# Patient Record
Sex: Female | Born: 1945
Health system: Southern US, Academic
[De-identification: ages and names within clinical notes are randomized; demographics above are authoritative.]

## PROBLEM LIST (undated history)

## (undated) ENCOUNTER — Encounter: Attending: Nephrology | Primary: Nephrology

## (undated) ENCOUNTER — Inpatient Hospital Stay

## (undated) ENCOUNTER — Encounter

## (undated) ENCOUNTER — Ambulatory Visit

## (undated) ENCOUNTER — Ambulatory Visit: Payer: MEDICARE

## (undated) ENCOUNTER — Telehealth

## (undated) ENCOUNTER — Ambulatory Visit: Payer: MEDICARE | Attending: Nephrology | Primary: Nephrology

## (undated) ENCOUNTER — Encounter: Attending: Infectious Disease | Primary: Infectious Disease

## (undated) ENCOUNTER — Encounter: Attending: "Endocrinology | Primary: "Endocrinology

## (undated) ENCOUNTER — Ambulatory Visit: Attending: Infectious Disease | Primary: Infectious Disease

## (undated) ENCOUNTER — Telehealth
Attending: Student in an Organized Health Care Education/Training Program | Primary: Student in an Organized Health Care Education/Training Program

## (undated) ENCOUNTER — Encounter: Attending: Adult Health | Primary: Adult Health

## (undated) ENCOUNTER — Institutional Professional Consult (permissible substitution): Payer: MEDICARE | Attending: Nephrology | Primary: Nephrology

## (undated) ENCOUNTER — Telehealth: Attending: Infectious Disease | Primary: Infectious Disease

## (undated) ENCOUNTER — Ambulatory Visit: Payer: MEDICARE | Attending: Orthopaedic Surgery | Primary: Orthopaedic Surgery

## (undated) DIAGNOSIS — J45909 Unspecified asthma, uncomplicated: Secondary | ICD-10-CM

## (undated) DIAGNOSIS — K08109 Complete loss of teeth, unspecified cause, unspecified class: Secondary | ICD-10-CM

## (undated) DIAGNOSIS — N289 Disorder of kidney and ureter, unspecified: Secondary | ICD-10-CM

## (undated) DIAGNOSIS — I10 Essential (primary) hypertension: Secondary | ICD-10-CM

## (undated) DIAGNOSIS — Z972 Presence of dental prosthetic device (complete) (partial): Secondary | ICD-10-CM

## (undated) DIAGNOSIS — E7800 Pure hypercholesterolemia, unspecified: Secondary | ICD-10-CM

## (undated) MED ORDER — ALENDRONATE 70 MG TABLET: 70 mg | tablet | 4 refills | 84 days

---
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
## 1996-10-19 HISTORY — PX: ABDOMINAL HYSTERECTOMY: SHX81

## 1996-10-19 HISTORY — PX: CARDIAC CATHETERIZATION: SHX172

## 2008-06-01 ENCOUNTER — Emergency Department: Payer: Self-pay | Admitting: Emergency Medicine

## 2009-06-19 IMAGING — CT CT MAXILLOFACIAL WITHOUT CONTRAST
1 of 2 series · 16 of 30 positions shown, 20 images · non-contrast
Comparison: none

REASON FOR EXAM: facial trauma
COMMENTS:

PROCEDURE:     CT  - CT MAXILLOFACIAL AREA WO  - June 01, 2008  [DATE]
RESULT:     Multiplanar imaging of the bones of the face was obtained
utilizing a bone algorithm.
There is no evidence of fracture, dislocation or malalignment.  The
paranasal sinuses are well aerated.

[Series 3: facial 3.0 h60f · axial · 0.34mm/px · z∈[+326,+460]mm · 16 of 52 slices shown, 20 images]
[im 4/52  brain]
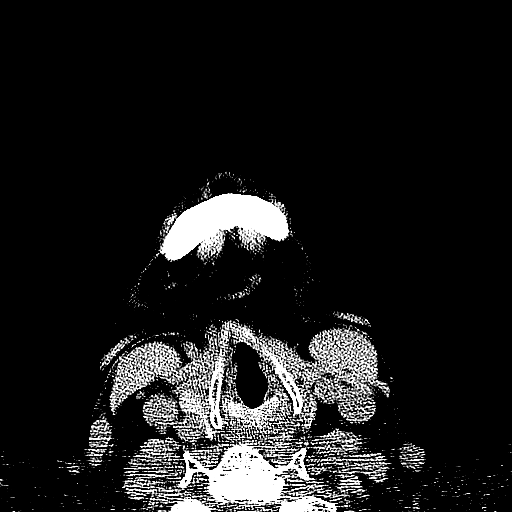
[im 4/52  bone]
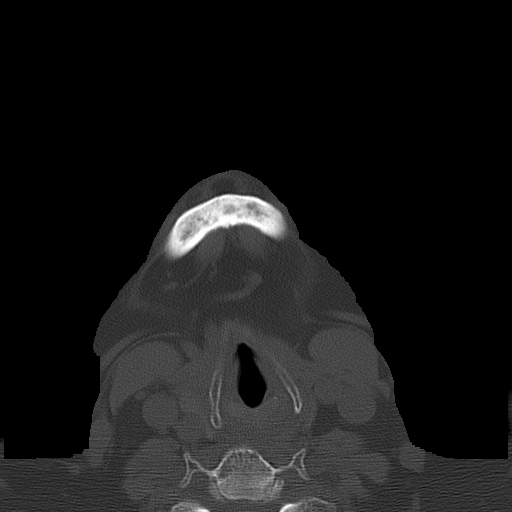
[im 7/52  bone]
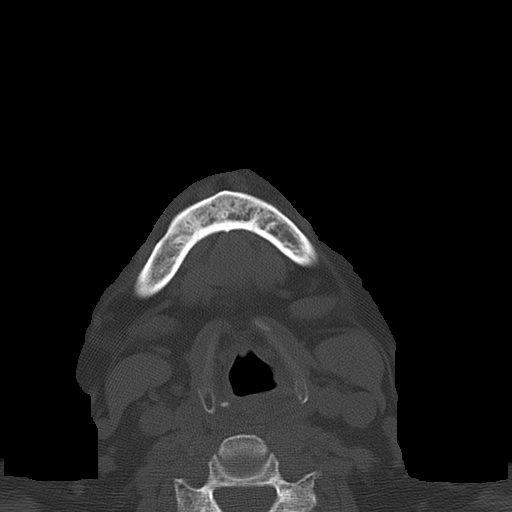
[im 10/52  bone]
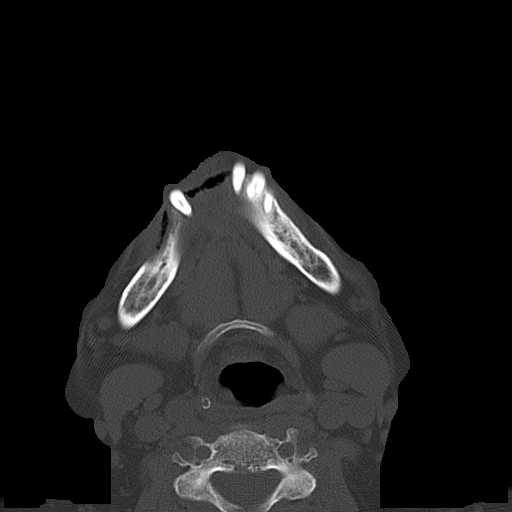
[im 13/52  bone]
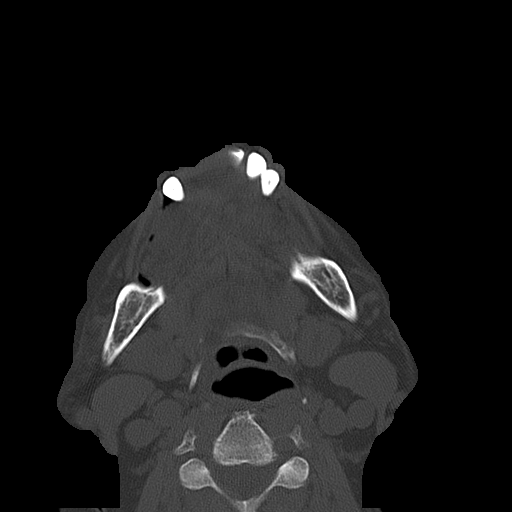
[im 16/52  brain]
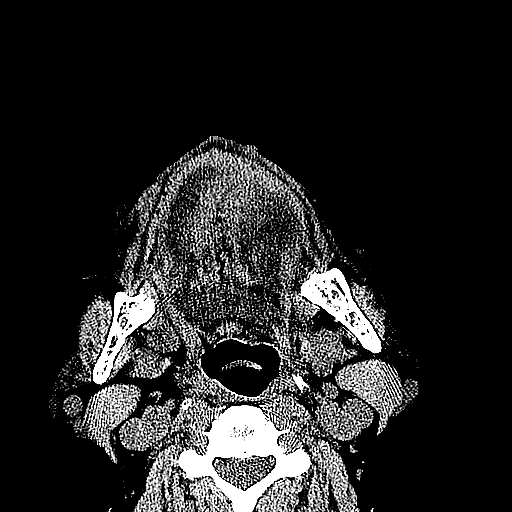
[im 16/52  bone]
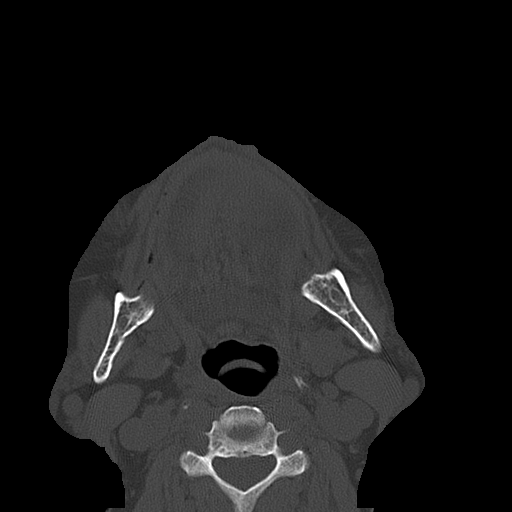
[im 19/52  bone]
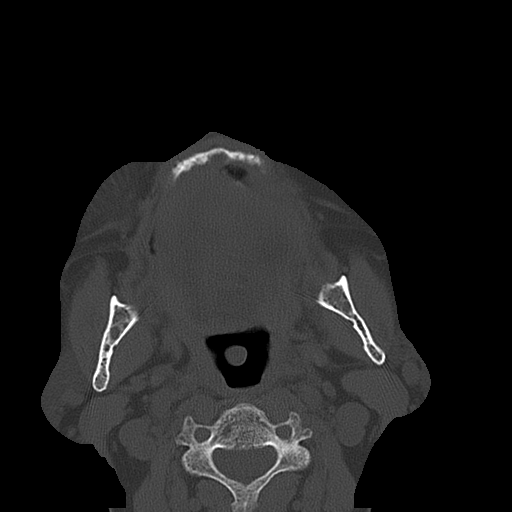
[im 22/52  bone]
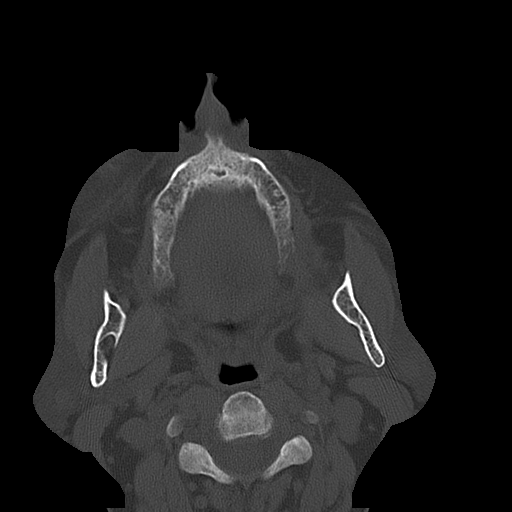
[im 25/52  bone]
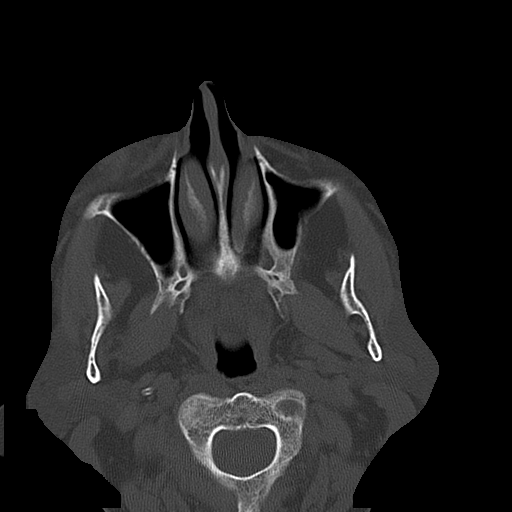
[im 28/52  brain]
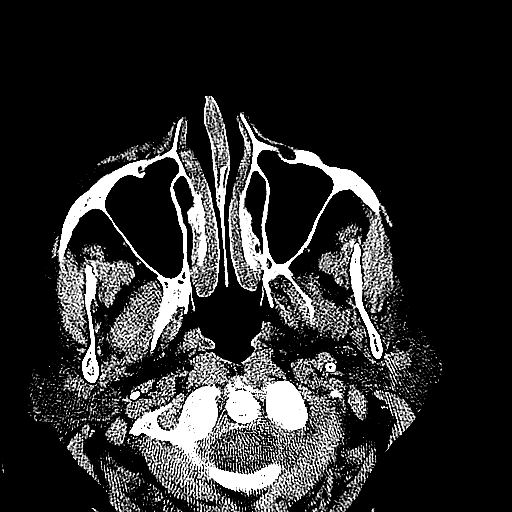
[im 28/52  bone]
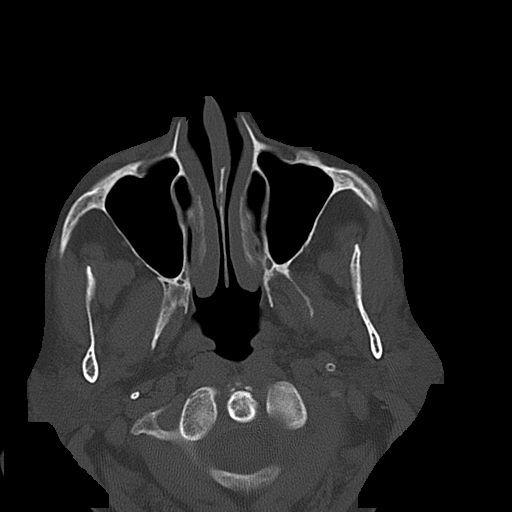
[im 31/52  bone]
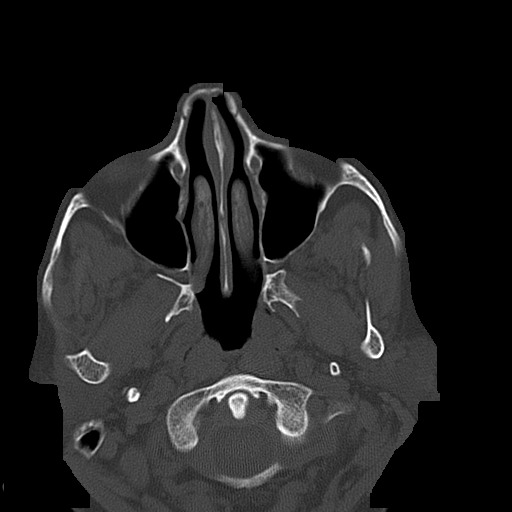
[im 34/52  bone]
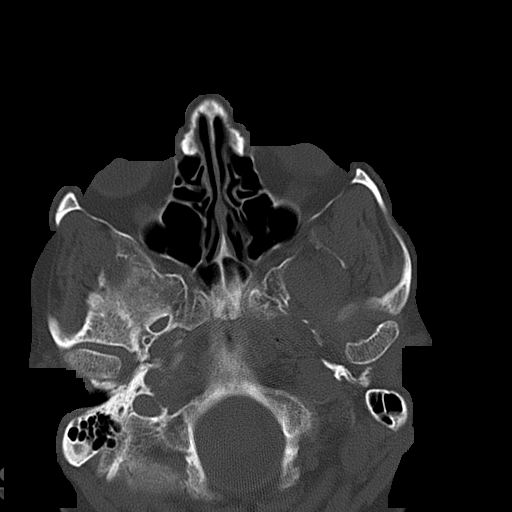
[im 37/52  bone]
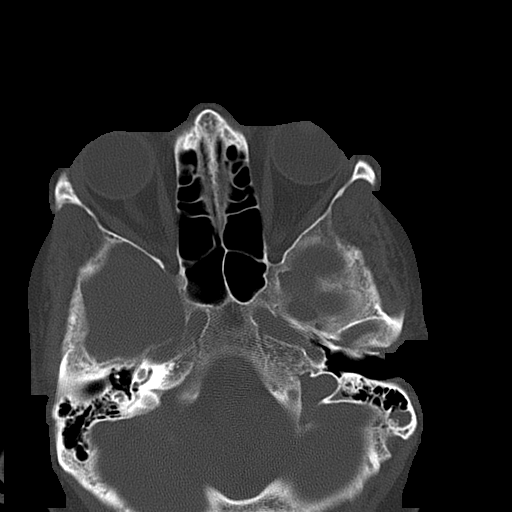
[im 40/52  brain]
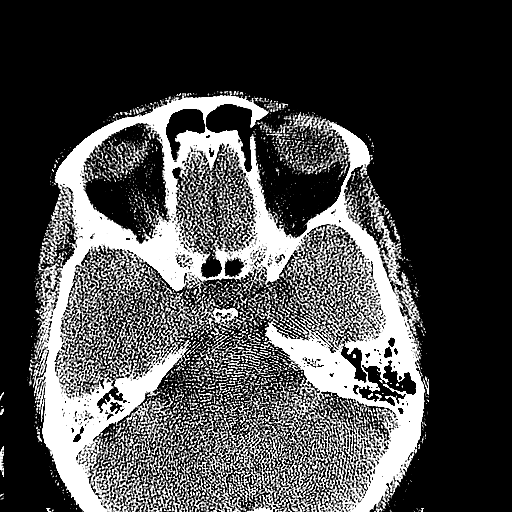
[im 40/52  bone]
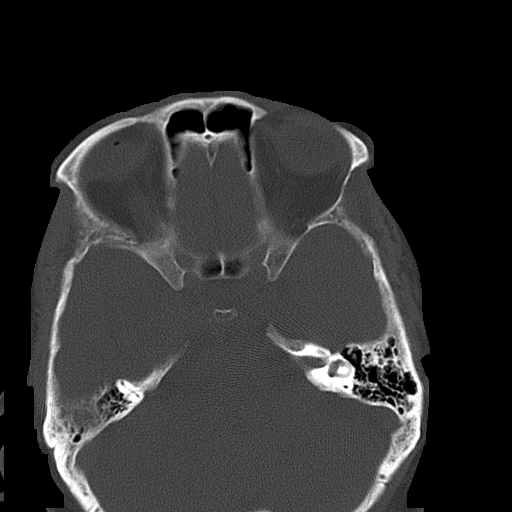
[im 43/52  bone]
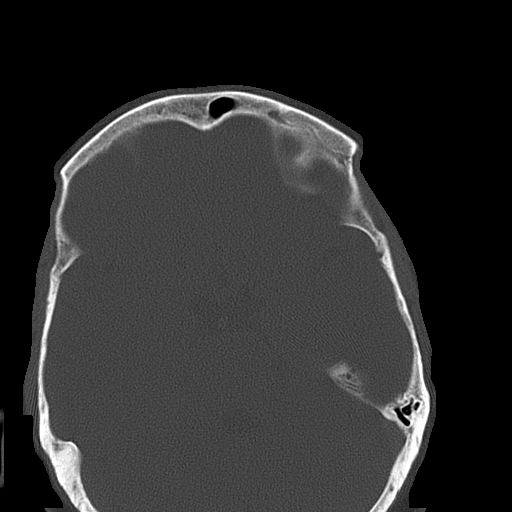
[im 46/52  bone]
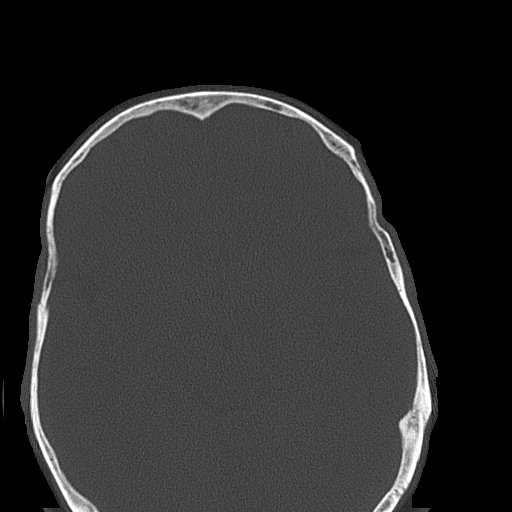
[im 49/52  bone]
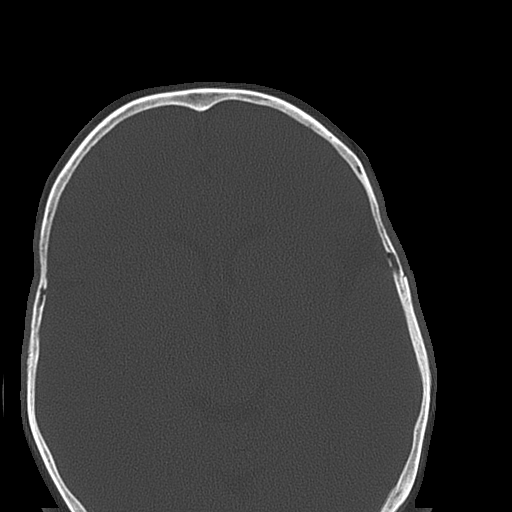

[16 of 30 positions shown; findings below may reference images not displayed]

IMPRESSION: 1.     No evidence of acute osseous abnormalities.
2.     Dr. Ci of the Emergency Department was informed of these
findings via preliminary fax report on 06/01/08 at [DATE] p.m. CST.

## 2010-09-22 ENCOUNTER — Emergency Department: Payer: Self-pay | Admitting: Emergency Medicine

## 2011-06-26 ENCOUNTER — Emergency Department: Payer: Self-pay | Admitting: Emergency Medicine

## 2013-05-17 HISTORY — PX: KIDNEY TRANSPLANT: SHX239

## 2013-06-05 ENCOUNTER — Other Ambulatory Visit: Payer: Self-pay | Admitting: Nephrology

## 2013-06-05 LAB — BASIC METABOLIC PANEL
Anion Gap: 9 (ref 7–16)
Calcium, Total: 8.9 mg/dL (ref 8.5–10.1)
Chloride: 107 mmol/L (ref 98–107)
Co2: 22 mmol/L (ref 21–32)
Creatinine: 0.55 mg/dL — ABNORMAL LOW (ref 0.60–1.30)
EGFR (Non-African Amer.): >60
Glucose: 92 mg/dL (ref 65–99)
Osmolality: 274 (ref 275–301)

## 2013-06-05 LAB — URINALYSIS, COMPLETE
Bacteria: NONE SEEN
Glucose,UR: NEGATIVE mg/dL (ref 0–75)
Leukocyte Esterase: NEGATIVE
Nitrite: NEGATIVE
RBC,UR: 6 /HPF (ref 0–5)
Specific Gravity: 1.011 (ref 1.003–1.030)
Squamous Epithelial: 1

## 2013-06-05 LAB — CBC WITH DIFFERENTIAL/PLATELET
Basophil #: 0 10*3/uL (ref 0.0–0.1)
Eosinophil #: 0 10*3/uL (ref 0.0–0.7)
Eosinophil %: 0.4 %
HGB: 11.1 g/dL — ABNORMAL LOW (ref 12.0–16.0)
Lymphocyte #: 0 10*3/uL — ABNORMAL LOW (ref 1.0–3.6)
MCH: 31.4 pg (ref 26.0–34.0)
MCHC: 34.3 g/dL (ref 32.0–36.0)
Neutrophil #: 4.8 10*3/uL (ref 1.4–6.5)
Neutrophil %: 90.9 %
RBC: 3.54 10*6/uL — ABNORMAL LOW (ref 3.80–5.20)
RDW: 15.4 % — ABNORMAL HIGH (ref 11.5–14.5)
WBC: 5.3 10*3/uL (ref 3.6–11.0)

## 2013-06-05 LAB — MAGNESIUM: Magnesium: 1.5 mg/dL — ABNORMAL LOW

## 2013-06-05 LAB — PHOSPHORUS: Phosphorus: 3.9 mg/dL (ref 2.5–4.9)

## 2013-06-07 LAB — BASIC METABOLIC PANEL
Anion Gap: 7 (ref 7–16)
Co2: 24 mmol/L (ref 21–32)
Creatinine: 0.76 mg/dL (ref 0.60–1.30)
EGFR (African American): >60
Glucose: 97 mg/dL (ref 65–99)
Osmolality: 277 (ref 275–301)

## 2013-06-07 LAB — CBC WITH DIFFERENTIAL/PLATELET
Basophil %: 1 %
Eosinophil #: 0 10*3/uL (ref 0.0–0.7)
Eosinophil %: 0.7 %
HGB: 11 g/dL — ABNORMAL LOW (ref 12.0–16.0)
Lymphocyte %: 1.7 %
MCH: 31.4 pg (ref 26.0–34.0)
MCHC: 34.1 g/dL (ref 32.0–36.0)
Monocyte #: 0.4 x10 3/mm (ref 0.2–0.9)
Neutrophil #: 3.3 10*3/uL (ref 1.4–6.5)
RDW: 16 % — ABNORMAL HIGH (ref 11.5–14.5)

## 2013-06-07 LAB — PHOSPHORUS: Phosphorus: 4 mg/dL (ref 2.5–4.9)

## 2013-06-09 LAB — BASIC METABOLIC PANEL
Anion Gap: 7 (ref 7–16)
Chloride: 106 mmol/L (ref 98–107)
Co2: 24 mmol/L (ref 21–32)
Creatinine: 0.8 mg/dL (ref 0.60–1.30)
EGFR (Non-African Amer.): >60
Glucose: 101 mg/dL — ABNORMAL HIGH (ref 65–99)
Osmolality: 275 (ref 275–301)
Potassium: 4.8 mmol/L (ref 3.5–5.1)
Sodium: 137 mmol/L (ref 136–145)

## 2013-06-09 LAB — PHOSPHORUS: Phosphorus: 4.9 mg/dL (ref 2.5–4.9)

## 2013-06-09 LAB — CBC WITH DIFFERENTIAL/PLATELET
Eosinophil #: 0 10*3/uL (ref 0.0–0.7)
Eosinophil %: 1.5 %
HCT: 34 % — ABNORMAL LOW (ref 35.0–47.0)
HGB: 11.4 g/dL — ABNORMAL LOW (ref 12.0–16.0)
Lymphocyte %: 1.5 %
MCHC: 33.7 g/dL (ref 32.0–36.0)
Monocyte #: 0.3 x10 3/mm (ref 0.2–0.9)
Monocyte %: 9 %
Neutrophil #: 2.9 10*3/uL (ref 1.4–6.5)
Neutrophil %: 87.2 %
Platelet: 335 10*3/uL (ref 150–440)
RBC: 3.68 10*6/uL — ABNORMAL LOW (ref 3.80–5.20)
RDW: 16.7 % — ABNORMAL HIGH (ref 11.5–14.5)

## 2013-06-12 LAB — CBC WITH DIFFERENTIAL/PLATELET
Basophil %: 1.1 %
Eosinophil %: 1.8 %
HCT: 34.6 % — ABNORMAL LOW (ref 35.0–47.0)
HGB: 12.1 g/dL (ref 12.0–16.0)
Lymphocyte %: 2.4 %
MCH: 31.6 pg (ref 26.0–34.0)
MCHC: 34.9 g/dL (ref 32.0–36.0)
Monocyte #: 0.4 x10 3/mm (ref 0.2–0.9)
Monocyte %: 13.2 %
Neutrophil #: 2.2 10*3/uL (ref 1.4–6.5)
Neutrophil %: 81.5 %
RDW: 16.8 % — ABNORMAL HIGH (ref 11.5–14.5)
WBC: 2.7 10*3/uL — ABNORMAL LOW (ref 3.6–11.0)

## 2013-06-12 LAB — BASIC METABOLIC PANEL
Anion Gap: 6 — ABNORMAL LOW (ref 7–16)
BUN: 13 mg/dL (ref 7–18)
Calcium, Total: 9.3 mg/dL (ref 8.5–10.1)
Chloride: 105 mmol/L (ref 98–107)
Co2: 25 mmol/L (ref 21–32)
Creatinine: 1.07 mg/dL (ref 0.60–1.30)
EGFR (African American): >60
EGFR (Non-African Amer.): 53 — ABNORMAL LOW
Glucose: 94 mg/dL (ref 65–99)
Osmolality: 272 (ref 275–301)
Potassium: 4.9 mmol/L (ref 3.5–5.1)
Sodium: 136 mmol/L (ref 136–145)

## 2013-06-19 ENCOUNTER — Other Ambulatory Visit: Payer: Self-pay | Admitting: Nephrology

## 2013-06-22 LAB — BASIC METABOLIC PANEL
Anion Gap: 7 (ref 7–16)
BUN: 16 mg/dL (ref 7–18)
Calcium, Total: 9.3 mg/dL (ref 8.5–10.1)
Chloride: 105 mmol/L (ref 98–107)
Co2: 24 mmol/L (ref 21–32)
Creatinine: 1.47 mg/dL — ABNORMAL HIGH (ref 0.60–1.30)
EGFR (African American): 42 — ABNORMAL LOW
EGFR (Non-African Amer.): 36 — ABNORMAL LOW
Glucose: 101 mg/dL — ABNORMAL HIGH (ref 65–99)
Osmolality: 273 (ref 275–301)

## 2013-06-22 LAB — CBC WITH DIFFERENTIAL/PLATELET
Basophil #: 0 10*3/uL (ref 0.0–0.1)
Eosinophil %: 2.1 %
HGB: 11.5 g/dL — ABNORMAL LOW (ref 12.0–16.0)
Lymphocyte #: 0 10*3/uL — ABNORMAL LOW (ref 1.0–3.6)
Lymphocyte %: 3 %
MCH: 30.6 pg (ref 26.0–34.0)
MCHC: 34 g/dL (ref 32.0–36.0)
Monocyte #: 0.2 x10 3/mm (ref 0.2–0.9)
Monocyte %: 14 %
Neutrophil %: 79 %
Platelet: 239 10*3/uL (ref 150–440)
RBC: 3.75 10*6/uL — ABNORMAL LOW (ref 3.80–5.20)
WBC: 1.5 10*3/uL — CL (ref 3.6–11.0)

## 2013-06-26 LAB — BASIC METABOLIC PANEL
Chloride: 106 mmol/L (ref 98–107)
Co2: 25 mmol/L (ref 21–32)
Creatinine: 1.35 mg/dL — ABNORMAL HIGH (ref 0.60–1.30)
EGFR (Non-African Amer.): 40 — ABNORMAL LOW
Glucose: 102 mg/dL — ABNORMAL HIGH (ref 65–99)
Potassium: 4.1 mmol/L (ref 3.5–5.1)
Sodium: 137 mmol/L (ref 136–145)

## 2013-06-26 LAB — CBC WITH DIFFERENTIAL/PLATELET
Eosinophil #: 0 10*3/uL (ref 0.0–0.7)
Eosinophil %: 4.3 %
HCT: 37.1 % (ref 35.0–47.0)
Lymphocyte %: 5.1 %
MCH: 30.9 pg (ref 26.0–34.0)
Monocyte #: 0.3 x10 3/mm (ref 0.2–0.9)
Monocyte %: 26.5 %
Neutrophil %: 61.6 %
Platelet: 243 10*3/uL (ref 150–440)
RDW: 15.8 % — ABNORMAL HIGH (ref 11.5–14.5)

## 2013-06-26 LAB — PHOSPHORUS: Phosphorus: 5 mg/dL — ABNORMAL HIGH (ref 2.5–4.9)

## 2013-06-29 LAB — BASIC METABOLIC PANEL
Creatinine: 1.19 mg/dL (ref 0.60–1.30)
EGFR (African American): 54 — ABNORMAL LOW
EGFR (Non-African Amer.): 47 — ABNORMAL LOW
Glucose: 104 mg/dL — ABNORMAL HIGH (ref 65–99)
Osmolality: 276 (ref 275–301)
Potassium: 4.6 mmol/L (ref 3.5–5.1)

## 2013-06-29 LAB — MAGNESIUM: Magnesium: 1.6 mg/dL — ABNORMAL LOW

## 2013-06-29 LAB — CBC WITH DIFFERENTIAL/PLATELET
Eosinophil #: 0.1 10*3/uL (ref 0.0–0.7)
Monocyte #: 0.3 x10 3/mm (ref 0.2–0.9)
Neutrophil %: 57.5 %
Platelet: 248 10*3/uL (ref 150–440)
RDW: 15.6 % — ABNORMAL HIGH (ref 11.5–14.5)
WBC: 1.1 10*3/uL — CL (ref 3.6–11.0)

## 2013-07-06 LAB — CBC WITH DIFFERENTIAL/PLATELET
Basophil #: 0 10*3/uL (ref 0.0–0.1)
Basophil %: 1.4 %
Eosinophil #: 0.1 10*3/uL (ref 0.0–0.7)
Eosinophil %: 4.1 %
HCT: 36.6 % (ref 35.0–47.0)
Lymphocyte #: 0.1 10*3/uL — ABNORMAL LOW (ref 1.0–3.6)
Lymphocyte %: 3.6 %
MCH: 30.9 pg (ref 26.0–34.0)
Monocyte %: 24.5 %
Neutrophil #: 1.6 10*3/uL (ref 1.4–6.5)
Neutrophil %: 66.4 %
RDW: 15.6 % — ABNORMAL HIGH (ref 11.5–14.5)
WBC: 2.5 10*3/uL — ABNORMAL LOW (ref 3.6–11.0)

## 2013-07-06 LAB — PHOSPHORUS: Phosphorus: 4.3 mg/dL (ref 2.5–4.9)

## 2013-07-06 LAB — BASIC METABOLIC PANEL
BUN: 14 mg/dL (ref 7–18)
Creatinine: 1.17 mg/dL (ref 0.60–1.30)
Glucose: 105 mg/dL — ABNORMAL HIGH (ref 65–99)
Potassium: 3.6 mmol/L (ref 3.5–5.1)

## 2013-07-10 LAB — CBC WITH DIFFERENTIAL/PLATELET
Basophil #: 0 10*3/uL (ref 0.0–0.1)
Eosinophil #: 0.1 10*3/uL (ref 0.0–0.7)
Eosinophil %: 4.4 %
HCT: 36.8 % (ref 35.0–47.0)
Lymphocyte %: 2.2 %
MCH: 31.1 pg (ref 26.0–34.0)
MCHC: 34.6 g/dL (ref 32.0–36.0)
Monocyte #: 1 x10 3/mm — ABNORMAL HIGH (ref 0.2–0.9)
RBC: 4.08 10*6/uL (ref 3.80–5.20)
WBC: 2.9 10*3/uL — ABNORMAL LOW (ref 3.6–11.0)

## 2013-07-10 LAB — PHOSPHORUS: Phosphorus: 4.7 mg/dL (ref 2.5–4.9)

## 2013-07-10 LAB — BASIC METABOLIC PANEL
Anion Gap: 6 — ABNORMAL LOW (ref 7–16)
BUN: 17 mg/dL (ref 7–18)
Creatinine: 1.21 mg/dL (ref 0.60–1.30)
Glucose: 102 mg/dL — ABNORMAL HIGH (ref 65–99)
Osmolality: 277 (ref 275–301)
Potassium: 3.8 mmol/L (ref 3.5–5.1)
Sodium: 138 mmol/L (ref 136–145)

## 2013-07-10 LAB — MAGNESIUM: Magnesium: 1.9 mg/dL

## 2013-07-13 LAB — CBC WITH DIFFERENTIAL/PLATELET
Eosinophil %: 5.3 %
Lymphocyte %: 6.4 %
MCV: 90 fL (ref 80–100)
Monocyte %: 31.4 %
Neutrophil #: 1.3 10*3/uL — ABNORMAL LOW (ref 1.4–6.5)
RBC: 3.84 10*6/uL (ref 3.80–5.20)
RDW: 15.6 % — ABNORMAL HIGH (ref 11.5–14.5)
WBC: 2.3 10*3/uL — ABNORMAL LOW (ref 3.6–11.0)

## 2013-07-13 LAB — MAGNESIUM: Magnesium: 1.7 mg/dL — ABNORMAL LOW

## 2013-07-13 LAB — BASIC METABOLIC PANEL
BUN: 14 mg/dL (ref 7–18)
Calcium, Total: 9.3 mg/dL (ref 8.5–10.1)
Chloride: 106 mmol/L (ref 98–107)
Creatinine: 1.26 mg/dL (ref 0.60–1.30)
EGFR (Non-African Amer.): 44 — ABNORMAL LOW
Osmolality: 278 (ref 275–301)
Potassium: 3.7 mmol/L (ref 3.5–5.1)
Sodium: 139 mmol/L (ref 136–145)

## 2013-07-13 LAB — PHOSPHORUS: Phosphorus: 4.6 mg/dL (ref 2.5–4.9)

## 2013-07-19 ENCOUNTER — Other Ambulatory Visit: Payer: Self-pay | Admitting: Nephrology

## 2013-07-24 LAB — CBC WITH DIFFERENTIAL/PLATELET
Basophil #: 0 10*3/uL (ref 0.0–0.1)
Eosinophil #: 0.2 10*3/uL (ref 0.0–0.7)
Eosinophil %: 8.7 %
Lymphocyte #: 0.2 10*3/uL — ABNORMAL LOW (ref 1.0–3.6)
Lymphocyte %: 11.5 %
MCHC: 34.3 g/dL (ref 32.0–36.0)
MCV: 90 fL (ref 80–100)
Monocyte %: 15.3 %
Neutrophil %: 62.4 %
Platelet: 259 10*3/uL (ref 150–440)
RDW: 15 % — ABNORMAL HIGH (ref 11.5–14.5)
WBC: 1.7 10*3/uL — CL (ref 3.6–11.0)

## 2013-07-24 LAB — BASIC METABOLIC PANEL
Anion Gap: 5 — ABNORMAL LOW (ref 7–16)
BUN: 25 mg/dL — ABNORMAL HIGH (ref 7–18)
Co2: 26 mmol/L (ref 21–32)
Creatinine: 1.59 mg/dL — ABNORMAL HIGH (ref 0.60–1.30)
EGFR (Non-African Amer.): 33 — ABNORMAL LOW
Osmolality: 276 (ref 275–301)
Potassium: 4.4 mmol/L (ref 3.5–5.1)
Sodium: 136 mmol/L (ref 136–145)

## 2013-07-24 LAB — MAGNESIUM: Magnesium: 1.5 mg/dL — ABNORMAL LOW

## 2013-07-24 LAB — PHOSPHORUS: Phosphorus: 4.3 mg/dL (ref 2.5–4.9)

## 2013-07-31 LAB — CBC WITH DIFFERENTIAL/PLATELET
HCT: 37.9 % (ref 35.0–47.0)
HGB: 13 g/dL (ref 12.0–16.0)
Lymphocyte #: 0.2 10*3/uL — ABNORMAL LOW (ref 1.0–3.6)
Lymphocyte %: 12.3 %
MCH: 30.5 pg (ref 26.0–34.0)
MCHC: 34.2 g/dL (ref 32.0–36.0)
MCV: 89 fL (ref 80–100)
Monocyte #: 0.3 x10 3/mm (ref 0.2–0.9)
Monocyte %: 21.5 %
Neutrophil %: 56 %
RDW: 15.1 % — ABNORMAL HIGH (ref 11.5–14.5)
WBC: 1.5 10*3/uL — CL (ref 3.6–11.0)

## 2013-07-31 LAB — BASIC METABOLIC PANEL
Anion Gap: 8 (ref 7–16)
BUN: 14 mg/dL (ref 7–18)
Calcium, Total: 9.6 mg/dL (ref 8.5–10.1)
Co2: 26 mmol/L (ref 21–32)
Creatinine: 1.18 mg/dL (ref 0.60–1.30)
EGFR (African American): 55 — ABNORMAL LOW
Glucose: 98 mg/dL (ref 65–99)
Osmolality: 274 (ref 275–301)
Potassium: 3.9 mmol/L (ref 3.5–5.1)

## 2013-07-31 LAB — PHOSPHORUS: Phosphorus: 5.1 mg/dL — ABNORMAL HIGH (ref 2.5–4.9)

## 2013-08-07 LAB — CBC WITH DIFFERENTIAL/PLATELET
Basophil #: 0 10*3/uL (ref 0.0–0.1)
Eosinophil #: 0.2 10*3/uL (ref 0.0–0.7)
Eosinophil %: 11.3 %
HCT: 36.8 % (ref 35.0–47.0)
HGB: 12.4 g/dL (ref 12.0–16.0)
Lymphocyte #: 0.2 10*3/uL — ABNORMAL LOW (ref 1.0–3.6)
Lymphocyte %: 14.6 %
MCH: 30.5 pg (ref 26.0–34.0)
MCHC: 33.8 g/dL (ref 32.0–36.0)
MCV: 90 fL (ref 80–100)
Monocyte #: 0.5 x10 3/mm (ref 0.2–0.9)
Monocyte %: 34.5 %
Neutrophil #: 0.6 10*3/uL — ABNORMAL LOW (ref 1.4–6.5)
Neutrophil %: 37.3 %
Platelet: 256 10*3/uL (ref 150–440)
RBC: 4.09 10*6/uL (ref 3.80–5.20)
RDW: 14.9 % — ABNORMAL HIGH (ref 11.5–14.5)
WBC: 1.6 10*3/uL — CL (ref 3.6–11.0)

## 2013-08-07 LAB — BASIC METABOLIC PANEL
Anion Gap: 5 — ABNORMAL LOW (ref 7–16)
Chloride: 107 mmol/L (ref 98–107)
Co2: 26 mmol/L (ref 21–32)
Creatinine: 1.18 mg/dL (ref 0.60–1.30)
EGFR (African American): 55 — ABNORMAL LOW
Glucose: 98 mg/dL (ref 65–99)
Sodium: 138 mmol/L (ref 136–145)

## 2013-08-07 LAB — MAGNESIUM: Magnesium: 1.8 mg/dL

## 2013-08-19 ENCOUNTER — Other Ambulatory Visit: Payer: Self-pay | Admitting: Nephrology

## 2013-08-21 LAB — BASIC METABOLIC PANEL
Chloride: 101 mmol/L (ref 98–107)
EGFR (African American): 55 — ABNORMAL LOW
EGFR (Non-African Amer.): 47 — ABNORMAL LOW
Glucose: 98 mg/dL (ref 65–99)
Sodium: 133 mmol/L — ABNORMAL LOW (ref 136–145)

## 2013-08-21 LAB — CBC WITH DIFFERENTIAL/PLATELET
Eosinophil %: 8 %
HCT: 36.8 % (ref 35.0–47.0)
HGB: 12.6 g/dL (ref 12.0–16.0)
Lymphocyte %: 15.2 %
MCH: 30.3 pg (ref 26.0–34.0)
MCV: 89 fL (ref 80–100)
Neutrophil #: 0.6 10*3/uL — ABNORMAL LOW (ref 1.4–6.5)
Platelet: 328 10*3/uL (ref 150–440)
RBC: 4.16 10*6/uL (ref 3.80–5.20)
RDW: 14.2 % (ref 11.5–14.5)

## 2013-08-21 LAB — PHOSPHORUS: Phosphorus: 5.1 mg/dL — ABNORMAL HIGH (ref 2.5–4.9)

## 2013-08-28 LAB — BASIC METABOLIC PANEL
BUN: 19 mg/dL — ABNORMAL HIGH (ref 7–18)
Calcium, Total: 9.1 mg/dL (ref 8.5–10.1)
Chloride: 106 mmol/L (ref 98–107)
Co2: 27 mmol/L (ref 21–32)
Creatinine: 1.07 mg/dL (ref 0.60–1.30)
EGFR (Non-African Amer.): 53 — ABNORMAL LOW
Osmolality: 278 (ref 275–301)
Sodium: 138 mmol/L (ref 136–145)

## 2013-08-28 LAB — CBC WITH DIFFERENTIAL/PLATELET
Basophil %: 3.2 %
HGB: 12.1 g/dL (ref 12.0–16.0)
Lymphocyte #: 0.4 10*3/uL — ABNORMAL LOW (ref 1.0–3.6)
Lymphocyte %: 22 %
MCH: 30.2 pg (ref 26.0–34.0)
MCV: 88 fL (ref 80–100)
Monocyte %: 37.1 %
Neutrophil #: 0.5 10*3/uL — ABNORMAL LOW (ref 1.4–6.5)
Platelet: 277 10*3/uL (ref 150–440)
RBC: 3.99 10*6/uL (ref 3.80–5.20)
RDW: 14.3 % (ref 11.5–14.5)
WBC: 1.7 10*3/uL — CL (ref 3.6–11.0)

## 2013-08-28 LAB — MAGNESIUM: Magnesium: 1.4 mg/dL — ABNORMAL LOW

## 2013-09-04 LAB — CBC WITH DIFFERENTIAL/PLATELET
Basophil #: 0 10*3/uL (ref 0.0–0.1)
Eosinophil #: 0.2 10*3/uL (ref 0.0–0.7)
Eosinophil %: 6.3 %
HCT: 36.2 % (ref 35.0–47.0)
HGB: 12.1 g/dL (ref 12.0–16.0)
Lymphocyte #: 0.4 10*3/uL — ABNORMAL LOW (ref 1.0–3.6)
Lymphocyte %: 13.8 %
MCH: 29.8 pg (ref 26.0–34.0)
MCV: 89 fL (ref 80–100)
Monocyte #: 1.1 x10 3/mm — ABNORMAL HIGH (ref 0.2–0.9)
Neutrophil %: 37.7 %
RBC: 4.08 10*6/uL (ref 3.80–5.20)
RDW: 14.4 % (ref 11.5–14.5)
WBC: 2.7 10*3/uL — ABNORMAL LOW (ref 3.6–11.0)

## 2013-09-04 LAB — BASIC METABOLIC PANEL
BUN: 20 mg/dL — ABNORMAL HIGH (ref 7–18)
Calcium, Total: 9.1 mg/dL (ref 8.5–10.1)
Co2: 25 mmol/L (ref 21–32)
Creatinine: 1.28 mg/dL (ref 0.60–1.30)
EGFR (African American): 50 — ABNORMAL LOW
Glucose: 102 mg/dL — ABNORMAL HIGH (ref 65–99)
Potassium: 4.5 mmol/L (ref 3.5–5.1)

## 2013-09-04 LAB — PHOSPHORUS: Phosphorus: 4.8 mg/dL (ref 2.5–4.9)

## 2013-09-04 LAB — MAGNESIUM: Magnesium: 1.9 mg/dL

## 2013-09-11 LAB — BASIC METABOLIC PANEL
Calcium, Total: 9.3 mg/dL (ref 8.5–10.1)
Chloride: 106 mmol/L (ref 98–107)
Creatinine: 1.14 mg/dL (ref 0.60–1.30)
EGFR (African American): 57 — ABNORMAL LOW
EGFR (Non-African Amer.): 49 — ABNORMAL LOW
Glucose: 100 mg/dL — ABNORMAL HIGH (ref 65–99)
Potassium: 4 mmol/L (ref 3.5–5.1)
Sodium: 140 mmol/L (ref 136–145)

## 2013-09-11 LAB — CBC WITH DIFFERENTIAL/PLATELET
Basophil #: 0.1 10*3/uL (ref 0.0–0.1)
Basophil %: 1.4 %
Eosinophil #: 0.2 10*3/uL (ref 0.0–0.7)
Eosinophil %: 3.7 %
HGB: 11.8 g/dL — ABNORMAL LOW (ref 12.0–16.0)
Lymphocyte %: 7.1 %
Monocyte #: 0.5 x10 3/mm (ref 0.2–0.9)
WBC: 5.3 10*3/uL (ref 3.6–11.0)

## 2013-09-11 LAB — MAGNESIUM: Magnesium: 1.8 mg/dL

## 2013-09-11 LAB — PHOSPHORUS: Phosphorus: 4.4 mg/dL (ref 2.5–4.9)

## 2013-09-18 ENCOUNTER — Other Ambulatory Visit: Payer: Self-pay | Admitting: Nephrology

## 2013-09-18 LAB — CBC WITH DIFFERENTIAL/PLATELET
Basophil #: 0 10*3/uL (ref 0.0–0.1)
Basophil %: 1.3 %
Eosinophil %: 6.9 %
HGB: 11.5 g/dL — ABNORMAL LOW (ref 12.0–16.0)
Lymphocyte %: 11.9 %
MCHC: 32.9 g/dL (ref 32.0–36.0)
MCV: 88 fL (ref 80–100)
Monocyte %: 16.8 %
Neutrophil #: 2.1 10*3/uL (ref 1.4–6.5)
Neutrophil %: 63.1 %
RBC: 3.98 10*6/uL (ref 3.80–5.20)
WBC: 3.3 10*3/uL — ABNORMAL LOW (ref 3.6–11.0)

## 2013-09-18 LAB — COMPREHENSIVE METABOLIC PANEL
Alkaline Phosphatase: 93 U/L
BUN: 13 mg/dL (ref 7–18)
Calcium, Total: 9.1 mg/dL (ref 8.5–10.1)
Chloride: 106 mmol/L (ref 98–107)
Co2: 26 mmol/L (ref 21–32)
EGFR (Non-African Amer.): 51 — ABNORMAL LOW
Glucose: 110 mg/dL — ABNORMAL HIGH (ref 65–99)
Osmolality: 280 (ref 275–301)
Sodium: 140 mmol/L (ref 136–145)
Total Protein: 6.6 g/dL (ref 6.4–8.2)

## 2013-09-18 LAB — LIPID PANEL
Cholesterol: 113 mg/dL (ref 0–200)
HDL Cholesterol: 48 mg/dL (ref 40–60)
Ldl Cholesterol, Calc: 53 mg/dL (ref 0–100)
VLDL Cholesterol, Calc: 12 mg/dL (ref 5–40)

## 2013-09-18 LAB — MAGNESIUM: Magnesium: 1.6 mg/dL — ABNORMAL LOW

## 2013-09-18 LAB — BILIRUBIN, DIRECT: Bilirubin, Direct: 0.2 mg/dL (ref 0.00–0.20)

## 2013-09-25 LAB — CBC WITH DIFFERENTIAL/PLATELET
Eosinophil #: 0.3 10*3/uL (ref 0.0–0.7)
Eosinophil %: 9.4 %
HGB: 12.2 g/dL (ref 12.0–16.0)
Lymphocyte #: 0.4 10*3/uL — ABNORMAL LOW (ref 1.0–3.6)
Lymphocyte %: 12.6 %
MCH: 29.5 pg (ref 26.0–34.0)
Monocyte #: 0.7 x10 3/mm (ref 0.2–0.9)
Monocyte %: 22.8 %
Neutrophil #: 1.6 10*3/uL (ref 1.4–6.5)
Neutrophil %: 51.9 %
Platelet: 284 10*3/uL (ref 150–440)
RDW: 14.2 % (ref 11.5–14.5)
WBC: 3.1 10*3/uL — ABNORMAL LOW (ref 3.6–11.0)

## 2013-09-25 LAB — COMPREHENSIVE METABOLIC PANEL
Albumin: 4 g/dL (ref 3.4–5.0)
Anion Gap: 7 (ref 7–16)
BUN: 12 mg/dL (ref 7–18)
Calcium, Total: 9.6 mg/dL (ref 8.5–10.1)
Chloride: 103 mmol/L (ref 98–107)
Creatinine: 1.11 mg/dL (ref 0.60–1.30)
EGFR (African American): 59 — ABNORMAL LOW
EGFR (Non-African Amer.): 51 — ABNORMAL LOW
Glucose: 108 mg/dL — ABNORMAL HIGH (ref 65–99)
Potassium: 4 mmol/L (ref 3.5–5.1)
SGOT(AST): 13 U/L — ABNORMAL LOW (ref 15–37)
SGPT (ALT): 20 U/L (ref 12–78)

## 2013-09-25 LAB — MAGNESIUM: Magnesium: 1.9 mg/dL

## 2013-09-25 LAB — LIPID PANEL
Ldl Cholesterol, Calc: 59 mg/dL (ref 0–100)
Triglycerides: 67 mg/dL (ref 0–200)
VLDL Cholesterol, Calc: 13 mg/dL (ref 5–40)

## 2013-09-25 LAB — PHOSPHORUS: Phosphorus: 5 mg/dL — ABNORMAL HIGH (ref 2.5–4.9)

## 2013-10-02 LAB — CBC WITH DIFFERENTIAL/PLATELET
Basophil #: 0 10*3/uL (ref 0.0–0.1)
Eosinophil #: 0.1 10*3/uL (ref 0.0–0.7)
Eosinophil %: 4 %
HGB: 11.7 g/dL — ABNORMAL LOW (ref 12.0–16.0)
Lymphocyte #: 0.4 10*3/uL — ABNORMAL LOW (ref 1.0–3.6)
Lymphocyte %: 11.6 %
MCH: 28.3 pg (ref 26.0–34.0)
MCHC: 32.4 g/dL (ref 32.0–36.0)
MCV: 87 fL (ref 80–100)
Monocyte #: 0.7 x10 3/mm (ref 0.2–0.9)
Monocyte %: 21.1 %
Platelet: 238 10*3/uL (ref 150–440)
RDW: 14.1 % (ref 11.5–14.5)

## 2013-10-02 LAB — BASIC METABOLIC PANEL
Anion Gap: 5 — ABNORMAL LOW (ref 7–16)
Co2: 27 mmol/L (ref 21–32)
EGFR (African American): 53 — ABNORMAL LOW
EGFR (Non-African Amer.): 45 — ABNORMAL LOW
Glucose: 104 mg/dL — ABNORMAL HIGH (ref 65–99)
Osmolality: 278 (ref 275–301)
Sodium: 138 mmol/L (ref 136–145)

## 2013-10-02 LAB — PHOSPHORUS: Phosphorus: 3.8 mg/dL (ref 2.5–4.9)

## 2013-10-19 ENCOUNTER — Other Ambulatory Visit: Payer: Self-pay | Admitting: Nephrology

## 2013-10-23 LAB — CBC WITH DIFFERENTIAL/PLATELET
BASOS ABS: 0 10*3/uL (ref 0.0–0.1)
Basophil %: 0.3 %
Eosinophil #: 0.2 10*3/uL (ref 0.0–0.7)
Eosinophil %: 3.7 %
HCT: 37 % (ref 35.0–47.0)
HGB: 12.3 g/dL (ref 12.0–16.0)
Lymphocyte #: 0.2 10*3/uL — ABNORMAL LOW (ref 1.0–3.6)
Lymphocyte %: 5.8 %
MCH: 28.9 pg (ref 26.0–34.0)
MCHC: 33.3 g/dL (ref 32.0–36.0)
MCV: 87 fL (ref 80–100)
Monocyte #: 0.5 x10 3/mm (ref 0.2–0.9)
Monocyte %: 12.4 %
NEUTROS ABS: 3.2 10*3/uL (ref 1.4–6.5)
NEUTROS PCT: 77.8 %
PLATELETS: 275 10*3/uL (ref 150–440)
RBC: 4.27 10*6/uL (ref 3.80–5.20)
RDW: 14.4 % (ref 11.5–14.5)
WBC: 4.1 10*3/uL (ref 3.6–11.0)

## 2013-10-23 LAB — BASIC METABOLIC PANEL
ANION GAP: 5 — AB (ref 7–16)
BUN: 17 mg/dL (ref 7–18)
CALCIUM: 9.3 mg/dL (ref 8.5–10.1)
CHLORIDE: 105 mmol/L (ref 98–107)
Co2: 27 mmol/L (ref 21–32)
Creatinine: 1.3 mg/dL (ref 0.60–1.30)
EGFR (Non-African Amer.): 42 — ABNORMAL LOW
GFR CALC AF AMER: 49 — AB
Glucose: 86 mg/dL (ref 65–99)
Osmolality: 275 (ref 275–301)
Potassium: 4.5 mmol/L (ref 3.5–5.1)
Sodium: 137 mmol/L (ref 136–145)

## 2013-10-23 LAB — MAGNESIUM: Magnesium: 1.9 mg/dL

## 2013-10-23 LAB — PHOSPHORUS: PHOSPHORUS: 4.3 mg/dL (ref 2.5–4.9)

## 2013-10-30 LAB — MAGNESIUM: MAGNESIUM: 1.9 mg/dL

## 2013-10-30 LAB — BASIC METABOLIC PANEL
ANION GAP: 6 — AB (ref 7–16)
BUN: 14 mg/dL (ref 7–18)
CREATININE: 1.19 mg/dL (ref 0.60–1.30)
Calcium, Total: 9.3 mg/dL (ref 8.5–10.1)
Chloride: 104 mmol/L (ref 98–107)
Co2: 26 mmol/L (ref 21–32)
GFR CALC AF AMER: 54 — AB
GFR CALC NON AF AMER: 47 — AB
GLUCOSE: 89 mg/dL (ref 65–99)
Osmolality: 272 (ref 275–301)
POTASSIUM: 4 mmol/L (ref 3.5–5.1)
Sodium: 136 mmol/L (ref 136–145)

## 2013-10-30 LAB — CBC WITH DIFFERENTIAL/PLATELET
Basophil #: 0 10*3/uL (ref 0.0–0.1)
Basophil %: 1.2 %
Eosinophil #: 0.2 10*3/uL (ref 0.0–0.7)
Eosinophil %: 4.9 %
HCT: 35.2 % (ref 35.0–47.0)
HGB: 12.3 g/dL (ref 12.0–16.0)
LYMPHS ABS: 0.5 10*3/uL — AB (ref 1.0–3.6)
LYMPHS PCT: 14.6 %
MCH: 30 pg (ref 26.0–34.0)
MCHC: 34.9 g/dL (ref 32.0–36.0)
MCV: 86 fL (ref 80–100)
Monocyte #: 0.7 x10 3/mm (ref 0.2–0.9)
Monocyte %: 18.9 %
NEUTROS ABS: 2.1 10*3/uL (ref 1.4–6.5)
NEUTROS PCT: 60.4 %
PLATELETS: 262 10*3/uL (ref 150–440)
RBC: 4.1 10*6/uL (ref 3.80–5.20)
RDW: 14.4 % (ref 11.5–14.5)
WBC: 3.5 10*3/uL — AB (ref 3.6–11.0)

## 2013-10-30 LAB — PHOSPHORUS: PHOSPHORUS: 4.7 mg/dL (ref 2.5–4.9)

## 2013-11-06 LAB — BASIC METABOLIC PANEL
ANION GAP: 5 — AB (ref 7–16)
BUN: 24 mg/dL — ABNORMAL HIGH (ref 7–18)
Calcium, Total: 9.6 mg/dL (ref 8.5–10.1)
Chloride: 104 mmol/L (ref 98–107)
Co2: 27 mmol/L (ref 21–32)
Creatinine: 1.33 mg/dL — ABNORMAL HIGH (ref 0.60–1.30)
EGFR (African American): 47 — ABNORMAL LOW
EGFR (Non-African Amer.): 41 — ABNORMAL LOW
Glucose: 110 mg/dL — ABNORMAL HIGH (ref 65–99)
Osmolality: 277 (ref 275–301)
Potassium: 4.5 mmol/L (ref 3.5–5.1)
SODIUM: 136 mmol/L (ref 136–145)

## 2013-11-06 LAB — CBC WITH DIFFERENTIAL/PLATELET
BASOS PCT: 0.8 %
Basophil #: 0 10*3/uL (ref 0.0–0.1)
Eosinophil #: 0.1 10*3/uL (ref 0.0–0.7)
Eosinophil %: 4.4 %
HCT: 36.5 % (ref 35.0–47.0)
HGB: 12.1 g/dL (ref 12.0–16.0)
LYMPHS ABS: 0.5 10*3/uL — AB (ref 1.0–3.6)
Lymphocyte %: 14.8 %
MCH: 29.2 pg (ref 26.0–34.0)
MCHC: 33.3 g/dL (ref 32.0–36.0)
MCV: 88 fL (ref 80–100)
MONOS PCT: 21.8 %
Monocyte #: 0.7 x10 3/mm (ref 0.2–0.9)
Neutrophil #: 1.9 10*3/uL (ref 1.4–6.5)
Neutrophil %: 58.2 %
Platelet: 281 10*3/uL (ref 150–440)
RBC: 4.16 10*6/uL (ref 3.80–5.20)
RDW: 14.4 % (ref 11.5–14.5)
WBC: 3.3 10*3/uL — ABNORMAL LOW (ref 3.6–11.0)

## 2013-11-06 LAB — MAGNESIUM: Magnesium: 2.3 mg/dL

## 2013-11-06 LAB — PHOSPHORUS: Phosphorus: 4.8 mg/dL (ref 2.5–4.9)

## 2013-11-13 LAB — CBC WITH DIFFERENTIAL/PLATELET
Basophil #: 0 10*3/uL (ref 0.0–0.1)
Basophil %: 1.3 %
EOS PCT: 6.2 %
Eosinophil #: 0.2 10*3/uL (ref 0.0–0.7)
HCT: 36.7 % (ref 35.0–47.0)
HGB: 12 g/dL (ref 12.0–16.0)
LYMPHS PCT: 17.5 %
Lymphocyte #: 0.6 10*3/uL — ABNORMAL LOW (ref 1.0–3.6)
MCH: 29.1 pg (ref 26.0–34.0)
MCHC: 32.8 g/dL (ref 32.0–36.0)
MCV: 89 fL (ref 80–100)
MONO ABS: 0.7 x10 3/mm (ref 0.2–0.9)
Monocyte %: 20.6 %
NEUTROS ABS: 1.9 10*3/uL (ref 1.4–6.5)
Neutrophil %: 54.4 %
Platelet: 234 10*3/uL (ref 150–440)
RBC: 4.14 10*6/uL (ref 3.80–5.20)
RDW: 14.8 % — ABNORMAL HIGH (ref 11.5–14.5)
WBC: 3.5 10*3/uL — ABNORMAL LOW (ref 3.6–11.0)

## 2013-11-13 LAB — BASIC METABOLIC PANEL
ANION GAP: 6 — AB (ref 7–16)
BUN: 16 mg/dL (ref 7–18)
CHLORIDE: 108 mmol/L — AB (ref 98–107)
CO2: 25 mmol/L (ref 21–32)
CREATININE: 1.4 mg/dL — AB (ref 0.60–1.30)
Calcium, Total: 9.2 mg/dL (ref 8.5–10.1)
GFR CALC AF AMER: 45 — AB
GFR CALC NON AF AMER: 39 — AB
Glucose: 102 mg/dL — ABNORMAL HIGH (ref 65–99)
Osmolality: 279 (ref 275–301)
Potassium: 4 mmol/L (ref 3.5–5.1)
Sodium: 139 mmol/L (ref 136–145)

## 2013-11-13 LAB — MAGNESIUM: Magnesium: 1.8 mg/dL

## 2013-11-13 LAB — PHOSPHORUS: PHOSPHORUS: 4.3 mg/dL (ref 2.5–4.9)

## 2013-11-19 ENCOUNTER — Other Ambulatory Visit: Payer: Self-pay | Admitting: Nephrology

## 2013-11-20 LAB — CBC WITH DIFFERENTIAL/PLATELET
BASOS ABS: 0 10*3/uL (ref 0.0–0.1)
Basophil %: 1.3 %
EOS PCT: 5.8 %
Eosinophil #: 0.2 10*3/uL (ref 0.0–0.7)
HCT: 35 % (ref 35.0–47.0)
HGB: 11.7 g/dL — AB (ref 12.0–16.0)
LYMPHS ABS: 0.5 10*3/uL — AB (ref 1.0–3.6)
Lymphocyte %: 19 %
MCH: 29.4 pg (ref 26.0–34.0)
MCHC: 33.5 g/dL (ref 32.0–36.0)
MCV: 88 fL (ref 80–100)
MONOS PCT: 24.6 %
Monocyte #: 0.7 x10 3/mm (ref 0.2–0.9)
Neutrophil #: 1.4 10*3/uL (ref 1.4–6.5)
Neutrophil %: 49.3 %
PLATELETS: 238 10*3/uL (ref 150–440)
RBC: 3.99 10*6/uL (ref 3.80–5.20)
RDW: 14.8 % — AB (ref 11.5–14.5)
WBC: 2.8 10*3/uL — AB (ref 3.6–11.0)

## 2013-11-20 LAB — BASIC METABOLIC PANEL
Anion Gap: 3 — ABNORMAL LOW (ref 7–16)
BUN: 16 mg/dL (ref 7–18)
CALCIUM: 9.5 mg/dL (ref 8.5–10.1)
CHLORIDE: 108 mmol/L — AB (ref 98–107)
CO2: 27 mmol/L (ref 21–32)
Creatinine: 1.3 mg/dL (ref 0.60–1.30)
GFR CALC AF AMER: 49 — AB
GFR CALC NON AF AMER: 42 — AB
GLUCOSE: 98 mg/dL (ref 65–99)
Osmolality: 277 (ref 275–301)
POTASSIUM: 4.2 mmol/L (ref 3.5–5.1)
Sodium: 138 mmol/L (ref 136–145)

## 2013-11-20 LAB — MAGNESIUM: MAGNESIUM: 1.7 mg/dL — AB

## 2013-11-20 LAB — PHOSPHORUS: Phosphorus: 4.6 mg/dL (ref 2.5–4.9)

## 2013-11-27 LAB — CBC WITH DIFFERENTIAL/PLATELET
Basophil #: 0 10*3/uL (ref 0.0–0.1)
Basophil %: 1.8 %
Eosinophil #: 0.2 10*3/uL (ref 0.0–0.7)
Eosinophil %: 6.2 %
HCT: 34.3 % — ABNORMAL LOW (ref 35.0–47.0)
HGB: 11.5 g/dL — ABNORMAL LOW (ref 12.0–16.0)
LYMPHS PCT: 20.6 %
Lymphocyte #: 0.6 10*3/uL — ABNORMAL LOW (ref 1.0–3.6)
MCH: 29.4 pg (ref 26.0–34.0)
MCHC: 33.5 g/dL (ref 32.0–36.0)
MCV: 88 fL (ref 80–100)
MONOS PCT: 26.7 %
Monocyte #: 0.7 x10 3/mm (ref 0.2–0.9)
NEUTROS PCT: 44.7 %
Neutrophil #: 1.2 10*3/uL — ABNORMAL LOW (ref 1.4–6.5)
PLATELETS: 233 10*3/uL (ref 150–440)
RBC: 3.92 10*6/uL (ref 3.80–5.20)
RDW: 14.8 % — ABNORMAL HIGH (ref 11.5–14.5)
WBC: 2.8 10*3/uL — ABNORMAL LOW (ref 3.6–11.0)

## 2013-11-27 LAB — MAGNESIUM: Magnesium: 2.1 mg/dL

## 2013-11-27 LAB — BASIC METABOLIC PANEL
ANION GAP: 6 — AB (ref 7–16)
BUN: 18 mg/dL (ref 7–18)
CALCIUM: 9.3 mg/dL (ref 8.5–10.1)
Chloride: 103 mmol/L (ref 98–107)
Co2: 26 mmol/L (ref 21–32)
Creatinine: 1.43 mg/dL — ABNORMAL HIGH (ref 0.60–1.30)
EGFR (African American): 43 — ABNORMAL LOW
EGFR (Non-African Amer.): 38 — ABNORMAL LOW
GLUCOSE: 101 mg/dL — AB (ref 65–99)
Osmolality: 272 (ref 275–301)
POTASSIUM: 4.1 mmol/L (ref 3.5–5.1)
SODIUM: 135 mmol/L — AB (ref 136–145)

## 2013-11-27 LAB — PHOSPHORUS: Phosphorus: 4.8 mg/dL (ref 2.5–4.9)

## 2013-12-11 LAB — CBC WITH DIFFERENTIAL/PLATELET
BASOS ABS: 0 10*3/uL (ref 0.0–0.1)
Basophil %: 1.3 %
Eosinophil #: 0.2 10*3/uL (ref 0.0–0.7)
Eosinophil %: 5.1 %
HCT: 36.7 % (ref 35.0–47.0)
HGB: 12.1 g/dL (ref 12.0–16.0)
LYMPHS PCT: 13.9 %
Lymphocyte #: 0.5 10*3/uL — ABNORMAL LOW (ref 1.0–3.6)
MCH: 28.7 pg (ref 26.0–34.0)
MCHC: 33 g/dL (ref 32.0–36.0)
MCV: 87 fL (ref 80–100)
Monocyte #: 0.8 x10 3/mm (ref 0.2–0.9)
Monocyte %: 21.2 %
NEUTROS ABS: 2.2 10*3/uL (ref 1.4–6.5)
Neutrophil %: 58.5 %
Platelet: 249 10*3/uL (ref 150–440)
RBC: 4.22 10*6/uL (ref 3.80–5.20)
RDW: 14.8 % — ABNORMAL HIGH (ref 11.5–14.5)
WBC: 3.7 10*3/uL (ref 3.6–11.0)

## 2013-12-11 LAB — PHOSPHORUS: PHOSPHORUS: 4.7 mg/dL (ref 2.5–4.9)

## 2013-12-11 LAB — BASIC METABOLIC PANEL
ANION GAP: 4 — AB (ref 7–16)
BUN: 23 mg/dL — ABNORMAL HIGH (ref 7–18)
CALCIUM: 9.1 mg/dL (ref 8.5–10.1)
CO2: 28 mmol/L (ref 21–32)
Chloride: 108 mmol/L — ABNORMAL HIGH (ref 98–107)
Creatinine: 1.33 mg/dL — ABNORMAL HIGH (ref 0.60–1.30)
EGFR (African American): 47 — ABNORMAL LOW
EGFR (Non-African Amer.): 41 — ABNORMAL LOW
Glucose: 98 mg/dL (ref 65–99)
OSMOLALITY: 283 (ref 275–301)
POTASSIUM: 4.1 mmol/L (ref 3.5–5.1)
Sodium: 140 mmol/L (ref 136–145)

## 2013-12-11 LAB — MAGNESIUM: MAGNESIUM: 1.6 mg/dL — AB

## 2013-12-17 ENCOUNTER — Other Ambulatory Visit: Payer: Self-pay | Admitting: Nephrology

## 2013-12-18 LAB — COMPREHENSIVE METABOLIC PANEL
Albumin: 3.6 g/dL (ref 3.4–5.0)
Alkaline Phosphatase: 104 U/L
Anion Gap: 9 (ref 7–16)
BILIRUBIN TOTAL: 0.5 mg/dL (ref 0.2–1.0)
BUN: 9 mg/dL (ref 7–18)
CALCIUM: 9.1 mg/dL (ref 8.5–10.1)
Chloride: 104 mmol/L (ref 98–107)
Co2: 23 mmol/L (ref 21–32)
Creatinine: 1.12 mg/dL (ref 0.60–1.30)
GFR CALC AF AMER: 58 — AB
GFR CALC NON AF AMER: 50 — AB
GLUCOSE: 130 mg/dL — AB (ref 65–99)
Osmolality: 272 (ref 275–301)
POTASSIUM: 3.8 mmol/L (ref 3.5–5.1)
SGOT(AST): 7 U/L — ABNORMAL LOW (ref 15–37)
SGPT (ALT): 12 U/L (ref 12–78)
SODIUM: 136 mmol/L (ref 136–145)
TOTAL PROTEIN: 6.6 g/dL (ref 6.4–8.2)

## 2013-12-18 LAB — CBC WITH DIFFERENTIAL/PLATELET
BASOS PCT: 1.3 %
Basophil #: 0.1 10*3/uL (ref 0.0–0.1)
Eosinophil #: 0.3 10*3/uL (ref 0.0–0.7)
Eosinophil %: 7.2 %
HCT: 36.2 % (ref 35.0–47.0)
HGB: 12.1 g/dL (ref 12.0–16.0)
LYMPHS ABS: 0.7 10*3/uL — AB (ref 1.0–3.6)
LYMPHS PCT: 16 %
MCH: 28.5 pg (ref 26.0–34.0)
MCHC: 33.3 g/dL (ref 32.0–36.0)
MCV: 86 fL (ref 80–100)
Monocyte #: 1 x10 3/mm — ABNORMAL HIGH (ref 0.2–0.9)
Monocyte %: 22.8 %
NEUTROS ABS: 2.2 10*3/uL (ref 1.4–6.5)
Neutrophil %: 52.7 %
PLATELETS: 275 10*3/uL (ref 150–440)
RBC: 4.23 10*6/uL (ref 3.80–5.20)
RDW: 14.2 % (ref 11.5–14.5)
WBC: 4.2 10*3/uL (ref 3.6–11.0)

## 2013-12-18 LAB — LIPID PANEL
CHOLESTEROL: 103 mg/dL (ref 0–200)
HDL: 32 mg/dL — AB (ref 40–60)
Ldl Cholesterol, Calc: 54 mg/dL (ref 0–100)
Triglycerides: 86 mg/dL (ref 0–200)
VLDL Cholesterol, Calc: 17 mg/dL (ref 5–40)

## 2013-12-18 LAB — MAGNESIUM: Magnesium: 1.5 mg/dL — ABNORMAL LOW

## 2013-12-18 LAB — BILIRUBIN, DIRECT

## 2013-12-18 LAB — GAMMA GT: GGT: 12 U/L (ref 5–85)

## 2013-12-18 LAB — PHOSPHORUS: PHOSPHORUS: 4.1 mg/dL (ref 2.5–4.9)

## 2013-12-25 LAB — CBC WITH DIFFERENTIAL/PLATELET
BASOS PCT: 2.4 %
Basophil #: 0.1 10*3/uL (ref 0.0–0.1)
EOS ABS: 0.2 10*3/uL (ref 0.0–0.7)
EOS PCT: 6.8 %
HCT: 35.7 % (ref 35.0–47.0)
HGB: 12 g/dL (ref 12.0–16.0)
LYMPHS ABS: 0.5 10*3/uL — AB (ref 1.0–3.6)
Lymphocyte %: 15.5 %
MCH: 28.8 pg (ref 26.0–34.0)
MCHC: 33.7 g/dL (ref 32.0–36.0)
MCV: 86 fL (ref 80–100)
MONOS PCT: 18 %
Monocyte #: 0.6 x10 3/mm (ref 0.2–0.9)
NEUTROS ABS: 1.9 10*3/uL (ref 1.4–6.5)
Neutrophil %: 57.3 %
Platelet: 299 10*3/uL (ref 150–440)
RBC: 4.17 10*6/uL (ref 3.80–5.20)
RDW: 14.7 % — ABNORMAL HIGH (ref 11.5–14.5)
WBC: 3.3 10*3/uL — ABNORMAL LOW (ref 3.6–11.0)

## 2013-12-25 LAB — BASIC METABOLIC PANEL
ANION GAP: 6 — AB (ref 7–16)
BUN: 15 mg/dL (ref 7–18)
CALCIUM: 9.3 mg/dL (ref 8.5–10.1)
Chloride: 103 mmol/L (ref 98–107)
Co2: 27 mmol/L (ref 21–32)
Creatinine: 1.19 mg/dL (ref 0.60–1.30)
EGFR (African American): 54 — ABNORMAL LOW
GFR CALC NON AF AMER: 47 — AB
GLUCOSE: 105 mg/dL — AB (ref 65–99)
OSMOLALITY: 273 (ref 275–301)
Potassium: 3.9 mmol/L (ref 3.5–5.1)
SODIUM: 136 mmol/L (ref 136–145)

## 2013-12-25 LAB — PHOSPHORUS: Phosphorus: 4.4 mg/dL (ref 2.5–4.9)

## 2013-12-25 LAB — MAGNESIUM: MAGNESIUM: 1.6 mg/dL — AB

## 2014-01-01 LAB — BASIC METABOLIC PANEL
ANION GAP: 4 — AB (ref 7–16)
BUN: 12 mg/dL (ref 7–18)
CREATININE: 1.22 mg/dL (ref 0.60–1.30)
Calcium, Total: 9.2 mg/dL (ref 8.5–10.1)
Chloride: 107 mmol/L (ref 98–107)
Co2: 29 mmol/L (ref 21–32)
EGFR (Non-African Amer.): 45 — ABNORMAL LOW
GFR CALC AF AMER: 53 — AB
Glucose: 96 mg/dL (ref 65–99)
Osmolality: 279 (ref 275–301)
Potassium: 4.3 mmol/L (ref 3.5–5.1)
Sodium: 140 mmol/L (ref 136–145)

## 2014-01-01 LAB — CBC WITH DIFFERENTIAL/PLATELET
Basophil #: 0.1 10*3/uL (ref 0.0–0.1)
Basophil %: 2.3 %
EOS PCT: 10.6 %
Eosinophil #: 0.3 10*3/uL (ref 0.0–0.7)
HCT: 37.2 % (ref 35.0–47.0)
HGB: 12.1 g/dL (ref 12.0–16.0)
Lymphocyte #: 0.7 10*3/uL — ABNORMAL LOW (ref 1.0–3.6)
Lymphocyte %: 26.4 %
MCH: 28.2 pg (ref 26.0–34.0)
MCHC: 32.5 g/dL (ref 32.0–36.0)
MCV: 87 fL (ref 80–100)
MONOS PCT: 19.8 %
Monocyte #: 0.5 x10 3/mm (ref 0.2–0.9)
Neutrophil #: 1 10*3/uL — ABNORMAL LOW (ref 1.4–6.5)
Neutrophil %: 40.9 %
PLATELETS: 282 10*3/uL (ref 150–440)
RBC: 4.29 10*6/uL (ref 3.80–5.20)
RDW: 14.6 % — ABNORMAL HIGH (ref 11.5–14.5)
WBC: 2.5 10*3/uL — AB (ref 3.6–11.0)

## 2014-01-01 LAB — PHOSPHORUS: Phosphorus: 4.6 mg/dL (ref 2.5–4.9)

## 2014-01-01 LAB — MAGNESIUM: Magnesium: 1.6 mg/dL — ABNORMAL LOW

## 2014-01-15 LAB — BASIC METABOLIC PANEL
Anion Gap: 6 — ABNORMAL LOW (ref 7–16)
BUN: 10 mg/dL (ref 7–18)
CALCIUM: 9 mg/dL (ref 8.5–10.1)
CO2: 28 mmol/L (ref 21–32)
CREATININE: 1.22 mg/dL (ref 0.60–1.30)
Chloride: 106 mmol/L (ref 98–107)
EGFR (African American): 53 — ABNORMAL LOW
EGFR (Non-African Amer.): 45 — ABNORMAL LOW
Glucose: 93 mg/dL (ref 65–99)
Osmolality: 278 (ref 275–301)
Potassium: 3.9 mmol/L (ref 3.5–5.1)
Sodium: 140 mmol/L (ref 136–145)

## 2014-01-15 LAB — PHOSPHORUS: Phosphorus: 4.1 mg/dL (ref 2.5–4.9)

## 2014-01-15 LAB — CBC WITH DIFFERENTIAL/PLATELET
Basophil #: 0.1 10*3/uL (ref 0.0–0.1)
Basophil %: 1.9 %
EOS ABS: 0.3 10*3/uL (ref 0.0–0.7)
EOS PCT: 8.5 %
HCT: 36.9 % (ref 35.0–47.0)
HGB: 12.2 g/dL (ref 12.0–16.0)
LYMPHS ABS: 0.7 10*3/uL — AB (ref 1.0–3.6)
Lymphocyte %: 22.5 %
MCH: 28.3 pg (ref 26.0–34.0)
MCHC: 33.1 g/dL (ref 32.0–36.0)
MCV: 86 fL (ref 80–100)
MONO ABS: 0.8 x10 3/mm (ref 0.2–0.9)
Monocyte %: 25.3 %
Neutrophil #: 1.3 10*3/uL — ABNORMAL LOW (ref 1.4–6.5)
Neutrophil %: 41.8 %
PLATELETS: 255 10*3/uL (ref 150–440)
RBC: 4.31 10*6/uL (ref 3.80–5.20)
RDW: 14.5 % (ref 11.5–14.5)
WBC: 3.2 10*3/uL — ABNORMAL LOW (ref 3.6–11.0)

## 2014-01-15 LAB — MAGNESIUM: MAGNESIUM: 1.5 mg/dL — AB

## 2014-01-17 ENCOUNTER — Other Ambulatory Visit: Payer: Self-pay | Admitting: Nephrology

## 2014-01-29 LAB — BASIC METABOLIC PANEL
Anion Gap: 6 — ABNORMAL LOW (ref 7–16)
BUN: 17 mg/dL (ref 7–18)
CALCIUM: 9.3 mg/dL (ref 8.5–10.1)
CHLORIDE: 103 mmol/L (ref 98–107)
CO2: 28 mmol/L (ref 21–32)
CREATININE: 1.27 mg/dL (ref 0.60–1.30)
EGFR (African American): 50 — ABNORMAL LOW
GFR CALC NON AF AMER: 43 — AB
Glucose: 116 mg/dL — ABNORMAL HIGH (ref 65–99)
Osmolality: 276 (ref 275–301)
POTASSIUM: 3.6 mmol/L (ref 3.5–5.1)
Sodium: 137 mmol/L (ref 136–145)

## 2014-01-29 LAB — CBC WITH DIFFERENTIAL/PLATELET
BASOS ABS: 0 10*3/uL (ref 0.0–0.1)
Basophil %: 1.4 %
EOS PCT: 7.2 %
Eosinophil #: 0.2 10*3/uL (ref 0.0–0.7)
HCT: 36.8 % (ref 35.0–47.0)
HGB: 11.8 g/dL — AB (ref 12.0–16.0)
Lymphocyte #: 0.6 10*3/uL — ABNORMAL LOW (ref 1.0–3.6)
Lymphocyte %: 21.9 %
MCH: 27.2 pg (ref 26.0–34.0)
MCHC: 32.1 g/dL (ref 32.0–36.0)
MCV: 85 fL (ref 80–100)
Monocyte #: 0.6 x10 3/mm (ref 0.2–0.9)
Monocyte %: 23.3 %
Neutrophil #: 1.3 10*3/uL — ABNORMAL LOW (ref 1.4–6.5)
Neutrophil %: 46.2 %
PLATELETS: 289 10*3/uL (ref 150–440)
RBC: 4.34 10*6/uL (ref 3.80–5.20)
RDW: 14.2 % (ref 11.5–14.5)
WBC: 2.8 10*3/uL — ABNORMAL LOW (ref 3.6–11.0)

## 2014-01-29 LAB — MAGNESIUM: Magnesium: 1.6 mg/dL — ABNORMAL LOW

## 2014-01-29 LAB — PHOSPHORUS: Phosphorus: 3.7 mg/dL (ref 2.5–4.9)

## 2014-02-16 ENCOUNTER — Other Ambulatory Visit: Payer: Self-pay | Admitting: Nephrology

## 2014-02-19 LAB — CBC WITH DIFFERENTIAL/PLATELET
Basophil #: 0 10*3/uL (ref 0.0–0.1)
Basophil %: 1.3 %
EOS ABS: 0.2 10*3/uL (ref 0.0–0.7)
Eosinophil %: 9.1 %
HCT: 38 % (ref 35.0–47.0)
HGB: 12 g/dL (ref 12.0–16.0)
LYMPHS PCT: 20.8 %
Lymphocyte #: 0.6 10*3/uL — ABNORMAL LOW (ref 1.0–3.6)
MCH: 27 pg (ref 26.0–34.0)
MCHC: 31.7 g/dL — ABNORMAL LOW (ref 32.0–36.0)
MCV: 85 fL (ref 80–100)
Monocyte #: 0.5 x10 3/mm (ref 0.2–0.9)
Monocyte %: 18 %
NEUTROS ABS: 1.4 10*3/uL (ref 1.4–6.5)
Neutrophil %: 50.8 %
Platelet: 281 10*3/uL (ref 150–440)
RBC: 4.45 10*6/uL (ref 3.80–5.20)
RDW: 14.6 % — ABNORMAL HIGH (ref 11.5–14.5)
WBC: 2.7 10*3/uL — ABNORMAL LOW (ref 3.6–11.0)

## 2014-02-19 LAB — BASIC METABOLIC PANEL
Anion Gap: 6 — ABNORMAL LOW (ref 7–16)
BUN: 14 mg/dL (ref 7–18)
CHLORIDE: 104 mmol/L (ref 98–107)
CO2: 28 mmol/L (ref 21–32)
Calcium, Total: 9.7 mg/dL (ref 8.5–10.1)
Creatinine: 1.07 mg/dL (ref 0.60–1.30)
EGFR (African American): >60
GFR CALC NON AF AMER: 53 — AB
Glucose: 112 mg/dL — ABNORMAL HIGH (ref 65–99)
OSMOLALITY: 277 (ref 275–301)
Potassium: 4.2 mmol/L (ref 3.5–5.1)
SODIUM: 138 mmol/L (ref 136–145)

## 2014-02-19 LAB — MAGNESIUM: Magnesium: 1.7 mg/dL — ABNORMAL LOW

## 2014-02-19 LAB — PHOSPHORUS: Phosphorus: 3.8 mg/dL (ref 2.5–4.9)

## 2014-03-19 ENCOUNTER — Other Ambulatory Visit: Payer: Self-pay | Admitting: Nephrology

## 2014-04-18 ENCOUNTER — Other Ambulatory Visit: Payer: Self-pay | Admitting: Nephrology

## 2014-04-18 LAB — CBC WITH DIFFERENTIAL/PLATELET
Basophil #: 0.1 10*3/uL (ref 0.0–0.1)
Basophil %: 3.9 %
Eosinophil #: 0.2 10*3/uL (ref 0.0–0.7)
Eosinophil %: 6.5 %
HCT: 37.2 % (ref 35.0–47.0)
HGB: 12.2 g/dL (ref 12.0–16.0)
LYMPHS ABS: 0.7 10*3/uL — AB (ref 1.0–3.6)
Lymphocyte %: 29.4 %
MCH: 27.8 pg (ref 26.0–34.0)
MCHC: 32.9 g/dL (ref 32.0–36.0)
MCV: 84 fL (ref 80–100)
MONO ABS: 0.6 x10 3/mm (ref 0.2–0.9)
Monocyte %: 23.3 %
Neutrophil #: 0.9 10*3/uL — ABNORMAL LOW (ref 1.4–6.5)
Neutrophil %: 36.9 %
Platelet: 243 10*3/uL (ref 150–440)
RBC: 4.41 10*6/uL (ref 3.80–5.20)
RDW: 14.6 % — ABNORMAL HIGH (ref 11.5–14.5)
WBC: 2.4 10*3/uL — ABNORMAL LOW (ref 3.6–11.0)

## 2014-04-18 LAB — COMPREHENSIVE METABOLIC PANEL
ALT: 17 U/L (ref 12–78)
ANION GAP: 6 — AB (ref 7–16)
Albumin: 4.1 g/dL (ref 3.4–5.0)
Alkaline Phosphatase: 91 U/L
BILIRUBIN TOTAL: 0.7 mg/dL (ref 0.2–1.0)
BUN: 14 mg/dL (ref 7–18)
Calcium, Total: 9.4 mg/dL (ref 8.5–10.1)
Chloride: 106 mmol/L (ref 98–107)
Co2: 27 mmol/L (ref 21–32)
Creatinine: 1.28 mg/dL (ref 0.60–1.30)
EGFR (Non-African Amer.): 43 — ABNORMAL LOW
GFR CALC AF AMER: 49 — AB
GLUCOSE: 96 mg/dL (ref 65–99)
Osmolality: 278 (ref 275–301)
Potassium: 4.3 mmol/L (ref 3.5–5.1)
SGOT(AST): 13 U/L — ABNORMAL LOW (ref 15–37)
Sodium: 139 mmol/L (ref 136–145)
Total Protein: 6.6 g/dL (ref 6.4–8.2)

## 2014-04-18 LAB — GAMMA GT: GGT: 13 U/L (ref 5–85)

## 2014-04-18 LAB — LIPID PANEL
Cholesterol: 118 mg/dL (ref 0–200)
HDL Cholesterol: 41 mg/dL (ref 40–60)
Ldl Cholesterol, Calc: 58 mg/dL (ref 0–100)
Triglycerides: 95 mg/dL (ref 0–200)
VLDL CHOLESTEROL, CALC: 19 mg/dL (ref 5–40)

## 2014-04-18 LAB — MAGNESIUM: MAGNESIUM: 1.7 mg/dL — AB

## 2014-04-18 LAB — PHOSPHORUS: Phosphorus: 3.9 mg/dL (ref 2.5–4.9)

## 2014-05-19 ENCOUNTER — Other Ambulatory Visit: Payer: Self-pay | Admitting: Nephrology

## 2014-06-11 LAB — CBC WITH DIFFERENTIAL/PLATELET
Basophil #: 0 10*3/uL (ref 0.0–0.1)
Basophil %: 1.3 %
Eosinophil #: 0.1 10*3/uL (ref 0.0–0.7)
Eosinophil %: 7 %
HCT: 35.7 % (ref 35.0–47.0)
HGB: 11.4 g/dL — ABNORMAL LOW (ref 12.0–16.0)
LYMPHS PCT: 26.9 %
Lymphocyte #: 0.6 10*3/uL — ABNORMAL LOW (ref 1.0–3.6)
MCH: 27.3 pg (ref 26.0–34.0)
MCHC: 31.9 g/dL — AB (ref 32.0–36.0)
MCV: 86 fL (ref 80–100)
Monocyte #: 0.5 x10 3/mm (ref 0.2–0.9)
Monocyte %: 25.8 %
NEUTROS PCT: 39 %
Neutrophil #: 0.8 10*3/uL — ABNORMAL LOW (ref 1.4–6.5)
Platelet: 224 10*3/uL (ref 150–440)
RBC: 4.17 10*6/uL (ref 3.80–5.20)
RDW: 14.8 % — AB (ref 11.5–14.5)
WBC: 2.1 10*3/uL — ABNORMAL LOW (ref 3.6–11.0)

## 2014-06-11 LAB — BASIC METABOLIC PANEL
Anion Gap: 5 — ABNORMAL LOW (ref 7–16)
BUN: 16 mg/dL (ref 7–18)
Calcium, Total: 8.7 mg/dL (ref 8.5–10.1)
Chloride: 103 mmol/L (ref 98–107)
Co2: 26 mmol/L (ref 21–32)
Creatinine: 1.4 mg/dL — ABNORMAL HIGH (ref 0.60–1.30)
EGFR (African American): 44 — ABNORMAL LOW
GFR CALC NON AF AMER: 38 — AB
Glucose: 106 mg/dL — ABNORMAL HIGH (ref 65–99)
Osmolality: 270 (ref 275–301)
POTASSIUM: 4.4 mmol/L (ref 3.5–5.1)
Sodium: 134 mmol/L — ABNORMAL LOW (ref 136–145)

## 2014-06-11 LAB — PHOSPHORUS: Phosphorus: 3.5 mg/dL (ref 2.5–4.9)

## 2014-06-11 LAB — MAGNESIUM: MAGNESIUM: 1.7 mg/dL — AB

## 2014-06-19 ENCOUNTER — Other Ambulatory Visit: Payer: Self-pay | Admitting: Nephrology

## 2014-08-13 ENCOUNTER — Encounter: Payer: Self-pay | Admitting: Nephrology

## 2014-08-13 LAB — BASIC METABOLIC PANEL
Anion Gap: 7 (ref 7–16)
BUN: 19 mg/dL — ABNORMAL HIGH (ref 7–18)
CO2: 27 mmol/L (ref 21–32)
CREATININE: 1.19 mg/dL (ref 0.60–1.30)
Calcium, Total: 8.6 mg/dL (ref 8.5–10.1)
Chloride: 108 mmol/L — ABNORMAL HIGH (ref 98–107)
EGFR (African American): 58 — ABNORMAL LOW
EGFR (Non-African Amer.): 48 — ABNORMAL LOW
Glucose: 104 mg/dL — ABNORMAL HIGH (ref 65–99)
Osmolality: 286 (ref 275–301)
POTASSIUM: 4.3 mmol/L (ref 3.5–5.1)
Sodium: 142 mmol/L (ref 136–145)

## 2014-08-13 LAB — CBC WITH DIFFERENTIAL/PLATELET
BASOS PCT: 1.5 %
Basophil #: 0 10*3/uL (ref 0.0–0.1)
Eosinophil #: 0.2 10*3/uL (ref 0.0–0.7)
Eosinophil %: 7 %
HCT: 36.7 % (ref 35.0–47.0)
HGB: 11.5 g/dL — ABNORMAL LOW (ref 12.0–16.0)
LYMPHS ABS: 0.6 10*3/uL — AB (ref 1.0–3.6)
LYMPHS PCT: 26.7 %
MCH: 27.6 pg (ref 26.0–34.0)
MCHC: 31.5 g/dL — AB (ref 32.0–36.0)
MCV: 88 fL (ref 80–100)
MONO ABS: 0.6 x10 3/mm (ref 0.2–0.9)
Monocyte %: 25.2 %
Neutrophil #: 0.9 10*3/uL — ABNORMAL LOW (ref 1.4–6.5)
Neutrophil %: 39.6 %
PLATELETS: 226 10*3/uL (ref 150–440)
RBC: 4.18 10*6/uL (ref 3.80–5.20)
RDW: 15 % — ABNORMAL HIGH (ref 11.5–14.5)
WBC: 2.2 10*3/uL — ABNORMAL LOW (ref 3.6–11.0)

## 2014-08-13 LAB — MAGNESIUM: Magnesium: 1.7 mg/dL — ABNORMAL LOW

## 2014-08-13 LAB — PHOSPHORUS: Phosphorus: 3.9 mg/dL (ref 2.5–4.9)

## 2014-08-19 ENCOUNTER — Encounter: Payer: Self-pay | Admitting: Nephrology

## 2014-09-03 LAB — CBC WITH DIFFERENTIAL/PLATELET
Basophil #: 0 10*3/uL (ref 0.0–0.1)
Basophil %: 1.5 %
EOS ABS: 0.1 10*3/uL (ref 0.0–0.7)
Eosinophil %: 5.3 %
HCT: 35.9 % (ref 35.0–47.0)
HGB: 11.5 g/dL — ABNORMAL LOW (ref 12.0–16.0)
LYMPHS ABS: 0.6 10*3/uL — AB (ref 1.0–3.6)
Lymphocyte %: 27.4 %
MCH: 27.8 pg (ref 26.0–34.0)
MCHC: 32 g/dL (ref 32.0–36.0)
MCV: 87 fL (ref 80–100)
Monocyte #: 0.5 x10 3/mm (ref 0.2–0.9)
Monocyte %: 25.4 %
NEUTROS PCT: 40.4 %
Neutrophil #: 0.9 10*3/uL — ABNORMAL LOW (ref 1.4–6.5)
Platelet: 226 10*3/uL (ref 150–440)
RBC: 4.12 10*6/uL (ref 3.80–5.20)
RDW: 14.6 % — ABNORMAL HIGH (ref 11.5–14.5)
WBC: 2.1 10*3/uL — ABNORMAL LOW (ref 3.6–11.0)

## 2014-09-03 LAB — BASIC METABOLIC PANEL
Anion Gap: 7 (ref 7–16)
BUN: 19 mg/dL — AB (ref 7–18)
CHLORIDE: 108 mmol/L — AB (ref 98–107)
Calcium, Total: 8.7 mg/dL (ref 8.5–10.1)
Co2: 28 mmol/L (ref 21–32)
Creatinine: 1.2 mg/dL (ref 0.60–1.30)
EGFR (African American): 57 — ABNORMAL LOW
EGFR (Non-African Amer.): 47 — ABNORMAL LOW
GLUCOSE: 98 mg/dL (ref 65–99)
Osmolality: 287 (ref 275–301)
Potassium: 4.4 mmol/L (ref 3.5–5.1)
Sodium: 143 mmol/L (ref 136–145)

## 2014-09-03 LAB — PHOSPHORUS: Phosphorus: 4.4 mg/dL (ref 2.5–4.9)

## 2014-09-03 LAB — MAGNESIUM: Magnesium: 1.6 mg/dL — ABNORMAL LOW

## 2014-09-18 ENCOUNTER — Encounter: Payer: Self-pay | Admitting: Nephrology

## 2014-10-01 LAB — CBC WITH DIFFERENTIAL/PLATELET
Eosinophil: 3 %
HCT: 37.4 % (ref 35.0–47.0)
HGB: 12.5 g/dL (ref 12.0–16.0)
LYMPHS PCT: 38 %
MCH: 27.8 pg (ref 26.0–34.0)
MCHC: 33.3 g/dL (ref 32.0–36.0)
MCV: 84 fL (ref 80–100)
Monocytes: 24 %
Platelet: 279 10*3/uL (ref 150–440)
RBC: 4.48 10*6/uL (ref 3.80–5.20)
RDW: 13.7 % (ref 11.5–14.5)
SEGMENTED NEUTROPHILS: 35 %
WBC: 2.1 10*3/uL — ABNORMAL LOW (ref 3.6–11.0)

## 2014-10-01 LAB — COMPREHENSIVE METABOLIC PANEL
Albumin: 3.6 g/dL (ref 3.4–5.0)
Alkaline Phosphatase: 241 U/L — ABNORMAL HIGH
Anion Gap: 6 — ABNORMAL LOW (ref 7–16)
BUN: 13 mg/dL (ref 7–18)
Bilirubin,Total: 0.6 mg/dL (ref 0.2–1.0)
CREATININE: 1.35 mg/dL — AB (ref 0.60–1.30)
Calcium, Total: 9.3 mg/dL (ref 8.5–10.1)
Chloride: 99 mmol/L (ref 98–107)
Co2: 29 mmol/L (ref 21–32)
EGFR (African American): 50 — ABNORMAL LOW
GFR CALC NON AF AMER: 41 — AB
GLUCOSE: 100 mg/dL — AB (ref 65–99)
OSMOLALITY: 268 (ref 275–301)
Potassium: 3.7 mmol/L (ref 3.5–5.1)
SGOT(AST): 11 U/L — ABNORMAL LOW (ref 15–37)
SGPT (ALT): 15 U/L
SODIUM: 134 mmol/L — AB (ref 136–145)
Total Protein: 6.7 g/dL (ref 6.4–8.2)

## 2014-10-01 LAB — LIPID PANEL
Cholesterol: 104 mg/dL (ref 0–200)
HDL Cholesterol: 30 mg/dL — ABNORMAL LOW (ref 40–60)
LDL CHOLESTEROL, CALC: 46 mg/dL (ref 0–100)
Triglycerides: 142 mg/dL (ref 0–200)
VLDL Cholesterol, Calc: 28 mg/dL (ref 5–40)

## 2014-10-01 LAB — PHOSPHORUS: PHOSPHORUS: 3.2 mg/dL (ref 2.5–4.9)

## 2014-10-01 LAB — MAGNESIUM: Magnesium: 1.5 mg/dL — ABNORMAL LOW

## 2014-10-01 LAB — GAMMA GT: GGT: 12 U/L (ref 5–85)

## 2014-10-19 ENCOUNTER — Encounter: Payer: Self-pay | Admitting: Nephrology

## 2014-12-10 ENCOUNTER — Other Ambulatory Visit: Payer: Self-pay | Admitting: Nephrology

## 2014-12-18 ENCOUNTER — Other Ambulatory Visit
Admit: 2014-12-18 | Disposition: A | Discharge status: Home / Self Care | Payer: Self-pay | Attending: Nephrology | Admitting: Nephrology

## 2014-12-24 ENCOUNTER — Emergency Department: Payer: Self-pay | Admitting: Emergency Medicine

## 2015-01-18 ENCOUNTER — Other Ambulatory Visit
Admit: 2015-01-18 | Disposition: A | Discharge status: Home / Self Care | Payer: Self-pay | Attending: Nephrology | Admitting: Nephrology

## 2015-01-21 LAB — COMPREHENSIVE METABOLIC PANEL
AST: 22 U/L
Albumin: 4.1 g/dL
Alkaline Phosphatase: 74 U/L
Anion Gap: 6 — ABNORMAL LOW (ref 7–16)
BUN: 15 mg/dL
Bilirubin,Total: 0.5 mg/dL
CALCIUM: 9 mg/dL
CHLORIDE: 108 mmol/L
CREATININE: 1.14 mg/dL — AB
Co2: 24 mmol/L
EGFR (African American): 57 — ABNORMAL LOW
GFR CALC NON AF AMER: 49 — AB
Glucose: 115 mg/dL — ABNORMAL HIGH
Potassium: 4.1 mmol/L
SGPT (ALT): 20 U/L
Sodium: 138 mmol/L
Total Protein: 6.5 g/dL

## 2015-01-21 LAB — CBC WITH DIFFERENTIAL/PLATELET
BASOS PCT: 0.9 %
Basophil #: 0 10*3/uL (ref 0.0–0.1)
EOS ABS: 0.2 10*3/uL (ref 0.0–0.7)
Eosinophil %: 4.3 %
HCT: 36.5 % (ref 35.0–47.0)
HGB: 11.4 g/dL — AB (ref 12.0–16.0)
LYMPHS ABS: 1 10*3/uL (ref 1.0–3.6)
LYMPHS PCT: 20.5 %
MCH: 26.7 pg (ref 26.0–34.0)
MCHC: 31.3 g/dL — AB (ref 32.0–36.0)
MCV: 85 fL (ref 80–100)
Monocyte #: 0.6 x10 3/mm (ref 0.2–0.9)
Monocyte %: 12.5 %
NEUTROS ABS: 3 10*3/uL (ref 1.4–6.5)
Neutrophil %: 61.8 %
Platelet: 224 10*3/uL (ref 150–440)
RBC: 4.28 10*6/uL (ref 3.80–5.20)
RDW: 16.2 % — AB (ref 11.5–14.5)
WBC: 4.9 10*3/uL (ref 3.6–11.0)

## 2015-01-21 LAB — LIPID PANEL
Cholesterol: 136 mg/dL
HDL Cholesterol: 44 mg/dL
LDL CHOLESTEROL, CALC: 79 mg/dL
TRIGLYCERIDES: 66 mg/dL
VLDL Cholesterol, Calc: 13 mg/dL

## 2015-01-21 LAB — PHOSPHORUS: Phosphorus: 3.8 mg/dL

## 2015-01-21 LAB — MAGNESIUM: Magnesium: 1.6 mg/dL — ABNORMAL LOW

## 2015-01-21 LAB — GAMMA GT: GGT: 13 U/L

## 2015-02-25 ENCOUNTER — Encounter
Admission: RE | Admit: 2015-02-25 | Discharge: 2015-02-25 | Disposition: A | Discharge status: Home / Self Care | Payer: Medicare Other | Source: Ambulatory Visit | Attending: Nephrology | Admitting: Nephrology

## 2015-02-25 DIAGNOSIS — Z114 Encounter for screening for human immunodeficiency virus [HIV]: Secondary | ICD-10-CM | POA: Diagnosis not present

## 2015-02-25 DIAGNOSIS — N390 Urinary tract infection, site not specified: Secondary | ICD-10-CM | POA: Diagnosis not present

## 2015-02-25 DIAGNOSIS — B259 Cytomegaloviral disease, unspecified: Secondary | ICD-10-CM | POA: Insufficient documentation

## 2015-02-25 DIAGNOSIS — Z9483 Pancreas transplant status: Secondary | ICD-10-CM | POA: Insufficient documentation

## 2015-02-25 DIAGNOSIS — D631 Anemia in chronic kidney disease: Secondary | ICD-10-CM | POA: Diagnosis not present

## 2015-02-25 DIAGNOSIS — T8610 Unspecified complication of kidney transplant: Secondary | ICD-10-CM | POA: Insufficient documentation

## 2015-02-25 DIAGNOSIS — Z789 Other specified health status: Secondary | ICD-10-CM | POA: Insufficient documentation

## 2015-02-25 DIAGNOSIS — Z09 Encounter for follow-up examination after completed treatment for conditions other than malignant neoplasm: Secondary | ICD-10-CM | POA: Insufficient documentation

## 2015-02-25 DIAGNOSIS — Z940 Kidney transplant status: Secondary | ICD-10-CM | POA: Insufficient documentation

## 2015-02-25 DIAGNOSIS — E1129 Type 2 diabetes mellitus with other diabetic kidney complication: Secondary | ICD-10-CM | POA: Insufficient documentation

## 2015-02-25 DIAGNOSIS — E559 Vitamin D deficiency, unspecified: Secondary | ICD-10-CM | POA: Diagnosis not present

## 2015-02-25 DIAGNOSIS — Z79899 Other long term (current) drug therapy: Secondary | ICD-10-CM | POA: Insufficient documentation

## 2015-02-25 LAB — BASIC METABOLIC PANEL
ANION GAP: 7 (ref 5–15)
BUN: 22 mg/dL — AB (ref 6–20)
CHLORIDE: 107 mmol/L (ref 101–111)
CO2: 24 mmol/L (ref 22–32)
Calcium: 8.9 mg/dL (ref 8.9–10.3)
Creatinine, Ser: 1.41 mg/dL — ABNORMAL HIGH (ref 0.44–1.00)
GFR calc Af Amer: 43 mL/min — ABNORMAL LOW (ref >60)
GFR calc non Af Amer: 37 mL/min — ABNORMAL LOW (ref >60)
Glucose, Bld: 102 mg/dL — ABNORMAL HIGH (ref 65–99)
Potassium: 4.5 mmol/L (ref 3.5–5.1)
Sodium: 138 mmol/L (ref 135–145)

## 2015-02-25 LAB — CBC
HEMATOCRIT: 36.7 % (ref 35.0–47.0)
HEMOGLOBIN: 11.7 g/dL — AB (ref 12.0–16.0)
MCH: 27.2 pg (ref 26.0–34.0)
MCHC: 32 g/dL (ref 32.0–36.0)
MCV: 85.1 fL (ref 80.0–100.0)
Platelets: 220 10*3/uL (ref 150–440)
RBC: 4.31 MIL/uL (ref 3.80–5.20)
RDW: 14.8 % — ABNORMAL HIGH (ref 11.5–14.5)
WBC: 5.8 10*3/uL (ref 3.6–11.0)

## 2015-02-25 LAB — MAGNESIUM: Magnesium: 1.9 mg/dL (ref 1.7–2.4)

## 2015-02-25 LAB — PHOSPHORUS: PHOSPHORUS: 3.6 mg/dL (ref 2.5–4.6)

## 2015-02-27 ENCOUNTER — Encounter: Payer: Self-pay | Admitting: *Deleted

## 2015-02-27 NOTE — Anesthesia Preprocedure Evaluation (Addendum)
Anesthesia Evaluation  Patient identified by MRN, date of birth, ID band  Reviewed: Allergy & Precautions, H&P , NPO status , Patient's Chart, lab work & pertinent test results  Airway Mallampati: II  TM Distance: >3 FB Neck ROM: full    Dental no notable dental hx. (+) Upper Dentures, Lower Dentures   Pulmonary asthma ,    Pulmonary exam normal       Cardiovascular hypertension, Rhythm:regular Rate:Normal     Neuro/Psych    GI/Hepatic negative GI ROS, Neg liver ROS,   Endo/Other    Renal/GU Renal transplant 2014- doing well since.  Cr 1.1- on tacro- stable with no problems.     Musculoskeletal   Abdominal   Peds  Hematology   Anesthesia Other Findings   Reproductive/Obstetrics                          Anesthesia Physical Anesthesia Plan  ASA: II  Anesthesia Plan: MAC and Bier Block   Post-op Pain Management:    Induction:   Airway Management Planned:   Additional Equipment:   Intra-op Plan:   Post-operative Plan:   Informed Consent: I have reviewed the patients History and Physical, chart, labs and discussed the procedure including the risks, benefits and alternatives for the proposed anesthesia with the patient or authorized representative who has indicated his/her understanding and acceptance.     Plan Discussed with: CRNA  Anesthesia Plan Comments:         Anesthesia Quick Evaluation

## 2015-03-06 ENCOUNTER — Ambulatory Visit
Admission: RE | Admit: 2015-03-06 | Discharge: 2015-03-06 | Disposition: A | Discharge status: Home / Self Care | Payer: Medicare Other | Source: Ambulatory Visit | Attending: Surgery | Admitting: Surgery

## 2015-03-06 ENCOUNTER — Ambulatory Visit: Payer: Medicare Other | Admitting: Anesthesiology

## 2015-03-06 ENCOUNTER — Encounter
Admission: RE | Disposition: A | Discharge status: Home / Self Care | Payer: Self-pay | Source: Ambulatory Visit | Attending: Surgery

## 2015-03-06 DIAGNOSIS — M65331 Trigger finger, right middle finger: Secondary | ICD-10-CM | POA: Insufficient documentation

## 2015-03-06 DIAGNOSIS — M65341 Trigger finger, right ring finger: Secondary | ICD-10-CM | POA: Diagnosis present

## 2015-03-06 HISTORY — DX: Presence of dental prosthetic device (complete) (partial): Z97.2

## 2015-03-06 HISTORY — DX: Unspecified asthma, uncomplicated: J45.909

## 2015-03-06 HISTORY — DX: Essential (primary) hypertension: I10

## 2015-03-06 HISTORY — PX: TRIGGER FINGER RELEASE: SHX641

## 2015-03-06 HISTORY — DX: Complete loss of teeth, unspecified cause, unspecified class: K08.109

## 2015-03-06 SURGERY — RELEASE, A1 PULLEY, FOR TRIGGER FINGER
Anesthesia: Monitor Anesthesia Care | Laterality: Right | Wound class: Clean

## 2015-03-06 MED ORDER — LIDOCAINE HCL (PF) 0.5 % IJ SOLN
INTRAMUSCULAR | Status: DC | PRN
  Administered 2015-03-06: 45 mL via INTRAVENOUS

## 2015-03-06 MED ORDER — CEFAZOLIN SODIUM-DEXTROSE 2-3 GM-% IV SOLR
2.0000 g | Freq: Once | INTRAVENOUS | Status: AC
  Administered 2015-03-06: 2 g via INTRAVENOUS

## 2015-03-06 MED ORDER — LACTATED RINGERS IV SOLN
INTRAVENOUS | Status: DC
  Administered 2015-03-06: 14:00:00 via INTRAVENOUS

## 2015-03-06 MED ORDER — FENTANYL CITRATE (PF) 100 MCG/2ML IJ SOLN
INTRAMUSCULAR | Status: DC | PRN
Start: 2015-03-06 — End: 2015-03-06
  Administered 2015-03-06: 100 ug via INTRAVENOUS

## 2015-03-06 MED ORDER — MIDAZOLAM HCL 5 MG/5ML IJ SOLN
INTRAMUSCULAR | Status: DC | PRN
  Administered 2015-03-06: 2 mg via INTRAVENOUS

## 2015-03-06 MED ORDER — PROPOFOL INFUSION 10 MG/ML OPTIME
INTRAVENOUS | Status: DC | PRN
  Administered 2015-03-06: 50 ug/kg/min via INTRAVENOUS

## 2015-03-06 MED ORDER — BUPIVACAINE HCL (PF) 0.5 % IJ SOLN
INTRAMUSCULAR | Status: DC | PRN
  Administered 2015-03-06: 15 mL

## 2015-03-06 MED ORDER — HYDROCODONE-ACETAMINOPHEN 5-325 MG PO TABS: 1.0000 | ORAL_TABLET | ORAL | Status: DC | PRN

## 2015-03-06 MED ORDER — LIDOCAINE HCL (PF) 0.5 % IJ SOLN: INTRAMUSCULAR | Status: DC | PRN

## 2015-03-06 SURGICAL SUPPLY — 26 items
BANDAGE ELASTIC 2 VELCRO NS LF (GAUZE/BANDAGES/DRESSINGS) IMPLANT
BANDAGE ELASTIC 3 VELCRO NS (GAUZE/BANDAGES/DRESSINGS) ×3 IMPLANT
BANDAGE ELASTIC 4 VELCRO NS (GAUZE/BANDAGES/DRESSINGS) IMPLANT
BNDG COHESIVE 4X5 TAN STRL (GAUZE/BANDAGES/DRESSINGS) ×3 IMPLANT
BNDG ESMARK 4X12 TAN STRL LF (GAUZE/BANDAGES/DRESSINGS) ×3 IMPLANT
CHLORAPREP W/TINT 26ML (MISCELLANEOUS) ×3 IMPLANT
CORD BIP STRL DISP 12FT (MISCELLANEOUS) IMPLANT
COVER LIGHT HANDLE FLEXIBLE (MISCELLANEOUS) ×3 IMPLANT
CUFF TOURN SGL QUICK 18 (TOURNIQUET CUFF) IMPLANT
GAUZE PETRO XEROFOAM 1X8 (MISCELLANEOUS) ×3 IMPLANT
GAUZE SPONGE 4X4 12PLY STRL (GAUZE/BANDAGES/DRESSINGS) ×3 IMPLANT
GAUZE STRETCH 2X75IN STRL (MISCELLANEOUS) IMPLANT
GLOVE BIO SURGEON STRL SZ8 (GLOVE) ×6 IMPLANT
GLOVE INDICATOR 8.0 STRL GRN (GLOVE) ×3 IMPLANT
GOWN STRL REUS W/ TWL LRG LVL3 (GOWN DISPOSABLE) ×1 IMPLANT
GOWN STRL REUS W/ TWL XL LVL3 (GOWN DISPOSABLE) ×1 IMPLANT
GOWN STRL REUS W/TWL LRG LVL3 (GOWN DISPOSABLE) ×2
GOWN STRL REUS W/TWL XL LVL3 (GOWN DISPOSABLE) ×2
NS IRRIG 500ML POUR BTL (IV SOLUTION) ×3 IMPLANT
PACK EXTREMITY ARMC (MISCELLANEOUS) ×3 IMPLANT
PAD GROUND ADULT SPLIT (MISCELLANEOUS) ×3 IMPLANT
STOCKINETTE IMPERVIOUS LG (DRAPES) ×3 IMPLANT
STRAP BODY AND KNEE 60X3 (MISCELLANEOUS) ×3 IMPLANT
SUT PROLENE 4 0 PS 2 18 (SUTURE) ×3 IMPLANT
SUT VIC AB 3-0 SH 27 (SUTURE)
SUT VIC AB 3-0 SH 27X BRD (SUTURE) IMPLANT

## 2015-03-06 NOTE — Op Note (Signed)
03/06/2015  2:50 PM  Patient:   Ruth Johnson  Pre-Op Diagnosis:   M65.331 TRIGGER MIDDLE FINGER OF RIGHT HAND M65.341 TRIGGER RING FINGER OF RIGHT HAND  Post-Op Diagnosis:   Same  Procedure:   Release right long and ring trigger fingers.  Surgeon:   Maryagnes AmosJ. Jeffrey Ayanni Tun, MD  Assistant:   Alvy BealJenna Sota Hetz, PA-S  Anesthesia:   Bier block  Findings:   As above.  Complications:   None  EBL:   2 cc  Fluids:   450 cc crystalloid  TT:   46 minutes at 250 mmHg  Drains:   None  Closure:   4-0 Prolene interrupted sutures  Implants:   None  Brief Clinical Note:   The patient is a 69 year old female with a 4-6 month history of painful catching of her right long and ring fingers. Her symptoms have persisted despite medications, activity modification, and buddy taping/splinting. Her history and examination are consistent with right long and ring trigger fingers. She presents at this time for definitive management of her ring and long trigger fingers.  Procedure:   The patient was brought into the operating room and lain in the supine position. After adequate IV sedation was achieved, a Bier block was placed by the anesthesiologist and the tourniquet inflated to 250 mmHg. The right hand and upper extremity were prepped with ChloraPrep solution before being draped sterilely. Preoperative antibiotics were administered. After performing a timeout to verify the appropriate side, an approximately 1.5 cm incision was made along the distal palmar crease, centered over the right ring flexor sheath. The incision was carried down through the subcutaneous tissues to expose the flexor tendon sheath. The tendon sheath was incised just proximal to the A1 pulley. A hemostat was passed beneath the A1 pulley, then adjusted so one of the jaws was superficial to and the other deep to the A1 pulley. The #15 blade was used to cut on either side of the hemostat to remove a 2 mm strip of tissue from the A1 pulley. A  small Metzenbaum scissors was used to ensure complete release of the A1 pulley. The underlying tendons were carefully inspected and found to be intact.  Next, the long finger was addressed. An approximately 1.5 cm incision was made along the distal palmar crease, centered over the right long flexor sheath. The incision was carried down through the subcutaneous tissues to expose the flexor tendon sheath. The tendon sheath was incised just proximal to the A1 pulley. A hemostat was passed beneath the A1 pulley, then adjusted so one of the jaws was superficial to and the other deep to the A1 pulley. The #15 blade was used to cut on either side of the hemostat to remove a 2 mm strip of tissue from the A1 pulley. A small Metzenbaum scissors was used to ensure complete release of the A1 pulley. The underlying tendons were carefully inspected and found to be intact.  Both wounds were irrigated thoroughly with sterile saline solution before being closed using 4-0 Prolene interrupted sutures. A total of 15 cc of 0.5% Sensorcaine plain was injected in and around both incisions to help with postoperative analgesia before a sterile bulky dressing was applied to the hand. The patient was then awakened and returned to the recovery room in satisfactory condition after tolerating the procedure well.

## 2015-03-06 NOTE — H&P (Signed)
Paper H&P to be scanned into permanent record. H&P reviewed. No changes. 

## 2015-03-06 NOTE — Transfer of Care (Signed)
Immediate Anesthesia Transfer of Care Note  Patient: Ruth Johnson  Procedure(s) Performed: Procedure(s): Release of right long and ring trigger fingers (Right)  Patient Location: PACU  Anesthesia Type: MAC, Bier Block  Level of Consciousness: awake, alert  and patient cooperative  Airway and Oxygen Therapy: Patient Spontanous Breathing and Patient connected to supplemental oxygen  Post-op Assessment: Post-op Vital signs reviewed, Patient's Cardiovascular Status Stable, Respiratory Function Stable, Patent Airway and No signs of Nausea or vomiting  Post-op Vital Signs: Reviewed and stable  Complications: No apparent anesthesia complications

## 2015-03-06 NOTE — Discharge Instructions (Addendum)
General Anesthesia, Care After °Refer to this sheet in the next few weeks. These instructions provide you with information on caring for yourself after your procedure. Your health care provider may also give you more specific instructions. Your treatment has been planned according to current medical practices, but problems sometimes occur. Call your health care provider if you have any problems or questions after your procedure. °WHAT TO EXPECT AFTER THE PROCEDURE °After the procedure, it is typical to experience: °· Sleepiness. °· Nausea and vomiting. °HOME CARE INSTRUCTIONS °· For the first 24 hours after general anesthesia: °¨ Have a responsible person with you. °¨ Do not drive a car. If you are alone, do not take public transportation. °¨ Do not drink alcohol. °¨ Do not take medicine that has not been prescribed by your health care provider. °¨ Do not sign important papers or make important decisions. °¨ You may resume a normal diet and activities as directed by your health care provider. °· Change bandages (dressings) as directed. °· If you have questions or problems that seem related to general anesthesia, call the hospital and ask for the anesthetist or anesthesiologist on call. °SEEK MEDICAL CARE IF: °· You have nausea and vomiting that continue the day after anesthesia. °· You develop a rash. °SEEK IMMEDIATE MEDICAL CARE IF:  °· You have difficulty breathing. °· You have chest pain. °· You have any allergic problems. °Document Released: 01/11/2001 Document Revised: 10/10/2013 Document Reviewed: 04/20/2013 °ExitCare® Patient Information ©2015 [employer]. This information is not intended to replace advice given to you by your health care provider. Make sure you discuss any questions you have with your health care provider. ° °Keep dressing dry and intact.  °May remove dressing on Sunday morning.  °May shower once dressing changed. Cover stitches with Band-aids after drying off. °Keep hand elevated above  heart level. °Apply ice to affected area frequently. °Return for follow-up in 10-14 days or as scheduled. ° °

## 2015-03-06 NOTE — Anesthesia Postprocedure Evaluation (Signed)
  Anesthesia Post-op Note  Patient: Ruth Johnson  Procedure(s) Performed: Procedure(s): Release of right long and ring trigger fingers (Right)  Anesthesia type:MAC, Bier Block  Patient location: PACU  Post pain: Pain level controlled  Post assessment: Post-op Vital signs reviewed, Patient's Cardiovascular Status Stable, Respiratory Function Stable, Patent Airway and No signs of Nausea or vomiting  Post vital signs: Reviewed and stable  Last Vitals:  Filed Vitals:   03/06/15 1507  BP: 131/78  Pulse: 52  Temp: 36.3 C  Resp: 16    Level of consciousness: awake, alert  and patient cooperative  Complications: No apparent anesthesia complications

## 2015-03-06 NOTE — Anesthesia Procedure Notes (Signed)
Anesthesia Regional Block:  Bier block (IV Regional)  Pre-Anesthetic Checklist: ,, timeout performed, Correct Patient, Correct Site, Correct Laterality, Correct Procedure, Correct Position, site marked, Risks and benefits discussed, Surgical consent, Pre-op evaluation  Laterality: Right  Prep: alcohol swabs       Needles:  Injection technique: Single-shot      Additional Needles: Bier block (IV Regional) Narrative:  Start time: 03/06/2015 2:01 PM End time: 03/06/2015 2:05 PM Injection made incrementally with aspirations every 45 mL.  Performed by: Personally  Anesthesiologist: Ranee GosselinSTELLA, Harutyun Monteverde

## 2015-03-07 ENCOUNTER — Encounter: Payer: Self-pay | Admitting: Surgery

## 2015-05-06 ENCOUNTER — Encounter
Admission: RE | Admit: 2015-05-06 | Discharge: 2015-05-06 | Disposition: A | Discharge status: Home / Self Care | Payer: Medicare Other | Source: Ambulatory Visit | Attending: Nephrology | Admitting: Nephrology

## 2015-05-06 DIAGNOSIS — Z79899 Other long term (current) drug therapy: Secondary | ICD-10-CM | POA: Insufficient documentation

## 2015-05-06 DIAGNOSIS — Z940 Kidney transplant status: Secondary | ICD-10-CM | POA: Insufficient documentation

## 2015-05-06 LAB — CBC WITH DIFFERENTIAL/PLATELET
BASOS ABS: 0 10*3/uL (ref 0–0.1)
Basophils Relative: 1 %
EOS ABS: 0.2 10*3/uL (ref 0–0.7)
Eosinophils Relative: 4 %
HCT: 36.6 % (ref 35.0–47.0)
HEMOGLOBIN: 11.9 g/dL — AB (ref 12.0–16.0)
LYMPHS ABS: 1 10*3/uL (ref 1.0–3.6)
Lymphocytes Relative: 19 %
MCH: 27 pg (ref 26.0–34.0)
MCHC: 32.5 g/dL (ref 32.0–36.0)
MCV: 83.1 fL (ref 80.0–100.0)
Monocytes Absolute: 0.7 10*3/uL (ref 0.2–0.9)
Monocytes Relative: 14 %
NEUTROS PCT: 62 %
Neutro Abs: 3 10*3/uL (ref 1.4–6.5)
Platelets: 235 10*3/uL (ref 150–440)
RBC: 4.41 MIL/uL (ref 3.80–5.20)
RDW: 14.8 % — AB (ref 11.5–14.5)
WBC: 4.9 10*3/uL (ref 3.6–11.0)

## 2015-05-06 LAB — BASIC METABOLIC PANEL
Anion gap: 7 (ref 5–15)
BUN: 21 mg/dL — ABNORMAL HIGH (ref 6–20)
CHLORIDE: 105 mmol/L (ref 101–111)
CO2: 25 mmol/L (ref 22–32)
Calcium: 9.1 mg/dL (ref 8.9–10.3)
Creatinine, Ser: 1.32 mg/dL — ABNORMAL HIGH (ref 0.44–1.00)
GFR calc Af Amer: 46 mL/min — ABNORMAL LOW (ref >60)
GFR, EST NON AFRICAN AMERICAN: 40 mL/min — AB (ref >60)
Glucose, Bld: 92 mg/dL (ref 65–99)
Potassium: 4 mmol/L (ref 3.5–5.1)
Sodium: 137 mmol/L (ref 135–145)

## 2015-05-06 LAB — MAGNESIUM: MAGNESIUM: 1.7 mg/dL (ref 1.7–2.4)

## 2015-05-06 LAB — PHOSPHORUS: PHOSPHORUS: 3.8 mg/dL (ref 2.5–4.6)

## 2015-06-03 ENCOUNTER — Encounter
Admission: RE | Admit: 2015-06-03 | Discharge: 2015-06-03 | Disposition: A | Discharge status: Home / Self Care | Payer: Medicare Other | Source: Ambulatory Visit | Attending: Nephrology | Admitting: Nephrology

## 2015-06-03 DIAGNOSIS — E1129 Type 2 diabetes mellitus with other diabetic kidney complication: Secondary | ICD-10-CM | POA: Insufficient documentation

## 2015-06-03 DIAGNOSIS — E559 Vitamin D deficiency, unspecified: Secondary | ICD-10-CM | POA: Diagnosis not present

## 2015-06-03 DIAGNOSIS — Z114 Encounter for screening for human immunodeficiency virus [HIV]: Secondary | ICD-10-CM | POA: Diagnosis not present

## 2015-06-03 DIAGNOSIS — Z09 Encounter for follow-up examination after completed treatment for conditions other than malignant neoplasm: Secondary | ICD-10-CM | POA: Diagnosis present

## 2015-06-03 DIAGNOSIS — D631 Anemia in chronic kidney disease: Secondary | ICD-10-CM | POA: Insufficient documentation

## 2015-06-03 DIAGNOSIS — Z940 Kidney transplant status: Secondary | ICD-10-CM | POA: Insufficient documentation

## 2015-06-03 DIAGNOSIS — Z789 Other specified health status: Secondary | ICD-10-CM | POA: Insufficient documentation

## 2015-06-03 DIAGNOSIS — Z79899 Other long term (current) drug therapy: Secondary | ICD-10-CM | POA: Diagnosis present

## 2015-06-03 DIAGNOSIS — N390 Urinary tract infection, site not specified: Secondary | ICD-10-CM | POA: Insufficient documentation

## 2015-06-03 LAB — COMPREHENSIVE METABOLIC PANEL
ALBUMIN: 4.6 g/dL (ref 3.5–5.0)
ALT: 15 U/L (ref 14–54)
AST: 19 U/L (ref 15–41)
Alkaline Phosphatase: 61 U/L (ref 38–126)
Anion gap: 8 (ref 5–15)
BUN: 24 mg/dL — AB (ref 6–20)
CHLORIDE: 106 mmol/L (ref 101–111)
CO2: 24 mmol/L (ref 22–32)
CREATININE: 1.44 mg/dL — AB (ref 0.44–1.00)
Calcium: 9.2 mg/dL (ref 8.9–10.3)
GFR calc Af Amer: 42 mL/min — ABNORMAL LOW (ref >60)
GFR calc non Af Amer: 36 mL/min — ABNORMAL LOW (ref >60)
Glucose, Bld: 99 mg/dL (ref 65–99)
Potassium: 4 mmol/L (ref 3.5–5.1)
SODIUM: 138 mmol/L (ref 135–145)
Total Bilirubin: 0.8 mg/dL (ref 0.3–1.2)
Total Protein: 7 g/dL (ref 6.5–8.1)

## 2015-06-03 LAB — CBC WITH DIFFERENTIAL/PLATELET
BASOS ABS: 0 10*3/uL (ref 0–0.1)
Basophils Relative: 1 %
Eosinophils Absolute: 0.2 10*3/uL (ref 0–0.7)
Eosinophils Relative: 3 %
HCT: 37.3 % (ref 35.0–47.0)
Hemoglobin: 12 g/dL (ref 12.0–16.0)
LYMPHS PCT: 17 %
Lymphs Abs: 1 10*3/uL (ref 1.0–3.6)
MCH: 27.1 pg (ref 26.0–34.0)
MCHC: 32.2 g/dL (ref 32.0–36.0)
MCV: 84.1 fL (ref 80.0–100.0)
Monocytes Absolute: 0.7 10*3/uL (ref 0.2–0.9)
Monocytes Relative: 11 %
Neutro Abs: 4.2 10*3/uL (ref 1.4–6.5)
Neutrophils Relative %: 68 %
Platelets: 235 10*3/uL (ref 150–440)
RBC: 4.44 MIL/uL (ref 3.80–5.20)
RDW: 14.9 % — AB (ref 11.5–14.5)
WBC: 6.1 10*3/uL (ref 3.6–11.0)

## 2015-06-03 LAB — LIPID PANEL
Cholesterol: 123 mg/dL (ref 0–200)
HDL: 40 mg/dL — AB (ref >40)
LDL CALC: 68 mg/dL (ref 0–99)
Total CHOL/HDL Ratio: 3.1 RATIO
Triglycerides: 75 mg/dL (ref <150)
VLDL: 15 mg/dL (ref 0–40)

## 2015-06-03 LAB — PHOSPHORUS: PHOSPHORUS: 3.9 mg/dL (ref 2.5–4.6)

## 2015-06-03 LAB — MAGNESIUM: Magnesium: 1.7 mg/dL (ref 1.7–2.4)

## 2015-06-03 LAB — GAMMA GT: GGT: 9 U/L (ref 7–50)

## 2015-09-09 ENCOUNTER — Encounter
Admission: RE | Admit: 2015-09-09 | Discharge: 2015-09-09 | Disposition: A | Discharge status: Home / Self Care | Payer: Medicare Other | Source: Ambulatory Visit | Attending: Nephrology | Admitting: Nephrology

## 2015-09-09 DIAGNOSIS — E1129 Type 2 diabetes mellitus with other diabetic kidney complication: Secondary | ICD-10-CM | POA: Diagnosis not present

## 2015-09-09 DIAGNOSIS — Z79899 Other long term (current) drug therapy: Secondary | ICD-10-CM | POA: Diagnosis present

## 2015-09-09 DIAGNOSIS — Z114 Encounter for screening for human immunodeficiency virus [HIV]: Secondary | ICD-10-CM | POA: Diagnosis not present

## 2015-09-09 DIAGNOSIS — N390 Urinary tract infection, site not specified: Secondary | ICD-10-CM | POA: Insufficient documentation

## 2015-09-09 DIAGNOSIS — D631 Anemia in chronic kidney disease: Secondary | ICD-10-CM | POA: Diagnosis not present

## 2015-09-09 DIAGNOSIS — E559 Vitamin D deficiency, unspecified: Secondary | ICD-10-CM | POA: Insufficient documentation

## 2015-09-09 DIAGNOSIS — Z09 Encounter for follow-up examination after completed treatment for conditions other than malignant neoplasm: Secondary | ICD-10-CM | POA: Insufficient documentation

## 2015-09-09 DIAGNOSIS — Z789 Other specified health status: Secondary | ICD-10-CM | POA: Diagnosis not present

## 2015-09-09 DIAGNOSIS — Z940 Kidney transplant status: Secondary | ICD-10-CM | POA: Insufficient documentation

## 2015-09-09 LAB — CBC WITH DIFFERENTIAL/PLATELET
BASOS PCT: 1 %
Basophils Absolute: 0 10*3/uL (ref 0–0.1)
EOS ABS: 0.2 10*3/uL (ref 0–0.7)
EOS PCT: 4 %
HCT: 37.8 % (ref 35.0–47.0)
Hemoglobin: 12.1 g/dL (ref 12.0–16.0)
LYMPHS ABS: 0.8 10*3/uL — AB (ref 1.0–3.6)
Lymphocytes Relative: 18 %
MCH: 27.3 pg (ref 26.0–34.0)
MCHC: 32 g/dL (ref 32.0–36.0)
MCV: 85.5 fL (ref 80.0–100.0)
MONOS PCT: 13 %
Monocytes Absolute: 0.6 10*3/uL (ref 0.2–0.9)
NEUTROS PCT: 64 %
Neutro Abs: 2.9 10*3/uL (ref 1.4–6.5)
PLATELETS: 237 10*3/uL (ref 150–440)
RBC: 4.43 MIL/uL (ref 3.80–5.20)
RDW: 14.5 % (ref 11.5–14.5)
WBC: 4.5 10*3/uL (ref 3.6–11.0)

## 2015-09-09 LAB — COMPREHENSIVE METABOLIC PANEL
ALBUMIN: 4.4 g/dL (ref 3.5–5.0)
ALK PHOS: 61 U/L (ref 38–126)
ALT: 16 U/L (ref 14–54)
ANION GAP: 6 (ref 5–15)
AST: 18 U/L (ref 15–41)
BILIRUBIN TOTAL: 0.7 mg/dL (ref 0.3–1.2)
BUN: 12 mg/dL (ref 6–20)
CALCIUM: 9.1 mg/dL (ref 8.9–10.3)
CO2: 27 mmol/L (ref 22–32)
Chloride: 107 mmol/L (ref 101–111)
Creatinine, Ser: 1.12 mg/dL — ABNORMAL HIGH (ref 0.44–1.00)
GFR calc Af Amer: 56 mL/min — ABNORMAL LOW (ref >60)
GFR calc non Af Amer: 49 mL/min — ABNORMAL LOW (ref >60)
GLUCOSE: 100 mg/dL — AB (ref 65–99)
POTASSIUM: 4.2 mmol/L (ref 3.5–5.1)
SODIUM: 140 mmol/L (ref 135–145)
TOTAL PROTEIN: 6.9 g/dL (ref 6.5–8.1)

## 2015-09-09 LAB — MAGNESIUM: Magnesium: 1.7 mg/dL (ref 1.7–2.4)

## 2015-09-09 LAB — LIPID PANEL
CHOL/HDL RATIO: 3.3 ratio
CHOLESTEROL: 143 mg/dL (ref 0–200)
HDL: 44 mg/dL (ref >40)
LDL Cholesterol: 82 mg/dL (ref 0–99)
Triglycerides: 83 mg/dL (ref <150)
VLDL: 17 mg/dL (ref 0–40)

## 2015-09-09 LAB — PHOSPHORUS: Phosphorus: 3.9 mg/dL (ref 2.5–4.6)

## 2015-09-09 LAB — GAMMA GT: GGT: 11 U/L (ref 7–50)

## 2015-09-30 ENCOUNTER — Encounter
Admission: RE | Admit: 2015-09-30 | Discharge: 2015-09-30 | Disposition: A | Discharge status: Home / Self Care | Payer: Medicare Other | Source: Ambulatory Visit | Attending: Nephrology | Admitting: Nephrology

## 2015-09-30 DIAGNOSIS — Z09 Encounter for follow-up examination after completed treatment for conditions other than malignant neoplasm: Secondary | ICD-10-CM | POA: Diagnosis not present

## 2015-09-30 DIAGNOSIS — E559 Vitamin D deficiency, unspecified: Secondary | ICD-10-CM | POA: Insufficient documentation

## 2015-09-30 DIAGNOSIS — Z940 Kidney transplant status: Secondary | ICD-10-CM | POA: Insufficient documentation

## 2015-09-30 DIAGNOSIS — E1129 Type 2 diabetes mellitus with other diabetic kidney complication: Secondary | ICD-10-CM | POA: Insufficient documentation

## 2015-09-30 DIAGNOSIS — T8610 Unspecified complication of kidney transplant: Secondary | ICD-10-CM | POA: Diagnosis not present

## 2015-09-30 DIAGNOSIS — Z79899 Other long term (current) drug therapy: Secondary | ICD-10-CM | POA: Insufficient documentation

## 2015-09-30 DIAGNOSIS — D631 Anemia in chronic kidney disease: Secondary | ICD-10-CM | POA: Insufficient documentation

## 2015-09-30 DIAGNOSIS — N390 Urinary tract infection, site not specified: Secondary | ICD-10-CM | POA: Diagnosis not present

## 2015-09-30 DIAGNOSIS — Z114 Encounter for screening for human immunodeficiency virus [HIV]: Secondary | ICD-10-CM | POA: Insufficient documentation

## 2015-09-30 DIAGNOSIS — Z789 Other specified health status: Secondary | ICD-10-CM | POA: Insufficient documentation

## 2015-09-30 LAB — CBC WITH DIFFERENTIAL/PLATELET
BASOS PCT: 1 %
Basophils Absolute: 0 10*3/uL (ref 0–0.1)
EOS PCT: 4 %
Eosinophils Absolute: 0.2 10*3/uL (ref 0–0.7)
HCT: 36.8 % (ref 35.0–47.0)
Hemoglobin: 11.7 g/dL — ABNORMAL LOW (ref 12.0–16.0)
Lymphocytes Relative: 15 %
Lymphs Abs: 0.8 10*3/uL — ABNORMAL LOW (ref 1.0–3.6)
MCH: 27.4 pg (ref 26.0–34.0)
MCHC: 31.9 g/dL — AB (ref 32.0–36.0)
MCV: 85.9 fL (ref 80.0–100.0)
MONO ABS: 0.7 10*3/uL (ref 0.2–0.9)
Monocytes Relative: 14 %
NEUTROS ABS: 3.5 10*3/uL (ref 1.4–6.5)
Neutrophils Relative %: 66 %
PLATELETS: 219 10*3/uL (ref 150–440)
RBC: 4.28 MIL/uL (ref 3.80–5.20)
RDW: 14.8 % — AB (ref 11.5–14.5)
WBC: 5.2 10*3/uL (ref 3.6–11.0)

## 2015-09-30 LAB — BASIC METABOLIC PANEL
ANION GAP: 6 (ref 5–15)
BUN: 17 mg/dL (ref 6–20)
CALCIUM: 8.8 mg/dL — AB (ref 8.9–10.3)
CHLORIDE: 111 mmol/L (ref 101–111)
CO2: 25 mmol/L (ref 22–32)
CREATININE: 1.05 mg/dL — AB (ref 0.44–1.00)
GFR calc non Af Amer: 53 mL/min — ABNORMAL LOW (ref >60)
Glucose, Bld: 110 mg/dL — ABNORMAL HIGH (ref 65–99)
Potassium: 4.9 mmol/L (ref 3.5–5.1)
SODIUM: 142 mmol/L (ref 135–145)

## 2015-09-30 LAB — MAGNESIUM: MAGNESIUM: 2 mg/dL (ref 1.7–2.4)

## 2015-09-30 LAB — PHOSPHORUS: PHOSPHORUS: 2.7 mg/dL (ref 2.5–4.6)

## 2015-11-05 ENCOUNTER — Other Ambulatory Visit
Admission: RE | Admit: 2015-11-05 | Discharge: 2015-11-05 | Disposition: A | Discharge status: Home / Self Care | Payer: Medicare Other | Source: Ambulatory Visit | Attending: Nephrology | Admitting: Nephrology

## 2015-11-05 DIAGNOSIS — Z940 Kidney transplant status: Secondary | ICD-10-CM | POA: Diagnosis present

## 2015-11-05 DIAGNOSIS — D631 Anemia in chronic kidney disease: Secondary | ICD-10-CM | POA: Insufficient documentation

## 2015-11-05 DIAGNOSIS — E1129 Type 2 diabetes mellitus with other diabetic kidney complication: Secondary | ICD-10-CM | POA: Diagnosis not present

## 2015-11-05 DIAGNOSIS — N390 Urinary tract infection, site not specified: Secondary | ICD-10-CM | POA: Diagnosis not present

## 2015-11-05 DIAGNOSIS — Z79899 Other long term (current) drug therapy: Secondary | ICD-10-CM | POA: Insufficient documentation

## 2015-11-05 DIAGNOSIS — E559 Vitamin D deficiency, unspecified: Secondary | ICD-10-CM | POA: Insufficient documentation

## 2015-11-05 DIAGNOSIS — Z09 Encounter for follow-up examination after completed treatment for conditions other than malignant neoplasm: Secondary | ICD-10-CM | POA: Insufficient documentation

## 2015-11-05 DIAGNOSIS — Z114 Encounter for screening for human immunodeficiency virus [HIV]: Secondary | ICD-10-CM | POA: Insufficient documentation

## 2015-11-05 LAB — CBC WITH DIFFERENTIAL/PLATELET
Basophils Absolute: 0.1 10*3/uL (ref 0–0.1)
Basophils Relative: 2 %
EOS ABS: 0.3 10*3/uL (ref 0–0.7)
Eosinophils Relative: 5 %
HEMATOCRIT: 38.8 % (ref 35.0–47.0)
HEMOGLOBIN: 12.4 g/dL (ref 12.0–16.0)
LYMPHS ABS: 1.1 10*3/uL (ref 1.0–3.6)
Lymphocytes Relative: 18 %
MCH: 26.6 pg (ref 26.0–34.0)
MCHC: 32 g/dL (ref 32.0–36.0)
MCV: 83.3 fL (ref 80.0–100.0)
MONOS PCT: 10 %
Monocytes Absolute: 0.6 10*3/uL (ref 0.2–0.9)
NEUTROS ABS: 3.9 10*3/uL (ref 1.4–6.5)
NEUTROS PCT: 65 %
Platelets: 219 10*3/uL (ref 150–440)
RBC: 4.66 MIL/uL (ref 3.80–5.20)
RDW: 14.5 % (ref 11.5–14.5)
WBC: 6 10*3/uL (ref 3.6–11.0)

## 2015-11-05 LAB — BASIC METABOLIC PANEL
Anion gap: 8 (ref 5–15)
BUN: 16 mg/dL (ref 6–20)
CHLORIDE: 107 mmol/L (ref 101–111)
CO2: 27 mmol/L (ref 22–32)
Calcium: 9 mg/dL (ref 8.9–10.3)
Creatinine, Ser: 1.12 mg/dL — ABNORMAL HIGH (ref 0.44–1.00)
GFR calc non Af Amer: 49 mL/min — ABNORMAL LOW (ref >60)
GFR, EST AFRICAN AMERICAN: 56 mL/min — AB (ref >60)
Glucose, Bld: 102 mg/dL — ABNORMAL HIGH (ref 65–99)
POTASSIUM: 3.9 mmol/L (ref 3.5–5.1)
SODIUM: 142 mmol/L (ref 135–145)

## 2015-11-05 LAB — PHOSPHORUS: PHOSPHORUS: 2.6 mg/dL (ref 2.5–4.6)

## 2015-11-05 LAB — MAGNESIUM: MAGNESIUM: 1.7 mg/dL (ref 1.7–2.4)

## 2015-12-30 ENCOUNTER — Other Ambulatory Visit
Admission: RE | Admit: 2015-12-30 | Discharge: 2015-12-30 | Disposition: A | Discharge status: Home / Self Care | Payer: Medicare Other | Source: Ambulatory Visit | Attending: Nephrology | Admitting: Nephrology

## 2015-12-30 DIAGNOSIS — Z79899 Other long term (current) drug therapy: Secondary | ICD-10-CM | POA: Diagnosis not present

## 2015-12-30 DIAGNOSIS — T8610 Unspecified complication of kidney transplant: Secondary | ICD-10-CM | POA: Diagnosis not present

## 2015-12-30 DIAGNOSIS — E559 Vitamin D deficiency, unspecified: Secondary | ICD-10-CM | POA: Insufficient documentation

## 2015-12-30 DIAGNOSIS — E1129 Type 2 diabetes mellitus with other diabetic kidney complication: Secondary | ICD-10-CM | POA: Diagnosis not present

## 2015-12-30 DIAGNOSIS — Z940 Kidney transplant status: Secondary | ICD-10-CM | POA: Diagnosis present

## 2015-12-30 DIAGNOSIS — Z114 Encounter for screening for human immunodeficiency virus [HIV]: Secondary | ICD-10-CM | POA: Diagnosis not present

## 2015-12-30 DIAGNOSIS — D631 Anemia in chronic kidney disease: Secondary | ICD-10-CM | POA: Insufficient documentation

## 2015-12-30 DIAGNOSIS — Z789 Other specified health status: Secondary | ICD-10-CM | POA: Diagnosis not present

## 2015-12-30 DIAGNOSIS — N390 Urinary tract infection, site not specified: Secondary | ICD-10-CM | POA: Diagnosis not present

## 2015-12-30 DIAGNOSIS — Z09 Encounter for follow-up examination after completed treatment for conditions other than malignant neoplasm: Secondary | ICD-10-CM | POA: Insufficient documentation

## 2015-12-30 LAB — CBC WITH DIFFERENTIAL/PLATELET
BASOS ABS: 0 10*3/uL (ref 0–0.1)
BASOS PCT: 1 %
EOS ABS: 0.2 10*3/uL (ref 0–0.7)
EOS PCT: 3 %
HCT: 38.3 % (ref 35.0–47.0)
Hemoglobin: 12.7 g/dL (ref 12.0–16.0)
Lymphocytes Relative: 14 %
Lymphs Abs: 0.8 10*3/uL — ABNORMAL LOW (ref 1.0–3.6)
MCH: 27.8 pg (ref 26.0–34.0)
MCHC: 33.2 g/dL (ref 32.0–36.0)
MCV: 83.5 fL (ref 80.0–100.0)
Monocytes Absolute: 0.5 10*3/uL (ref 0.2–0.9)
Monocytes Relative: 9 %
NEUTROS PCT: 73 %
Neutro Abs: 4.3 10*3/uL (ref 1.4–6.5)
PLATELETS: 225 10*3/uL (ref 150–440)
RBC: 4.58 MIL/uL (ref 3.80–5.20)
RDW: 14.5 % (ref 11.5–14.5)
WBC: 5.8 10*3/uL (ref 3.6–11.0)

## 2015-12-30 LAB — BASIC METABOLIC PANEL
ANION GAP: 8 (ref 5–15)
BUN: 15 mg/dL (ref 6–20)
CALCIUM: 9.5 mg/dL (ref 8.9–10.3)
CO2: 26 mmol/L (ref 22–32)
Chloride: 106 mmol/L (ref 101–111)
Creatinine, Ser: 1.07 mg/dL — ABNORMAL HIGH (ref 0.44–1.00)
GFR, EST AFRICAN AMERICAN: 60 mL/min — AB (ref >60)
GFR, EST NON AFRICAN AMERICAN: 51 mL/min — AB (ref >60)
Glucose, Bld: 103 mg/dL — ABNORMAL HIGH (ref 65–99)
POTASSIUM: 3.7 mmol/L (ref 3.5–5.1)
Sodium: 140 mmol/L (ref 135–145)

## 2015-12-30 LAB — PHOSPHORUS: Phosphorus: 3.2 mg/dL (ref 2.5–4.6)

## 2015-12-30 LAB — MAGNESIUM: MAGNESIUM: 1.5 mg/dL — AB (ref 1.7–2.4)

## 2016-01-27 ENCOUNTER — Other Ambulatory Visit
Admission: RE | Admit: 2016-01-27 | Discharge: 2016-01-27 | Disposition: A | Discharge status: Home / Self Care | Payer: Medicare Other | Source: Ambulatory Visit | Attending: Nephrology | Admitting: Nephrology

## 2016-01-27 DIAGNOSIS — Z940 Kidney transplant status: Secondary | ICD-10-CM | POA: Diagnosis present

## 2016-01-27 LAB — CBC WITH DIFFERENTIAL/PLATELET
BASOS ABS: 0 10*3/uL (ref 0–0.1)
BASOS PCT: 1 %
EOS ABS: 0.1 10*3/uL (ref 0–0.7)
Eosinophils Relative: 2 %
HCT: 36.7 % (ref 35.0–47.0)
HEMOGLOBIN: 12.3 g/dL (ref 12.0–16.0)
LYMPHS ABS: 0.8 10*3/uL — AB (ref 1.0–3.6)
Lymphocytes Relative: 14 %
MCH: 27.7 pg (ref 26.0–34.0)
MCHC: 33.4 g/dL (ref 32.0–36.0)
MCV: 83 fL (ref 80.0–100.0)
Monocytes Absolute: 0.6 10*3/uL (ref 0.2–0.9)
Monocytes Relative: 11 %
NEUTROS PCT: 72 %
Neutro Abs: 4.1 10*3/uL (ref 1.4–6.5)
PLATELETS: 215 10*3/uL (ref 150–440)
RBC: 4.42 MIL/uL (ref 3.80–5.20)
RDW: 14.1 % (ref 11.5–14.5)
WBC: 5.8 10*3/uL (ref 3.6–11.0)

## 2016-01-27 LAB — PHOSPHORUS: PHOSPHORUS: 3.7 mg/dL (ref 2.5–4.6)

## 2016-01-27 LAB — BASIC METABOLIC PANEL
ANION GAP: 6 (ref 5–15)
BUN: 16 mg/dL (ref 6–20)
CO2: 25 mmol/L (ref 22–32)
Calcium: 9.1 mg/dL (ref 8.9–10.3)
Chloride: 102 mmol/L (ref 101–111)
Creatinine, Ser: 1.17 mg/dL — ABNORMAL HIGH (ref 0.44–1.00)
GFR, EST AFRICAN AMERICAN: 53 mL/min — AB (ref >60)
GFR, EST NON AFRICAN AMERICAN: 46 mL/min — AB (ref >60)
GLUCOSE: 119 mg/dL — AB (ref 65–99)
POTASSIUM: 4 mmol/L (ref 3.5–5.1)
Sodium: 133 mmol/L — ABNORMAL LOW (ref 135–145)

## 2016-01-27 LAB — MAGNESIUM: MAGNESIUM: 1.4 mg/dL — AB (ref 1.7–2.4)

## 2016-02-24 ENCOUNTER — Other Ambulatory Visit
Admission: RE | Admit: 2016-02-24 | Discharge: 2016-02-24 | Disposition: A | Discharge status: Home / Self Care | Payer: Medicare Other | Source: Ambulatory Visit | Attending: Nephrology | Admitting: Nephrology

## 2016-02-24 DIAGNOSIS — D631 Anemia in chronic kidney disease: Secondary | ICD-10-CM | POA: Diagnosis not present

## 2016-02-24 DIAGNOSIS — E1129 Type 2 diabetes mellitus with other diabetic kidney complication: Secondary | ICD-10-CM | POA: Diagnosis not present

## 2016-02-24 DIAGNOSIS — Z114 Encounter for screening for human immunodeficiency virus [HIV]: Secondary | ICD-10-CM | POA: Diagnosis not present

## 2016-02-24 DIAGNOSIS — Z79899 Other long term (current) drug therapy: Secondary | ICD-10-CM | POA: Insufficient documentation

## 2016-02-24 DIAGNOSIS — E559 Vitamin D deficiency, unspecified: Secondary | ICD-10-CM | POA: Insufficient documentation

## 2016-02-24 DIAGNOSIS — Z940 Kidney transplant status: Secondary | ICD-10-CM | POA: Diagnosis present

## 2016-02-24 DIAGNOSIS — N390 Urinary tract infection, site not specified: Secondary | ICD-10-CM | POA: Diagnosis not present

## 2016-02-24 LAB — CBC WITH DIFFERENTIAL/PLATELET
Basophils Absolute: 0 10*3/uL (ref 0–0.1)
Basophils Relative: 1 %
Eosinophils Absolute: 0.2 10*3/uL (ref 0–0.7)
Eosinophils Relative: 3 %
HEMATOCRIT: 37.2 % (ref 35.0–47.0)
HEMOGLOBIN: 12.3 g/dL (ref 12.0–16.0)
LYMPHS ABS: 0.8 10*3/uL — AB (ref 1.0–3.6)
LYMPHS PCT: 18 %
MCH: 27.6 pg (ref 26.0–34.0)
MCHC: 33 g/dL (ref 32.0–36.0)
MCV: 83.6 fL (ref 80.0–100.0)
MONOS PCT: 13 %
Monocytes Absolute: 0.6 10*3/uL (ref 0.2–0.9)
NEUTROS ABS: 3.1 10*3/uL (ref 1.4–6.5)
NEUTROS PCT: 65 %
Platelets: 242 10*3/uL (ref 150–440)
RBC: 4.45 MIL/uL (ref 3.80–5.20)
RDW: 14.8 % — ABNORMAL HIGH (ref 11.5–14.5)
WBC: 4.7 10*3/uL (ref 3.6–11.0)

## 2016-02-24 LAB — COMPREHENSIVE METABOLIC PANEL
ALBUMIN: 4.6 g/dL (ref 3.5–5.0)
ALT: 12 U/L — ABNORMAL LOW (ref 14–54)
ANION GAP: 10 (ref 5–15)
AST: 16 U/L (ref 15–41)
Alkaline Phosphatase: 60 U/L (ref 38–126)
BILIRUBIN TOTAL: 0.9 mg/dL (ref 0.3–1.2)
BUN: 15 mg/dL (ref 6–20)
CHLORIDE: 104 mmol/L (ref 101–111)
CO2: 26 mmol/L (ref 22–32)
Calcium: 9.9 mg/dL (ref 8.9–10.3)
Creatinine, Ser: 1.04 mg/dL — ABNORMAL HIGH (ref 0.44–1.00)
GFR calc Af Amer: >60 mL/min (ref >60)
GFR, EST NON AFRICAN AMERICAN: 53 mL/min — AB (ref >60)
GLUCOSE: 101 mg/dL — AB (ref 65–99)
POTASSIUM: 4.5 mmol/L (ref 3.5–5.1)
Sodium: 140 mmol/L (ref 135–145)
TOTAL PROTEIN: 6.9 g/dL (ref 6.5–8.1)

## 2016-02-24 LAB — MAGNESIUM: Magnesium: 1.5 mg/dL — ABNORMAL LOW (ref 1.7–2.4)

## 2016-02-24 LAB — LIPID PANEL
CHOL/HDL RATIO: 2.7 ratio
Cholesterol: 118 mg/dL (ref 0–200)
HDL: 44 mg/dL (ref >40)
LDL CALC: 63 mg/dL (ref 0–99)
Triglycerides: 54 mg/dL (ref <150)
VLDL: 11 mg/dL (ref 0–40)

## 2016-02-24 LAB — GAMMA GT: GGT: 13 U/L (ref 7–50)

## 2016-02-24 LAB — PHOSPHORUS: Phosphorus: 4.2 mg/dL (ref 2.5–4.6)

## 2016-03-30 ENCOUNTER — Encounter
Admission: RE | Admit: 2016-03-30 | Discharge: 2016-03-30 | Disposition: A | Discharge status: Home / Self Care | Payer: Medicare Other | Source: Ambulatory Visit | Attending: Nephrology | Admitting: Nephrology

## 2016-03-30 DIAGNOSIS — Z114 Encounter for screening for human immunodeficiency virus [HIV]: Secondary | ICD-10-CM | POA: Diagnosis not present

## 2016-03-30 DIAGNOSIS — Z789 Other specified health status: Secondary | ICD-10-CM | POA: Diagnosis not present

## 2016-03-30 DIAGNOSIS — E1129 Type 2 diabetes mellitus with other diabetic kidney complication: Secondary | ICD-10-CM | POA: Insufficient documentation

## 2016-03-30 DIAGNOSIS — Z79899 Other long term (current) drug therapy: Secondary | ICD-10-CM | POA: Diagnosis present

## 2016-03-30 DIAGNOSIS — D631 Anemia in chronic kidney disease: Secondary | ICD-10-CM | POA: Diagnosis not present

## 2016-03-30 DIAGNOSIS — Z940 Kidney transplant status: Secondary | ICD-10-CM | POA: Diagnosis present

## 2016-03-30 DIAGNOSIS — N390 Urinary tract infection, site not specified: Secondary | ICD-10-CM | POA: Diagnosis not present

## 2016-03-30 DIAGNOSIS — Z09 Encounter for follow-up examination after completed treatment for conditions other than malignant neoplasm: Secondary | ICD-10-CM | POA: Insufficient documentation

## 2016-03-30 DIAGNOSIS — E559 Vitamin D deficiency, unspecified: Secondary | ICD-10-CM | POA: Diagnosis not present

## 2016-03-30 LAB — BASIC METABOLIC PANEL
Anion gap: 9 (ref 5–15)
BUN: 16 mg/dL (ref 6–20)
CALCIUM: 9.2 mg/dL (ref 8.9–10.3)
CHLORIDE: 106 mmol/L (ref 101–111)
CO2: 23 mmol/L (ref 22–32)
CREATININE: 1.16 mg/dL — AB (ref 0.44–1.00)
GFR calc non Af Amer: 46 mL/min — ABNORMAL LOW (ref >60)
GFR, EST AFRICAN AMERICAN: 54 mL/min — AB (ref >60)
Glucose, Bld: 113 mg/dL — ABNORMAL HIGH (ref 65–99)
Potassium: 4.4 mmol/L (ref 3.5–5.1)
SODIUM: 138 mmol/L (ref 135–145)

## 2016-03-30 LAB — CBC WITH DIFFERENTIAL/PLATELET
Basophils Absolute: 0 10*3/uL (ref 0–0.1)
Basophils Relative: 1 %
EOS PCT: 4 %
Eosinophils Absolute: 0.2 10*3/uL (ref 0–0.7)
HEMATOCRIT: 35.9 % (ref 35.0–47.0)
Hemoglobin: 11.9 g/dL — ABNORMAL LOW (ref 12.0–16.0)
LYMPHS PCT: 15 %
Lymphs Abs: 0.9 10*3/uL — ABNORMAL LOW (ref 1.0–3.6)
MCH: 27.6 pg (ref 26.0–34.0)
MCHC: 33.1 g/dL (ref 32.0–36.0)
MCV: 83.4 fL (ref 80.0–100.0)
MONO ABS: 0.6 10*3/uL (ref 0.2–0.9)
MONOS PCT: 10 %
NEUTROS ABS: 4.6 10*3/uL (ref 1.4–6.5)
Neutrophils Relative %: 70 %
PLATELETS: 359 10*3/uL (ref 150–440)
RBC: 4.3 MIL/uL (ref 3.80–5.20)
RDW: 13.9 % (ref 11.5–14.5)
WBC: 6.4 10*3/uL (ref 3.6–11.0)

## 2016-03-30 LAB — MAGNESIUM: MAGNESIUM: 1.7 mg/dL (ref 1.7–2.4)

## 2016-03-30 LAB — PHOSPHORUS: Phosphorus: 4 mg/dL (ref 2.5–4.6)

## 2016-05-04 ENCOUNTER — Encounter
Admission: RE | Admit: 2016-05-04 | Discharge: 2016-05-04 | Disposition: A | Discharge status: Home / Self Care | Payer: Medicare Other | Source: Ambulatory Visit | Attending: Nephrology | Admitting: Nephrology

## 2016-05-04 DIAGNOSIS — Z789 Other specified health status: Secondary | ICD-10-CM | POA: Insufficient documentation

## 2016-05-04 DIAGNOSIS — Z940 Kidney transplant status: Secondary | ICD-10-CM | POA: Insufficient documentation

## 2016-05-04 DIAGNOSIS — Z114 Encounter for screening for human immunodeficiency virus [HIV]: Secondary | ICD-10-CM | POA: Insufficient documentation

## 2016-05-04 DIAGNOSIS — N390 Urinary tract infection, site not specified: Secondary | ICD-10-CM | POA: Insufficient documentation

## 2016-05-04 DIAGNOSIS — D631 Anemia in chronic kidney disease: Secondary | ICD-10-CM | POA: Diagnosis not present

## 2016-05-04 DIAGNOSIS — E559 Vitamin D deficiency, unspecified: Secondary | ICD-10-CM | POA: Diagnosis present

## 2016-05-04 DIAGNOSIS — E1129 Type 2 diabetes mellitus with other diabetic kidney complication: Secondary | ICD-10-CM | POA: Insufficient documentation

## 2016-05-04 DIAGNOSIS — Z79899 Other long term (current) drug therapy: Secondary | ICD-10-CM | POA: Insufficient documentation

## 2016-05-04 LAB — MAGNESIUM: MAGNESIUM: 1.5 mg/dL — AB (ref 1.7–2.4)

## 2016-05-04 LAB — BASIC METABOLIC PANEL
ANION GAP: 7 (ref 5–15)
BUN: 22 mg/dL — ABNORMAL HIGH (ref 6–20)
CALCIUM: 9.2 mg/dL (ref 8.9–10.3)
CO2: 25 mmol/L (ref 22–32)
Chloride: 105 mmol/L (ref 101–111)
Creatinine, Ser: 1.27 mg/dL — ABNORMAL HIGH (ref 0.44–1.00)
GFR, EST AFRICAN AMERICAN: 48 mL/min — AB (ref >60)
GFR, EST NON AFRICAN AMERICAN: 41 mL/min — AB (ref >60)
GLUCOSE: 107 mg/dL — AB (ref 65–99)
Potassium: 4 mmol/L (ref 3.5–5.1)
SODIUM: 137 mmol/L (ref 135–145)

## 2016-05-04 LAB — CBC
HCT: 36.3 % (ref 35.0–47.0)
Hemoglobin: 12.5 g/dL (ref 12.0–16.0)
MCH: 28.7 pg (ref 26.0–34.0)
MCHC: 34.5 g/dL (ref 32.0–36.0)
MCV: 83.2 fL (ref 80.0–100.0)
PLATELETS: 213 10*3/uL (ref 150–440)
RBC: 4.36 MIL/uL (ref 3.80–5.20)
RDW: 14.6 % — AB (ref 11.5–14.5)
WBC: 6.1 10*3/uL (ref 3.6–11.0)

## 2016-05-04 LAB — PHOSPHORUS: Phosphorus: 4 mg/dL (ref 2.5–4.6)

## 2016-08-31 ENCOUNTER — Other Ambulatory Visit
Admission: RE | Admit: 2016-08-31 | Discharge: 2016-08-31 | Disposition: A | Discharge status: Home / Self Care | Payer: Medicare Other | Source: Ambulatory Visit | Attending: Nephrology | Admitting: Nephrology

## 2016-08-31 DIAGNOSIS — Z09 Encounter for follow-up examination after completed treatment for conditions other than malignant neoplasm: Secondary | ICD-10-CM | POA: Insufficient documentation

## 2016-08-31 DIAGNOSIS — T8610 Unspecified complication of kidney transplant: Secondary | ICD-10-CM | POA: Diagnosis not present

## 2016-08-31 DIAGNOSIS — Z114 Encounter for screening for human immunodeficiency virus [HIV]: Secondary | ICD-10-CM | POA: Diagnosis not present

## 2016-08-31 DIAGNOSIS — E1129 Type 2 diabetes mellitus with other diabetic kidney complication: Secondary | ICD-10-CM | POA: Diagnosis not present

## 2016-08-31 DIAGNOSIS — Z789 Other specified health status: Secondary | ICD-10-CM | POA: Diagnosis not present

## 2016-08-31 DIAGNOSIS — Z79899 Other long term (current) drug therapy: Secondary | ICD-10-CM | POA: Diagnosis present

## 2016-08-31 DIAGNOSIS — Z794 Long term (current) use of insulin: Secondary | ICD-10-CM | POA: Diagnosis present

## 2016-08-31 DIAGNOSIS — E559 Vitamin D deficiency, unspecified: Secondary | ICD-10-CM | POA: Diagnosis present

## 2016-08-31 DIAGNOSIS — D631 Anemia in chronic kidney disease: Secondary | ICD-10-CM | POA: Insufficient documentation

## 2016-08-31 DIAGNOSIS — N390 Urinary tract infection, site not specified: Secondary | ICD-10-CM | POA: Insufficient documentation

## 2016-08-31 LAB — CBC WITH DIFFERENTIAL/PLATELET
Basophils Absolute: 0.1 10*3/uL (ref 0–0.1)
Basophils Relative: 1 %
Eosinophils Absolute: 0.2 10*3/uL (ref 0–0.7)
Eosinophils Relative: 3 %
HEMATOCRIT: 39.1 % (ref 35.0–47.0)
HEMOGLOBIN: 13 g/dL (ref 12.0–16.0)
LYMPHS ABS: 1 10*3/uL (ref 1.0–3.6)
LYMPHS PCT: 14 %
MCH: 27.8 pg (ref 26.0–34.0)
MCHC: 33.3 g/dL (ref 32.0–36.0)
MCV: 83.6 fL (ref 80.0–100.0)
Monocytes Absolute: 0.7 10*3/uL (ref 0.2–0.9)
Monocytes Relative: 10 %
NEUTROS ABS: 4.9 10*3/uL (ref 1.4–6.5)
NEUTROS PCT: 72 %
Platelets: 228 10*3/uL (ref 150–440)
RBC: 4.67 MIL/uL (ref 3.80–5.20)
RDW: 14.7 % — ABNORMAL HIGH (ref 11.5–14.5)
WBC: 6.9 10*3/uL (ref 3.6–11.0)

## 2016-08-31 LAB — BASIC METABOLIC PANEL
ANION GAP: 7 (ref 5–15)
BUN: 18 mg/dL (ref 6–20)
CALCIUM: 9.5 mg/dL (ref 8.9–10.3)
CHLORIDE: 106 mmol/L (ref 101–111)
CO2: 27 mmol/L (ref 22–32)
Creatinine, Ser: 1.08 mg/dL — ABNORMAL HIGH (ref 0.44–1.00)
GFR calc Af Amer: 58 mL/min — ABNORMAL LOW (ref >60)
GFR calc non Af Amer: 50 mL/min — ABNORMAL LOW (ref >60)
GLUCOSE: 106 mg/dL — AB (ref 65–99)
POTASSIUM: 4 mmol/L (ref 3.5–5.1)
Sodium: 140 mmol/L (ref 135–145)

## 2016-08-31 LAB — MAGNESIUM: Magnesium: 1.6 mg/dL — ABNORMAL LOW (ref 1.7–2.4)

## 2016-08-31 LAB — PHOSPHORUS: Phosphorus: 4.1 mg/dL (ref 2.5–4.6)

## 2016-09-01 LAB — TACROLIMUS LEVEL: TACROLIMUS (FK506) - LABCORP: 7 ng/mL (ref 2.0–20.0)

## 2016-12-28 ENCOUNTER — Other Ambulatory Visit
Admission: RE | Admit: 2016-12-28 | Discharge: 2016-12-28 | Disposition: A | Discharge status: Home / Self Care | Payer: Medicare Other | Source: Ambulatory Visit | Attending: Nephrology | Admitting: Nephrology

## 2016-12-28 DIAGNOSIS — Z114 Encounter for screening for human immunodeficiency virus [HIV]: Secondary | ICD-10-CM | POA: Diagnosis not present

## 2016-12-28 DIAGNOSIS — N189 Chronic kidney disease, unspecified: Secondary | ICD-10-CM | POA: Diagnosis not present

## 2016-12-28 DIAGNOSIS — B259 Cytomegaloviral disease, unspecified: Secondary | ICD-10-CM | POA: Diagnosis not present

## 2016-12-28 DIAGNOSIS — E1129 Type 2 diabetes mellitus with other diabetic kidney complication: Secondary | ICD-10-CM | POA: Insufficient documentation

## 2016-12-28 DIAGNOSIS — Z789 Other specified health status: Secondary | ICD-10-CM | POA: Insufficient documentation

## 2016-12-28 DIAGNOSIS — Z79899 Other long term (current) drug therapy: Secondary | ICD-10-CM | POA: Insufficient documentation

## 2016-12-28 DIAGNOSIS — E559 Vitamin D deficiency, unspecified: Secondary | ICD-10-CM | POA: Insufficient documentation

## 2016-12-28 DIAGNOSIS — Z9483 Pancreas transplant status: Secondary | ICD-10-CM | POA: Insufficient documentation

## 2016-12-28 DIAGNOSIS — Z940 Kidney transplant status: Secondary | ICD-10-CM | POA: Insufficient documentation

## 2016-12-28 DIAGNOSIS — D899 Disorder involving the immune mechanism, unspecified: Secondary | ICD-10-CM | POA: Insufficient documentation

## 2016-12-28 DIAGNOSIS — D631 Anemia in chronic kidney disease: Secondary | ICD-10-CM | POA: Diagnosis not present

## 2016-12-28 DIAGNOSIS — N390 Urinary tract infection, site not specified: Secondary | ICD-10-CM | POA: Diagnosis not present

## 2016-12-28 LAB — BASIC METABOLIC PANEL
ANION GAP: 6 (ref 5–15)
BUN: 16 mg/dL (ref 6–20)
CHLORIDE: 108 mmol/L (ref 101–111)
CO2: 26 mmol/L (ref 22–32)
Calcium: 9.4 mg/dL (ref 8.9–10.3)
Creatinine, Ser: 1.05 mg/dL — ABNORMAL HIGH (ref 0.44–1.00)
GFR calc non Af Amer: 52 mL/min — ABNORMAL LOW (ref >60)
Glucose, Bld: 121 mg/dL — ABNORMAL HIGH (ref 65–99)
POTASSIUM: 4.1 mmol/L (ref 3.5–5.1)
Sodium: 140 mmol/L (ref 135–145)

## 2016-12-28 LAB — CBC WITH DIFFERENTIAL/PLATELET
BASOS ABS: 0 10*3/uL (ref 0–0.1)
Basophils Relative: 1 %
EOS PCT: 3 %
Eosinophils Absolute: 0.2 10*3/uL (ref 0–0.7)
HEMATOCRIT: 36.3 % (ref 35.0–47.0)
Hemoglobin: 12 g/dL (ref 12.0–16.0)
LYMPHS PCT: 18 %
Lymphs Abs: 1 10*3/uL (ref 1.0–3.6)
MCH: 27.7 pg (ref 26.0–34.0)
MCHC: 33.2 g/dL (ref 32.0–36.0)
MCV: 83.5 fL (ref 80.0–100.0)
Monocytes Absolute: 0.6 10*3/uL (ref 0.2–0.9)
Monocytes Relative: 11 %
Neutro Abs: 3.9 10*3/uL (ref 1.4–6.5)
Neutrophils Relative %: 67 %
PLATELETS: 241 10*3/uL (ref 150–440)
RBC: 4.35 MIL/uL (ref 3.80–5.20)
RDW: 14.9 % — ABNORMAL HIGH (ref 11.5–14.5)
WBC: 5.7 10*3/uL (ref 3.6–11.0)

## 2016-12-28 LAB — PHOSPHORUS: Phosphorus: 3.9 mg/dL (ref 2.5–4.6)

## 2016-12-28 LAB — MAGNESIUM: MAGNESIUM: 1.6 mg/dL — AB (ref 1.7–2.4)

## 2017-04-26 ENCOUNTER — Ambulatory Visit
Admission: RE | Admit: 2017-04-26 | Discharge: 2017-04-26 | Disposition: A | Discharge status: Home / Self Care | Payer: MEDICARE | Admitting: Internal Medicine

## 2017-04-26 ENCOUNTER — Ambulatory Visit
Admission: RE | Admit: 2017-04-26 | Discharge: 2017-04-26 | Disposition: A | Discharge status: Home / Self Care | Payer: MEDICARE

## 2017-04-26 DIAGNOSIS — Z1239 Encounter for other screening for malignant neoplasm of breast: Principal | ICD-10-CM

## 2017-04-26 DIAGNOSIS — Z940 Kidney transplant status: Principal | ICD-10-CM

## 2017-04-26 DIAGNOSIS — G4700 Insomnia, unspecified: Secondary | ICD-10-CM

## 2017-04-26 DIAGNOSIS — I10 Essential (primary) hypertension: Principal | ICD-10-CM

## 2017-04-26 DIAGNOSIS — Z955 Presence of coronary angioplasty implant and graft: Secondary | ICD-10-CM

## 2017-04-26 DIAGNOSIS — M810 Age-related osteoporosis without current pathological fracture: Secondary | ICD-10-CM

## 2017-04-26 MED ORDER — TRAZODONE 50 MG TABLET
ORAL_TABLET | Freq: Every evening | ORAL | 0 refills | 0.00000 days | Status: CP
Start: 2017-04-26 — End: 2018-04-18

## 2017-04-26 MED ORDER — TRAZODONE 50 MG TABLET: 50 mg | tablet | Freq: Every evening | 0 refills | 0 days | Status: AC

## 2017-04-29 MED FILL — MYFORTIC/180MG/TAB: MYFORTIC/180MG/TAB | 30 days supply | Qty: 120 | Fill #7

## 2017-04-29 MED FILL — ALENDRONATE SODIUM/70MG/TABS: ALENDRONATE SODIUM/70MG/TABS | 28 days supply | Qty: 4 | Fill #6

## 2017-04-29 MED FILL — PROGRAF/0.5MG/CAP: PROGRAF/0.5MG/CAP | 30 days supply | Qty: 120 | Fill #8

## 2017-05-24 NOTE — Unmapped (Signed)
[facility] Refill Coordination Note  Specialty Medication(s): PROGRAF 0.5MG , MYFORTIC 180MG   Additional Medications shipped: METOPROLOL, LOSARTAN, AMLODIPINE, ATORVASTATIN    Sophia Davis, DOB: 1946/03/29  Phone: [phone] (home) , Alternate phone contact: N/A  Phone or address changes today?: No  All above HIPAA information was verified with patient.  Shipping Address: [address]  [place] [address]   Insurance changes? No    Completed refill call assessment today to schedule patient's medication shipment from the [facility] [phone]).      Confirmed the medication and dosage are correct and have not changed: Yes, regimen is correct and unchanged.    Confirmed patient started or stopped the following medications in the past month:  No, there are no changes reported at this time.    Are you tolerating your medication?:  Sophia Davis reports tolerating the medication.    ADHERENCE    Prograf 0.5 mg   Quantity filled last month: 120   # of tablets left on hand: 40      Myfortic 180 mg   Quantity filled last month: 120   # of tablets left on hand: 40    Did you miss any doses in the past 4 weeks? No missed doses reported.    FINANCIAL/SHIPPING    Delivery Scheduled: Yes, Expected medication delivery date: 06/02/17     Sophia Davis did not have any additional questions at this time.    Delivery address validated in FSI scheduling system: Yes, address listed in FSI is correct.    We will follow up with patient monthly for standard refill processing and delivery.      Thank you,  Sophia Davis   [facility] Specialty Pharmacist

## 2017-06-01 MED FILL — LOSARTAN/50MG/TABS: LOSARTAN/50MG/TABS | 90 days supply | Qty: 180 | Fill #1

## 2017-06-01 MED FILL — METOPROLOL TART/25MG/TA: METOPROLOL TART/25MG/TA | 90 days supply | Qty: 90 | Fill #1

## 2017-06-01 MED FILL — MYFORTIC/180MG/TAB: MYFORTIC/180MG/TAB | 30 days supply | Qty: 120 | Fill #8

## 2017-06-01 MED FILL — PROGRAF/0.5MG/CAP: PROGRAF/0.5MG/CAP | 30 days supply | Qty: 120 | Fill #9

## 2017-06-01 MED FILL — ATORVASTATIN/40MG/TABS: ATORVASTATIN/40MG/TABS | 90 days supply | Qty: 90 | Fill #1

## 2017-06-01 MED FILL — AMLODIPINE/5MG/TABS: AMLODIPINE/5MG/TABS | 90 days supply | Qty: 90 | Fill #2

## 2017-06-10 MED FILL — ALENDRONATE SODIUM/70MG/TABS: ALENDRONATE SODIUM/70MG/TABS | 28 days supply | Qty: 1 | Fill #7

## 2017-06-23 NOTE — Unmapped (Signed)
[facility] Refill Coordination Note  Specialty Medication(s): Prograf, Myfortic  Additional Medications shipped: vitamin D    Sophia Davis, DOB: [birth year]  Phone: [phone] (home) , Alternate phone contact: N/A  Phone or address changes today?: No  All above HIPAA information was verified with patient.  Shipping Address: [address]  [place] [address]   Insurance changes? No    Completed refill call assessment today to schedule patient's medication shipment from the [facility] [phone]).      Confirmed the medication and dosage are correct and have not changed: Yes, regimen is correct and unchanged.    Confirmed patient started or stopped the following medications in the past month:  No, there are no changes reported at this time.    Are you tolerating your medication?:  Sophia Davis reports tolerating the medication.    ADHERENCE    Is this medicine transplant or covered by Medicare Part B? Yes.    Prograf 0.5 mg   Quantity filled last month: 120   # of tablets left on hand: 56      Myfortic 180 mg   Quantity filled last month: 120   # of tablets left on hand: 56      Did you miss any doses in the past 4 weeks? No missed doses reported.    FINANCIAL/SHIPPING    Delivery Scheduled: Yes, Expected medication delivery date: 9/13     [name] did not have any additional questions at this time.    Delivery address validated in FSI scheduling system: Yes, address listed in FSI is correct.    We will follow up with patient monthly for standard refill processing and delivery.      Thank you,  Clydell Hakim   [facility] Specialty Pharmacist

## 2017-06-30 MED FILL — PROGRAF/0.5MG/CAP: PROGRAF/0.5MG/CAP | 30 days supply | Qty: 120 | Fill #10

## 2017-06-30 MED FILL — MYFORTIC/180MG/TAB: MYFORTIC/180MG/TAB | 30 days supply | Qty: 120 | Fill #9

## 2017-06-30 MED FILL — VITAMIN D-3/1000 U/: VITAMIN D-3/1000 U/ | 90 days supply | Qty: 90 | Fill #1

## 2017-07-13 MED FILL — ALENDRONATE SODIUM/70MG/TABS: ALENDRONATE SODIUM/70MG/TABS | 28 days supply | Qty: 1 | Fill #8

## 2017-07-27 ENCOUNTER — Ambulatory Visit
Admission: RE | Admit: 2017-07-27 | Discharge: 2017-07-27 | Disposition: A | Discharge status: Home / Self Care | Payer: MEDICARE | Attending: Nephrology | Admitting: Nephrology

## 2017-07-27 ENCOUNTER — Ambulatory Visit
Admission: RE | Admit: 2017-07-27 | Discharge: 2017-07-27 | Disposition: A | Discharge status: Home / Self Care | Payer: MEDICARE

## 2017-07-27 DIAGNOSIS — Z940 Kidney transplant status: Principal | ICD-10-CM

## 2017-07-27 LAB — PROTEIN UA: Protein:MCnc:Pt:Urine:Qn:Test strip: NEGATIVE

## 2017-07-27 LAB — COMPREHENSIVE METABOLIC PANEL
ALBUMIN: 4.3 g/dL (ref 3.5–5.0)
ALKALINE PHOSPHATASE: 66 U/L (ref 38–126)
ALT (SGPT): 27 U/L (ref 15–48)
ANION GAP: 16 mmol/L — ABNORMAL HIGH (ref 9–15)
AST (SGOT): 23 U/L (ref 14–38)
BILIRUBIN TOTAL: 0.8 mg/dL (ref 0.0–1.2)
BLOOD UREA NITROGEN: 16 mg/dL (ref 7–21)
BUN / CREAT RATIO: 16
CHLORIDE: 101 mmol/L (ref 98–107)
CO2: 24 mmol/L (ref 22.0–30.0)
CREATININE: 0.98 mg/dL (ref 0.60–1.00)
EGFR MDRD AF AMER: >=60 mL/min/{1.73_m2} (ref >=60)
EGFR MDRD NON AF AMER: 56 mL/min/{1.73_m2} — ABNORMAL LOW (ref >=60)
GLUCOSE RANDOM: 92 mg/dL (ref 65–179)
POTASSIUM: 4 mmol/L (ref 3.5–5.0)
PROTEIN TOTAL: 6.6 g/dL (ref 6.5–8.3)

## 2017-07-27 LAB — CBC W/ AUTO DIFF
BASOPHILS ABSOLUTE COUNT: 0.1 10*9/L (ref 0.0–0.1)
EOSINOPHILS ABSOLUTE COUNT: 0.3 10*9/L (ref 0.0–0.4)
HEMATOCRIT: 36.8 % (ref 36.0–46.0)
HEMOGLOBIN: 12.2 g/dL (ref 12.0–16.0)
LYMPHOCYTES ABSOLUTE COUNT: 1.1 10*9/L — ABNORMAL LOW (ref 1.5–5.0)
MEAN CORPUSCULAR HEMOGLOBIN: 28 pg (ref 26.0–34.0)
MEAN CORPUSCULAR VOLUME: 84.4 fL (ref 80.0–100.0)
MEAN PLATELET VOLUME: 8.5 fL (ref 7.0–10.0)
MONOCYTES ABSOLUTE COUNT: 0.4 10*9/L (ref 0.2–0.8)
NEUTROPHILS ABSOLUTE COUNT: 4.2 10*9/L (ref 2.0–7.5)
PLATELET COUNT: 268 10*9/L (ref 150–440)
RED BLOOD CELL COUNT: 4.36 10*12/L (ref 4.00–5.20)
RED CELL DISTRIBUTION WIDTH: 14.2 % (ref 12.0–15.0)
WBC ADJUSTED: 6.4 10*9/L (ref 4.5–11.0)

## 2017-07-27 LAB — URINALYSIS
BILIRUBIN UA: NEGATIVE
BLOOD UA: NEGATIVE
GLUCOSE UA: NEGATIVE
KETONES UA: NEGATIVE
NITRITE UA: NEGATIVE
PH UA: 5.5 (ref 5.0–9.0)
PROTEIN UA: NEGATIVE
RBC UA: 1 /HPF (ref <4)
SPECIFIC GRAVITY UA: 1.009 (ref 1.003–1.030)
WBC UA: 4 /HPF (ref 0–5)

## 2017-07-27 LAB — TACROLIMUS, TROUGH: Lab: 6.7

## 2017-07-27 LAB — HEMATOCRIT: Lab: 36.8

## 2017-07-27 LAB — CHLORIDE: Chloride:SCnc:Pt:Ser/Plas:Qn:: 101

## 2017-07-27 LAB — PHOSPHORUS: Phosphate:MCnc:Pt:Ser/Plas:Qn:: 3.9

## 2017-07-27 LAB — CREATININE, URINE: Lab: 85.5

## 2017-07-27 LAB — MAGNESIUM: Magnesium:MCnc:Pt:Ser/Plas:Qn:: 1.3 — ABNORMAL LOW

## 2017-07-27 MED ORDER — RANITIDINE 150 MG TABLET: tablet | 11 refills | 0 days | Status: AC

## 2017-07-27 MED ORDER — RANITIDINE 150 MG TABLET
ORAL_TABLET | Freq: Two times a day (BID) | ORAL | 11 refills | 0.00000 days | Status: CP | PRN
Start: 2017-07-27 — End: 2017-07-27

## 2017-07-27 MED ORDER — CHLORTHALIDONE 25 MG TABLET: 25 mg | tablet | Freq: Every morning | 11 refills | 0 days | Status: AC

## 2017-07-27 MED ORDER — CHLORTHALIDONE 25 MG TABLET
ORAL_TABLET | Freq: Every morning | ORAL | 11 refills | 0.00000 days | Status: CP
Start: 2017-07-27 — End: 2017-07-27

## 2017-07-27 MED ORDER — CHLORTHALIDONE 25 MG TABLET: 25 mg | each | Freq: Every morning | 11 refills | 0 days

## 2017-07-27 NOTE — Unmapped (Signed)
[employer] transplant nephrology clinic visit    assessment and plan:   1. s/p kidney transplant 05/17/13. baseline creatinine 1-1.2 mg/dl. +donor specific hla class2 ab detected 09/2016. no proteinuria. no change in maintenance immunosuppression. tacrolimus 12hr lvl 4-7 ng/ml.  2. hypertension. chlorthalidone 25mg  daily initiated. stop amlodipine. blood pressure goal <130/90 mmhg.  3. preventive medicine/vaccination. influenza '17. pcv13 pneumococcal '14. ppsv23 pneumococcal '16. nl mammogram '17. nl mammogram '17. nl gyn exam '13 with no hx abnl pap sm. nl cystoscopy '14. nl colonoscopy '10. nl dermatology appt '16 with follow up appt recommended. will obtain first dose shingrix at appt with dr. Irena Cords 10/2017    [name] is a 71 y.o.woman seen in follow up s/p kidney transplant 05/17/2013. since last seen, she has had no hospitalizations or ed visits. today, she reports +leg/ankle swelling at night, self-resolves by morning. no fevers, chills, or sweats. no chest pain, palpitations, orthopnea, or shortness of breath. no abd pain. +occasional nausea/diarrhea after eating food, improved with nexium. no dysuria, hematuria, or difficulty voiding. no headache, dizziness, or lightheadedness. no paresthesias, weakness, or tremor. all other systems reviewed and negative x10 systems.    past medical hx:   1. s/p deceased donor kidney transplant 05/17/2013. mpo-anca+ chronic gn. alemtuzumab induction. baseline creatinine 1-1.2 mg/dl.  2. mpo-anca+ vasculitis/granulomatosis with polyangiitis. hx pulmonary hemorrhage. trtmt plasma exchange, iv methylprednisolone, iv/po cyclophosphamide x total 18m '03-'07; rituximab 1gm x2 '07.  3. hypertension   4. coronary artery disease s/p pci/stent '97. nl cards stress '13: nl lvef > 65% no myocardial ischemia   5. hyperlipidemia  6. osteoporosis  7. secondary/tertiary hyperparathyroidism    past surgical hx: total abdominal hysterectomy '98. 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 kidney transplant '14. right hand 3rd/4th trigger finger release '16.    all: nkda     medications: tacrolimus 1mg  bid, mycophenolate sodium 360mg  bid, losartan 50mg  bid, metoprolol 12.5mg  bid, atorvastatin 40mg  q.pm, aspirin 81mg  daily, vitamin d3 1000iu daily, alendronate 70mg  q.wk, albuterol inhaler prn; will discontinue: amlodipine 5mg  q.pm; will start: ranitidine 150mg  bid-as needed, chlorthalidone 25mg  q.am    soc hx: widowed x3 children. former smoking hx; none > 40 yrs.     physical exam: BP 110/64 (BP Site: R Arm, BP Position: Sitting, BP Cuff Size: Medium)  - Pulse 70  - Temp 36.6 ??C (97.9 ??F) (Temporal)  - Ht 154.9 cm (5' 1)  - Wt 60.6 kg (133 lb 9.6 oz)  - BMI 25.24 kg/m??  wd/wn woman nl/appropriate affect and mood. nl sclera anicteric. mmm no thrush +denture plates. neck supple no palpable ln. heart rrr nl s1s2 no m/r/g. lungs clear bilateral. abd soft nt/nd. +subcutaneous stitch on right near graft site +trace ble edema. msk no synovitis/tophi. skin no rash. neuro alert oriented non focal exam.    labs:  Lab Results   Component Value Date    WBC 6.1 04/26/2017    RBC 4.71 04/26/2017    HGB 13.0 04/26/2017    HCT 40.2 04/26/2017    MCV 85.4 04/26/2017    MCH 27.7 04/26/2017    MCHC 32.4 04/26/2017    RDW 13.7 04/26/2017    PLT 248 04/26/2017     Lab Results   Component Value Date    NA 141 04/26/2017    K 4.0 04/26/2017    CL 103 04/26/2017    CO2 28.0 04/26/2017    BUN 16 04/26/2017    CREATININE 1.04 (H) 04/26/2017  GLU 99 04/26/2017    CALCIUM 9.7 04/26/2017    ALBUMIN 4.2 04/26/2017    PHOS 3.9 04/26/2017     Lab Results   Component Value Date    A1C 5.8 06/10/2016     Lab Results   Component Value Date    ALKPHOS 56 01/26/2017    BILITOT 0.6 01/26/2017    BILIDIR <0.1 12/18/2013    PROT 6.2 (L) 01/26/2017    ALBUMIN 4.2 04/26/2017    ALT 26 01/26/2017    AST 18 01/26/2017     Lab Results   Component Value Date    CHOL 130 06/10/2016     Lab Results   Component Value Date    HDL 44 06/10/2016     No components found for: LDLCALC,  LDL,  LDLDIRECT  Lab Results   Component Value Date    TRIG 108 06/10/2016     No components found for: [facility]  Lab Results   Component Value Date    PT 11.3 07/27/2012    INR 1.1 05/16/2013    APTT 33.1 05/16/2013     Lab Results   Component Value Date    PTH 43.6 06/10/2016       Scribe's Attestation: Elwyn Lade, MD obtained and performed the history, physical exam and medical decision making elements that were entered into the chart.  Signed by Gaye Alken, Scribe, on July 27, 2017 at 10:16 AM.    ----------------------------------------------------------------------------------------------------------------------  August 01, 2017 11:11 PM. documentation assistance provided by the scribe. i was present during the time the encounter was recorded. the information recorded by the scribe was done at my direction and has been reviewed and validated by me.  ----------------------------------------------------------------------------------------------------------------------

## 2017-07-27 NOTE — Unmapped (Deleted)
Urine sample collected

## 2017-07-28 LAB — CMV DNA, QUANTITATIVE, PCR: CMV QUANT: <50 [IU]/mL — ABNORMAL HIGH (ref <0)

## 2017-07-28 LAB — CMV VIRAL LD: Lab: DETECTED — AB

## 2017-07-29 MED FILL — CHLORTHALIDONE/25MG/TABS: CHLORTHALIDONE/25MG/TABS | 30 days supply | Qty: 30 | Fill #0

## 2017-07-29 MED FILL — RANITIDINE HCL/150MG/TABS: RANITIDINE HCL/150MG/TABS | 30 days supply | Qty: 60 | Fill #0

## 2017-08-03 MED FILL — ALENDRONATE SODIUM/70MG/TABS: ALENDRONATE SODIUM/70MG/TABS | 28 days supply | Qty: 1 | Fill #9

## 2017-08-23 NOTE — Unmapped (Signed)
[facility] Pharmacy Refill and Clinical Coordination Note  Medication(s): Prograf 0.5 mg, Myfortic 180 mg  Others: alendronate, chlorthalidone, ranitidine, vit d, losartan, atorvastatin, metoprolol, amlodipine    Sophia Davis, DOB: July 24, 1946  Phone: [phone] (home) , Alternate phone contact: N/A  Shipping address: [address]  [place] [address]  Phone or address changes today?: No  All above HIPAA information verified.  Insurance changes? No    Completed refill and clinical call assessment today to schedule patient's medication shipment from the [facility] [phone]).      MEDICATION RECONCILIATION    Confirmed the medication and dosage are correct and have not changed: Yes, regimen is correct and unchanged.    Were there any changes to your medication(s) in the past month:  No, there are no changes reported at this time.    ADHERENCE    Is this medicine transplant or covered by Medicare Part B? Yes.    Prograf 0.5 mg   Quantity filled last month: 120   # of tablets left on hand: 56      Myfortic 180 mg   Quantity filled last month: 120   # of tablets left on hand: 56      Did you miss any doses in the past 4 weeks? No missed doses reported.  Adherence counseling provided? Not needed     SIDE EFFECT MANAGEMENT    Are you tolerating your medication?:  Sophia Davis reports tolerating the medication.  Side effect management discussed: None      Therapy is appropriate and should be continued.    Evidence of clinical benefit: See Epic note from 07/27/17      FINANCIAL/SHIPPING    Delivery Scheduled: Yes, Expected medication delivery date: Monday, Nov 12   Additional medications refilled: No additional medications/refills needed at this time.    Sophia Davis did not have any additional questions at this time.    Delivery address validated in FSI scheduling system: Yes, address listed above is correct.      We will follow up with patient monthly for standard refill processing and delivery.      Thank you,  Tawanna Solo [facility] Specialty Pharmacist

## 2017-08-27 MED FILL — CHLORTHALIDONE/25MG/TABS: CHLORTHALIDONE/25MG/TABS | 30 days supply | Qty: 30 | Fill #1

## 2017-08-27 MED FILL — PROGRAF/0.5MG/CAP: PROGRAF/0.5MG/CAP | 30 days supply | Qty: 120 | Fill #11

## 2017-08-27 MED FILL — VITAMIN D-3/1000 U/: VITAMIN D-3/1000 U/ | 90 days supply | Qty: 90 | Fill #2

## 2017-08-27 MED FILL — MYFORTIC/180MG/TAB: MYFORTIC/180MG/TAB | 30 days supply | Qty: 120 | Fill #10

## 2017-08-27 MED FILL — LOSARTAN/50MG/TABS: LOSARTAN/50MG/TABS | 90 days supply | Qty: 180 | Fill #2

## 2017-08-27 MED FILL — ATORVASTATIN/40MG/TABS: ATORVASTATIN/40MG/TABS | 90 days supply | Qty: 90 | Fill #2

## 2017-08-27 MED FILL — METOPROLOL TART/25MG/TA: METOPROLOL TART/25MG/TA | 90 days supply | Qty: 90 | Fill #2

## 2017-08-27 MED FILL — AMLODIPINE/5MG/TABS: AMLODIPINE/5MG/TABS | 90 days supply | Qty: 90 | Fill #3

## 2017-08-27 MED FILL — RANITIDINE HCL/150MG/TABS: RANITIDINE HCL/150MG/TABS | 30 days supply | Qty: 60 | Fill #1

## 2017-08-27 MED FILL — ALENDRONATE SODIUM/70MG/TABS: ALENDRONATE SODIUM/70MG/TABS | 28 days supply | Qty: 1 | Fill #10

## 2017-09-01 NOTE — Unmapped (Signed)
Closing old encounter for specialty drug refill call attempt. Encounter recorded at later date.

## 2017-09-21 NOTE — Unmapped (Signed)
Spoke with patient today via phone - she states she has found an extra bottle of both tacrolimus and myfortic and has over a month supply. She asks for a call back around the first of the year for transplant meds. Only needs chlorthalidone and alendronate at this point. Will readjust call.

## 2017-09-22 MED FILL — ALENDRONATE SODIUM/70MG/TABS: ALENDRONATE SODIUM/70MG/TABS | 28 days supply | Qty: 1 | Fill #11

## 2017-09-22 MED FILL — CHLORTHALIDONE/25MG/TABS: CHLORTHALIDONE/25MG/TABS | 30 days supply | Qty: 30 | Fill #2

## 2017-10-20 NOTE — Unmapped (Signed)
[facility] Refill and Clinical Coordination Note  Medication(s): MYFORTIC, PROGRAF    [name], DOB: [birth year]  Phone: [phone] (home) , Alternate phone contact: N/A  Shipping address: [address]  [place] [address]  Phone or address changes today?: No  All above HIPAA information verified.  Insurance changes? No    Completed refill and clinical call assessment today to schedule patient's medication shipment from the [facility] [phone]).      MEDICATION RECONCILIATION    Confirmed the medication and dosage are correct and have not changed: Yes, regimen is correct and unchanged.    Were there any changes to your medication(s) in the past month:  No, there are no changes reported at this time.    ADHERENCE    Is this medicine transplant or covered by Medicare Part B? Yes.    Myfortic 180 mg   Quantity filled last month: 120   # of tablets left on hand: 48  Prograf 0.5 mg   Quantity filled last month: 120   # of tablets left on hand: 47      Did you miss any doses in the past 4 weeks? No missed doses reported.  Adherence counseling provided? Not needed     SIDE EFFECT MANAGEMENT    Are you tolerating your medication?:  Gerturde reports tolerating the medication.  Side effect management discussed: None      Therapy is appropriate and should be continued.    Evidence of clinical benefit: See Epic note from 07/27/17      FINANCIAL/SHIPPING    Delivery Scheduled: Yes, Expected medication delivery date: 10/28/17   Additional medications refilled: CHLORTHALIDONE, ALENDRONATE    Jerrie did not have any additional questions at this time.    Delivery address validated in FSI scheduling system: Yes, address listed above is correct.      We will follow up with patient monthly for standard refill processing and delivery.      Thank you,  Thad Ranger   [facility] Specialty Pharmacist

## 2017-10-21 MED ORDER — ALENDRONATE 70 MG TABLET
ORAL_TABLET | 99 refills | 0.00000 days | Status: CP
Start: 2017-10-21 — End: 2017-10-21

## 2017-10-21 MED ORDER — PROGRAF 0.5 MG CAPSULE
ORAL_CAPSULE | PRN refills | 0 days | Status: CP
Start: 2017-10-21 — End: 2018-06-22

## 2017-10-21 MED ORDER — ALENDRONATE 70 MG TABLET: tablet | 0 refills | 0 days | Status: AC

## 2017-10-21 MED ORDER — ALENDRONATE 70 MG TABLET: tablet | 99 refills | 0 days

## 2017-10-21 NOTE — Unmapped (Signed)
Patient request for RX refill.

## 2017-10-26 MED FILL — ALENDRONATE SODIUM/70MG/TABS: ALENDRONATE SODIUM/70MG/TABS | 28 days supply | Qty: 1 | Fill #0

## 2017-10-26 MED FILL — CHLORTHALIDONE/25MG/TABS: CHLORTHALIDONE/25MG/TABS | 30 days supply | Qty: 30 | Fill #3

## 2017-10-27 MED FILL — MYFORTIC/180MG/TAB: MYFORTIC/180MG/TAB | 30 days supply | Qty: 120 | Fill #11

## 2017-10-27 MED FILL — PROGRAF/0.5MG/CAP: PROGRAF/0.5MG/CAP | 30 days supply | Qty: 120 | Fill #0

## 2017-11-01 ENCOUNTER — Ambulatory Visit: Admit: 2017-11-01 | Discharge: 2017-11-01 | Payer: MEDICARE

## 2017-11-01 ENCOUNTER — Other Ambulatory Visit: Admit: 2017-11-01 | Discharge: 2017-11-01 | Payer: MEDICARE

## 2017-11-01 DIAGNOSIS — C539 Malignant neoplasm of cervix uteri, unspecified: Secondary | ICD-10-CM

## 2017-11-01 DIAGNOSIS — Z940 Kidney transplant status: Principal | ICD-10-CM

## 2017-11-01 DIAGNOSIS — M3130 Wegener's granulomatosis without renal involvement: Secondary | ICD-10-CM

## 2017-11-01 DIAGNOSIS — M810 Age-related osteoporosis without current pathological fracture: Secondary | ICD-10-CM

## 2017-11-01 DIAGNOSIS — I10 Essential (primary) hypertension: Principal | ICD-10-CM

## 2017-11-01 DIAGNOSIS — N2581 Secondary hyperparathyroidism of renal origin: Secondary | ICD-10-CM

## 2017-11-01 LAB — COMPREHENSIVE METABOLIC PANEL
ALBUMIN: 4.1 g/dL (ref 3.5–5.0)
ALKALINE PHOSPHATASE: 73 U/L (ref 38–126)
ANION GAP: 11 mmol/L (ref 9–15)
AST (SGOT): 20 U/L (ref 14–38)
BILIRUBIN TOTAL: 0.7 mg/dL (ref 0.0–1.2)
BLOOD UREA NITROGEN: 12 mg/dL (ref 7–21)
BUN / CREAT RATIO: 11
CALCIUM: 9.7 mg/dL (ref 8.5–10.2)
CO2: 28 mmol/L (ref 22.0–30.0)
CREATININE: 1.05 mg/dL — ABNORMAL HIGH (ref 0.60–1.00)
EGFR MDRD AF AMER: >=60 mL/min/{1.73_m2} (ref >=60)
EGFR MDRD NON AF AMER: 52 mL/min/{1.73_m2} — ABNORMAL LOW (ref >=60)
POTASSIUM: 3.8 mmol/L (ref 3.5–5.0)
PROTEIN TOTAL: 6 g/dL — ABNORMAL LOW (ref 6.5–8.3)
SODIUM: 132 mmol/L — ABNORMAL LOW (ref 135–145)

## 2017-11-01 LAB — TACROLIMUS, TROUGH: Lab: 5.2

## 2017-11-01 LAB — CMV COMMENT: Lab: 0

## 2017-11-01 LAB — CMV DNA, QUANTITATIVE, PCR

## 2017-11-01 LAB — CBC W/ AUTO DIFF
BASOPHILS ABSOLUTE COUNT: 0 10*9/L (ref 0.0–0.1)
EOSINOPHILS ABSOLUTE COUNT: 0.2 10*9/L (ref 0.0–0.4)
HEMATOCRIT: 36 % (ref 36.0–46.0)
HEMOGLOBIN: 11.9 g/dL — ABNORMAL LOW (ref 12.0–16.0)
LARGE UNSTAINED CELLS: 3 % (ref 0–4)
LYMPHOCYTES ABSOLUTE COUNT: 1 10*9/L — ABNORMAL LOW (ref 1.5–5.0)
MEAN CORPUSCULAR HEMOGLOBIN CONC: 33 g/dL (ref 31.0–37.0)
MEAN CORPUSCULAR HEMOGLOBIN: 27.6 pg (ref 26.0–34.0)
MEAN CORPUSCULAR VOLUME: 83.7 fL (ref 80.0–100.0)
MEAN PLATELET VOLUME: 8.1 fL (ref 7.0–10.0)
MONOCYTES ABSOLUTE COUNT: 0.6 10*9/L (ref 0.2–0.8)
PLATELET COUNT: 279 10*9/L (ref 150–440)
RED BLOOD CELL COUNT: 4.3 10*12/L (ref 4.00–5.20)
WBC ADJUSTED: 6.5 10*9/L (ref 4.5–11.0)

## 2017-11-01 LAB — RED CELL DISTRIBUTION WIDTH: Lab: 14.2

## 2017-11-01 LAB — MAGNESIUM: Magnesium:MCnc:Pt:Ser/Plas:Qn:: 1.4 — ABNORMAL LOW

## 2017-11-01 LAB — PROTEIN TOTAL: Protein:MCnc:Pt:Ser/Plas:Qn:: 6 — ABNORMAL LOW

## 2017-11-01 LAB — CALCIUM: Calcium:MCnc:Pt:Ser/Plas:Qn:: 9.8

## 2017-11-01 LAB — PHOSPHORUS
PHOSPHORUS: 4.7 mg/dL (ref 2.9–4.7)
Phosphate:MCnc:Pt:Ser/Plas:Qn:: 4.7

## 2017-11-01 MED ORDER — BENZONATATE 100 MG CAPSULE
ORAL_CAPSULE | Freq: Four times a day (QID) | ORAL | 1 refills | 0 days | Status: CP | PRN
Start: 2017-11-01 — End: 2018-01-25

## 2017-11-01 MED ORDER — VARICELLA-ZOSTER GLYCOE VACC-AS01B ADJ(PF) 50 MCG/0.5 ML IM SUSP, KIT
Freq: Once | INTRAMUSCULAR | 1 refills | 0 days | Status: CP
Start: 2017-11-01 — End: 2018-01-25

## 2017-11-01 NOTE — Unmapped (Addendum)
Try Tessalon Perles for cough. At pharmacy      Please go to your pharmacy for the Shingrix vaccine. The prescription has already been sent. Once you receive the vaccine, ask your pharmacy to send the information to [place] so we can update your record  [facility] Fax: [phone].    Common Questions:  What is shingles?  Shingles is a painful, blistering rash caused by the same virus that causes chicken pox. Shingles can cause permanent nerve pain and damage. Shingles occurs when hidden chicken pox virus in your body comes back. You are more likely to get shingles as you get older. Shingles cannot be passed from one person to another. However, someone with shingles can spread chicken pox.   Why should you get [facility]?  SHINGRIX is very effective at preventing shingles and the nerve pain associated with the shingles.   Who should get [facility]?  Everyone 50 and older should receive SHINGRIX, even if you have already had shingles or received ZOSTAVAX before. [facility] is not recommended for immunocompromised patients or those with a history of severe hypersensitivity to [facility] in the past.  When to get [facility]?  SHINGRIX is two shots given 2-6 months apart. You should get it if you are over 50, even if you have already had ZOSTAVAX (another shingles shot).  Where can I get [facility]?  Many pharmacies have [facility] and can give the shot including [facility], [employer], [address], [employer], [employer], [employer], [facility] and many others.   How much does SHINGRIX cost?  SHINGRIX may be covered by your insurance. Call your pharmacy to find out if [facility] is covered under your insurance and how much you will have to pay.  What are the side effects of SHINGRIX?  The most common side effects to [facility] are pain, redness or swelling at the injection site. Some patients also report fevers, chills or tiredness. Side effects last for 1-2 days, plan to receive the vaccine at a time that is convenient for you.    Want to know more about shingles or SHINGRIX?  Ask your pharmacist if you have more questions. If you want to know more, this website also has good information : [email or web address].cy

## 2017-11-01 NOTE — Unmapped (Signed)
Internal Medicine Clinic Visit    Reason for visit: 6 month follow up    A/P:      Sophia Davis was seen today for follow-up and uri.    Diagnoses and all orders for this visit:    Essential hypertension (RAF-HCC)  Systolic elevated, in setting of cough. Continue to monitor at home. No medication changes today.    Osteoporosis, unspecified osteoporosis type, unspecified pathological fracture presence  Continue fosamax. Referral for DEXA scan ordered.   -     Dexa Bone Density Skeletal; Future    Secondary hyperparathyroidism (CMS-HCC)  Want to make sure she is getting adequate vitamin D  -     PTH; Future    Granulomatosis with Polyangiitis  This led to renal failure and is now quiescent on immunosuppression and had kidney transplant.    Malignant neoplasm of cervix, unspecified site (CMS-HCC)  Treated with hysterectomy.  No additional screening needed.    Other orders  -     varicella-zoster gE-AS01B (PF) (SHINGRIX) injection; Inject 0.5 mL into the muscle once. for 1 dose  -     benzonatate (TESSALON PERLES) 100 MG capsule; Take 1 capsule (100 mg total) by mouth every six (6) hours as needed for cough.    Shingrix vaccine sent to her pharmacy.    Follow up in 6 months.  __________________________________________________________    HPI:  72 y.o. female with a history of kidney transplant, osteoporosis, cervical cancer, presents for 6 month follow up.    She has had a cold since the day after Christmas. Mainly she is experiencing a cough and runny nose. She also reports some associated watery, diarrhea (3-4x per day). No sinus drainage. She is not taking any medication as she was unsure what would be safe to take with her kidney transplant. No CP, SOB.      She has been more active in the summer walking with her sister, though she has stopped in the winter months. Remains in good spirits as always    No swelling in legs.  __________________________________________________________    Problem List:  Patient Active Problem List   Diagnosis   ??? Granulomatosis with Polyangiitis   ??? Herpes zoster   ??? Hypercholesterolemia   ??? Essential hypertension (RAF-HCC)   ??? Arteritis (CMS-HCC)   ??? Postherpetic polyneuropathy   ??? Status post total abdominal hysterectomy   ??? Osteoporosis   ??? Insomnia   ??? Stented coronary artery   ??? Secondary hyperparathyroidism (CMS-HCC)   ??? Kidney transplant 05/17/2013   ??? Immunosuppression (CMS-HCC)   ??? Cervical cancer (CMS-HCC)   ??? Influenza A       Medications:  Reviewed in EPIC  __________________________________________________________    Physical Exam:   Vital Signs:  Vitals:    11/01/17 0930   BP: 142/67   Pulse: 54   Temp: 36.4 ??C (97.5 ??F)   Weight: 59.4 kg (131 lb)   Height: 154.9 cm (5' 1)       Gen: Well appearing, NAD  CV: RRR, no murmurs  Pulm: CTA bilaterally, no crackles or wheezes  Abd: Soft, NTND, normal BS. No HSM.  Ext: No edema      I, MeKayla McLean, scribed this note in the presence of Sophia Davis Nelida Meuse, MD.    The documentation recorded by the scribe accurately reflects the service I personally performed and the decisions made by me.  Dolores Hoose, MD

## 2017-11-17 NOTE — Unmapped (Signed)
[facility] Refill Coordination Note  Specialty Medication(s): Prograf 0.5mg  & Myfortic 180mg   Additional Medications shipped: Chlorthalidone 25mg , Alendronate 70mg , Ranitidine 150mg , Losartan 50mg , Amlodipine 5mg , Meoprolol 25mg , Vitamin D & Atorvastatin 40mg     Sophia Davis, DOB: Jul 11, 1946  Phone: [phone] (home) , Alternate phone contact: N/A  Phone or address changes today?: No  All above HIPAA information was verified with patient.  Shipping Address: [address]  [place] [address]   Insurance changes? No    Completed refill call assessment today to schedule patient's medication shipment from the [facility] [phone]).      Confirmed the medication and dosage are correct and have not changed: Yes, regimen is correct and unchanged.    Confirmed patient started or stopped the following medications in the past month:  No, there are no changes reported at this time.    Are you tolerating your medication?:  Brylinn reports tolerating the medication.    ADHERENCE    (Below is required for Medicare Part B or Transplant patients only - per drug):   How many tablets were dispensed last month:     Myfortic 180 mg   Quantity filled last month: 120   # of tablets left on hand: 10 days    Prograf 0.5 mg   Quantity filled last month: 120   # of tablets left on hand: 10 days    Did you miss any doses in the past 4 weeks? No missed doses reported.    FINANCIAL/SHIPPING    Delivery Scheduled: Yes, Expected medication delivery date: 11/26/2017     Tagan did not have any additional questions at this time.    Delivery address validated in FSI scheduling system: Yes, address listed in FSI is correct.    We will follow up with patient monthly for standard refill processing and delivery.      Thank you,  Tamala Fothergill   [facility] Shared [facility] Specialty Technician

## 2017-11-25 MED ORDER — LOSARTAN 50 MG TABLET
ORAL_TABLET | Freq: Two times a day (BID) | ORAL | PRN refills | 0.00000 days | Status: CP
Start: 2017-11-25 — End: 2017-11-25

## 2017-11-25 MED ORDER — MYFORTIC 180 MG TABLET,DELAYED RELEASE
ORAL_TABLET | ORAL | 99 refills | 0.00000 days | Status: CP
Start: 2017-11-25 — End: 2017-11-25

## 2017-11-25 MED ORDER — METOPROLOL TARTRATE 25 MG TABLET: each | 3 refills | 0 days | Status: AC

## 2017-11-25 MED ORDER — MYFORTIC 180 MG TABLET,DELAYED RELEASE: tablet | 99 refills | 0 days | Status: AC

## 2017-11-25 MED ORDER — AMLODIPINE 5 MG TABLET: tablet | 3 refills | 0 days

## 2017-11-25 MED ORDER — MYCOPHENOLATE SODIUM 180 MG TABLET,DELAYED RELEASE
ORAL_TABLET | Freq: Two times a day (BID) | ORAL | PRN refills | 0.00000 days | Status: CP
Start: 2017-11-25 — End: 2017-11-25

## 2017-11-25 MED ORDER — AMLODIPINE 5 MG TABLET: 5 mg | tablet | Freq: Every evening | 3 refills | 0 days | Status: AC

## 2017-11-25 MED ORDER — ATORVASTATIN 40 MG TABLET
ORAL_TABLET | Freq: Every evening | ORAL | 3 refills | 0.00000 days | Status: CP
Start: 2017-11-25 — End: 2017-11-25

## 2017-11-25 MED ORDER — ATORVASTATIN 40 MG TABLET: tablet | 3 refills | 0 days | Status: AC

## 2017-11-25 MED ORDER — AMLODIPINE 5 MG TABLET
ORAL_TABLET | Freq: Every evening | ORAL | 3 refills | 0.00000 days | Status: CP
Start: 2017-11-25 — End: 2018-04-18

## 2017-11-25 MED ORDER — ATORVASTATIN 40 MG TABLET: 40 mg | tablet | Freq: Every evening | 3 refills | 0 days | Status: AC

## 2017-11-25 MED ORDER — MYFORTIC 180 MG TABLET,DELAYED RELEASE: tablet | 0 refills | 0 days | Status: AC

## 2017-11-25 MED ORDER — MYFORTIC 180 MG TABLET,DELAYED RELEASE: tablet | 99 refills | 0 days

## 2017-11-25 MED ORDER — LOSARTAN 50 MG TABLET: tablet | 0 refills | 0 days | Status: AC

## 2017-11-25 MED ORDER — METOPROLOL TARTRATE 25 MG TABLET
ORAL_TABLET | Freq: Two times a day (BID) | ORAL | 3 refills | 0.00000 days | Status: CP
Start: 2017-11-25 — End: 2017-11-25

## 2017-11-25 MED FILL — PROGRAF/0.5MG/CAP: PROGRAF/0.5MG/CAP | 30 days supply | Qty: 120 | Fill #1

## 2017-11-25 MED FILL — CHLORTHALIDONE/25MG/TABS: CHLORTHALIDONE/25MG/TABS | 30 days supply | Qty: 30 | Fill #4

## 2017-11-25 MED FILL — LOSARTAN/50MG/TABS: LOSARTAN/50MG/TABS | 90 days supply | Qty: 180 | Fill #0

## 2017-11-25 MED FILL — MYFORTIC/180MG/TAB: MYFORTIC/180MG/TAB | 30 days supply | Qty: 120 | Fill #0

## 2017-11-25 MED FILL — ALENDRONATE SODIUM/70MG/TABS: ALENDRONATE SODIUM/70MG/TABS | 28 days supply | Qty: 1 | Fill #1

## 2017-11-25 MED FILL — RANITIDINE HCL/150MG/TABS: RANITIDINE HCL/150MG/TABS | 30 days supply | Qty: 60 | Fill #2

## 2017-11-25 MED FILL — VITAMIN D-3/1000 U/: VITAMIN D-3/1000 U/ | 90 days supply | Qty: 90 | Fill #3

## 2017-11-25 NOTE — Unmapped (Signed)
Patient request for RX refill.

## 2017-11-26 MED FILL — AMLODIPINE/5MG/TABS: AMLODIPINE/5MG/TABS | 90 days supply | Qty: 90 | Fill #0

## 2017-11-26 MED FILL — ATORVASTATIN/40MG/TABS: ATORVASTATIN/40MG/TABS | 90 days supply | Qty: 90 | Fill #0

## 2017-11-26 MED FILL — METOPROLOL TART/25MG/TA: METOPROLOL TART/25MG/TA | 90 days supply | Qty: 90 | Fill #0

## 2017-11-29 ENCOUNTER — Ambulatory Visit: Admit: 2017-11-29 | Discharge: 2017-11-29 | Payer: MEDICARE

## 2017-11-29 DIAGNOSIS — M810 Age-related osteoporosis without current pathological fracture: Principal | ICD-10-CM

## 2017-12-20 NOTE — Unmapped (Signed)
[facility] Pharm[facility] Refill Coordination Note  Specialty Medication(s): Myfortic 180mg  & Prograf 0.5mg   Additional Medications shipped: Chlorthalidone 25mg  & Alendronate 70mg     Sophia Davis, DOB: Feb 03, 1946  Phone: [phone] (home) , Alternate phone contact: N/A  Phone or address changes today?: No  All above HIPAA information was verified with patient.  Shipping Address: [address]  [place] [address]   Insurance changes? No    Completed refill call assessment today to schedule patient's medication shipment from the [facility] [phone]).      Confirmed the medication and dosage are correct and have not changed: Yes, regimen is correct and unchanged.    Confirmed patient started or stopped the following medications in the past month:  No, there are no changes reported at this time.    Are you tolerating your medication?:  Sophia Davis reports tolerating the medication.    ADHERENCE    (Below is required for Medicare Part B or Transplant patients only - per drug):   How many tablets were dispensed last month:     Myfortic 180 mg   Quantity filled last month: 120   # of tablets left on hand: 10 days    Prograf 0.5 mg   Quantity filled last month: 120   # of tablets left on hand: 10 days    Did you miss any doses in the past 4 weeks? No missed doses reported.    FINANCIAL/SHIPPING    Delivery Scheduled: Yes, Expected medication delivery date: 12/27/2017     The patient will receive an FSI print out for each medication shipped and additional FDA Medication Guides as required.  Patient education from [name] or [name] may also be included in the shipment    Sophia Davis did not have any additional questions at this time.    Delivery address validated in FSI scheduling system: Yes, address listed in FSI is correct.    We will follow up with patient monthly for standard refill processing and delivery.      Thank you,  Tamala Fothergill   [facility] Shared [facility] Specialty Technician

## 2017-12-24 MED FILL — CHLORTHALIDONE/25MG/TABS: CHLORTHALIDONE/25MG/TABS | 30 days supply | Qty: 30 | Fill #5

## 2017-12-24 MED FILL — ALENDRONATE SODIUM/70MG/TABS: ALENDRONATE SODIUM/70MG/TABS | 28 days supply | Qty: 1 | Fill #2

## 2017-12-24 MED FILL — PROGRAF/0.5MG/CAP: PROGRAF/0.5MG/CAP | 30 days supply | Qty: 120 | Fill #2

## 2017-12-24 MED FILL — MYFORTIC/180MG/TAB: MYFORTIC/180MG/TAB | 30 days supply | Qty: 120 | Fill #1

## 2018-01-24 NOTE — Unmapped (Signed)
[employer] transplant nephrology clinic visit    assessment and plan  1. s/p kidney transplant 05/17/13. baseline creatinine 1-1.2 mg/dl. +donor specific hla class2 ab detected 09/2016. no proteinuria. no change in maintenance immunosuppression. tacrolimus 12hr lvl 4-7 ng/ml.  2. hypertension. continue chlorthalidone 25mg  daily and amlodipine. blood pressure goal <130/90 mmhg.   3. diarrhea. lactose intolerance vs myfortic induced. recc dec dairy. consider transition from myfortic to aza if symptoms do not improve   3. preventive medicine/vaccination. influenza '17. pcv13 pneumococcal '14. ppsv23 pneumococcal '16. nl mammogram '18. nl gyn exam '13 with no hx abnl pap sm. nl cystoscopy '14. nl colonoscopy '10. nl dermatology appt '16 with follow up appt recommended. received shingrix '19 with scheduled 6 month booster     history of present illness    [name] is a 72 y.o.woman seen in follow up post kidney transplant 05/17/2013. she feels well. complaining of 2 month history of diarrhea, worsened after eating, watery in appearence. admits to eating inc amt of sweets/ice cream. reports 2-3 episodes daily. no black tarry stool. +dec appetite, eating approximately 1 meal/day. weight stable. +abdominal cramping associated with episodes of diarrhea. denies nausea, vomiting. otherwise, patient feeling well. +occ sob with inc exertion. notes le edema with walking/standing for long periods of time. resolved after rest. bp elevated today in clinic, reports at home pressure stable, 120-130/70. remains on amlodipine and chlorthalidone. no fevers, chills, or sweats. no chest pain, palpitations, orthopnea, or shortness of breath. no abd pain. +diarrhea after eating food. no dysuria, hematuria, or difficulty voiding. no headache, dizziness, or lightheadedness. no paresthesias, weakness, or tremor. all other systems reviewed and negative x10 systems.    past medical hx:   1. s/p deceased donor kidney transplant 05/17/2013. mpo-anca+ chronic gn. alemtuzumab induction. baseline creatinine 1-1.2 mg/dl.  2. mpo-anca+ vasculitis/granulomatosis with polyangiitis. hx pulmonary hemorrhage. trtmt plasma exchange, iv methylprednisolone, iv/po cyclophosphamide x total 96m '03-'07; rituximab 1gm x2 '07.  3. hypertension   4. coronary artery disease s/p pci/stent '97. nl cards stress '13: nl lvef > 65% no myocardial ischemia   5. hyperlipidemia  6. osteoporosis  7. secondary/tertiary hyperparathyroidism    past surgical hx: total abdominal hysterectomy '98. 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 kidney transplant '14. right hand 3rd/4th trigger finger release '16.    allergies: nkda     medications: tacrolimus 1mg  bid, mycophenolate sodium 360mg  bid, losartan 50mg  bid, metoprolol 12.5mg  bid, atorvastatin 40mg  q.pm, aspirin 81mg  daily, vitamin d3 1000iu daily, alendronate 70mg  q.wk, albuterol inhaler prn; amlodipine 5mg  q.pm; ranitidine 150mg  bid-as needed, chlorthalidone 25mg  q.am    soc hx: widowed x3 children. former smoking hx; none > 40 yrs.     physical exam: BP 160/72  - Pulse 60  - Temp 36.4 ??C (97.5 ??F) (Temporal)  - Ht 154.9 cm (5' 0.98)  - Wt 59.4 kg (131 lb)  - BMI 24.77 kg/m??  wd/wn woman nl/appropriate affect and mood. nl sclera anicteric. mmm no thrush +denture plates. neck supple no palpable ln. heart rrr nl s1s2 no m/r/g. lungs clear bilateral. abd soft nt/nd. no extremity edema. msk no synovitis/tophi. skin no rash. neuro alert oriented non focal exam.    labs:  Lab Results   Component Value Date    WBC 6.4 01/25/2018    RBC 4.30 01/25/2018    HGB 12.2 01/25/2018    HCT 36.1 01/25/2018    MCV 84.0 01/25/2018    MCH 28.2 01/25/2018    MCHC  33.6 01/25/2018    RDW 13.5 01/25/2018    PLT 274 01/25/2018     Lab Results   Component Value Date    NA 132 (L) 11/01/2017    K 3.8 11/01/2017    CL 93 (L) 11/01/2017    CO2 28.0 11/01/2017    BUN 12 11/01/2017    CREATININE 1.05 (H) 11/01/2017    GLU 110 (H) 11/01/2017    CALCIUM 9.7 11/01/2017    CALCIUM 9.8 11/01/2017    ALBUMIN 4.1 11/01/2017    PHOS 4.7 11/01/2017     Lab Results   Component Value Date    A1C 5.8 06/10/2016     Lab Results   Component Value Date    ALKPHOS 73 11/01/2017    BILITOT 0.7 11/01/2017    BILIDIR <0.1 12/18/2013    PROT 6.0 (L) 11/01/2017    ALBUMIN 4.1 11/01/2017    ALT 23 11/01/2017    AST 20 11/01/2017     scribe's attestation: Elwyn Lade, MD obtained and performed the history, physical exam and medical decision making elements that were entered into the chart.  Signed by Jerl Santos, Scribe, on January 25, 2018 at 10:04 AM.    ----------------------------------------------------------------------------------------------------------------------  January 25, 2018 3:12 PM. documentation assistance provided by the scribe. i was present during the time the encounter was recorded. the information recorded by the scribe was done at my direction and has been reviewed and validated by me.  ----------------------------------------------------------------------------------------------------------------------

## 2018-01-24 NOTE — Unmapped (Signed)
[facility] Pharmacy Refill Coordination Note  Specialty Medication(s): Myfortic 180mg  & Prograf 0.5mg   Additional Medications shipped: Alendronate 70mg  & Chlorthalidone 25mg     [name], DOB: 1946-09-28  Phone: [phone] (home) , Alternate phone contact: N/A  Phone or address changes today?: No  All above HIPAA information was verified with patient.  Shipping Address: [address]  [place] [address]   Insurance changes? No    Completed refill call assessment today to schedule patient's medication shipment from the [facility] [phone]).      Confirmed the medication and dosage are correct and have not changed: Yes, regimen is correct and unchanged.    Confirmed patient started or stopped the following medications in the past month:  No, there are no changes reported at this time.    Are you tolerating your medication?:  Yalanda reports tolerating the medication.    ADHERENCE    (Below is required for Medicare Part B or Transplant patients only - per drug):   How many tablets were dispensed last month:     Myfortic 180 mg   Quantity filled last month: 120   # of tablets left on hand: 10 DAYS    Prograf 0.5 mg   Quantity filled last month: 120   # of tablets left on hand: 10 DAYS    Did you miss any doses in the past 4 weeks? No missed doses reported.    FINANCIAL/SHIPPING    Delivery Scheduled: Yes, Expected medication delivery date: 01/31/2018     The patient will receive an FSI print out for each medication shipped and additional FDA Medication Guides as required.  Patient education from [place] or [name] may also be included in the shipment    Valia did not have any additional questions at this time.    Delivery address validated in FSI scheduling system: Yes, address listed in FSI is correct.    We will follow up with patient monthly for standard refill processing and delivery.      Thank you,  Tamala Fothergill   [facility] Shared [facility] Specialty Technician

## 2018-01-25 ENCOUNTER — Ambulatory Visit: Admit: 2018-01-25 | Discharge: 2018-01-25 | Payer: MEDICARE

## 2018-01-25 ENCOUNTER — Ambulatory Visit: Admit: 2018-01-25 | Discharge: 2018-01-25 | Payer: MEDICARE | Attending: Nephrology | Primary: Nephrology

## 2018-01-25 DIAGNOSIS — R197 Diarrhea, unspecified: Secondary | ICD-10-CM

## 2018-01-25 DIAGNOSIS — Z940 Kidney transplant status: Principal | ICD-10-CM

## 2018-01-25 DIAGNOSIS — D899 Disorder involving the immune mechanism, unspecified: Secondary | ICD-10-CM

## 2018-01-25 DIAGNOSIS — R894 Abnormal immunological findings in specimens from other organs, systems and tissues: Secondary | ICD-10-CM

## 2018-01-25 LAB — COMPREHENSIVE METABOLIC PANEL
ALBUMIN: 4.4 g/dL (ref 3.5–5.0)
ALKALINE PHOSPHATASE: 76 U/L (ref 38–126)
ALT (SGPT): 24 U/L (ref 15–48)
ANION GAP: 11 mmol/L (ref 9–15)
AST (SGOT): 19 U/L (ref 14–38)
BILIRUBIN TOTAL: 0.9 mg/dL (ref 0.0–1.2)
BLOOD UREA NITROGEN: 18 mg/dL (ref 7–21)
BUN / CREAT RATIO: 15
CALCIUM: 9.5 mg/dL (ref 8.5–10.2)
CHLORIDE: 93 mmol/L — ABNORMAL LOW (ref 98–107)
CO2: 28 mmol/L (ref 22.0–30.0)
CREATININE: 1.18 mg/dL — ABNORMAL HIGH (ref 0.60–1.00)
EGFR MDRD AF AMER: 55 mL/min/{1.73_m2} — ABNORMAL LOW (ref >=60)
EGFR MDRD NON AF AMER: 45 mL/min/{1.73_m2} — ABNORMAL LOW (ref >=60)
GLUCOSE RANDOM: 92 mg/dL (ref 65–99)
POTASSIUM: 3.3 mmol/L — ABNORMAL LOW (ref 3.5–5.0)
SODIUM: 132 mmol/L — ABNORMAL LOW (ref 135–145)

## 2018-01-25 LAB — CBC W/ AUTO DIFF
BASOPHILS ABSOLUTE COUNT: 0 10*9/L (ref 0.0–0.1)
BASOPHILS RELATIVE PERCENT: 0.6 %
EOSINOPHILS ABSOLUTE COUNT: 0.2 10*9/L (ref 0.0–0.4)
EOSINOPHILS RELATIVE PERCENT: 3.3 %
HEMATOCRIT: 36.1 % (ref 36.0–46.0)
HEMOGLOBIN: 12.2 g/dL (ref 12.0–16.0)
LARGE UNSTAINED CELLS: 2 % (ref 0–4)
LYMPHOCYTES RELATIVE PERCENT: 18.3 %
MEAN CORPUSCULAR HEMOGLOBIN CONC: 33.6 g/dL (ref 31.0–37.0)
MEAN PLATELET VOLUME: 8.3 fL (ref 7.0–10.0)
MONOCYTES ABSOLUTE COUNT: 0.5 10*9/L (ref 0.2–0.8)
MONOCYTES RELATIVE PERCENT: 8.1 %
NEUTROPHILS ABSOLUTE COUNT: 4.3 10*9/L (ref 2.0–7.5)
NEUTROPHILS RELATIVE PERCENT: 67.4 %
PLATELET COUNT: 274 10*9/L (ref 150–440)
RED BLOOD CELL COUNT: 4.3 10*12/L (ref 4.00–5.20)
RED CELL DISTRIBUTION WIDTH: 13.5 % (ref 12.0–15.0)
WBC ADJUSTED: 6.4 10*9/L (ref 4.5–11.0)

## 2018-01-25 LAB — URINALYSIS
BACTERIA: NONE SEEN /HPF
BILIRUBIN UA: NEGATIVE
BLOOD UA: NEGATIVE
GLUCOSE UA: NEGATIVE
KETONES UA: NEGATIVE
LEUKOCYTE ESTERASE UA: NEGATIVE
PH UA: 6 (ref 5.0–9.0)
PROTEIN UA: NEGATIVE
RBC UA: 1 /HPF (ref <=4)
SPECIFIC GRAVITY UA: 1.009 (ref 1.003–1.030)
UROBILINOGEN UA: 0.2
WBC UA: <1 /HPF (ref 0–5)

## 2018-01-25 LAB — LIPID PANEL
CHOLESTEROL: 121 mg/dL (ref 100–199)
HDL CHOLESTEROL: 46 mg/dL (ref 40–59)
LDL CHOLESTEROL CALCULATED: 55 mg/dL — ABNORMAL LOW (ref 60–99)
NON-HDL CHOLESTEROL: 75 mg/dL
TRIGLYCERIDES: 100 mg/dL (ref 1–149)
VLDL CHOLESTEROL CAL: 20 mg/dL (ref 11–41)

## 2018-01-25 LAB — CHOLESTEROL: Cholesterol:MCnc:Pt:Ser/Plas:Qn:: 121

## 2018-01-25 LAB — PROTEIN / CREATININE RATIO, URINE: PROTEIN URINE: <4 mg/dL

## 2018-01-25 LAB — PROTEIN TOTAL: Protein:MCnc:Pt:Ser/Plas:Qn:: 7

## 2018-01-25 LAB — CALCIUM: Calcium:MCnc:Pt:Ser/Plas:Qn:: 9.5

## 2018-01-25 LAB — MEAN CORPUSCULAR VOLUME: Lab: 84

## 2018-01-25 LAB — CREATININE, URINE: Lab: 90

## 2018-01-25 LAB — VITAMIN D, TOTAL (25OH): Lab: 41.3

## 2018-01-25 LAB — PHOSPHORUS: Phosphate:MCnc:Pt:Ser/Plas:Qn:: 3.1

## 2018-01-25 LAB — MAGNESIUM: Magnesium:MCnc:Pt:Ser/Plas:Qn:: 1.7

## 2018-01-25 LAB — TACROLIMUS, TROUGH: Lab: 4.4

## 2018-01-25 LAB — COLOR

## 2018-01-25 MED ORDER — TRAZODONE 50 MG TABLET
ORAL_TABLET | Freq: Every evening | ORAL | 0 refills | 0 days | Status: CP | PRN
Start: 2018-01-25 — End: 2018-04-18

## 2018-01-26 LAB — CMV VIRAL LD: Lab: DETECTED — AB

## 2018-01-26 LAB — CMV DNA, QUANTITATIVE, PCR: CMV VIRAL LD: DETECTED — AB

## 2018-01-28 LAB — VITAMIN D 1,25-DIHYDROXY: 1,25-Dihydroxyvitamin D:MCnc:Pt:Ser/Plas:Qn:: 54

## 2018-01-28 MED FILL — ALENDRONATE SODIUM/70MG/TABS: ALENDRONATE SODIUM/70MG/TABS | 28 days supply | Qty: 1 | Fill #3

## 2018-01-28 MED FILL — CHLORTHALIDONE/25MG/TABS: CHLORTHALIDONE/25MG/TABS | 30 days supply | Qty: 30 | Fill #6

## 2018-01-28 MED FILL — MYFORTIC/180MG/TAB: MYFORTIC/180MG/TAB | 30 days supply | Qty: 120 | Fill #2

## 2018-01-28 MED FILL — PROGRAF/0.5MG/CAP: PROGRAF/0.5MG/CAP | 30 days supply | Qty: 120 | Fill #3

## 2018-02-01 LAB — CLASS 2 ANTIBODIES IDENTIFIED: Lab: 01:01 {titer}

## 2018-02-01 LAB — FSAB CLASS 2 ANTIBODY SPECIFICITY
CLASS 2 ANTIBODIES IDENTIFIED: 01:01 {titer}
HLA CL2 AB RESULT: POSITIVE

## 2018-02-01 LAB — HLA DS POST TRANSPLANT
ANTI-DONOR DRW #1 MFI: 181 MFI
ANTI-DONOR DRW #2 MFI: 1523 MFI — ABNORMAL HIGH
ANTI-DONOR HLA-A #1 MFI: 7 MFI
ANTI-DONOR HLA-B #1 MFI: 4 MFI
ANTI-DONOR HLA-C #1 MFI: 0 MFI
ANTI-DONOR HLA-C #2 MFI: 1 MFI
ANTI-DONOR HLA-DQB #1 MFI: 47 MFI
ANTI-DONOR HLA-DQB #2 MFI: 0 MFI
ANTI-DONOR HLA-DR #1 MFI: 17 MFI
ANTI-DONOR HLA-DR #2 MFI: 92 MFI
DONOR DRW ANTIGEN #1: 52
DONOR DRW ANTIGEN #2: 53
DONOR HLA-A ANTIGEN #1: 2
DONOR HLA-B ANTIGEN #2: 44
DONOR HLA-C ANTIGEN #1: 5
DONOR HLA-DQB ANTIGEN #1: 7
DONOR HLA-DQB ANTIGEN #2: 9
DONOR HLA-DR ANTIGEN #1: 9
DONOR HLA-DR ANTIGEN #2: 12

## 2018-02-01 LAB — HLA CL1 ANTIBODY COMM: Lab: 0

## 2018-02-01 LAB — FSAB CLASS 1 ANTIBODY SPECIFICITY

## 2018-02-01 LAB — ANTI-DONOR HLA-C #1 MFI: Lab: 0

## 2018-02-22 MED ORDER — CHOLECALCIFEROL (VITAMIN D3) 25 MCG (1,000 UNIT) CAPSULE
ORAL_CAPSULE | Freq: Every day | ORAL | 3 refills | 0 days | Status: CP
Start: 2018-02-22 — End: 2018-02-23

## 2018-02-22 NOTE — Unmapped (Addendum)
[facility] Special[facility] Pharmacy Refill and Clinical Coordination Note  Medication(s): myfortic, prograf  PT STATES DOES NOT NEED RANITIDINE AT THIS TIME    Sophia Davis, DOB: [birth year]  Phone: [phone] (home) , Alternate phone contact: N/A  Shipping address: [address]  [place] [address]  Phone or address changes today?: No  All above HIPAA information verified.  Insurance changes? No    Completed refill and clinical call assessment today to schedule patient's medication shipment from the [facility] [phone]).      MEDICATION RECONCILIATION    Confirmed the medication and dosage are correct and have not changed: Yes, regimen is correct and unchanged.    Were there any changes to your medication(s) in the past month:  No, there are no changes reported at this time.    ADHERENCE    Is this medicine transplant or covered by Medicare Part B? Yes.    Myfortic 180 mg   Quantity filled last month: 120   # of tablets left on hand: 7 DAYS LEFT  Prograf 0.5 mg   Quantity filled last month: 120   # of tablets left on hand: 7 DAYS LEFT    Did you miss any doses in the past 4 weeks? No missed doses reported.  Adherence counseling provided? Not needed     SIDE EFFECT MANAGEMENT    Are you tolerating your medication?:  Sophia Davis reports tolerating the medication.  Side effect management discussed: None      Therapy is appropriate and should be continued.    Evidence of clinical benefit: See Epic note from 07/27/17      FINANCIAL/SHIPPING    Delivery Scheduled: Yes, Expected medication delivery date: 02/28/18   Additional medications refilled: ALENDRONATE, CHLORTHALIDONE, ATORVASTATIN, AMLODIPINE, METOPROLOL, LOSARTAN, VITAMIN D (NEEDS REFILLS)-9RX    The patient will receive an FSI print out for each medication shipped and additional FDA Medication Guides as required.  Patient education from [place] or [name] may also be included in the shipment.    Sophia Davis did not have any additional questions at this time.    Delivery address validated in FSI scheduling system: Yes, address listed above is correct.      We will follow up with patient monthly for standard refill processing and delivery.      Thank you,  Thad Ranger   [facility] Shared [facility] Specialty Pharmacist

## 2018-02-22 NOTE — Unmapped (Signed)
Pt request for RX Refill

## 2018-02-23 MED ORDER — CHOLECALCIFEROL (VITAMIN D3) 25 MCG (1,000 UNIT) TABLET
ORAL_TABLET | ORAL | 3 refills | 0.00000 days | Status: CP
Start: 2018-02-23 — End: 2018-02-23

## 2018-02-23 MED ORDER — CHOLECALCIFEROL (VITAMIN D3) 25 MCG (1,000 UNIT) CAPSULE
ORAL_CAPSULE | Freq: Every day | ORAL | 3 refills | 0.00000 days | Status: CP
Start: 2018-02-23 — End: 2018-05-30

## 2018-02-23 MED ORDER — CHOLECALCIFEROL (VITAMIN D3) 25 MCG (1,000 UNIT) TABLET: tablet | 3 refills | 0 days | Status: AC

## 2018-02-25 MED FILL — PROGRAF/0.5MG/CAP: PROGRAF/0.5MG/CAP | 30 days supply | Qty: 120 | Fill #4

## 2018-02-25 MED FILL — ALENDRONATE SODIUM/70MG/TABS: ALENDRONATE SODIUM/70MG/TABS | 28 days supply | Qty: 1 | Fill #4

## 2018-02-25 MED FILL — LOSARTAN/50MG/TABS: LOSARTAN/50MG/TABS | 90 days supply | Qty: 180 | Fill #1

## 2018-02-25 MED FILL — MYFORTIC/180MG/TAB: MYFORTIC/180MG/TAB | 30 days supply | Qty: 120 | Fill #3

## 2018-02-25 MED FILL — ATORVASTATIN/40MG/TABS: ATORVASTATIN/40MG/TABS | 90 days supply | Qty: 90 | Fill #1

## 2018-02-25 MED FILL — VITAMIN D-3/1000 U/: VITAMIN D-3/1000 U/ | 90 days supply | Qty: 90 | Fill #0

## 2018-02-25 MED FILL — AMLODIPINE/5MG/TABS: AMLODIPINE/5MG/TABS | 90 days supply | Qty: 90 | Fill #1

## 2018-02-25 MED FILL — METOPROLOL TARTRATE/25MG/TABS: METOPROLOL TARTRATE/25MG/TABS | 90 days supply | Qty: 90 | Fill #1

## 2018-02-25 MED FILL — CHLORTHALIDONE/25MG/TABS: CHLORTHALIDONE/25MG/TABS | 30 days supply | Qty: 30 | Fill #7

## 2018-03-23 NOTE — Unmapped (Signed)
[facility] Specialty Pharmacy Refill and Clinical Coordination Note  Medication(s): PROGRAF, MYFORTIC  Pt does not need ANY meds other than those listed in this note, went over entire profile, pt has plenty of zantac.    Sophia Davis, DOB: 02-10-1946  Phone: [phone] (home) , Alternate phone contact: N/A  Shipping address: [address]  [place] [address]  Phone or address changes today?: No  All above HIPAA information verified.  Insurance changes? No    Completed refill and clinical call assessment today to schedule patient's medication shipment from the [facility] [phone]).      MEDICATION RECONCILIATION    Confirmed the medication and dosage are correct and have not changed: Yes, regimen is correct and unchanged.    Were there any changes to your medication(s) in the past month:  No, there are no changes reported at this time.    ADHERENCE    Is this medicine transplant or covered by Medicare Part B? Yes.    Prograf 0.5 mg   Quantity filled last month: 120   # of tablets left on hand: 7 DAYS LEFT      Myfortic 180 mg   Quantity filled last month: 120   # of tablets left on hand: 7 DAYS LEFT      Did you miss any doses in the past 4 weeks? No missed doses reported.  Adherence counseling provided? Not needed     SIDE EFFECT MANAGEMENT    Are you tolerating your medication?:  Sophia Davis reports tolerating the medication.  Side effect management discussed: None      Therapy is appropriate and should be continued.    Evidence of clinical benefit: See Epic note from 01/25/18      FINANCIAL/SHIPPING    Delivery Scheduled: Yes, Expected medication delivery date: 03/24/18 VIA [facility]   Additional medications refilled: CHLORTHALIDONE, ALENDRONATE-4RX TOTAL    The patient will receive an FSI print out for each medication shipped and additional FDA Medication Guides as required.  Patient education from [place] or [name] may also be included in the shipment.    Sophia Davis did not have any additional questions at this time.    Delivery address validated in FSI scheduling system: Yes, address listed above is correct.      We will follow up with patient monthly for standard refill processing and delivery.      Thank you,  Thad Ranger   [facility] Shared [facility] Specialty Pharmacist

## 2018-03-23 NOTE — Unmapped (Signed)
ERROR

## 2018-03-24 MED FILL — PROGRAF/0.5MG/CAP: PROGRAF/0.5MG/CAP | 30 days supply | Qty: 120 | Fill #5

## 2018-03-24 MED FILL — MYFORTIC/180MG/TAB: MYFORTIC/180MG/TAB | 30 days supply | Qty: 120 | Fill #4

## 2018-03-24 MED FILL — ALENDRONATE SODIUM/70MG/TABS: ALENDRONATE SODIUM/70MG/TABS | 28 days supply | Qty: 1 | Fill #5

## 2018-03-24 MED FILL — CHLORTHALIDONE/25MG/TABS: CHLORTHALIDONE/25MG/TABS | 30 days supply | Qty: 30 | Fill #8

## 2018-04-13 NOTE — Unmapped (Signed)
Your patient has scheduled  visit appointment   on 04/18/18.  Please place any lab order for the  to be performed.  Please let me know if I can do anything to help you.  Thank you.

## 2018-04-14 NOTE — Unmapped (Signed)
[facility] Special[facility] Pharmacy Refill Coordination Note    Specialty Medication(s) to be Shipped:   Transplant: Myfortic 180mg  and Prograf 0.5mg     Other medication(s) to be shipped: Sophia Davis, DOB: 1946/03/14  Phone: [phone] (home)   Shipping Address: [address]  [place] [address]    All above HIPAA information was verified with patient.     Completed refill call assessment today to schedule patient's medication shipment from the [facility] [phone]).       Specialty medication(s) and dose(s) confirmed: Regimen is correct and unchanged.   Changes to medications: Leslee reports no changes reported at this time.  Changes to insurance: No  Questions for the pharmacist: No    The patient will receive an FSI print out for each medication shipped and additional FDA Medication Guides as required.  Patient education from [place] or [name] may also be included in the shipment.    DISEASE-SPECIFIC INFORMATION        N/A    ADHERENCE              MEDICARE PART B DOCUMENTATION     Myfortic 180mg : Patient has 28 tablets on hand.  Prograf 0.5mg : Patient has 28 capsules on hand.    SHIPPING     Shipping address confirmed in FSI.     Delivery Scheduled: Yes, Expected medication delivery date: [id number] [facility] via UPS or courier.     Antonietta Barcelona   [facility] Shared [facility] Specialty Technician

## 2018-04-15 NOTE — Unmapped (Signed)
Patient AWV scheduled for 06/28/18 with Melvenia Beam Sibenge **SS**      Outreach flow sheet completed: Yes.    Patient Due for: None    Were the above open gaps addressed:No.    Patient contact did not originate through upfront.

## 2018-04-18 ENCOUNTER — Ambulatory Visit: Admit: 2018-04-18 | Discharge: 2018-04-18 | Payer: MEDICARE

## 2018-04-18 DIAGNOSIS — R894 Abnormal immunological findings in specimens from other organs, systems and tissues: Secondary | ICD-10-CM

## 2018-04-18 DIAGNOSIS — Z955 Presence of coronary angioplasty implant and graft: Secondary | ICD-10-CM

## 2018-04-18 DIAGNOSIS — D899 Disorder involving the immune mechanism, unspecified: Secondary | ICD-10-CM

## 2018-04-18 DIAGNOSIS — Z940 Kidney transplant status: Secondary | ICD-10-CM

## 2018-04-18 DIAGNOSIS — E7800 Pure hypercholesterolemia, unspecified: Secondary | ICD-10-CM

## 2018-04-18 DIAGNOSIS — I776 Arteritis, unspecified: Secondary | ICD-10-CM

## 2018-04-18 DIAGNOSIS — I10 Essential (primary) hypertension: Principal | ICD-10-CM

## 2018-04-18 LAB — COMPREHENSIVE METABOLIC PANEL
ALBUMIN: 4 g/dL (ref 3.5–5.0)
ALKALINE PHOSPHATASE: 61 U/L (ref 38–126)
ALT (SGPT): 17 U/L (ref 15–48)
ANION GAP: 9 mmol/L (ref 9–15)
BILIRUBIN TOTAL: 0.7 mg/dL (ref 0.0–1.2)
BLOOD UREA NITROGEN: 17 mg/dL (ref 7–21)
BUN / CREAT RATIO: 15
CALCIUM: 9.4 mg/dL (ref 8.5–10.2)
CHLORIDE: 98 mmol/L (ref 98–107)
CO2: 28 mmol/L (ref 22.0–30.0)
CREATININE: 1.17 mg/dL — ABNORMAL HIGH (ref 0.60–1.00)
EGFR CKD-EPI AA FEMALE: 53 mL/min/{1.73_m2} — ABNORMAL LOW (ref >=60)
EGFR CKD-EPI NON-AA FEMALE: 46 mL/min/{1.73_m2} — ABNORMAL LOW (ref >=60)
GLUCOSE RANDOM: 105 mg/dL (ref 65–179)
POTASSIUM: 3.8 mmol/L (ref 3.5–5.0)
PROTEIN TOTAL: 6.3 g/dL — ABNORMAL LOW (ref 6.5–8.3)
SODIUM: 135 mmol/L (ref 135–145)

## 2018-04-18 LAB — CBC W/ AUTO DIFF
BASOPHILS ABSOLUTE COUNT: 0 10*9/L (ref 0.0–0.1)
BASOPHILS RELATIVE PERCENT: 0.6 %
EOSINOPHILS ABSOLUTE COUNT: 0.3 10*9/L (ref 0.0–0.4)
EOSINOPHILS RELATIVE PERCENT: 3.7 %
LARGE UNSTAINED CELLS: 3 % (ref 0–4)
LYMPHOCYTES ABSOLUTE COUNT: 1.5 10*9/L (ref 1.5–5.0)
LYMPHOCYTES RELATIVE PERCENT: 22.1 %
MEAN CORPUSCULAR HEMOGLOBIN CONC: 32.5 g/dL (ref 31.0–37.0)
MEAN CORPUSCULAR HEMOGLOBIN: 27.7 pg (ref 26.0–34.0)
MEAN CORPUSCULAR VOLUME: 85.1 fL (ref 80.0–100.0)
MEAN PLATELET VOLUME: 8.1 fL (ref 7.0–10.0)
MONOCYTES ABSOLUTE COUNT: 0.5 10*9/L (ref 0.2–0.8)
MONOCYTES RELATIVE PERCENT: 7 %
NEUTROPHILS ABSOLUTE COUNT: 4.4 10*9/L (ref 2.0–7.5)
NEUTROPHILS RELATIVE PERCENT: 63.9 %
PLATELET COUNT: 289 10*9/L (ref 150–440)
RED BLOOD CELL COUNT: 4.13 10*12/L (ref 4.00–5.20)
RED CELL DISTRIBUTION WIDTH: 13.8 % (ref 12.0–15.0)
WBC ADJUSTED: 6.9 10*9/L (ref 4.5–11.0)

## 2018-04-18 LAB — MAGNESIUM: Magnesium:MCnc:Pt:Ser/Plas:Qn:: 1.5 — ABNORMAL LOW

## 2018-04-18 LAB — LARGE UNSTAINED CELLS: Lab: 3

## 2018-04-18 LAB — PHOSPHORUS: Phosphate:MCnc:Pt:Ser/Plas:Qn:: 3.7

## 2018-04-18 LAB — TACROLIMUS, TROUGH: Lab: 5.5

## 2018-04-18 LAB — AST (SGOT): Aspartate aminotransferase:CCnc:Pt:Ser/Plas:Qn:: 17

## 2018-04-18 MED ORDER — AMLODIPINE 10 MG TABLET: tablet | 3 refills | 0 days | Status: AC

## 2018-04-18 MED ORDER — AMLODIPINE 10 MG TABLET: 10 mg | tablet | Freq: Every evening | 3 refills | 0 days | Status: AC

## 2018-04-18 MED ORDER — AMLODIPINE 10 MG TABLET
ORAL_TABLET | Freq: Every evening | ORAL | 3 refills | 0.00000 days | Status: CP
Start: 2018-04-18 — End: 2018-10-24

## 2018-04-18 MED FILL — ALENDRONATE SODIUM/70MG/TABS: ALENDRONATE SODIUM/70MG/TABS | 28 days supply | Qty: 1 | Fill #6

## 2018-04-18 MED FILL — MYFORTIC/180MG/TAB: MYFORTIC/180MG/TAB | 30 days supply | Qty: 120 | Fill #5

## 2018-04-18 MED FILL — PROGRAF/0.5MG/CAP: PROGRAF/0.5MG/CAP | 30 days supply | Qty: 120 | Fill #6

## 2018-04-18 MED FILL — CHLORTHALIDONE/25MG/TABS: CHLORTHALIDONE/25MG/TABS | 30 days supply | Qty: 30 | Fill #9

## 2018-04-18 NOTE — Unmapped (Signed)
Sophia Davis saw you this morning and you told her to increase her dosage of Amlodipine, but Sophia Davis got to looking and found a paper where Dr. Toni Arthurs told her to go off of this in 07/2017, so Sophia Davis is wondering if you want her to start this now.  Her # is [phone]

## 2018-04-18 NOTE — Unmapped (Addendum)
Internal Medicine Clinic Visit    Reason for visit: Follow-up chronic kidney disease immunosuppression and coronary disease    A/P:        Sophia Davis was seen today for follow-up.    Diagnoses and all orders for this visit:    Essential hypertension (RAF-HCC)  Her blood pressure is inadequately controlled right now.  And her heart rate is too low.  She has been off amlodipine since October 2018 even though she has been getting it refilled.  Will start back on amlodipine 5mg .  And we will stop her metoprolol for now.  She will measure her blood pressure and heart rate every day for the next week and call me with the result next Monday.  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 Toni Arthurs  Hypercholesterolemia  Continue treatment for her known coronary disease.  She had a stent over 10 years ago.  Stented coronary artery  Continue treatment with aspirin and statin          __________________________________________________________    HPI:    72 y.o. female here for for follow-up.  She generally feels well and is able to do all the things that she wants to.  She lives with her son in a trailer.  She does a lot of gardening.  She walks her dog every day and has no limitations in that regard.  No episodes of chest pain or shortness of breath.  She is overall feeling very well.    On check-in today she had a heart rate of 48.  She says she checks her blood pressure at home and her blood pressure has generally been good and her heart rate is always above 50.  The 48 caused her some alarm.  She was asymptomatic in clinic however.  __________________________________________________________      Medications:  See below.    __________________________________________________________    Physical Exam:   Vital Signs:  Vitals:    04/18/18 0939 BP: 164/77   Pulse: (!) 48   Weight: 58.5 kg (129 lb)   Height: 154.9 cm (5' 1)       Gen: Well appearing, NAD  CV: RRR, no murmurs  Pulm: CTA bilaterally, no crackles or wheezes  Pulses: 2+ radial  Ext: No edema         Your Medication List           Accurate as of 04/18/18  3:36 PM. If you have any questions, ask your nurse or doctor.               STOP taking these medications    traZODone 50 MG tablet  Commonly known as:  DESYREL  Stopped by:  Rockne Menghini, MD        CHANGE how you take these medications    amLODIPine 10 MG tablet  Commonly known as:  NORVASC  Take 1 tablet (10 mg total) by mouth every evening.  What changed:    ?? medication strength  ?? how much to take  Changed by:  Rockne Menghini, MD        CONTINUE taking these medications    albuterol 90 mcg/actuation inhaler  Commonly known as:  PROVENTIL HFA;VENTOLIN HFA  Inhale 2 puffs every six (6) hours as needed for wheezing or shortness of breath (Cough).  alendronate 70 MG tablet  Commonly known as:  FOSAMAX  TAKE 1 TABLET (70 MG) BY MOUTH EVERY 7 DAYS WITH A FULL GLASS OF WATER. DO NOT LIE DOWN FOR 30 MINUTES.     aspirin 81 MG chewable tablet  Chew 81 mg daily.     atorvastatin 40 MG tablet  Commonly known as:  LIPITOR  Take 1 tablet (40 mg total) by mouth every evening.     chlorthalidone 25 MG tablet  Commonly known as:  HYGROTON  Take 1 tablet (25 mg total) by mouth every morning.     cholecalciferol (vitamin D3) 1,000 unit capsule  Take 1 capsule (1,000 Units total) by mouth daily.     losartan 50 MG tablet  Commonly known as:  COZAAR  Take 1 tablet (50 mg total) by mouth Two (2) times a day.     metoprolol tartrate 25 MG tablet  Commonly known as:  LOPRESSOR  Take 0.5 tablets (12.5 mg total) by mouth Two (2) times a day.     mycophenolate 180 MG EC tablet  Commonly known as:  MYFORTIC  Take 2 tablets (360 mg total) by mouth Two (2) times a day.     PROGRAF 0.5 MG capsule  Generic drug:  tacrolimus  TAKE 2 CAPSULES BY MOUTH IN THE MORNING AND 2 CAPSULES IN THE EVENING (dose=1mg )     ranitidine 150 MG tablet  Commonly known as:  ZANTAC  Take 1 tablet (150 mg total) by mouth two (2) times a day as needed for heartburn.

## 2018-04-18 NOTE — Unmapped (Addendum)
1.  Stop taking metoprolol.  2. Increase amlodipine to 10mg  (take two 5mg  pills together). I will send in a new prescription for 10mg  pills. When you get that new prescription, you can take one pill at a time.    Measure blood pressure over the next 2 weeks and call my assistant with the numbers:    Blood Pressure  Measure your blood pressure at home and send me the results.  Pick 3 days in a week.  Choose 2 times on each of those days.  At those times, sit down and relax for a few minutes with feet flat on the floor.  Then measure your blood pressure 3 times at each sitting.    Date/Time Blood Pressure Pulse  Notes    1.    2.    3.  --------------------------------------------------------------------------------------    1.    2.    3.  --------------------------------------------------------------------------------------    1.    2.    3.  -------------------------------------------------------------------------------------    1.    2.    3.  To Contact Dr. Irena Cords:    MyChart is a great way to reach me.    My assistant is Jannett Celestine. Her phone number is [phone].  She knows where to find me and will help you to get in touch with me directly.    The clinic number is: [phone] or toll free at [phone].  After hours, these numbers go to a nurse call system called Healthlink. Healthlink can reach out to the covering physician if needed.    If you need to be seen right away, our clinic can get you in the same day to see me or one of my partners.  Please call the clinic and tell them you need to be seen the same day or the day after.

## 2018-04-18 NOTE — Unmapped (Signed)
Addended by: Dolores Hoose on: 04/18/2018 04:19 PM     Modules accepted: Orders

## 2018-04-19 LAB — CMV DNA, QUANTITATIVE, PCR

## 2018-04-19 LAB — EBV VIRAL LOAD RESULT: Lab: NOT DETECTED

## 2018-04-19 LAB — CMV QUANT: Lab: 99 — ABNORMAL HIGH

## 2018-05-03 NOTE — Unmapped (Signed)
Sophia Davis called in to report her blood pressures have been very good since her medication change.    Saturday 9am 133/74     123/68    6pm 119/67     101/63  Sunday:   9am  123/67     121/58    6pm 132/71     125/53  Monday: 9am 114/62     123/70    8pm 133/67     114/58  Tuesday: 9am 128/64     118/67    7pm 134/67     11 3/66

## 2018-05-10 NOTE — Unmapped (Signed)
[facility] Refill Coordination Note    Specialty Medication(s) to be Shipped:   Transplant: Myfortic 180mg  and Prograf 0.5mg   Other medication(s) to be shipped: Alenderonate 70mg , Chlorthalidone 25mg ,  Vitamin D, Atorvastatin 40mg , Amlodipine 5mg  & Losartan 50mg      [name], DOB: [birth year]  Phone: [phone] (home)   Shipping Address: [address]  [place] [address]    All above HIPAA information was verified with patient.     Completed refill call assessment today to schedule patient's medication shipment from the [facility] [phone]).       Specialty medication(s) and dose(s) confirmed: Regimen is correct and unchanged.   Changes to medications: Kharisma reports no changes reported at this time.  Changes to insurance: No  Questions for the pharmacist: No    The patient will receive an FSI print out for each medication shipped and additional FDA Medication Guides as required.  Patient education from Oliver or [name] may also be included in the shipment.    DISEASE/MEDICATION-SPECIFIC INFORMATION        N/A    ADHERENCE     Medication Adherence    Patient reported X missed doses in the last month:  0         MEDICARE PART B DOCUMENTATION     Myfortic 180mg : Patient has 10 days worth of tablets on hand.  Prograf 0.5mg : Patient has 10 days worth of capsules on hand.    SHIPPING     Shipping address confirmed in FSI.     Delivery Scheduled: Yes, Expected medication delivery date: 05/18/2018 via UPS or courier.  Same day courier service via [employer].    Demari Kropp Leodis Binet   [facility] Specialty Technician

## 2018-05-18 MED FILL — VITAMIN D-3/1000 U/: VITAMIN D-3/1000 U/ | 90 days supply | Qty: 90 | Fill #1

## 2018-05-18 MED FILL — ATORVASTATIN/40MG/TABS: ATORVASTATIN/40MG/TABS | 90 days supply | Qty: 90 | Fill #2

## 2018-05-18 MED FILL — CHLORTHALIDONE/25MG/TABS: CHLORTHALIDONE/25MG/TABS | 30 days supply | Qty: 30 | Fill #10

## 2018-05-18 MED FILL — AMLODIPINE/5MG/TABS: AMLODIPINE/5MG/TABS | 90 days supply | Qty: 90 | Fill #2

## 2018-05-18 MED FILL — ALENDRONATE SODIUM/70MG/TABS: ALENDRONATE SODIUM/70MG/TABS | 28 days supply | Qty: 1 | Fill #7

## 2018-05-18 MED FILL — LOSARTAN/50MG/TABS: LOSARTAN/50MG/TABS | 90 days supply | Qty: 180 | Fill #2

## 2018-05-18 MED FILL — PROGRAF/0.5MG/CAP: PROGRAF/0.5MG/CAP | 30 days supply | Qty: 120 | Fill #7

## 2018-05-18 MED FILL — MYFORTIC/180MG/TAB: MYFORTIC/180MG/TAB | 30 days supply | Qty: 120 | Fill #6

## 2018-05-23 NOTE — Unmapped (Signed)
Current Outpatient Medications on File Prior to Visit   Medication Sig   ??? albuterol (PROVENTIL HFA;VENTOLIN HFA) 90 mcg/actuation inhaler Inhale 2 puffs every six (6) hours as needed for wheezing or shortness of breath (Cough).   ??? alendronate (FOSAMAX) 70 MG tablet TAKE 1 TABLET (70 MG) BY MOUTH EVERY 7 DAYS WITH A FULL GLASS OF WATER. DO NOT LIE DOWN FOR 30 MINUTES.   ??? amLODIPine (NORVASC) 10 MG tablet Take 0.5 tablets (5 mg total) by mouth every evening.   ??? aspirin 81 MG chewable tablet Chew 81 mg daily.   ??? atorvastatin (LIPITOR) 40 MG tablet Take 1 tablet (40 mg total) by mouth every evening.   ??? chlorthalidone (HYGROTON) 25 MG tablet Take 1 tablet (25 mg total) by mouth every morning.   ??? cholecalciferol, vitamin D3, 1,000 unit capsule Take 1 capsule (1,000 Units total) by mouth daily.   ??? losartan (COZAAR) 50 MG tablet Take 1 tablet (50 mg total) by mouth Two (2) times a day.   ??? metoprolol tartrate (LOPRESSOR) 25 MG tablet Take 0.5 tablets (12.5 mg total) by mouth Two (2) times a day.   ??? mycophenolate (MYFORTIC) 180 MG EC tablet Take 2 tablets (360 mg total) by mouth Two (2) times a day.   ??? PROGRAF 0.5 mg capsule TAKE 2 CAPSULES BY MOUTH IN THE MORNING AND 2 CAPSULES IN THE EVENING (dose=1mg )   ??? ranitidine (ZANTAC) 150 MG tablet Take 1 tablet (150 mg total) by mouth two (2) times a day as needed for heartburn.     No current facility-administered medications on file prior to visit.

## 2018-05-30 NOTE — Unmapped (Signed)
Epic [facility]) medication reconciliation is completed.

## 2018-06-22 NOTE — Unmapped (Signed)
[facility] Refill and Clinical Coordination Note  Medication(s): prograf, myfortic    Sophia Davis, DOB: 26-May-1946  Phone: [phone] (home) , Alternate phone contact: N/A  Shipping address: [address]  [place] [address]  Phone or address changes today?: No  All above HIPAA information verified.  Insurance changes? No    Completed refill and clinical call assessment today to schedule patient's medication shipment from the [facility] [phone]).      MEDICATION RECONCILIATION    Confirmed the medication and dosage are correct and have not changed: Yes, regimen is correct and unchanged.    Were there any changes to your medication(s) in the past month:  No, there are no changes reported at this time.    ADHERENCE    Is this medicine transplant or covered by Medicare Part B? Yes.    Myfortic 180 mg   Quantity filled last month: 120   # of tablets left on hand: 8 days left  Prograf 0.5 mg   Quantity filled last month: 120   # of tablets left on hand: 8 days left    Did you miss any doses in the past 4 weeks? No missed doses reported.  Adherence counseling provided? Not needed     SIDE EFFECT MANAGEMENT    Are you tolerating your medication?:  Sophia Davis reports tolerating the medication.  Side effect management discussed: None      Therapy is appropriate and should be continued.    Evidence of clinical benefit: See Epic note from 01/25/18      FINANCIAL/SHIPPING    Delivery Scheduled: Yes, Expected medication delivery date: 06/27/18 via ups     Additional medications refilled: chlorthalidone, alendronate - 4rx total ( requested new prograf rx today for refills)    The patient will receive a drug information handout for each medication shipped and additional FDA Medication Guides as required.      [name] did not have any additional questions at this time.    Delivery address confirmed in Epic.     We will follow up with patient monthly for standard refill processing and delivery.      Thank you,  Thad Ranger   [facility] Shared [facility] Specialty Pharmacist

## 2018-06-24 MED ORDER — TACROLIMUS 0.5 MG CAPSULE
ORAL_CAPSULE | Freq: Two times a day (BID) | ORAL | 11 refills | 30.00000 days | Status: CP
Start: 2018-06-24 — End: ?
  Filled 2018-06-24: qty 120, 30d supply, fill #0

## 2018-06-24 MED FILL — CHLORTHALIDONE 25 MG TABLET: ORAL | 30 days supply | Qty: 30 | Fill #0

## 2018-06-24 MED FILL — ALENDRONATE 70 MG TABLET: 28 days supply | Qty: 4 | Fill #0 | Status: AC

## 2018-06-24 MED FILL — MYFORTIC 180 MG TABLET,DELAYED RELEASE: 30 days supply | Qty: 120 | Fill #0 | Status: AC

## 2018-06-24 MED FILL — CHLORTHALIDONE 25 MG TABLET: 30 days supply | Qty: 30 | Fill #0 | Status: AC

## 2018-06-24 MED FILL — PROGRAF 0.5 MG CAPSULE: 30 days supply | Qty: 120 | Fill #0 | Status: AC

## 2018-06-24 MED FILL — MYFORTIC 180 MG TABLET,DELAYED RELEASE: 30 days supply | Qty: 120 | Fill #0

## 2018-06-24 MED FILL — ALENDRONATE 70 MG TABLET: 28 days supply | Qty: 4 | Fill #0

## 2018-06-28 ENCOUNTER — Ambulatory Visit: Admit: 2018-06-28 | Discharge: 2018-06-28 | Payer: MEDICARE | Attending: Registered" | Primary: Registered"

## 2018-06-28 ENCOUNTER — Ambulatory Visit: Admit: 2018-06-28 | Discharge: 2018-06-28 | Payer: MEDICARE

## 2018-06-28 DIAGNOSIS — Z0000 Encounter for general adult medical examination without abnormal findings: Principal | ICD-10-CM

## 2018-06-28 DIAGNOSIS — Z1239 Encounter for other screening for malignant neoplasm of breast: Principal | ICD-10-CM

## 2018-06-28 NOTE — Unmapped (Signed)
I was the supervising physician in the delivery of the service. Abdulahad Mederos E Kimel-Scott, MD

## 2018-06-28 NOTE — Unmapped (Signed)
MEDICARE SCREENING & PREVENTION GUIDELINES RECOMMENDATIONS NEXT DATE DUE    DEXA Bone Density Measurement  (women)   One time screen for age 73 or older. Up to date   Mammogram (women) Age 34 to 67 every 2 years or as recommended by most recent mammogram result. Up to date   Diabetes Screening Screening: fasting glucose or hemoglobin A1c every 3 years Up to date   Heart Disease (Total cholesterol, LDL, and HDL)   Every 5 years until age 64 if no apparent signs or symptoms of heart disease. No need to screen if already taking a statin. Up to date         Hepatitis C Screening Screen once if date of birth is between 1946-1965 Up to date   Colorectal Cancer Age 28 to 64 stool cards yearly OR colonoscopy every 10 years (or more often if high risk per last result). Up to date   Alcohol Use Screening Screening yearly Up to date   Tobacco Use Screening Screen at every visit Up to date     Depression Screening Screening yearly Up to date   Influenza Vaccine Yearly Up to date   Pneumococcal Vaccines    Pneumovax 23, PPSV23    Prevnar 13, PCV13 Adults age 87 or older (immunocompetent): PCV13 followed by PPSV23 at least 1 year later. If PPSV23 given first, give PCV13 at least 1 year later. Up to date     Shingles Vaccine Shingrix recommended age 66 years and older: 2 doses 2 to 6 months apart.     Covered by Medicare Part D Up to date   Tetanus Vaccines One dose of   Tdap in lifetime. Td   booster every   10 years (Medicare Part B does not cover for prevention).  Ask for this at your pharmacy

## 2018-06-28 NOTE — Unmapped (Signed)
Addended by: Valentino Saxon on: 06/28/2018 09:45 AM     Modules accepted: Orders

## 2018-06-28 NOTE — Unmapped (Signed)
Annual Wellness Visit:    ASSESSMENT/PLAN TO ADDRESS SCREENING, RISK FACTORS, AND PERSONAL PREVENTION PLAN:    Follow-up for PCP (patient preferred to discuss with PCP first):  -- Vaccinations: Tdap    Orders Placed today:  No orders of the defined types were placed in this encounter.      Personalized Prevention Plan: A personalized prevention plan was reviewed with the patient, appropriate referrals were made, and the patient was given a copy for their personal records as a part of the After Visit Summary available in Epic.       HEALTH RISK SCREENING, ASSESSMENT/PLAN:  1. Tobacco Use Screen: No concerns identified based on screening    2. Unhealthy Alcohol Use Screen:   Do you sometimes drink beer, wine, or other alcoholic beverages? (If No or Patient Declines, choose ???Not Applicable??? for next 2 questions)  No    How many times in the past year have you had 4 or more drinks in a day?   Not Applicable    If 1 or more, did you give the patient the AUDIT?  Not Applicable    No concerns identified based on screening    3. Advanced Care Planning: Patient has an advanced directive    4. General Health: Fair-Good   Mouth and teeth condition: Good     5. Functional Ability/Safety Screen:      > Home Safety Evaluation: No concerns identified based on screening        > Functional Evaluation and ADL Assessment:: No concerns identified based on screening questionnaire. Patient able to adequately perform ADLs       >During the last 3 months have you leaked urine (even a small amount)????: No        Incontinence: never        > Physical Activity in the past 7 days: 3 days   Duration: 30 minutes per day   Intensity: Light   Plan: Pt advised to exercise 30 minutes most days of the week                   > Fall risk:   Has the patient fallen within the past year? no  Does patient feel unsteady when standing or walking?  no   No concerns identified based on screening and Get Up And Go score of <12 seconds                > Hearing Assessment: Hearing HHIE-S score:  0; No concerns identified based on screening, HHIE-S score 0 to 8             6. Cognition screen: 6-Item Cognitive Screener: Have patient repeat the words APPLE, TABLE, PENNY then ask the following.  What year is this?  What month is this?  What is the day of the week?  What are the three items I asked you to remember?  Result: Score:5; No concerns identified based on screening     7. Nutrition: No concerns identified based on screening       8. HIV Risk Screen: not applicable    9. Hepatitis C Risk Screen: Was the patient born between the years 1945-1965?  yes up-to-date on screening    10. Depression screen: PHQ-9 score: 1; Minimal (score 1-4), No concerns identified based on screening       11. The 10-yr ASCVD Risk score Denman George DC Jr., et al., 2013) failed to calculate due to the following reason:  The 2013 10-yr  ASCVD risk score is only valid if the patient does not have prior/existing clinical ASCVD (myocardial infarction, stroke, CABG, coronary angioplasty, angina or peripheral arterial disease, coronary atherosclerosis, ischemic heart disease, or cerebrovascular disease). The patient has prior/existing ASCVD.   Had Coronary Angioplasty      12. Chronic Care Management Services:    Patient was informed of services. Explained the right to revoke/only one provider/given care plan. Patient signed consent: No      REASON FOR VISIT: Annual Wellness Visit    LIST OF CURRENT PROVIDERS:  Patient Care Team:  Edmonia James, MD as PCP - General (Internal Medicine)  Darren Dudley Major, MD as PCP - General-ATTRIBUTED  Alla Feeling, RN as Transplant Coordinator  Griffin Dakin, MD as Transplant Nephrologist (Nephrology)      ALLERGIES: Reviewed and Updated in EPIC: No Known Allergies  MEDICATIONS: Reviewed and Updated in EPIC      PAST MEDICAL / SURGICAL HISTORY:  Active Ambulatory Problems     Diagnosis Date Noted   ??? Granulomatosis with Polyangiitis 12/17/2005   ??? Herpes zoster 07/03/2011   ??? Hypercholesterolemia 09/28/2003   ??? Essential hypertension (RAF-HCC) 09/28/2003   ??? Arteritis (CMS-HCC) 01/27/2012   ??? Status post total abdominal hysterectomy 03/23/2013   ??? Osteoporosis 03/23/2013   ??? Insomnia 03/23/2013   ??? Stented coronary artery 03/23/2013   ??? Secondary hyperparathyroidism (CMS-HCC) 03/23/2013   ??? Kidney transplant 05/17/2013 08/15/2013   ??? Immunosuppression (CMS-HCC) 07/22/2015   ??? Cervical cancer (CMS-HCC) 10/26/2016     Resolved Ambulatory Problems     Diagnosis Date Noted   ??? Chronic kidney disease, stage IV (severe) (CMS-HCC) 05/27/2011   ??? 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 Coronary artery disease 03/23/2013   ??? Acquired trigger finger 03/06/2015   ??? Influenza A 11/25/2016     Past Medical History:   Diagnosis Date   ??? Hypertension, benign 09/28/2003         FAMILY HISTORY (Pertinent):    Family History   Problem Relation Age of Onset   ??? Stroke Mother    ??? Heart disease Father    ??? Heart disease Sister    ??? Breast cancer Neg Hx    ??? Colon cancer Neg Hx    ??? Ovarian cancer Neg Hx    ??? Endometrial cancer Neg Hx           SOCIAL HISTORY / SCREENINGS:  see screening and A/P above      PHYSICAL EXAM:  Vital Signs: BP 122/59  - Pulse 65  - Ht 154.9 cm (5' 0.98)  - Wt 58.3 kg (128 lb 9.6 oz)  - BMI 24.31 kg/m??   Musculoskeletal: see above screening A/P  Psychiatric: see above screening A/P

## 2018-07-19 NOTE — Unmapped (Signed)
[facility] Refill Coordination Note    Specialty Medication(s) to be Shipped:   Transplant: Myfortic 180mg  and tacrolimus 0.5mg   Other medication(s) to be shipped: Amlodipine 10mg , Alendronate 70mg  & Chlorthalidone 25mg   **Faxed md for refill on Chlorthalidone 25mg  **     Sophia Davis, DOB: 12-05-1945  Phone: [phone] (home)   Shipping Address: [address]  [place] [address]    All above HIPAA information was verified with patient.     Completed refill call assessment today to schedule patient's medication shipment from the [facility] [phone]).       Specialty medication(s) and dose(s) confirmed: Regimen is correct and unchanged.   Changes to medications: Donette reports no changes reported at this time.  Changes to insurance: No  Questions for the pharmacist: No    The patient will receive a drug information handout for each medication shipped and additional FDA Medication Guides as required.      DISEASE/MEDICATION-SPECIFIC INFORMATION        N/A    ADHERENCE     Medication Adherence    Patient reported X missed doses in the last month:  0  Informant:  patient  Adherence tools used:  patient uses a pill box to manage medications          MEDICARE PART B DOCUMENTATION     Myfortic 180mg : Patient has 10 days worth of tablets on hand.  Tacrolimus 1mg : Patient has 10 days worth of capsules on hand.    SHIPPING     Shipping address confirmed in Epic.     Delivery Scheduled: Yes, Expected medication delivery date: 07/22/2018 via UPS or courier.     Canaan Holzer Leodis Binet   [facility] Shared [facility] Specialty Technician

## 2018-07-20 MED FILL — MYFORTIC 180 MG TABLET,DELAYED RELEASE: 30 days supply | Qty: 120 | Fill #1

## 2018-07-20 MED FILL — MYFORTIC 180 MG TABLET,DELAYED RELEASE: 30 days supply | Qty: 120 | Fill #1 | Status: AC

## 2018-07-20 MED FILL — ALENDRONATE 70 MG TABLET: 28 days supply | Qty: 4 | Fill #1

## 2018-07-20 MED FILL — PROGRAF 0.5 MG CAPSULE: 30 days supply | Qty: 120 | Fill #1 | Status: AC

## 2018-07-20 MED FILL — AMLODIPINE 10 MG TABLET: 90 days supply | Qty: 45 | Fill #0

## 2018-07-20 MED FILL — PROGRAF 0.5 MG CAPSULE: ORAL | 30 days supply | Qty: 120 | Fill #1

## 2018-07-20 MED FILL — ALENDRONATE 70 MG TABLET: 28 days supply | Qty: 4 | Fill #1 | Status: AC

## 2018-07-20 MED FILL — AMLODIPINE 10 MG TABLET: 90 days supply | Qty: 45 | Fill #0 | Status: AC

## 2018-07-27 MED ORDER — CHLORTHALIDONE 25 MG TABLET
Freq: Every morning | ORAL | 3 refills | 0 days | Status: CP
Start: 2018-07-27 — End: 2019-05-02
  Filled 2018-08-01: qty 90, 90d supply, fill #0

## 2018-08-01 MED FILL — CHLORTHALIDONE 25 MG TABLET: 90 days supply | Qty: 90 | Fill #0 | Status: AC

## 2018-08-10 ENCOUNTER — Ambulatory Visit: Admit: 2018-08-10 | Discharge: 2018-08-10 | Payer: MEDICARE | Attending: Nephrology | Primary: Nephrology

## 2018-08-10 ENCOUNTER — Ambulatory Visit: Admit: 2018-08-10 | Discharge: 2018-08-10 | Payer: MEDICARE

## 2018-08-10 DIAGNOSIS — Z79899 Other long term (current) drug therapy: Secondary | ICD-10-CM

## 2018-08-10 DIAGNOSIS — Z940 Kidney transplant status: Principal | ICD-10-CM

## 2018-08-10 DIAGNOSIS — D899 Disorder involving the immune mechanism, unspecified: Secondary | ICD-10-CM

## 2018-08-10 LAB — CBC W/ AUTO DIFF
BASOPHILS ABSOLUTE COUNT: 0.1 10*9/L (ref 0.0–0.1)
BASOPHILS RELATIVE PERCENT: 2.1 %
EOSINOPHILS ABSOLUTE COUNT: 0.2 10*9/L (ref 0.0–0.4)
EOSINOPHILS RELATIVE PERCENT: 2.7 %
HEMATOCRIT: 37.5 % (ref 36.0–46.0)
HEMOGLOBIN: 12.1 g/dL (ref 12.0–16.0)
LARGE UNSTAINED CELLS: 3 % (ref 0–4)
LYMPHOCYTES RELATIVE PERCENT: 17 %
MEAN CORPUSCULAR HEMOGLOBIN CONC: 32.3 g/dL (ref 31.0–37.0)
MEAN CORPUSCULAR HEMOGLOBIN: 27.5 pg (ref 26.0–34.0)
MEAN CORPUSCULAR VOLUME: 85.2 fL (ref 80.0–100.0)
MEAN PLATELET VOLUME: 7.9 fL (ref 7.0–10.0)
MONOCYTES ABSOLUTE COUNT: 0.5 10*9/L (ref 0.2–0.8)
MONOCYTES RELATIVE PERCENT: 7.5 %
NEUTROPHILS RELATIVE PERCENT: 67.6 %
PLATELET COUNT: 274 10*9/L (ref 150–440)
RED BLOOD CELL COUNT: 4.41 10*12/L (ref 4.00–5.20)
RED CELL DISTRIBUTION WIDTH: 14.1 % (ref 12.0–15.0)
WBC ADJUSTED: 6 10*9/L (ref 4.5–11.0)

## 2018-08-10 LAB — URINALYSIS
BILIRUBIN UA: NEGATIVE
GLUCOSE UA: NEGATIVE
KETONES UA: NEGATIVE
NITRITE UA: NEGATIVE
PH UA: 6.5 (ref 5.0–9.0)
PROTEIN UA: NEGATIVE
RBC UA: <1 /HPF (ref <=4)
SQUAMOUS EPITHELIAL: 1 /HPF (ref 0–5)
UROBILINOGEN UA: 0.2
WBC UA: 3 /HPF (ref 0–5)

## 2018-08-10 LAB — COMPREHENSIVE METABOLIC PANEL
ALBUMIN: 4.1 g/dL (ref 3.5–5.0)
ALKALINE PHOSPHATASE: 66 U/L (ref 38–126)
ALT (SGPT): 27 U/L (ref 15–48)
ANION GAP: 11 mmol/L (ref 7–15)
AST (SGOT): 21 U/L (ref 14–38)
BILIRUBIN TOTAL: 0.8 mg/dL (ref 0.0–1.2)
BLOOD UREA NITROGEN: 18 mg/dL (ref 7–21)
BUN / CREAT RATIO: 15
CALCIUM: 8.8 mg/dL (ref 8.5–10.2)
CO2: 30 mmol/L (ref 22.0–30.0)
CREATININE: 1.24 mg/dL — ABNORMAL HIGH (ref 0.60–1.00)
EGFR CKD-EPI AA FEMALE: 50 mL/min/{1.73_m2} — ABNORMAL LOW (ref >=60)
EGFR CKD-EPI NON-AA FEMALE: 43 mL/min/{1.73_m2} — ABNORMAL LOW (ref >=60)
GLUCOSE RANDOM: 104 mg/dL — ABNORMAL HIGH (ref 65–99)
POTASSIUM: 3.1 mmol/L — ABNORMAL LOW (ref 3.5–5.0)
PROTEIN TOTAL: 6.5 g/dL (ref 6.5–8.3)
SODIUM: 137 mmol/L (ref 135–145)

## 2018-08-10 LAB — PHOSPHORUS: Phosphate:MCnc:Pt:Ser/Plas:Qn:: 3.3

## 2018-08-10 LAB — MAGNESIUM: Magnesium:MCnc:Pt:Ser/Plas:Qn:: 1.4 — ABNORMAL LOW

## 2018-08-10 LAB — UROBILINOGEN UA: Lab: 0.2

## 2018-08-10 LAB — NEUTROPHILS ABSOLUTE COUNT: Lab: 4

## 2018-08-10 LAB — SODIUM: Sodium:SCnc:Pt:Ser/Plas:Qn:: 137

## 2018-08-10 LAB — PROTEIN URINE: Protein:MCnc:Pt:Urine:Qn:: 5.3

## 2018-08-10 LAB — PROTEIN / CREATININE RATIO, URINE: PROTEIN URINE: 5.3 mg/dL

## 2018-08-10 LAB — TACROLIMUS, TROUGH: Lab: 6.5

## 2018-08-10 NOTE — Unmapped (Signed)
A urine specimen was collected at the visit.

## 2018-08-11 LAB — CMV COMMENT: Lab: 0

## 2018-08-11 LAB — CMV DNA, QUANTITATIVE, PCR
CMV QUANT LOG10: 1.88 {Log_IU}/mL — ABNORMAL HIGH (ref <0.00)
CMV QUANT: 76 [IU]/mL — ABNORMAL HIGH (ref <0)

## 2018-08-12 NOTE — Unmapped (Signed)
[facility] Refill Coordination Note  No refills needed at this time     Sophia Davis, DOB: 1946-06-18  Phone: [phone] (home) , Alternate phone contact: N/A  Phone or address changes today?: No  All above HIPAA information was verified with patient.  Shipping Address: [address]  [place] [address]   Insurance changes? No    Completed refill call assessment today to schedule patient's medication shipment from the [facility] [phone]).      [name] reported having a three week supply on hand.  Rescheduling refill call for next week. She is aware that she can call us at anytime if she feels she is running low before that next refill call.    Thank you,  Breck Coons Shared [facility] Specialty Pharmacist

## 2018-08-22 MED FILL — PROGRAF 0.5 MG CAPSULE: ORAL | 30 days supply | Qty: 120 | Fill #2

## 2018-08-22 MED FILL — ATORVASTATIN 40 MG TABLET: 90 days supply | Qty: 90 | Fill #0

## 2018-08-22 MED FILL — VITAMIN D3 25 MCG (1,000 UNIT) TABLET: ORAL | 90 days supply | Qty: 90 | Fill #0

## 2018-08-22 MED FILL — MYFORTIC 180 MG TABLET,DELAYED RELEASE: 30 days supply | Qty: 120 | Fill #2

## 2018-08-22 MED FILL — ALENDRONATE 70 MG TABLET: 28 days supply | Qty: 4 | Fill #2

## 2018-08-22 MED FILL — MYFORTIC 180 MG TABLET,DELAYED RELEASE: 30 days supply | Qty: 120 | Fill #2 | Status: AC

## 2018-08-22 MED FILL — PROGRAF 0.5 MG CAPSULE: 30 days supply | Qty: 120 | Fill #2 | Status: AC

## 2018-08-22 MED FILL — ALENDRONATE 70 MG TABLET: 28 days supply | Qty: 4 | Fill #2 | Status: AC

## 2018-08-22 MED FILL — ATORVASTATIN 40 MG TABLET: 90 days supply | Qty: 90 | Fill #0 | Status: AC

## 2018-08-22 MED FILL — VITAMIN D3 25 MCG (1,000 UNIT) TABLET: 90 days supply | Qty: 90 | Fill #0 | Status: AC

## 2018-08-22 NOTE — Unmapped (Signed)
[facility] Pharm[facility] Refill Coordination Note    Specialty Medication(s) to be Shipped:   Transplant: Myfortic 180mg  and Prograf 0.5mg     Other medication(s) to be shipped:   alendronate (FOSAMAX) 70 MG  cholecalciferol, vitamin D3, 1,000 unit (25 mcg)  atorvastatin (LIPITOR) 40 MG       Sophia Davis, DOB: 02/14/1946  Phone: [phone] (home)       All above HIPAA information was verified with patient.     Completed refill call assessment today to schedule patient's medication shipment from the [facility] [phone]).       Specialty medication(s) and dose(s) confirmed: Regimen is correct and unchanged.   Changes to medications: Hiliana reports no changes reported at this time.  Changes to insurance: No  Questions for the pharmacist: No    The patient will receive a drug information handout for each medication shipped and additional FDA Medication Guides as required.      DISEASE/MEDICATION-SPECIFIC INFORMATION        N/A    ADHERENCE     Medication Adherence    Patient reported X missed doses in the last month:  0      Adherence tools used:  patient uses a pill box to manage medications                      MEDICARE PART B DOCUMENTATION     Myfortic 180mg : Patient has 3 days worth of tablets on hand.  Prograf 0.5mg : Patient has 3 days worth of capsules on hand.    SHIPPING     Shipping address confirmed in Epic.     Delivery Scheduled: Yes, Expected medication delivery date: 08/23/18 via UPS or courier.     Medication will be delivered via Next Day Courier to the home address in Epic WAM.    [place] A Dylin Breeden   [facility] Shared [facility] Specialty Technician

## 2018-08-27 NOTE — Unmapped (Signed)
??  Dermatology Clinic Outpatient     ??  Assessment and Plan:   ??  Benign lesions (seborrheic keratoses, nevi and solar lentigenes) in a patient with a history of immunosuppression secondary to kidney transplant  - Reassurance provided regarding the benign appearance of lesions noted on exam today, and that no treatment is indicated in the absence of symptoms/changes.  - ABCDEs of pigmented lesions were discussed.  - A patient education handout was provided.  - Encouraged regular self-skin monitoring as well as annual clinical examinations for surveillance.  - Reinforced importance of photoprotective strategies including sunscreen use, use of protective clothing, and sun avoidance for prevention of cutaneous malignancy and photoaging.    Actinic Keratoses, 1 treated  - The risks, benefits, indications, alternatives, and complications of the  procedure were discussed, and informed consent obtained. The lesion(s) were  treated with cryotherapy. The patient tolerated the procedure well without  complications. Wound care instructions were provided. (see procedure note  for more details)  - Location: nose  Procedure note:  After discussion of risks, benefits, alternatives, including but not limited to scar, post-inflammatory pigment changes, infection, blister, pain, recurrence, and necessity of further treatment, patient agreed to treatment and verbal consent was obtained. Marland KitchenApplied liquid nitrogen x 5-10 seconds for 1  freeze-thaw cycle to 1 lesions.  The  patient tolerated the procedure well and was instructed on post-procedure care.    Seborrheic keratosis, inflamed  - Liquid Nitrogen (LN2) X 1 cycle X 10 seconds each after risks/benefits & wound  care discussed (please see procedure note for further details)  - Reassurance, benign, monitor  - Explained that more lesions may appear overtime  - ABCDE's discussed  Procedure note:  After discussion of risks, benefits, alternatives, including but not limited to scar, post-inflammatory pigment changes, infection, blister, pain, recurrence, and necessity of further treatment, patient agreed to treatment and verbal consent was obtained. Applied liquid nitrogen x 5-10 seconds to 1 lesions.  The  patient tolerated the procedure well and was instructed on post-procedure care.    Education was provided by discussing the etiology, natural history, course and treatment for the above conditions.  Reassurance and anticipatory guidance were provided    The treatment plan was discussed with the patient, who voiced understanding, and all questions were answered.    Return to clinic: Return in about 1 year (around 08/30/2019) for Skin Check.    ??  Subjective:   ??  Referring physician: Griffin Dakin, MD  [address]  [id number]; [address]  [place], [place] [address]    Chief Complaint:   Chief Complaint   Patient presents with   ??? Skin Cancer     ptient was referred here for FBSE because she is immunosuppressed       HPI: This is a pleasant 72 y.o. female who is seen today in consultation at the request of Dr. Toni Arthurs by Amada Kingfisher, MD for evaluation of a mole check. She states that in 2016, she had an SK on the left cheek treated with cryotherapy. This mostly went away but a small brown bump has returned. Patient denies that this is growing rapidly, tender, itching, or bleeding.     She denies any other new, changing, bleeding or worrisome skin lesions. She has no other skin concerns.    Pertinent Past Medical History:   No history of skin cancer  CKD stage IV on immunosuppression s/p kidney transplant, on Cellcept and Prograf  Granulomatosis  with polyangiitis      Family  History:   No family history of melanoma or other skin cancer    Social History:   Reviewed in Epic    Review of Systems:  Baseline state of health. No recent illnesses, denies fevers, chills, loss of apetite or weight changes. No other skin complaints. Other than the HPI, the balance of 10 system reviewed is negative.    Objective:   ??  Physical Examination:  General: Well-developed, well-nourished female in no acute distress, resting comfortably.  Neuro: A&Ox 3. Answers questions appropriately.  Psych: Mood and affect appropriate for age  Skin: Examination including inspection and palpation of the face, neck, chest, abdomen, back, bilateral upper extremities, bilateral lower extremities, palms, and nails was performed and notable for the following:  - Numerous hyperkeratotic, well-demarcated, stuck-on pigmented to skin-colored 6-20 mm papules and plaques scattered predominantly over the trunk and extremities, some of which are excoriated with surrounding erythema, including on the left cheek  - One gritty erythematous macule on the nose  - Face, neck, chest, abdomen, back, extremities with a number of brown 1-4 mm diameter macules with regular border and uniform coloration, dermoscopy reveals regular pigment network   - 6-20 mm pigmented macules that are tan to brown in color and are somewhat non-uniform in shape and concentrated in the sun-exposed areas of the trunk and extremities   - All other areas not specifically commented on are within normal limits.

## 2018-08-29 ENCOUNTER — Ambulatory Visit: Admit: 2018-08-29 | Discharge: 2018-08-30 | Payer: MEDICARE | Attending: Dermatology | Primary: Dermatology

## 2018-08-29 DIAGNOSIS — L820 Inflamed seborrheic keratosis: Secondary | ICD-10-CM

## 2018-08-29 DIAGNOSIS — L821 Other seborrheic keratosis: Secondary | ICD-10-CM

## 2018-08-29 DIAGNOSIS — D229 Melanocytic nevi, unspecified: Secondary | ICD-10-CM

## 2018-08-29 DIAGNOSIS — L814 Other melanin hyperpigmentation: Secondary | ICD-10-CM

## 2018-08-29 DIAGNOSIS — L570 Actinic keratosis: Principal | ICD-10-CM

## 2018-08-29 NOTE — Unmapped (Signed)
Seborrheic keratosis (seb-o-REE-ik care-uh-TOE-sis) is a common skin growth. It may seem worrisome because it can look like a wart, pre-cancerous skin growth (actinic keratosis), or skin cancer. Despite their appearance, seborrheic keratoses are harmless.  Most people get these growths when they are middle aged or older. Because they begin at a later age and can have a wart-like appearance, seborrheic keratoses are often called the ???barnacles of aging.???  It???s possible to have just one of these growths, but most people develop several. Some growths may have a warty surface while others look like dabs of warm, brown candle wax on the skin.  Seborrheic keratoses range in color from white to black; however, most are tan or brown.  You can find these harmless growths anywhere on the skin, except the palms and soles. Most often, you???ll see them on the chest, back, head, or neck.  Seborrheic keratoses are not contagious.    SIGNS AND SYMPTOMS  Seborrheic keratoses tend to:  ? Start as small, rough bumps, then slowly thicken and develop a warty surface.  ? Have a waxy, stuck-on-the-skin look.  ? Be brown, though they range in color from white to black.  ? Range in size from a fraction of an inch to larger than a half-dollar.  ? Form on the chest, back, stomach, scalp, face, neck, or other parts of the body (but not on the palms and soles).  ? Cause no pain ??? some itch.    Who gets seborrheic keratoses?  The people most likely to develop these harmless growths have fair skin and family members who have seborrheic keratoses. These growths also develop in people with medium to dark skin. In dark skin, seborrheic keratoses tend to be small and appear around the eyes.  Some people develop seborrheic keratoses during pregnancy or after estrogen replacement therapy.  Most people develop seborrheic keratoses in middle age or later. The number of seborrheic keratoses tends to increase with age.  Children rarely have seborrheic keratoses.    What causes seborrheic keratoses?  The cause of seborrheic keratoses is unknown. We do know the following:  ? Seborrheic keratoses seem to run in families. Some people seem to inherit a tendency to get many of these growths.  ?   ? The sun may play a role in causing seborrheic keratoses. Studies suggest that these growths develop on skin that's gotten lots of sun. Because these growths also develop on skin that's always covered, more research is needed.  ?   ? Seborrheic keratoses are not contagious. These growths may seem to multiply and spread to other parts of the body. What's really happening is that people get more of these growths as they age.    How do dermatologists diagnose seborrheic keratoses?  In most cases, a dermatologist can tell if your skin growth is a seborrheic keratosis by looking at it. Sometimes, a seborrheic keratosis can look like a skin cancer. If it does, the dermatologist will remove the growth so that it can be looked at under a microscope. This is the only way to tell for sure whether a growth is skin cancer.    How do dermatologists treat seborrheic keratoses?  Because seborrheic keratoses are harmless, they most often do not need treatment. A dermatologist may remove a seborrheic keratosis when it is:  ? Looks like a skin cancer  ? Gets caught on clothing or jewelry  ? Becomes irritated easily  ? Seems unsightly to a patient    If the  growth looks like skin cancer, your dermatologist will likely shave off the growth with a blade or scrape it off. This will allow a specially trained doctor to look for skin cancer cells under a microscope.  Other treatments for seborrheic keratoses include:  ? Cryosurgery: The dermatologist applies liquid nitrogen, a very cold liquid, to the growth with a cotton swab or spray gun. This destroys the growth. The seborrheic keratosis tends to fall off within days. Sometimes a blister forms under the seborrheic keratosis and dries into a scab-like crust. The crust will fall off.    Outcome  After removal of a seborrheic keratosis, the skin may be lighter than the surrounding skin. This usually fades with time. Sometimes it is permanent. Most removed seborrheic keratoses do not return. But a new one may occur elsewhere.    Most seborrheic keratoses do not require treatment. You should see a dermatologist if the growth:  ? Grows quickly, turns black, itches, or bleeds (possible signs of skin cancer).  ? Appears suddenly, along with many other new skin growths (possible sign of cancer inside the body).  ? Differs from what a typical seborrheic keratosis looks like.  ? Looks dry, flat, rough, and scaly (it could be an actinic keratosis, which can progress to a type of skin cancer).  ? Becomes easily irritated, such as from shaving or clothes rubbing against it.  ? Displeases you and you want it removed.    Do not try to remove a seborrheic keratosis yourself. There is a risk of infection.  Skin Care after Treatment of Actinic Keratosis    An actinic keratosis is a small, thickened, scaly growth which develops on the skin. It is the most common skin condition caused by sun damage. It is caused by excessive exposure to ultraviolet (UV) rays from the sun over many years.    Each can range from the size of a pinhead to 1-3 cm across. Their color can be light, dark, pink, red, the same color as your skin, or a combination of these. The top of each one may have a yellow-white, scaly crust. Redness may develop in the surrounding skin.   Actinic keratoses feel rough and dry. They are slightly raised from the surface of the skin. Often it is easier to feel rather than see them. They can also be hard and warty.   Several actinic keratoses may develop at about the same time, often in the same area of skin. Sometimes they can join together and form a large, flat-ish, rough area of skin.  Actinic keratoses are caused by damage to the skin by UV light which is part of sunlight. The skin is normally good at repairing any minor damage. But, over the years some areas of skin are unable to cope with the repeated exposure to sun and an actinic keratosis can form. So, it is not a recent bout of sun-tanning that causes them but repeated minor sun damage to the skin over time.  Three things can happen to each actinic keratosis. This is important when considering treatment:  ? The actinic keratosis may regress. This means it clears away on its own, without treatment.  ? An actinic keratosis may persist. So, it remains, doesn't change but doesn't disappear either.  ? It might progress into a skin cancer    In themselves, actinic keratoses are not cancerous (malignant) and do no harm. However, they can sometimes be unsightly.  In people who have between seven and eight actinic  keratoses on their skin, there is about a 1 in 10 chance that one will turn into a form of skin cancer called squamous cell carcinoma (SCC) over a 10-year period. This is not the most serious form of skin cancer (melanoma). It is a fairly slow-growing cancer and can usually be easily cured if treated early enough. This means that actinic keratosis can be seen as a possible precursor to cancer.  Without treatment, some actinic keratoses will disappear on their own. However, they can come back (recur) or you may develop new ones. If you only develop one, your doctor may advise that you leave it alone (provided it is not causing any symptoms). It may go away; however, see a doctor if you notice any change in the appearance or if it becomes tender. You may be advised to apply a moisturising (emollient) cream to help soften the skin around the actinic keratosis. You may also be advised to apply sun cream with a high sun protection factor (SPF). High-factor sunscreens also moisturise, but importantly they will prevent further sun damage to the skin (and hopefully prevent the development of further actinic keratoses). If you spend a lot of time in the sun you have an increased risk of developing actinic keratoses and skin cancer. You will also prematurely age your skin and cause wrinkles.  To reduce the risk of developing skin cancers, actinic keratoses and other conditions associated with sun-damaged skin, we should all:  ? Stay out of strong sunlight. In particular, avoid sun between 11 am and 3 pm.  ? When out in the sun:  o Seek natural shade in the form of trees or other shelter.  o Wear clothes as a sunscreen, including T-shirts, long-sleeved shirts, and hats.  o Use a broad-spectrum sunscreen with an SPF of 30 or higher to protect against UVB and UVA.  o Reapply sunscreen regularly, particularly if you are swimming, or sweating a lot, or after towelling yourself dry.  o Use plenty of sunscreen. At the very least, six full teaspoons are needed to cover the body of an average adult.    Freezing  Liquid nitrogen is a common treatment in people who have small numbers of actinic keratoses. It is also called cryotherapy or cryosurgery. Liquid nitrogen is so cold that it destroys tissue. An actinic keratosis can be easily sprayed with liquid nitrogen. It is destroyed and then falls off a few days later. A small scab is left and is gradually replaced by fresh healthy skin. Liquid nitrogen often causes the surrounding skin to blister for a few days. Sometimes this form of treatment can leave a white spot on the skin after treatment.  ???   This therapy is performed by the doctor. It may cause a stinging pain that may last a few minutes.  ? It is used as a ???spot??? treatment meaning a few actinic keratoses are frozen at a time.  ???   It is normal for the skin to become red and swollen and may blister a few hours or days after the treatment. If a blister forms, use a clean needle to pop the blister and apply vaseline (petrolatum ointment) to the area until it heals.  The redness and blisters are a part of the treatment and will heal on their own.  ???   A scab (crust) will form that will fall off on its own in 2-3 weeks. After the scabs falls off, there will be healthy skin underneath.  Sometimes there is  a small white scar in the area.    After your spot has been treated, look at the spot carefully.  If you still notice a scaly spot in the area, please let your dermatologist know when you return to clinic.

## 2018-09-01 NOTE — Unmapped (Signed)
Addended by: Inis Sizer A on: 08/31/2018 08:18 PM     Modules accepted: Level of Service

## 2018-09-13 NOTE — Unmapped (Signed)
[facility] Refill Coordination Note    Specialty Medication(s) to be Shipped:   Transplant: Myfortic 180mg  and Prograf 0.5mg     Other medication(s) to be shipped: alendronate (FOSAMAX) 70 MG        Sophia Davis, DOB: 08/18/1946  Phone: [phone] (home)       All above HIPAA information was verified with patient.     Completed refill call assessment today to schedule patient's medication shipment from the [facility] [phone]).       Specialty medication(s) and dose(s) confirmed: Regimen is correct and unchanged.   Changes to medications: [name] reports no changes reported at this time.  Changes to insurance: No  Questions for the pharmacist: No    The patient will receive a drug information handout for each medication shipped and additional FDA Medication Guides as required.      DISEASE/MEDICATION-SPECIFIC INFORMATION        N/A    ADHERENCE     Medication Adherence    Patient reported X missed doses in the last month:  0      Adherence tools used:  patient uses a pill box to manage medications                      MEDICARE PART B DOCUMENTATION     Myfortic 180mg : Patient has 10 days worth of tablets on hand.  Prograf 0.5mg : Patient has 10 day worth of capsules on hand.    SHIPPING     Shipping address confirmed in Epic.     Delivery Scheduled: Yes, Expected medication delivery date: 09/20/18 via UPS or courier.     Medication will be delivered via Same Day Courier to the home address in Epic [place].    Sophia Davis   [facility] Specialty Technician

## 2018-09-20 MED FILL — ALENDRONATE 70 MG TABLET: 28 days supply | Qty: 4 | Fill #3

## 2018-09-20 MED FILL — MYFORTIC 180 MG TABLET,DELAYED RELEASE: 30 days supply | Qty: 120 | Fill #0 | Status: AC

## 2018-09-20 MED FILL — MYFORTIC 180 MG TABLET,DELAYED RELEASE: 30 days supply | Qty: 120 | Fill #0

## 2018-09-20 MED FILL — ALENDRONATE 70 MG TABLET: 28 days supply | Qty: 4 | Fill #3 | Status: AC

## 2018-09-20 MED FILL — PROGRAF 0.5 MG CAPSULE: 30 days supply | Qty: 120 | Fill #3 | Status: AC

## 2018-09-20 MED FILL — PROGRAF 0.5 MG CAPSULE: ORAL | 30 days supply | Qty: 120 | Fill #3

## 2018-09-26 MED ORDER — LOSARTAN 50 MG TABLET: 50 mg | tablet | Freq: Two times a day (BID) | 0 refills | 0 days | Status: AC

## 2018-09-27 MED FILL — LOSARTAN 50 MG TABLET: ORAL | 90 days supply | Qty: 180 | Fill #0

## 2018-09-27 MED FILL — LOSARTAN 50 MG TABLET: 90 days supply | Qty: 180 | Fill #0 | Status: AC

## 2018-10-14 NOTE — Unmapped (Addendum)
[facility] Refill Coordination Note    Specialty Medication(s) to be Shipped:   Transplant: Myfortic 180mg  and Prograf 0.5mg     Other medication(s) to be shipped: alendronate 70mg ,amlodipine 10mg , chlorthalidone 25mg      Sophia Davis, DOB: 1946-10-08  Phone: [phone] (home)       All above HIPAA information was verified with patient.     Completed refill call assessment today to schedule patient's medication shipment from the [facility] [phone]).       Specialty medication(s) and dose(s) confirmed: Regimen is correct and unchanged.   Changes to medications: Sophia Davis reports no changes reported at this time.  Changes to insurance: No  Questions for the pharmacist: No    The patient will receive a drug information handout for each medication shipped and additional FDA Medication Guides as required.      DISEASE/MEDICATION-SPECIFIC INFORMATION        N/A    ADHERENCE     Medication Adherence        Adherence tools used:  patient uses a pill box to manage medications                      MEDICARE PART B DOCUMENTATION     Myfortic 180mg : Patient has 2 weeks of tablets on hand.  Prograf 0.5mg : Patient has 2 weeks of capsules on hand.    SHIPPING     Shipping address confirmed in Epic.     Delivery Scheduled: Yes, Expected medication delivery date: 10/27/18 via UPS or courier.     Medication will be delivered via Same Day Courier to the home address in Epic [place].    Sophia Davis   [facility] Specialty Technician

## 2018-10-24 ENCOUNTER — Ambulatory Visit: Admit: 2018-10-24 | Discharge: 2018-10-24 | Payer: MEDICARE

## 2018-10-24 DIAGNOSIS — D899 Disorder involving the immune mechanism, unspecified: Secondary | ICD-10-CM

## 2018-10-24 DIAGNOSIS — I776 Arteritis, unspecified: Secondary | ICD-10-CM

## 2018-10-24 DIAGNOSIS — M3130 Wegener's granulomatosis without renal involvement: Secondary | ICD-10-CM

## 2018-10-24 DIAGNOSIS — Z940 Kidney transplant status: Principal | ICD-10-CM

## 2018-10-24 DIAGNOSIS — N2581 Secondary hyperparathyroidism of renal origin: Secondary | ICD-10-CM

## 2018-10-24 DIAGNOSIS — Z1211 Encounter for screening for malignant neoplasm of colon: Secondary | ICD-10-CM

## 2018-10-24 DIAGNOSIS — M65332 Trigger finger, left middle finger: Secondary | ICD-10-CM

## 2018-10-24 DIAGNOSIS — C539 Malignant neoplasm of cervix uteri, unspecified: Secondary | ICD-10-CM

## 2018-10-24 LAB — CBC W/ AUTO DIFF
BASOPHILS RELATIVE PERCENT: 1.3 %
EOSINOPHILS ABSOLUTE COUNT: 0.2 10*9/L (ref 0.0–0.4)
EOSINOPHILS RELATIVE PERCENT: 2.9 %
HEMATOCRIT: 40 % (ref 36.0–46.0)
HEMOGLOBIN: 12.8 g/dL (ref 12.0–16.0)
LARGE UNSTAINED CELLS: 3 % (ref 0–4)
LYMPHOCYTES ABSOLUTE COUNT: 1.1 10*9/L — ABNORMAL LOW (ref 1.5–5.0)
LYMPHOCYTES RELATIVE PERCENT: 19.8 %
MEAN CORPUSCULAR HEMOGLOBIN CONC: 32 g/dL (ref 31.0–37.0)
MEAN CORPUSCULAR HEMOGLOBIN: 27.9 pg (ref 26.0–34.0)
MEAN CORPUSCULAR VOLUME: 87.1 fL (ref 80.0–100.0)
MEAN PLATELET VOLUME: 8.3 fL (ref 7.0–10.0)
MONOCYTES ABSOLUTE COUNT: 0.5 10*9/L (ref 0.2–0.8)
NEUTROPHILS ABSOLUTE COUNT: 3.4 10*9/L (ref 2.0–7.5)
NEUTROPHILS RELATIVE PERCENT: 63.2 %
RED BLOOD CELL COUNT: 4.59 10*12/L (ref 4.00–5.20)
RED CELL DISTRIBUTION WIDTH: 14.4 % (ref 12.0–15.0)
WBC ADJUSTED: 5.4 10*9/L (ref 4.5–11.0)

## 2018-10-24 LAB — COMPREHENSIVE METABOLIC PANEL
ALBUMIN: 4.3 g/dL (ref 3.5–5.0)
ALKALINE PHOSPHATASE: 70 U/L (ref 38–126)
ALT (SGPT): 13 U/L (ref <35)
ANION GAP: 8 mmol/L (ref 7–15)
AST (SGOT): 20 U/L (ref 14–38)
BLOOD UREA NITROGEN: 20 mg/dL (ref 7–21)
BUN / CREAT RATIO: 17
CALCIUM: 10 mg/dL (ref 8.5–10.2)
CHLORIDE: 97 mmol/L — ABNORMAL LOW (ref 98–107)
CO2: 32 mmol/L — ABNORMAL HIGH (ref 22.0–30.0)
CREATININE: 1.18 mg/dL — ABNORMAL HIGH (ref 0.60–1.00)
EGFR CKD-EPI AA FEMALE: 53 mL/min/{1.73_m2} — ABNORMAL LOW (ref >=60)
EGFR CKD-EPI NON-AA FEMALE: 46 mL/min/{1.73_m2} — ABNORMAL LOW (ref >=60)
GLUCOSE RANDOM: 114 mg/dL (ref 70–179)
POTASSIUM: 3.3 mmol/L — ABNORMAL LOW (ref 3.5–5.0)
PROTEIN TOTAL: 7 g/dL (ref 6.5–8.3)
SODIUM: 137 mmol/L (ref 135–145)

## 2018-10-24 LAB — PHOSPHORUS: Phosphate:MCnc:Pt:Ser/Plas:Qn:: 4.1

## 2018-10-24 LAB — MEAN CORPUSCULAR VOLUME: Lab: 87.1

## 2018-10-24 LAB — CO2: Carbon dioxide:SCnc:Pt:Ser/Plas:Qn:: 32 — ABNORMAL HIGH

## 2018-10-24 LAB — MAGNESIUM: Magnesium:MCnc:Pt:Ser/Plas:Qn:: 1.4 — ABNORMAL LOW

## 2018-10-24 LAB — TACROLIMUS, TROUGH: Lab: 4.1 — ABNORMAL LOW

## 2018-10-24 NOTE — Unmapped (Signed)
1. See new medication list.

## 2018-10-25 LAB — CMV DNA, QUANTITATIVE, PCR: CMV VIRAL LD: DETECTED — AB

## 2018-10-25 LAB — CMV QUANT LOG10: Lab: 0

## 2018-10-25 LAB — EBV VIRAL LOAD RESULT: Lab: NOT DETECTED

## 2018-10-27 MED ORDER — ALENDRONATE 70 MG TABLET
ORAL_TABLET | ORAL | 12 refills | 0 days | Status: CP
Start: 2018-10-27 — End: 2019-02-14
  Filled 2018-10-27: qty 4, 28d supply, fill #0

## 2018-10-27 MED FILL — CHLORTHALIDONE 25 MG TABLET: 90 days supply | Qty: 90 | Fill #1 | Status: AC

## 2018-10-27 MED FILL — CHLORTHALIDONE 25 MG TABLET: ORAL | 90 days supply | Qty: 90 | Fill #1

## 2018-10-27 MED FILL — PROGRAF 0.5 MG CAPSULE: 30 days supply | Qty: 120 | Fill #4 | Status: AC

## 2018-10-27 MED FILL — PROGRAF 0.5 MG CAPSULE: ORAL | 30 days supply | Qty: 120 | Fill #4

## 2018-10-27 MED FILL — ALENDRONATE 70 MG TABLET: 28 days supply | Qty: 4 | Fill #0 | Status: AC

## 2018-10-27 NOTE — Unmapped (Signed)
Internal Medicine Clinic Visit    Reason for visit: Follow-up chronic kidney disease and several other problems.    A/P:        Sophia Davis was seen today for follow-up.    Diagnoses and all orders for this visit:    Kidney transplant 05/17/2013  -     CBC w/ Differential; Future  -     Comprehensive metabolic panel; Future  I have ordered her transplant labs today and send a copy of them to Dr. Toni Arthurs.  We have agreed to recommend dietary approaches for the potassium and her tacrolimus level is in a reasonable  Immunosuppression (CMS-HCC)  -     Tacrolimus Level, Trough; Future  Her tacrolimus level is above 4 and Dr. Toni Arthurs is happy with anything between 4 and 6.  Continue present dosing.  Arteritis (CMS-HCC)  She has a history of granulomatous polyangiitis.  She led to her renal failure.  She has not had any symptoms of cough or hemoptysis.  Trigger middle finger of left hand  -     Ambulatory referral to Orthopedic Surgery; Future  Trigger finger with tenderness along her flexor tendon.  Refer to Dr. Jarold Motto orthopedic surgery  Granulomatosis with polyangiitis, unspecified whether renal involvement (CMS-HCC)  She has had a kidney transplant and is on ongoing immunosuppressive therapy.  This seems to be managing her polyangiitis as well as her transplant.  Malignant neoplasm of cervix, unspecified site (CMS-HCC)  She had cervical cancer with a hysterectomy 1998.  She is remained asymptomatic and will no longer do Pap smears.  Secondary hyperparathyroidism (CMS-HCC)  Continue on vitamin D therapy.  Colon cancer screening  -     Colonoscopy; Future  She is due for 10-year colonoscopy and referral made.        __________________________________________________________    HPI:    73 y.o. female here for follow-up.  Her main complaint today is a trigger finger in her left third finger.  She finds it is actually hard for her to flex the finger as opposed to extend.  She is very tender along the flexor tendon in her palmar side of the hand.  She would like to get this evaluated and I have referred her to Dr. Jarold Motto orthopedics.    She is remaining active and going for walks frequently.  She feels good and has lots of energy.  She is taking her medications reliably.  And her blood pressure remains under good control.  She recognizes that she is due for colonoscopy and will go ahead and schedule it for this spring.        __________________________________________________________      Medications:  See below.    __________________________________________________________    Physical Exam:   Vital Signs:  Vitals:    10/24/18 0948   BP: 125/60   Pulse: 64   Weight: 57.6 kg (127 lb)   Height: 154.9 cm (5' 1)       Gen: Well appearing, NAD  CV: RRR, no murmurs  Pulm: CTA bilaterally, no crackles or wheezes  Ext: No edema         Your Medication List          Accurate as of October 24, 2018 11:59 PM. If you have any questions, ask your nurse or doctor.            STOP taking these medications    metoprolol tartrate 25 MG tablet  Commonly known as:  LOPRESSOR  Stopped by:  Dolly Harbach Nelida Meuse, MD     ranitidine 150 MG tablet  Commonly known as:  ZANTAC  Stopped by:  Rockne Menghini, MD        CHANGE how you take these medications    amLODIPine 5 MG tablet  Commonly known as:  NORVASC  Take 5 mg by mouth every evening.  What changed:  Another medication with the same name was removed. Continue taking this medication, and follow the directions you see here.  Changed by:  Rockne Menghini, MD     losartan 50 MG tablet  Commonly known as:  COZAAR  Take 1 tablet (50 mg total) by mouth two (2) times a day.  What changed:  Another medication with the same name was removed. Continue taking this medication, and follow the directions you see here.  Changed by:  Rockne Menghini, MD     MYFORTIC 180 MG EC tablet  Generic drug:  mycophenolate  TAKE 2 TABLETS (360 MG TOTAL) BY MOUTH TWO (2) TIMES A DAY.  What changed:  Another medication with the same name was removed. Continue taking this medication, and follow the directions you see here.  Changed by:  Rockne Menghini, MD        CONTINUE taking these medications    albuterol 90 mcg/actuation inhaler  Commonly known as:  PROVENTIL HFA;VENTOLIN HFA  Inhale 2 puffs every six (6) hours as needed for wheezing or shortness of breath (Cough).     alendronate 70 MG tablet  Commonly known as:  FOSAMAX  TAKE 1 TABLET (70 MG) BY MOUTH EVERY 7 DAYS WITH A FULL GLASS OF WATER. DO NOT LIE DOWN FOR 30 MINUTES.     aspirin 81 MG chewable tablet  Chew 81 mg daily.     atorvastatin 40 MG tablet  Commonly known as:  LIPITOR  TAKE 1 TABLET (40 MG TOTAL) BY MOUTH EVERY EVENING.     chlorthalidone 25 MG tablet  Commonly known as:  HYGROTON  TAKE 1 TABLET BY MOUTH IN THE MORNING     cholecalciferol 1,000 unit (25 mcg) tablet  Generic drug:  cholecalciferol (vitamin D3)  TAKE 1 CAPSULE BY MOUTH ONCE DAILY     PROGRAF 0.5 MG capsule  Generic drug:  tacrolimus  Take 2 capsules (1 mg total) by mouth two (2) times a day.

## 2018-11-09 MED ORDER — ATORVASTATIN 40 MG TABLET
ORAL_TABLET | 3 refills | 0 days | Status: CP
Start: 2018-11-09 — End: 2019-11-09
  Filled 2018-11-14: qty 90, 90d supply, fill #0

## 2018-11-09 NOTE — Unmapped (Signed)
[facility] Refill and Clinical Coordination Note  Medication(s): myfortic  Pt just filled prograf on 1/9, requests call back in 2 weeks for refill on it  Went over all meds with pt on call today. Besides meds listed in this note, pt denies needing ANYTHING else filled at this time. Pt aware to call us with any needs/concerns/refills if need arises prior to our next scheduled refill call.      Sophia Davis, DOB: October 21, 1945  Phone: [phone] (home) , Alternate phone contact: N/A  Shipping address: [address]  [place] [address]  Phone or address changes today?: No  All above HIPAA information verified.  Insurance changes? No    Completed refill and clinical call assessment today to schedule patient's medication shipment from the [facility] [phone]).      MEDICATION RECONCILIATION    Confirmed the medication and dosage are correct and have not changed: Yes, regimen is correct and unchanged.    Were there any changes to your medication(s) in the past month:  No, there are no changes reported at this time.    ADHERENCE    Is this medicine transplant or covered by Medicare Part B? Yes.    Myfortic 180mg : Patient has 10 days worth of tablets on hand.    Did you miss any doses in the past 4 weeks? No missed doses reported.  Adherence counseling provided? Not needed     SIDE EFFECT MANAGEMENT    Are you tolerating your medication?:  Sophia Davis reports tolerating the medication.  Side effect management discussed: None      Therapy is appropriate and should be continued.    Evidence of clinical benefit: Do you feel that that the medication is helping? Yes      FINANCIAL/SHIPPING    Delivery Scheduled: Yes, Expected medication delivery date: 11/14/2018 pending refills sent in for lipitor    Medication will be delivered via Same Day Courier to the home address in [place].    Additional medications refilled: atorvastatin (needs refills) and vit d - 3rx total. The patient will receive a drug information handout for each medication shipped and additional FDA Medication Guides as required.      Sophia Davis did not have any additional questions at this time.    Delivery address confirmed in Epic.     We will follow up with patient monthly for standard refill processing and delivery.      Thank you,  Thad Ranger   [facility] Shared [facility] Specialty Pharmacist

## 2018-11-14 MED FILL — VITAMIN D3 25 MCG (1,000 UNIT) TABLET: ORAL | 90 days supply | Qty: 90 | Fill #1

## 2018-11-14 MED FILL — MYFORTIC 180 MG TABLET,DELAYED RELEASE: 30 days supply | Qty: 120 | Fill #3

## 2018-11-14 MED FILL — ATORVASTATIN 40 MG TABLET: 90 days supply | Qty: 90 | Fill #0 | Status: AC

## 2018-11-14 MED FILL — VITAMIN D3 25 MCG (1,000 UNIT) TABLET: 90 days supply | Qty: 90 | Fill #1 | Status: AC

## 2018-11-14 MED FILL — MYFORTIC 180 MG TABLET,DELAYED RELEASE: 30 days supply | Qty: 120 | Fill #3 | Status: AC

## 2018-11-24 NOTE — Unmapped (Signed)
Patient does not need a refill of specialty medication at this time. Moving specialty refill reminder call to appropriate date and removed call attempt data.  Spoke with patient & she states she still has 2 plus weeks worth of medications ( Myfortic & Prograf) on hand and does not need any refills at this time.  Patient confirmed she has not missed any doses or started any new medications.

## 2018-11-28 MED ORDER — PEG-ELECTROLYTE SOLUTION 420 GRAM ORAL SOLUTION
0 refills | 0 days | Status: CP
Start: 2018-11-28 — End: 2019-02-14

## 2018-12-05 NOTE — Unmapped (Signed)
[facility] Refill Coordination Note    Specialty Medication(s) to be Shipped:   Transplant: Myfortic 180mg  and Prograf 0.5mg     Other medication(s) to be shipped: Aldendronate 70mg     **Faxed MD on Myfortic     Sophia Davis, DOB: 15-Dec-1945  Phone: [phone] (home)       All above HIPAA information was verified with patient.     Completed refill call assessment today to schedule patient's medication shipment from the [facility] [phone]).       Specialty medication(s) and dose(s) confirmed: Regimen is correct and unchanged.   Changes to medications: Sophia Davis reports no changes reported at this time.  Changes to insurance: No  Questions for the pharmacist: No    The patient will receive a drug information handout for each medication shipped and additional FDA Medication Guides as required.      DISEASE/MEDICATION-SPECIFIC INFORMATION        N/A    ADHERENCE     Medication Adherence    Patient reported X missed doses in the last month:  0  Adherence tools used:  patient uses a pill box to manage medications              MEDICARE PART B DOCUMENTATION     Myfortic 180mg : Patient has 10 days worth of tablets on hand.  Prograf 0.5mg : Patient has 10 days worth of capsules on hand.    SHIPPING     Shipping address confirmed in Epic.     Delivery Scheduled: Yes, Expected medication delivery date: 12/08/18 via UPS or courier.     Medication will be delivered via Next Day Courier to the home address in [place].    Sophia Davis   [facility] Shared [facility] Specialty Technician

## 2018-12-07 MED ORDER — MYFORTIC 180 MG TABLET,DELAYED RELEASE: tablet | 0 refills | 0 days | Status: AC

## 2018-12-07 MED ORDER — MYFORTIC 180 MG TABLET,DELAYED RELEASE
ORAL_TABLET | ORAL | PRN refills | 0.00000 days | Status: CP
Start: 2018-12-07 — End: 2019-12-07
  Filled 2018-12-07: qty 120, 30d supply, fill #0

## 2018-12-07 MED FILL — MYFORTIC 180 MG TABLET,DELAYED RELEASE: 30 days supply | Qty: 120 | Fill #0 | Status: AC

## 2018-12-07 MED FILL — ALENDRONATE 70 MG TABLET: ORAL | 28 days supply | Qty: 4 | Fill #1

## 2018-12-07 MED FILL — ALENDRONATE 70 MG TABLET: 28 days supply | Qty: 4 | Fill #1 | Status: AC

## 2018-12-07 MED FILL — PROGRAF 0.5 MG CAPSULE: ORAL | 30 days supply | Qty: 120 | Fill #5

## 2018-12-07 MED FILL — PROGRAF 0.5 MG CAPSULE: 30 days supply | Qty: 120 | Fill #5 | Status: AC

## 2018-12-07 NOTE — Unmapped (Signed)
Sophia Davis 's MYFORTIC shipment will be delayed due to No refills We have contacted the patient and communicated the delivery change to patient/caregiver We will call the patient to reschedule the delivery upon resolution. We have confirmed the delivery date as N/A .

## 2018-12-07 NOTE — Unmapped (Signed)
Myfortic will not be delayed because the provider was able to get [place] the new RX in time.

## 2018-12-26 MED ORDER — LOSARTAN 50 MG TABLET
ORAL_TABLET | Freq: Two times a day (BID) | ORAL | PRN refills | 90 days | Status: CP
Start: 2018-12-26 — End: ?
  Filled 2018-12-27: qty 180, 90d supply, fill #0

## 2018-12-27 MED FILL — LOSARTAN 50 MG TABLET: 90 days supply | Qty: 180 | Fill #0 | Status: AC

## 2018-12-29 NOTE — Unmapped (Signed)
Patient denied medication refills. Patient stated that they have over 4 weeks worth of medication on hand. Moving specialty refill call to appropriate date.

## 2018-12-30 ENCOUNTER — Ambulatory Visit: Admit: 2018-12-30 | Discharge: 2018-12-30 | Payer: MEDICARE

## 2018-12-30 ENCOUNTER — Encounter: Admit: 2018-12-30 | Discharge: 2018-12-30 | Payer: MEDICARE

## 2018-12-30 DIAGNOSIS — R85612 Low grade squamous intraepithelial lesion on cytologic smear of anus (LGSIL): Principal | ICD-10-CM

## 2018-12-30 DIAGNOSIS — N184 Chronic kidney disease, stage 4 (severe): Principal | ICD-10-CM

## 2018-12-30 DIAGNOSIS — I10 Essential (primary) hypertension: Principal | ICD-10-CM

## 2018-12-30 DIAGNOSIS — A630 Anogenital (venereal) warts: Principal | ICD-10-CM

## 2018-12-30 DIAGNOSIS — M3130 Wegener's granulomatosis without renal involvement: Principal | ICD-10-CM

## 2018-12-30 DIAGNOSIS — I2510 Atherosclerotic heart disease of native coronary artery without angina pectoris: Principal | ICD-10-CM

## 2018-12-30 DIAGNOSIS — D899 Disorder involving the immune mechanism, unspecified: Principal | ICD-10-CM

## 2018-12-31 DIAGNOSIS — D899 Disorder involving the immune mechanism, unspecified: Principal | ICD-10-CM

## 2018-12-31 DIAGNOSIS — N184 Chronic kidney disease, stage 4 (severe): Principal | ICD-10-CM

## 2018-12-31 DIAGNOSIS — I10 Essential (primary) hypertension: Principal | ICD-10-CM

## 2018-12-31 DIAGNOSIS — I2510 Atherosclerotic heart disease of native coronary artery without angina pectoris: Principal | ICD-10-CM

## 2018-12-31 DIAGNOSIS — M3130 Wegener's granulomatosis without renal involvement: Principal | ICD-10-CM

## 2019-01-02 DIAGNOSIS — R85612 Low grade squamous intraepithelial lesion on cytologic smear of anus (LGSIL): Principal | ICD-10-CM

## 2019-01-02 NOTE — Unmapped (Signed)
Patient with nodule at dentate line consistent with LSIL. Will refer to lower GI surgery for surveillance. Pt informed.

## 2019-01-12 DIAGNOSIS — I2510 Atherosclerotic heart disease of native coronary artery without angina pectoris: Principal | ICD-10-CM

## 2019-01-12 DIAGNOSIS — N184 Chronic kidney disease, stage 4 (severe): Principal | ICD-10-CM

## 2019-01-12 DIAGNOSIS — M3130 Wegener's granulomatosis without renal involvement: Principal | ICD-10-CM

## 2019-01-12 DIAGNOSIS — I10 Essential (primary) hypertension: Principal | ICD-10-CM

## 2019-01-12 DIAGNOSIS — D899 Disorder involving the immune mechanism, unspecified: Principal | ICD-10-CM

## 2019-01-12 NOTE — Unmapped (Signed)
[facility] transplant nephrology clinic visit    assessment and plan  1. s/p kidney transplant 05/17/13. baseline creatinine 1-1.2 mg/dl. no proteinuria. +chr borderline donor specific hla class2 ab detected. no change in maintenance immunosuppression. tacrolimus 12hr lvl 4-7 ng/ml.  2. hypertension. blood pressure goal < 130/80 mmhg.   3. low lvl cmv viremia. no intervention for asymptomatic cmv pcr < 200.  4. health maintenance. influenza '19. pcv13 pneumococcal '14. ppsv23 pneumococcal '16. zoster '19. mammogram '19. gyn exam '13 with no hx abnl pap sm. nl cystoscopy '14. colonoscopy '10 with 10 year follow up recommended. dermatology appt '16 with follow up scheduled '19. renal ultrasound '19.    history of present illness    [name] is a 73 year old woman seen in follow up post kidney transplant 05/17/2013. she feels well. no fever chills or sweats. no headache or lightheaded. no chest pain palpitations orthopnea or shortness of breath. +mild dependent edema. no abdominal pain no n/v/d. no dysuria hematuria or difficulty voiding. no myalgias. no tremor paresthesias or weakness. all other systems reviewed and negative x10 systems.    past medical hx:   1. s/p deceased donor kidney transplant 05/17/2013. mpo-anca+ chronic gn. alemtuzumab induction. baseline creatinine 1-1.2 mg/dl.  2. mpo-anca+ vasculitis/granulomatosis with polyangiitis. hx pulmonary hemorrhage. trtmt plasma exchange, iv methylprednisolone, iv/po cyclophosphamide x total 21m '03-'07; rituximab 1gm x2 '07.  3. hypertension   4. coronary artery disease s/p pci/stent '97. nl cards stress '13: nl lvef > 65% no myocardial ischemia   5. hyperlipidemia  6. osteoporosis  7. secondary/tertiary hyperparathyroidism     past surgical hx: total abdominal hysterectomy '98. 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 kidney transplant '14. right hand 3rd/4th trigger finger release '16.    allergies: nkda     medications: tacrolimus 1mg  bid, mycophenolate sodium 360mg  bid, losartan 50mg  bid, metoprolol 12.5mg  bid, atorvastatin 40mg  daily, aspirin 81mg  daily, vitamin d3 1000iu daily, alendronate 70mg  q.wk.    soc hx: widowed x3 children. distant smoking hx; none > 40 yrs.    physical exam: t97.3 p68 bp116/68 wt59.6kg bmi24.8. wd/wn woman nl/appropriate affect and mood. nl sclera anicteric. mmm no thrush +denture plates. neck supple no palpable ln. heart rrr nl s1s2 no m/r/g. lungs clear bilateral. abdomen soft nt/nd. trace-none edema. msk no synovitis or tophi. skin no rash. neuro alert oriented non focal exam.    labs 08/10/18: wbc6.0 hgb12.1 hct37.5 plt274. ZO109 k3.1 cl96 bicarb30 bun18 cr1.2 glc104 ca8.8 mg1.4 phos3.3 albumin4.1 liver function panel nl. cmv pcr 76. tacrolimus lvl 6.5 ng/ml. urine protein/cr 0.056.

## 2019-01-19 NOTE — Unmapped (Signed)
[facility] Refill Coordination Note    Specialty Medication(s) to be Shipped:   Transplant: Myfortic 180mg  and Prograf 0.5mg     Other medication(s) to be shipped:   Chlorthalidone 25mg   Alendronate 70mg      Sophia Davis, DOB: 08/28/1946  Phone: [phone] (home)       All above HIPAA information was verified with patient.     Completed refill call assessment today to schedule patient's medication shipment from the [facility] [phone]).       Specialty medication(s) and dose(s) confirmed: Regimen is correct and unchanged.   Changes to medications: Sophia Davis reports no changes reported at this time.  Changes to insurance: No  Questions for the pharmacist: No    Confirmed patient received Welcome Packet with first shipment. The patient will receive a drug information handout for each medication shipped and additional FDA Medication Guides as required.       DISEASE/MEDICATION-SPECIFIC INFORMATION        N/A    SPECIALTY MEDICATION ADHERENCE     Medication Adherence    Patient reported X missed doses in the last month:  0  Adherence tools used:  patient uses a pill box to manage medications          Myfortic 180mg : Patient has 10 days worth of tablets on hand.   Prograf 0.5mg : Patient has 10 days worth of capsules on hand.           SHIPPING     Shipping address confirmed in Epic.     Delivery Scheduled: Yes, Expected medication delivery date: 01/24/19.     Medication will be delivered via Next Day Courier to the home address in Epic [place].    Sophia Davis   [facility] Shared [facility] Specialty Technician

## 2019-01-23 MED FILL — PROGRAF 0.5 MG CAPSULE: ORAL | 30 days supply | Qty: 120 | Fill #6

## 2019-01-23 MED FILL — PROGRAF 0.5 MG CAPSULE: 30 days supply | Qty: 120 | Fill #6 | Status: AC

## 2019-01-23 MED FILL — MYFORTIC 180 MG TABLET,DELAYED RELEASE: 30 days supply | Qty: 120 | Fill #1

## 2019-01-23 MED FILL — ALENDRONATE 70 MG TABLET: ORAL | 28 days supply | Qty: 4 | Fill #2

## 2019-01-23 MED FILL — MYFORTIC 180 MG TABLET,DELAYED RELEASE: 30 days supply | Qty: 120 | Fill #1 | Status: AC

## 2019-01-23 MED FILL — ALENDRONATE 70 MG TABLET: 28 days supply | Qty: 4 | Fill #2 | Status: AC

## 2019-01-23 MED FILL — CHLORTHALIDONE 25 MG TABLET: 90 days supply | Qty: 90 | Fill #2 | Status: AC

## 2019-01-23 MED FILL — CHLORTHALIDONE 25 MG TABLET: ORAL | 90 days supply | Qty: 90 | Fill #2

## 2019-02-14 DIAGNOSIS — Z940 Kidney transplant status: Principal | ICD-10-CM

## 2019-02-14 DIAGNOSIS — D899 Disorder involving the immune mechanism, unspecified: Secondary | ICD-10-CM

## 2019-02-14 MED ORDER — ALENDRONATE 70 MG TABLET: 70 mg | tablet | 12 refills | 0 days | Status: AC

## 2019-02-14 MED ORDER — FAMOTIDINE 20 MG TABLET
ORAL_TABLET | Freq: Two times a day (BID) | ORAL | 2 refills | 0.00000 days | Status: CP | PRN
Start: 2019-02-14 — End: 2019-02-23

## 2019-02-14 MED ORDER — ALENDRONATE 70 MG TABLET
ORAL_TABLET | ORAL | 12 refills | 0.00000 days | Status: CP
Start: 2019-02-14 — End: 2020-02-14

## 2019-02-16 MED ORDER — CHOLECALCIFEROL (VITAMIN D3) 25 MCG (1,000 UNIT) TABLET
ORAL_TABLET | ORAL | 3 refills | 0 days | Status: CP
Start: 2019-02-16 — End: 2020-02-16
  Filled 2019-02-21: qty 90, 90d supply, fill #0

## 2019-02-16 NOTE — Unmapped (Signed)
[facility] Refill Coordination Note    Specialty Medication(s) to be Shipped:   Transplant: Myfortic 180mg  and Prograf 0.5mg   Other medication(s) to be shipped: Atorvastatin 40mg , Famotidine 20mg  & Vitamin D     Sophia Davis, DOB: [birth year]  Phone: [phone] (home)     All above HIPAA information was verified with patient.     Completed refill call assessment today to schedule patient's medication shipment from the [facility] [phone]).       Specialty medication(s) and dose(s) confirmed: Regimen is correct and unchanged.   Changes to medications: Sophia Davis reports no changes reported at this time.  Changes to insurance: No  Questions for the pharmacist: No    Confirmed patient received Welcome Packet with first shipment. The patient will receive a drug information handout for each medication shipped and additional FDA Medication Guides as required.       DISEASE/MEDICATION-SPECIFIC INFORMATION        N/A    SPECIALTY MEDICATION ADHERENCE     Medication Adherence    Patient reported X missed doses in the last month:  0  Specialty Medication:  Myfortic 180mg   Patient is on additional specialty medications:  Yes  Additional Specialty Medications:  Porgraf 0.5mg   Patient Reported Additional Medication X Missed Doses in the Last Month:  0  Patient is on more than two specialty medications:  No  Any gaps in refill history greater than 2 weeks in the last 3 months:  no  Adherence tools used:  patient uses a pill box to manage medications       Prograf 0.5 mg: 10 days of medicine on hand   Myfortic 180 mg: 10 days of medicine on hand     SHIPPING     Shipping address confirmed in Epic.     Delivery Scheduled: Yes, Expected medication delivery date: 02/22/2019.     Medication will be delivered via UPS to the home address in [place].    [name] P Allena Katz   [facility] Shared [facility] Specialty Technician

## 2019-02-21 MED FILL — MYFORTIC 180 MG TABLET,DELAYED RELEASE: 30 days supply | Qty: 120 | Fill #2 | Status: AC

## 2019-02-21 MED FILL — PROGRAF 0.5 MG CAPSULE: ORAL | 30 days supply | Qty: 120 | Fill #7

## 2019-02-21 MED FILL — VITAMIN D3 25 MCG (1,000 UNIT) TABLET: 90 days supply | Qty: 90 | Fill #0 | Status: AC

## 2019-02-21 MED FILL — PROGRAF 0.5 MG CAPSULE: 30 days supply | Qty: 120 | Fill #7 | Status: AC

## 2019-02-21 MED FILL — ATORVASTATIN 40 MG TABLET: 90 days supply | Qty: 90 | Fill #1 | Status: AC

## 2019-02-21 MED FILL — ATORVASTATIN 40 MG TABLET: 90 days supply | Qty: 90 | Fill #1

## 2019-02-21 MED FILL — MYFORTIC 180 MG TABLET,DELAYED RELEASE: 30 days supply | Qty: 120 | Fill #2

## 2019-02-23 MED ORDER — FAMOTIDINE 20 MG TABLET
ORAL_TABLET | Freq: Two times a day (BID) | ORAL | 11 refills | 0.00000 days | Status: CP | PRN
Start: 2019-02-23 — End: 2020-02-23

## 2019-02-28 ENCOUNTER — Telehealth
Admit: 2019-02-28 | Discharge: 2019-03-01 | Payer: MEDICARE | Attending: Orthopaedic Surgery | Primary: Orthopaedic Surgery

## 2019-02-28 DIAGNOSIS — M6530 Trigger finger, unspecified finger: Principal | ICD-10-CM

## 2019-02-28 NOTE — Unmapped (Signed)
TELEMEDICINE VISIT    Sophia Davis    02/28/2019   11:53 AM    PATIENT LOCATION:  [facility] [place]    PROVIDER LOCATION: [facility] [place]    CHIEF COMPLAINT: Left long finger catching and locking    HPI: The patient is a 73 year old right-hand-dominant female who complains of pain, catching, and locking of her left long finger.  It is been bothering her for about 6 months.  She denies any trauma.  She is not wearing any braces or splints for this problem nor is she taking medication for this problem.  She has never had an injection into this finger.  She does report a history of a previous right finger trigger which was treated with surgical release at outside hospital.  She did well after the surgery.  She has most bothered by the catching and locking of the left long finger though she does report some pain in her left palm at the proximal aspect of the long finger.    ROS: No fevers or chills.  No numbness or tingling.    REVIEW OF DIAGNOSTIC TESTS: None indicated.    ASSESSMENT: Left long finger trigger.    PLAN: I discussed with the patient her diagnosis and treatment options.  We discussed continued observation, cortisone injection, and surgical release.  She would like to think about these options further.  She is requesting a visit in person for further discussion.  We will plan on setting her up for an in person visit in early June to discuss treatment options for her left long finger trigger.    TIME SPENT ON PHONE:  8    FOLLOW UP PLAN: June 2 in clinic    J. Megan M. Jarold Motto, M.D.    I spent 8 minutes on the phone with the patient. I spent an additional 5 minutes on pre- and post-visit activities.     The patient was physically located in [place] or a state in which I am permitted to provide care. The patient and/or parent/gauardian understood that s/he may incur co-pays and cost sharing, and agreed to the telemedicine visit. The visit was completed via phone and/or video, which was appropriate and reasonable under the circumstances given the patient's presentation at the time.    The patient and/or parent/guardian has been advised of the potential risks and limitations of this mode of treatment (including, but not limited to, the absence of in-person examination) and has agreed to be treated using telemedicine. The patient's/patient's family's questions regarding telemedicine have been answered.     If the phone/video visit was completed in an ambulatory setting, the patient and/or parent/guardian has also been advised to contact their provider???s office for worsening conditions, and seek emergency medical treatment and/or call 911 if the patient deems either necessary.

## 2019-03-14 NOTE — Unmapped (Signed)
[facility] transplant nephrology telemedicine visit    assessment and plan  1. s/p kidney transplant 05/17/13. baseline creatinine 1-1.2 mg/dl. no proteinuria. +borderline donor specific hla class2 ab detected. no change in maintenance immunosuppression. tacrolimus 12hr lvl 4-7 ng/ml.  2. hypertension. blood pressure goal < 130/80 mmhg.   3. heartburn. +famotidine 20mg  prn.  4. health maintenance. influenza '19. pcv13 pneumococcal '14. ppsv23 pneumococcal '16. zoster '19. mammogram '19. gyn exam '13/post hysterectomy. nl cystoscopy '14. colonoscopy '20. dermatology appt '19. renal ultrasound '19.    I spent 15 minutes on the phone with the patient. she does not have video capability at home. I spent an additional 10 minutes on pre- and post-visit activities.     The patient was physically located in [place] or a state in which I am permitted to provide care. The patient and/or parent/gauardian understood that s/he may incur co-pays and cost sharing, and agreed to the telemedicine visit. The visit was completed via phone and/or video, which was appropriate and reasonable under the circumstances given the patient's presentation at the time.    The patient and/or parent/guardian has been advised of the potential risks and limitations of this mode of treatment (including, but not limited to, the absence of in-person examination) and has agreed to be treated using telemedicine. The patient's/patient's family's questions regarding telemedicine have been answered.     If the phone/video visit was completed in an ambulatory setting, the patient and/or parent/guardian has also been advised to contact their provider???s office for worsening conditions, and seek emergency medical treatment and/or call 911 if the patient deems either necessary.    history of present illness    [name] is a 73 year old woman seen in follow up post kidney transplant 05/17/2013. she feels well. no fever chills or sweats. no headache or lightheaded. no chest pain palpitation cough or shortness of breath. +minimal lower extremity dependent edema. no abdominal pain no n/v/d. no muscle pain or cramping. +mild heartburn. no dysuria hematuria or difficulty voiding. all other systems reviewed and negative x10 systems.    past medical hx:   1. s/p deceased donor kidney transplant 05/17/2013. mpo-anca+ chronic gn. alemtuzumab induction. baseline creatinine 1-1.2 mg/dl.  2. mpo-anca+ vasculitis/granulomatosis with polyangiitis. hx pulmonary hemorrhage. trtmt plasma exchange, iv methylprednisolone, iv/po cyclophosphamide x total 58m '03-'07; rituximab 1gm x2 '07.  3. hypertension   4. coronary artery disease s/p pci/stent '97. nl cards stress '13: nl lvef > 65% no myocardial ischemia   5. hyperlipidemia  6. osteoporosis  7. secondary/tertiary hyperparathyroidism     past surgical hx: total abdominal hysterectomy '98. 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 kidney transplant '14. right hand 3rd/4th trigger finger release '16.    allergies: nkda     medications: tacrolimus 1mg  bid, mycophenolate sodium 360mg  bid, losartan 50mg  bid, amlodipine 5mg  qpm, chlorthalidone 25mg  daily, atorvastatin 40mg  daily, aspirin 81mg  daily, vitamin d3 1,000iu daily, alendronate 70mg  q.wk.    soc hx: widowed x3 children. distant smoking hx; none > 40 yrs.    labs 10/24/18: wbc5.4 hgb12.8 hct40 plt284. UJ811 k3.3 cl97 bicarb32 bun20 cr1.2 glc114 ca10 mg1.4 phos4.1 albumin4.3 liver function panel nl. cmv pcr < 50. ebv pcr not detected. tacrolimus lvl 4.1 ng/ml.

## 2019-03-16 NOTE — Unmapped (Signed)
[facility] Refill Coordination Note    Specialty Medication(s) to be Shipped:   Transplant: Myfortic 180mg  and Prograf 0.5mg     Other medication(s) to be shipped: lorsartan     Sophia Davis, DOB: 15-Oct-1946  Phone: [phone] (home)       All above HIPAA information was verified with patient.     Completed refill call assessment today to schedule patient's medication shipment from the [facility] [phone]).       Specialty medication(s) and dose(s) confirmed: Regimen is correct and unchanged.   Changes to medications: Lucillie reports no changes at this time.  Changes to insurance: No  Questions for the pharmacist: No    Confirmed patient received Welcome Packet with first shipment. The patient will receive a drug information handout for each medication shipped and additional FDA Medication Guides as required.       DISEASE/MEDICATION-SPECIFIC INFORMATION        N/A    SPECIALTY MEDICATION ADHERENCE     Medication Adherence    Patient reported X missed doses in the last month:  0  Specialty Medication:  prograf  Patient is on additional specialty medications:  Yes  Additional Specialty Medications:  myfortic  Patient Reported Additional Medication X Missed Doses in the Last Month:  0  Patient is on more than two specialty medications:  No  Adherence tools used:  patient uses a pill box to manage medications                myfortic 180 mg: 10 days of medicine on hand   prograf 0.5 mg: 10 days of medicine on hand         SHIPPING     Shipping address confirmed in Epic.     Delivery Scheduled: Yes, Expected medication delivery date: 03/23/19.     Medication will be delivered via Next Day Courier to the home address in Epic [place].    [name]   [facility] Specialty Technician

## 2019-03-21 ENCOUNTER — Ambulatory Visit
Admit: 2019-03-21 | Discharge: 2019-03-21 | Payer: MEDICARE | Attending: Orthopaedic Surgery | Primary: Orthopaedic Surgery

## 2019-03-21 ENCOUNTER — Ambulatory Visit: Admit: 2019-03-21 | Discharge: 2019-03-21 | Payer: MEDICARE

## 2019-03-21 DIAGNOSIS — R85612 Low grade squamous intraepithelial lesion on cytologic smear of anus (LGSIL): Principal | ICD-10-CM

## 2019-03-21 DIAGNOSIS — M6530 Trigger finger, unspecified finger: Principal | ICD-10-CM

## 2019-03-21 NOTE — Unmapped (Signed)
Your anorectal exam today was normal.  Dr. Milbert Coulter would like to see you again in 6months.    For questions about your visit with Dr. Marcella Dubs please contact:    Minette Headland, RN  Nurse Coordinator for Dr. Milbert Coulter  [phone]  Fax:[phone]  Email: [email or web address]    You may also send a mychart message to your provider: Dr. Milbert Coulter

## 2019-03-21 NOTE — Unmapped (Signed)
INTERIM HISTORY:  Sophia Davis right-hand-dominant female who presents with left long finger trigger finger.  She is previously had a telemedicine visit, but she presents today for in-person evaluation and further treatment.  She continues to have pain, catching, and locking of her left long finger.  She thinks is been going on for over 6 months.  She denies any specific trauma.  She has not worn a brace or taken any sort of medication.  She has never had an injection.  She has previously undergone right long finger trigger finger release in [place].  Of note, the patient has had a kidney transplant in 2014 and is currently on Prograf and Myfortic immunosuppression.    ROS:  No fevers, chills.  No numbness/tingling.    PHYSICAL EXAM: Left upper extremity: Atraumatic.  Stabilization over the long finger A1 pulley.  Visible triggering with full composite fist formation.  Full painless range of motion of the remaining digits.  Physician documentation median, ulnar, and radial nerve distributions remain intact and, PIN, and ulnar nerves.  Fingers are well-perfused with brisk cap refill.  2+ radial pulse.    RADIOGRAPHS: None    ASSESSMENT: Left long finger trigger    PLAN:  Sophia Davis presents for further discussion of treatment options for her left long finger trigger.  She wishes to achieve symptom relief today, so she would like to undergo a left long finger trigger injection.  She tolerated this well.  She will follow-up on an as-needed basis.    Trigger finger injection: After informed consent was obtained and a timeout was  performed, the patient's left long finger trigger finger was injected under sterile conditions using 1 mL of 40 mg of Kenalog and 0.5 mL of 1% lidocaine without epinephrine.  The patient tolerated the injection well.

## 2019-03-21 NOTE — Unmapped (Signed)
GI SURGERY CLINIC      Patient Name: Sophia Davis  Patient Age: 73 y.o.  Encounter Date: 03/21/2019  Attending Physician: Orlin Hilding, MD    Referring Provider:   Leland Her, MD  [address]  [place], [place] [address]    Primary Care Provider:   Rockne Menghini, MD      REASON FOR VISIT:  Evaluation of LSIL     ASSESSMENT:  Sophia Davis is a 73 y.o. 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 Exam demonstrates no lesions and healthy appearing mucosa.     PLAN:  Schedule follow up in 6 months for surveillance.     I saw and evaluated the patient, participating in the key portions of the service.  I reviewed the resident???s note.  I agree with the resident???s findings and plan.     Orlin Hilding, MD  Colorectal Surgery        HISTORY OF PRESENT ILLNESS:  Sophia Davis is a 73 y.o. female with pmh of CKD (s/p kidney transplantion in 2014 on immunosuppression), CAD, GERD, HTN, Hysterectomy, and granulomatous polyangiitis seen in consultation at the request of Dr. Renato Gails for evaluation of LSIL discovered on colonoscopy.  The lesion was biopsied and showed LSIL.  Patient denies any symptoms of anal pain, itching, anal bleeding, incontinence, constipation, or diarrhea. Patient denies knowing of any history of cervical cancer or abnormal pap smears.  ROS was otherwise negative. Patient states that she otherwise feels pretty good).     REVIEW OF SYSTEMS:   Denies fevers, chills, sweats, nausea, vomiting, abdominal pain, chest pain, shortness of breath, cough, heart palpitations, weight loss, change in bowel habits, melena or hematochezia. Denies lower extremity edema.  Denies dysuria, hematuria or urinary incontinence. Denies focal bony pain.  Remainder of a 10 system review of systems is negative.     ALLERGIES: has No Known Allergies.    MEDICATIONS:   Current Outpatient Medications   Medication Sig Dispense Refill   ??? alendronate (FOSAMAX) 70 MG tablet Take 1 tablet (70 mg total) by mouth every seven (7) days. 4 tablet 12   ??? amLODIPine (NORVASC) 5 MG tablet Take 5 mg by mouth every evening.     ??? aspirin 81 MG chewable tablet Chew 81 mg daily.     ??? atorvastatin (LIPITOR) 40 MG tablet TAKE 1 TABLET (40 MG TOTAL) BY MOUTH EVERY EVENING. 90 tablet 3   ??? chlorthalidone (HYGROTON) 25 MG tablet TAKE 1 TABLET BY MOUTH IN THE MORNING 90 each 3   ??? cholecalciferol, vitamin D3, 1,000 unit (25 mcg) tablet TAKE 1 CAPSULE BY MOUTH ONCE DAILY 90 tablet 3   ??? cholecalciferol, vitamin D3, 1,000 unit tablet Take 1 tablet (1,000 Units total) by mouth daily. 30 tablet 11   ??? famotidine (PEPCID) 20 MG tablet Take 1 tablet (20 mg total) by mouth two (2) times a day as needed for heartburn. 60 tablet 11   ??? losartan (COZAAR) 50 MG tablet Take 1 tablet (50 mg total) by mouth two (2) times a day. 180 tablet PRN   ??? MYFORTIC 180 mg EC tablet TAKE 2 TABLETS (360 MG TOTAL) BY MOUTH TWO (2) TIMES A DAY. 120 tablet PRN   ??? tacrolimus (PROGRAF) 0.5 MG capsule Take 2 capsules (1 mg total) by mouth two (2) times a  day. 120 capsule 11     No current facility-administered medications for this visit.        MEDICAL HISTORY:   Past Medical History:   Diagnosis Date   ??? Chronic kidney disease, stage IV (severe) (CMS-HCC) 05/27/2011   ??? Coronary artery disease    ??? Essential hypertension 09/28/2003   ??? Granulomatosis with Polyangiitis 12/17/2005   ??? Immunosuppression (CMS-HCC) 07/22/2015       SURGICAL HISTORY:  Past Surgical History:   Procedure Laterality Date   ??? CARDIAC SURGERY      stents   ??? HYSTERECTOMY  1998   ??? NEPHRECTOMY TRANSPLANTED ORGAN     ??? OOPHORECTOMY  1998   ??? PR COLONOSCOPY FLX DX W/COLLJ SPEC WHEN PFRMD N/A 12/30/2018    Procedure: COLONOSCOPY, FLEXIBLE, PROXIMAL TO SPLENIC FLEXURE; DIAGNOSTIC, W/WO COLLECTION SPECIMEN BY BRUSH OR WASH;  Surgeon: Leland Her, MD;  Location: [facility];  Service: Gastroenterology   ??? PR EXPLOR POSTOP BLEED,INFEC,CLOT-ABD Right 05/22/2013    Procedure: EXPLOR POSTOP HEMORR THROMBOSIS/INFEC; ABD;  Surgeon: Estanislado Emms, MD;  Location: MAIN OR [facility];  Service: Transplant   ??? PR TRANSPLANT,PREP CADAVER RENAL GRAFT N/A 05/17/2013    Procedure: [facility] STD PREP CAD DONR RENAL ALLOGFT PRIOR TO TRNSPLNT, INCL DISSEC/REM PERINEPH FAT, DIAPH/RTPER ATTAC;  Surgeon: Purcell Mouton, MD;  Location: MAIN OR [facility];  Service: Transplant   ??? PR TRANSPLANTATION OF KIDNEY Right 05/17/2013    Procedure: RENAL ALLOTRANSPLANTATION, IMPLANTATION OF GRAFT; WITHOUT RECIPIENT NEPHRECTOMY;  Surgeon: Purcell Mouton, MD;  Location: MAIN OR [facility];  Service: Transplant   ??? TRIGGER FINGER RELEASE Right 2016    middle and ring fingers       SOCIAL HISTORY:     reports that she has never smoked. She has never used smokeless tobacco. She reports that she does not drink alcohol or use drugs.    FAMILY HISTORY:  family history includes Emphysema in her brother; Heart attack in her father; Heart disease in her father and sister; Hypertension in her mother, sister, sister, and son; No Known Problems in her son and son.      Objective     Vital signs: There were no vitals taken for this visit.   Wt Readings from Last 3 Encounters:   12/30/18 57.6 kg (127 lb)   10/24/18 57.6 kg (127 lb)   08/10/18 59.6 kg (131 lb 6.4 oz)       General Appearance:  No acute distress, well appearing and well nourished.   HEENT:  Normocephalic, atraumatic, anicteric sclera.   Cardiovascular: Regular rate, rhythm.   Lungs: Clear to auscultation bilaterally. Normal work of breathing on room air.   GI:   Abdomen soft, non-tender, and non-distended.     Rectal:  Exterior anal skin without abnormailty. DRE demonstrated normal tone and no obvious lesions. Anoscopy performed revealing normal healthy mucosa.    Musculoskeletal: Extremities without peripheral edema, WWP. Neurologic: No motor abnormalities noted.  Sensation grossly intact.       DIAGNOSTIC STUDIES:   Colonoscopy reviewed, internal hemorrhoids and internal (mucosal) anal lesion, biopsied and found to be LSIL on pathology

## 2019-03-21 NOTE — Unmapped (Signed)
Assisted with clinic procedure, anoscopy done in clinic today.

## 2019-03-22 MED FILL — LOSARTAN 50 MG TABLET: 90 days supply | Qty: 180 | Fill #1 | Status: AC

## 2019-03-22 MED FILL — PROGRAF 0.5 MG CAPSULE: 30 days supply | Qty: 120 | Fill #8 | Status: AC

## 2019-03-22 MED FILL — MYFORTIC 180 MG TABLET,DELAYED RELEASE: 30 days supply | Qty: 120 | Fill #3

## 2019-03-22 MED FILL — PROGRAF 0.5 MG CAPSULE: ORAL | 30 days supply | Qty: 120 | Fill #8

## 2019-03-22 MED FILL — MYFORTIC 180 MG TABLET,DELAYED RELEASE: 30 days supply | Qty: 120 | Fill #3 | Status: AC

## 2019-03-22 MED FILL — LOSARTAN 50 MG TABLET: ORAL | 90 days supply | Qty: 180 | Fill #1

## 2019-03-23 NOTE — Unmapped (Signed)
Attending Attestation:  I saw and evaluated the patient, participating in the key elements of the service.  I discussed the findings, assessment and plan with the resident and agree with resident???s findings and plan as documented in the resident???s note.  I was present for the entirety of the procedure(s). Abram Sander, MD  --J. Minda Meo, MD

## 2019-04-06 MED FILL — CHLORTHALIDONE 25 MG TABLET: 90 days supply | Qty: 90 | Fill #3 | Status: AC

## 2019-04-06 MED FILL — CHLORTHALIDONE 25 MG TABLET: ORAL | 90 days supply | Qty: 90 | Fill #3

## 2019-04-11 NOTE — Unmapped (Signed)
[facility] Clinical Assessment & Refill Coordination Note    Sophia Davis, DOB: 10/28/1945  Phone: [phone] (home)     All above HIPAA information was verified with patient.     Specialty Medication(s):   Transplant: Myfortic 180mg  and Prograf 0.5mg      Current Outpatient Medications   Medication Sig Dispense Refill   ??? alendronate (FOSAMAX) 70 MG tablet Take 1 tablet (70 mg total) by mouth every seven (7) days. 4 tablet 12   ??? amLODIPine (NORVASC) 5 MG tablet Take 5 mg by mouth every evening.     ??? aspirin 81 MG chewable tablet Chew 81 mg daily.     ??? atorvastatin (LIPITOR) 40 MG tablet TAKE 1 TABLET (40 MG TOTAL) BY MOUTH EVERY EVENING. 90 tablet 3   ??? chlorthalidone (HYGROTON) 25 MG tablet TAKE 1 TABLET BY MOUTH IN THE MORNING 90 each 3   ??? cholecalciferol, vitamin D3, 1,000 unit (25 mcg) tablet TAKE 1 CAPSULE BY MOUTH ONCE DAILY 90 tablet 3   ??? famotidine (PEPCID) 20 MG tablet Take 1 tablet (20 mg total) by mouth two (2) times a day as needed for heartburn. 60 tablet 11   ??? losartan (COZAAR) 50 MG tablet Take 1 tablet (50 mg total) by mouth two (2) times a day. 180 tablet PRN   ??? MYFORTIC 180 mg EC tablet TAKE 2 TABLETS (360 MG TOTAL) BY MOUTH TWO (2) TIMES A DAY. 120 tablet PRN   ??? tacrolimus (PROGRAF) 0.5 MG capsule Take 2 capsules (1 mg total) by mouth two (2) times a day. 120 capsule 11     No current facility-administered medications for this visit.         Changes to medications: Sophia Davis reports no changes at this time.    No Known Allergies    Changes to allergies: No    SPECIALTY MEDICATION ADHERENCE     Prograf 0.5mg   : 10 days of medicine on hand   Myfortic 180mg   : 10 days of medicine on hand       Medication Adherence    Patient reported X missed doses in the last month:  0  Specialty Medication:  myfortic 180mg   Patient is on additional specialty medications:  Yes  Additional Specialty Medications:  Prograf 0.5mg   Patient Reported Additional Medication X Missed Doses in the Last Month:  0  Adherence tools used:  patient uses a pill box to manage medications          Specialty medication(s) dose(s) confirmed: Regimen is correct and unchanged.     Are there any concerns with adherence? No    Adherence counseling provided? Not needed    CLINICAL MANAGEMENT AND INTERVENTION      Clinical Benefit Assessment:    Do you feel the medicine is effective or helping your condition? Yes    Clinical Benefit counseling provided? Not needed    Adverse Effects Assessment:    Are you experiencing any side effects? No    Are you experiencing difficulty administering your medicine? No    Quality of Life Assessment:    How many days over the past month did your transplant  keep you from your normal activities? For example, brushing your teeth or getting up in the morning. 0    Have you discussed this with your provider? Not needed    Therapy Appropriateness:    Is therapy appropriate? Yes, therapy is appropriate and should be continued    DISEASE/MEDICATION-SPECIFIC  INFORMATION      N/A    PATIENT SPECIFIC NEEDS     ? Does the patient have any physical, cognitive, or cultural barriers? No    ? Is the patient high risk? Yes, patient taking a REMS drug     ? Does the patient require a Care Management Plan? No     ? Does the patient require physician intervention or other additional services (i.e. nutrition, smoking cessation, social work)? No      SHIPPING     Specialty Medication(s) to be Shipped:   Transplant: Myfortic 180mg  and Prograf 0.5mg     Other medication(s) to be shipped: none     Changes to insurance: No    Delivery Scheduled: Yes, Expected medication delivery date: 04/18/2019.     Medication will be delivered via Next Day Courier to the confirmed home address in [facility].    The patient will receive a drug information handout for each medication shipped and additional FDA Medication Guides as required.  Verified that patient has previously received a Conservation officer, historic buildings.    All of the patient's questions and concerns have been addressed.    Thad Ranger   [facility] Specialty Pharmacist

## 2019-04-17 MED FILL — PROGRAF 0.5 MG CAPSULE: ORAL | 30 days supply | Qty: 120 | Fill #9

## 2019-04-17 MED FILL — MYFORTIC 180 MG TABLET,DELAYED RELEASE: 30 days supply | Qty: 120 | Fill #4

## 2019-04-17 MED FILL — MYFORTIC 180 MG TABLET,DELAYED RELEASE: 30 days supply | Qty: 120 | Fill #4 | Status: AC

## 2019-04-17 MED FILL — PROGRAF 0.5 MG CAPSULE: 30 days supply | Qty: 120 | Fill #9 | Status: AC

## 2019-04-24 ENCOUNTER — Ambulatory Visit: Admit: 2019-04-24 | Discharge: 2019-04-24 | Payer: MEDICARE

## 2019-04-24 DIAGNOSIS — Z940 Kidney transplant status: Principal | ICD-10-CM

## 2019-04-24 DIAGNOSIS — I10 Essential (primary) hypertension: Secondary | ICD-10-CM

## 2019-04-24 DIAGNOSIS — D899 Disorder involving the immune mechanism, unspecified: Secondary | ICD-10-CM

## 2019-04-24 DIAGNOSIS — N183 Chronic kidney disease, stage 3 (moderate): Principal | ICD-10-CM

## 2019-04-24 DIAGNOSIS — I2510 Atherosclerotic heart disease of native coronary artery without angina pectoris: Secondary | ICD-10-CM

## 2019-04-24 LAB — CBC W/ AUTO DIFF
BASOPHILS ABSOLUTE COUNT: 0 10*9/L (ref 0.0–0.1)
BASOPHILS RELATIVE PERCENT: 0.6 %
EOSINOPHILS ABSOLUTE COUNT: 0.1 10*9/L (ref 0.0–0.4)
EOSINOPHILS RELATIVE PERCENT: 1.7 %
HEMATOCRIT: 41.4 % (ref 36.0–46.0)
HEMOGLOBIN: 13.9 g/dL (ref 12.0–16.0)
LARGE UNSTAINED CELLS: 3 % (ref 0–4)
LYMPHOCYTES ABSOLUTE COUNT: 1.3 10*9/L — ABNORMAL LOW (ref 1.5–5.0)
LYMPHOCYTES RELATIVE PERCENT: 17.8 %
MEAN CORPUSCULAR HEMOGLOBIN CONC: 33.6 g/dL (ref 31.0–37.0)
MEAN CORPUSCULAR VOLUME: 84.2 fL (ref 80.0–100.0)
MEAN PLATELET VOLUME: 8.6 fL (ref 7.0–10.0)
MONOCYTES ABSOLUTE COUNT: 0.5 10*9/L (ref 0.2–0.8)
MONOCYTES RELATIVE PERCENT: 7.2 %
NEUTROPHILS ABSOLUTE COUNT: 5.3 10*9/L (ref 2.0–7.5)
NEUTROPHILS RELATIVE PERCENT: 69.8 %
RED BLOOD CELL COUNT: 4.92 10*12/L (ref 4.00–5.20)
RED CELL DISTRIBUTION WIDTH: 14.3 % (ref 12.0–15.0)
WBC ADJUSTED: 7.5 10*9/L (ref 4.5–11.0)

## 2019-04-24 LAB — COMPREHENSIVE METABOLIC PANEL
ALBUMIN: 4.5 g/dL (ref 3.5–5.0)
ALKALINE PHOSPHATASE: 73 U/L (ref 38–126)
ALT (SGPT): 15 U/L (ref <35)
ANION GAP: 18 mmol/L — ABNORMAL HIGH (ref 7–15)
AST (SGOT): 22 U/L (ref 14–38)
BILIRUBIN TOTAL: 0.7 mg/dL (ref 0.0–1.2)
BLOOD UREA NITROGEN: 25 mg/dL — ABNORMAL HIGH (ref 7–21)
BUN / CREAT RATIO: 19
CALCIUM: 10.8 mg/dL — ABNORMAL HIGH (ref 8.5–10.2)
CHLORIDE: 93 mmol/L — ABNORMAL LOW (ref 98–107)
CO2: 25 mmol/L (ref 22.0–30.0)
EGFR CKD-EPI AA FEMALE: 47 mL/min/{1.73_m2} — ABNORMAL LOW (ref >=60)
EGFR CKD-EPI NON-AA FEMALE: 41 mL/min/{1.73_m2} — ABNORMAL LOW (ref >=60)
GLUCOSE RANDOM: 97 mg/dL (ref 70–179)
POTASSIUM: 3.6 mmol/L (ref 3.5–5.0)
SODIUM: 136 mmol/L (ref 135–145)

## 2019-04-24 LAB — TACROLIMUS, TROUGH: Lab: 5.7

## 2019-04-24 LAB — GLUCOSE RANDOM: Glucose:MCnc:Pt:Ser/Plas:Qn:: 97

## 2019-04-24 LAB — LYMPHOCYTES RELATIVE PERCENT: Lab: 17.8

## 2019-04-24 NOTE — Unmapped (Signed)
Internal Medicine Clinic Visit    Reason for visit:     A/P:     Sophia Davis was seen today for follow-up.    Diagnoses and all orders for this visit:    Essential hypertension (RAF-HCC)  Slightly elevated upon arrival, but within goal on recheck and has been <130/80 with home checks. No changes to meds today. Educated that it is OK to take BP meds before labs.    CKD (chronic kidney disease) stage 3, GFR 30-59 ml/min (CMS-HCC)  - CMP, CBCd  Will send results to Dr. Toni Arthurs.    Coronary artery disease involving native heart without angina pectoris, unspecified vessel or lesion type  Stable, still no chest pain or abnormal shortness of breath. Will continue to monitor.    Immunosuppression (CMS-HCC)  - Tac level pending  Able to socially distance and wear a mask when she personally goes out, but lives with her son who declines to wear a mask when he is at work. Emphasized the importance of him wearing a mask in order to protect her. Counseled specifically on COVID return precautions.    __________________________________________________________    HPI:    73 y.o. female with here for follow-up. Since last visit, had colonoscopy with GI and saw orthopedics for trigger finger injection. Much improved    Blood pressures at home are usually around 120/70, with a max of 130/80. Did not take her blood pressure meds this morning before her lab work, and first read was 141/63. 126/66 on recheck.    No chest pain, shortness of breath except with exercise, but this is not anything new or worse. No esophageal discomfort or jaw pain with alendronate. Stays upright for at least 30 minutes after she takes it. Taking all medications without difficulty.    Walks around the block for exercise, which has become a little more stressful with Covid. Overall feeling well, tries not to dwell on anything negative.   __________________________________________________________      Medications:  See below. __________________________________________________________    Physical Exam:   Vital Signs:  Vitals:    04/24/19 0936 04/24/19 1052   BP: 141/63 126/66   BP Site:  L Arm   BP Position:  Sitting   Pulse: 73    Weight: 56.7 kg (125 lb)    Height: 154.9 cm (5' 1)      BP 126/66 on recheck    Gen: Well appearing, NAD  Eyes: EOMI  Pulm: No increased work of breathing  Ext: Normal gait, no edema       Your Medication List          Accurate as of April 24, 2019 10:54 AM. If you have any questions, ask your nurse or doctor.            CONTINUE taking these medications    alendronate 70 MG tablet  Commonly known as:  FOSAMAX  Take 1 tablet (70 mg total) by mouth every seven (7) days.     amLODIPine 5 MG tablet  Commonly known as:  NORVASC  Take 5 mg by mouth every evening.     aspirin 81 MG chewable tablet  Chew 81 mg daily.     atorvastatin 40 MG tablet  Commonly known as:  LIPITOR  TAKE 1 TABLET (40 MG TOTAL) BY MOUTH EVERY EVENING.     chlorthalidone 25 MG tablet  Commonly known as:  HYGROTON  TAKE 1 TABLET BY MOUTH IN THE MORNING     cholecalciferol 1,000  unit (25 mcg) tablet  Generic drug:  cholecalciferol (vitamin D3)  TAKE 1 CAPSULE BY MOUTH ONCE DAILY     famotidine 20 MG tablet  Commonly known as:  PEPCID  Take 1 tablet (20 mg total) by mouth two (2) times a day as needed for heartburn.     losartan 50 MG tablet  Commonly known as:  COZAAR  Take 1 tablet (50 mg total) by mouth two (2) times a day.     MYFORTIC 180 MG EC tablet  Generic drug:  mycophenolate  TAKE 2 TABLETS (360 MG TOTAL) BY MOUTH TWO (2) TIMES A DAY.     PROGRAF 0.5 MG capsule  Generic drug:  tacrolimus  Take 2 capsules (1 mg total) by mouth two (2) times a day.            The documentation recorded by the scribe accurately reflects the service I personally performed and the decisions made by me.  Dolores Hoose, MD

## 2019-05-02 ENCOUNTER — Ambulatory Visit: Admit: 2019-05-02 | Discharge: 2019-05-03 | Payer: MEDICARE

## 2019-05-02 DIAGNOSIS — Z940 Kidney transplant status: Principal | ICD-10-CM

## 2019-05-02 LAB — COMPREHENSIVE METABOLIC PANEL
ALBUMIN: 4.3 g/dL (ref 3.5–5.0)
ALT (SGPT): 15 U/L (ref <35)
AST (SGOT): 26 U/L (ref 14–38)
BILIRUBIN TOTAL: 1 mg/dL (ref 0.0–1.2)
BLOOD UREA NITROGEN: 15 mg/dL (ref 7–21)
BUN / CREAT RATIO: 15
CALCIUM: 9.1 mg/dL (ref 8.5–10.2)
CHLORIDE: 89 mmol/L — ABNORMAL LOW (ref 98–107)
CO2: 29 mmol/L (ref 22.0–30.0)
CREATININE: 1.01 mg/dL — ABNORMAL HIGH (ref 0.60–1.00)
EGFR CKD-EPI AA FEMALE: 63 mL/min/{1.73_m2} (ref >=60)
EGFR CKD-EPI NON-AA FEMALE: 55 mL/min/{1.73_m2} — ABNORMAL LOW (ref >=60)
GLUCOSE RANDOM: 93 mg/dL (ref 70–179)
POTASSIUM: 3.3 mmol/L — ABNORMAL LOW (ref 3.5–5.0)
PROTEIN TOTAL: 7.1 g/dL (ref 6.5–8.3)
SODIUM: 131 mmol/L — ABNORMAL LOW (ref 135–145)

## 2019-05-02 LAB — CBC W/ AUTO DIFF
BASOPHILS ABSOLUTE COUNT: 0 10*9/L (ref 0.0–0.1)
BASOPHILS RELATIVE PERCENT: 0.5 %
EOSINOPHILS ABSOLUTE COUNT: 0.1 10*9/L (ref 0.0–0.4)
HEMATOCRIT: 39.8 % (ref 36.0–46.0)
HEMOGLOBIN: 13.2 g/dL (ref 12.0–16.0)
LARGE UNSTAINED CELLS: 2 % (ref 0–4)
LYMPHOCYTES ABSOLUTE COUNT: 1.2 10*9/L — ABNORMAL LOW (ref 1.5–5.0)
LYMPHOCYTES RELATIVE PERCENT: 15.7 %
MEAN CORPUSCULAR HEMOGLOBIN CONC: 33.1 g/dL (ref 31.0–37.0)
MEAN CORPUSCULAR HEMOGLOBIN: 27.9 pg (ref 26.0–34.0)
MEAN CORPUSCULAR VOLUME: 84.2 fL (ref 80.0–100.0)
MEAN PLATELET VOLUME: 8.6 fL (ref 7.0–10.0)
MONOCYTES ABSOLUTE COUNT: 0.5 10*9/L (ref 0.2–0.8)
MONOCYTES RELATIVE PERCENT: 6.4 %
NEUTROPHILS ABSOLUTE COUNT: 5.5 10*9/L (ref 2.0–7.5)
PLATELET COUNT: 315 10*9/L (ref 150–440)
RED BLOOD CELL COUNT: 4.73 10*12/L (ref 4.00–5.20)
RED CELL DISTRIBUTION WIDTH: 14.4 % (ref 12.0–15.0)
WBC ADJUSTED: 7.3 10*9/L (ref 4.5–11.0)

## 2019-05-02 LAB — EGFR CKD-EPI AA FEMALE: Lab: 63

## 2019-05-02 LAB — WBC ADJUSTED: Lab: 7.3

## 2019-05-02 LAB — TACROLIMUS, TROUGH: Lab: 6.5

## 2019-05-02 MED ORDER — TRIAMTERENE 37.5 MG-HYDROCHLOROTHIAZIDE 25 MG CAPSULE
ORAL_CAPSULE | Freq: Every morning | ORAL | 11 refills | 30 days | Status: CP
Start: 2019-05-02 — End: 2020-05-01
  Filled 2019-05-15: qty 30, 30d supply, fill #0

## 2019-05-09 NOTE — Unmapped (Signed)
[facility] Pharm[facility] Refill Coordination Note    Specialty Medication(s) to be Shipped:   Transplant: Myfortic 180mg  and Prograf 0.5mg   Other medication(s) to be shipped: Atorvastatin 40mg  & Triamterene- HCTZ 37.5-25mg   **Pt going to out of town & would like for [place] to reach back out to her on 06/15/19 or 06/16/19**     Sophia Davis, DOB: [birth year]  Phone: [phone] (home)     All above HIPAA information was verified with patient.     Completed refill call assessment today to schedule patient's medication shipment from the [facility] [phone]).       Specialty medication(s) and dose(s) confirmed: Regimen is correct and unchanged.   Changes to medications: [name] reports no changes reported at this time.  Changes to insurance: No  Questions for the pharmacist: No    Confirmed patient received Welcome Packet with first shipment. The patient will receive a drug information handout for each medication shipped and additional FDA Medication Guides as required.       DISEASE/MEDICATION-SPECIFIC INFORMATION        N/A    SPECIALTY MEDICATION ADHERENCE     Medication Adherence    Patient reported X missed doses in the last month: 0  Specialty Medication: Myfortic 180mg   Patient is on additional specialty medications: Yes  Additional Specialty Medications: Prograf 0.5mg   Patient Reported Additional Medication X Missed Doses in the Last Month: 0  Patient is on more than two specialty medications: No  Adherence tools used: patient uses a pill box to manage medications        Myfortic 180 mg: 9 days of medicine on hand   Prograf 0.5 mg: 9 days of medicine on hand     SHIPPING     Shipping address confirmed in Epic.     Delivery Scheduled: Yes, Expected medication delivery date: 05/15/2019.     Medication will be delivered via Same Day Courier to the home address in [employer] [place].    Sophia Davis   [facility] Specialty Technician

## 2019-05-15 MED FILL — TRIAMTERENE 37.5 MG-HYDROCHLOROTHIAZIDE 25 MG CAPSULE: 30 days supply | Qty: 30 | Fill #0 | Status: AC

## 2019-05-15 MED FILL — PROGRAF 0.5 MG CAPSULE: ORAL | 30 days supply | Qty: 120 | Fill #10

## 2019-05-15 MED FILL — PROGRAF 0.5 MG CAPSULE: 30 days supply | Qty: 120 | Fill #10 | Status: AC

## 2019-05-15 MED FILL — ATORVASTATIN 40 MG TABLET: 90 days supply | Qty: 90 | Fill #2 | Status: AC

## 2019-05-15 MED FILL — ATORVASTATIN 40 MG TABLET: 90 days supply | Qty: 90 | Fill #2

## 2019-05-15 MED FILL — MYFORTIC 180 MG TABLET,DELAYED RELEASE: 30 days supply | Qty: 120 | Fill #5 | Status: AC

## 2019-05-15 MED FILL — MYFORTIC 180 MG TABLET,DELAYED RELEASE: 30 days supply | Qty: 120 | Fill #5

## 2019-06-12 NOTE — Unmapped (Signed)
[facility] Refill Coordination Note    Specialty Medication(s) to be Shipped:   Transplant: Myfortic 180mg  and Prograf 0.5mg     Other medication(s) to be shipped:   Vitamin D3  triamterene-hydrochlorothiazide (DYAZIDE) 37.5-25 mg        Sophia Davis, DOB: March 21, 1946  Phone: [phone] (home)       All above HIPAA information was verified with patient.     Completed refill call assessment today to schedule patient's medication shipment from the [facility] [phone]).       Specialty medication(s) and dose(s) confirmed: Regimen is correct and unchanged.   Changes to medications: Paisely reports no changes at this time.  Changes to insurance: No  Questions for the pharmacist: No    Confirmed patient received Welcome Packet with first shipment. The patient will receive a drug information handout for each medication shipped and additional FDA Medication Guides as required.       DISEASE/MEDICATION-SPECIFIC INFORMATION        N/A    SPECIALTY MEDICATION ADHERENCE     Medication Adherence    Patient reported X missed doses in the last month: 0  Adherence tools used: patient uses a pill box to manage medications              Myfortic 180 mg: 7 days of medicine on hand   Prograf 0.5 mg: 7 days of medicine on hand         SHIPPING     Shipping address confirmed in Epic.     Delivery Scheduled: Yes, Expected medication delivery date: 06/15/19.     Medication will be delivered via Same Day Courier to the home address in [place].    Sophia Davis   [facility] Specialty Technician

## 2019-06-15 MED FILL — PROGRAF 0.5 MG CAPSULE: ORAL | 30 days supply | Qty: 120 | Fill #11

## 2019-06-15 MED FILL — PROGRAF 0.5 MG CAPSULE: 30 days supply | Qty: 120 | Fill #11 | Status: AC

## 2019-06-15 MED FILL — MYFORTIC 180 MG TABLET,DELAYED RELEASE: 30 days supply | Qty: 120 | Fill #6 | Status: AC

## 2019-06-15 MED FILL — MYFORTIC 180 MG TABLET,DELAYED RELEASE: 30 days supply | Qty: 120 | Fill #6

## 2019-06-15 MED FILL — TRIAMTERENE 37.5 MG-HYDROCHLOROTHIAZIDE 25 MG CAPSULE: ORAL | 30 days supply | Qty: 30 | Fill #1

## 2019-06-15 MED FILL — TRIAMTERENE 37.5 MG-HYDROCHLOROTHIAZIDE 25 MG CAPSULE: 30 days supply | Qty: 30 | Fill #1 | Status: AC

## 2019-06-15 MED FILL — CHOLECALCIFEROL (VITAMIN D3) 25 MCG (1,000 UNIT) TABLET: 90 days supply | Qty: 90 | Fill #1 | Status: AC

## 2019-06-15 MED FILL — CHOLECALCIFEROL (VITAMIN D3) 25 MCG (1,000 UNIT) TABLET: ORAL | 90 days supply | Qty: 90 | Fill #1

## 2019-06-19 ENCOUNTER — Ambulatory Visit: Admit: 2019-06-19 | Discharge: 2019-06-20 | Payer: MEDICARE

## 2019-06-19 DIAGNOSIS — Z940 Kidney transplant status: Principal | ICD-10-CM

## 2019-06-19 DIAGNOSIS — Z79899 Other long term (current) drug therapy: Secondary | ICD-10-CM

## 2019-06-19 LAB — CBC W/ AUTO DIFF
BASOPHILS ABSOLUTE COUNT: 0 10*9/L (ref 0.0–0.1)
BASOPHILS RELATIVE PERCENT: 0.5 %
EOSINOPHILS RELATIVE PERCENT: 1.7 %
HEMATOCRIT: 39.5 % (ref 36.0–46.0)
HEMOGLOBIN: 12.5 g/dL (ref 12.0–16.0)
LARGE UNSTAINED CELLS: 2 % (ref 0–4)
LYMPHOCYTES ABSOLUTE COUNT: 1.3 10*9/L — ABNORMAL LOW (ref 1.5–5.0)
LYMPHOCYTES RELATIVE PERCENT: 20.2 %
MEAN CORPUSCULAR HEMOGLOBIN CONC: 31.5 g/dL (ref 31.0–37.0)
MEAN CORPUSCULAR HEMOGLOBIN: 27.3 pg (ref 26.0–34.0)
MEAN CORPUSCULAR VOLUME: 86.5 fL (ref 80.0–100.0)
MEAN PLATELET VOLUME: 8.4 fL (ref 7.0–10.0)
MONOCYTES ABSOLUTE COUNT: 0.5 10*9/L (ref 0.2–0.8)
MONOCYTES RELATIVE PERCENT: 7.9 %
NEUTROPHILS ABSOLUTE COUNT: 4.2 10*9/L (ref 2.0–7.5)
NEUTROPHILS RELATIVE PERCENT: 67.4 %
PLATELET COUNT: 322 10*9/L (ref 150–440)
RED BLOOD CELL COUNT: 4.57 10*12/L (ref 4.00–5.20)
WBC ADJUSTED: 6.3 10*9/L (ref 4.5–11.0)

## 2019-06-19 LAB — HDL CHOLESTEROL: Cholesterol.in HDL:MCnc:Pt:Ser/Plas:Qn:: 48

## 2019-06-19 LAB — TACROLIMUS, TROUGH: Lab: 5.2

## 2019-06-19 LAB — LIPID PANEL
CHOLESTEROL/HDL RATIO SCREEN: 2.7 (ref <5.0)
CHOLESTEROL: 130 mg/dL (ref 100–199)
HDL CHOLESTEROL: 48 mg/dL (ref 40–59)
LDL CHOLESTEROL CALCULATED: 61 mg/dL (ref 60–99)
NON-HDL CHOLESTEROL: 82 mg/dL
TRIGLYCERIDES: 103 mg/dL (ref 1–149)

## 2019-06-19 LAB — COMPREHENSIVE METABOLIC PANEL
ALBUMIN: 4.2 g/dL (ref 3.5–5.0)
ALKALINE PHOSPHATASE: 76 U/L (ref 38–126)
ALT (SGPT): 13 U/L (ref <35)
ANION GAP: 9 mmol/L (ref 7–15)
AST (SGOT): 19 U/L (ref 14–38)
BILIRUBIN TOTAL: 1.2 mg/dL (ref 0.0–1.2)
BLOOD UREA NITROGEN: 19 mg/dL (ref 7–21)
CALCIUM: 9.7 mg/dL (ref 8.5–10.2)
CHLORIDE: 95 mmol/L — ABNORMAL LOW (ref 98–107)
CO2: 28 mmol/L (ref 22.0–30.0)
CREATININE: 1.51 mg/dL — ABNORMAL HIGH (ref 0.60–1.00)
EGFR CKD-EPI AA FEMALE: 39 mL/min/{1.73_m2} — ABNORMAL LOW (ref >=60)
GLUCOSE RANDOM: 103 mg/dL — ABNORMAL HIGH (ref 70–99)
POTASSIUM: 4 mmol/L (ref 3.5–5.0)
PROTEIN TOTAL: 6.5 g/dL (ref 6.5–8.3)
SODIUM: 132 mmol/L — ABNORMAL LOW (ref 135–145)

## 2019-06-19 LAB — MAGNESIUM: Magnesium:MCnc:Pt:Ser/Plas:Qn:: 1.7

## 2019-06-19 LAB — HEMOGLOBIN A1C
HEMOGLOBIN A1C: 5.9 % — ABNORMAL HIGH (ref 4.8–5.6)
Hemoglobin A1c/Hemoglobin.total:MFr:Pt:Bld:Qn:: 5.9 — ABNORMAL HIGH

## 2019-06-19 LAB — ALKALINE PHOSPHATASE: Alkaline phosphatase:CCnc:Pt:Ser/Plas:Qn:: 76

## 2019-06-19 LAB — PARATHYROID HOMONE (PTH): PARATHYROID HORMONE INTACT: 57.1 pg/mL (ref 12.0–72.0)

## 2019-06-19 LAB — RED BLOOD CELL COUNT: Lab: 4.57

## 2019-06-19 LAB — PHOSPHORUS: Phosphate:MCnc:Pt:Ser/Plas:Qn:: 4.2

## 2019-06-19 LAB — TACROLIMUS LEVEL, TROUGH: TACROLIMUS, TROUGH: 5.2 ng/mL (ref 5.0–15.0)

## 2019-06-19 LAB — CALCIUM: Calcium:MCnc:Pt:Ser/Plas:Qn:: 9.7

## 2019-06-20 LAB — CMV DNA, QUANTITATIVE, PCR: CMV QUANT: <50 [IU]/mL — ABNORMAL HIGH (ref <0)

## 2019-06-20 LAB — CMV VIRAL LD: Lab: DETECTED — AB

## 2019-06-20 LAB — VITAMIN D, TOTAL (25OH): Lab: 42.6

## 2019-06-20 MED FILL — LOSARTAN 50 MG TABLET: 90 days supply | Qty: 180 | Fill #2 | Status: AC

## 2019-06-20 MED FILL — LOSARTAN 50 MG TABLET: ORAL | 90 days supply | Qty: 180 | Fill #2

## 2019-06-21 DIAGNOSIS — Z940 Kidney transplant status: Secondary | ICD-10-CM

## 2019-06-21 LAB — EBV VIRAL LOAD RESULT: Lab: NOT DETECTED

## 2019-06-22 LAB — VITAMIN D 1,25-DIHYDROXY: 1,25-Dihydroxyvitamin D:MCnc:Pt:Ser/Plas:Qn:: 34

## 2019-06-23 LAB — BK VIRUS QUANTITATIVE PCR, BLOOD

## 2019-06-23 LAB — HLA DS POST TRANSPLANT
ANTI-DONOR DRW #1 MFI: 816 MFI
ANTI-DONOR DRW #2 MFI: 109 MFI
ANTI-DONOR HLA-A #1 MFI: 12 MFI
ANTI-DONOR HLA-B #1 MFI: 3 MFI
ANTI-DONOR HLA-C #1 MFI: 0 MFI
ANTI-DONOR HLA-DQB #1 MFI: 0 MFI
ANTI-DONOR HLA-DQB #2 MFI: 18 MFI
ANTI-DONOR HLA-DR #1 MFI: 39 MFI
ANTI-DONOR HLA-DR #2 MFI: 71 MFI

## 2019-06-23 LAB — CLASS 2 ANTIBODIES IDENTIFIED

## 2019-06-23 LAB — HLA CLASS 1 ANTIBODY RESULT: Lab: POSITIVE

## 2019-06-23 LAB — FSAB CLASS 1 ANTIBODY SPECIFICITY

## 2019-06-23 LAB — FSAB CLASS 2 ANTIBODY SPECIFICITY: HLA CL2 AB RESULT: POSITIVE

## 2019-06-23 LAB — BK BLOOD RESULT: Lab: NOT DETECTED

## 2019-06-23 LAB — ANTI-DONOR DRW #1 MFI: Lab: 816

## 2019-06-27 ENCOUNTER — Ambulatory Visit: Admit: 2019-06-27 | Discharge: 2019-06-28 | Payer: MEDICARE

## 2019-06-27 DIAGNOSIS — Z940 Kidney transplant status: Secondary | ICD-10-CM

## 2019-06-27 LAB — CBC W/ AUTO DIFF
BASOPHILS ABSOLUTE COUNT: 0.1 10*9/L (ref 0.0–0.1)
BASOPHILS RELATIVE PERCENT: 0.9 %
EOSINOPHILS ABSOLUTE COUNT: 0.2 10*9/L (ref 0.0–0.4)
EOSINOPHILS RELATIVE PERCENT: 2.7 %
HEMOGLOBIN: 12.7 g/dL (ref 12.0–16.0)
LARGE UNSTAINED CELLS: 2 % (ref 0–4)
LYMPHOCYTES ABSOLUTE COUNT: 1.5 10*9/L (ref 1.5–5.0)
LYMPHOCYTES RELATIVE PERCENT: 22.1 %
MEAN CORPUSCULAR HEMOGLOBIN CONC: 32.5 g/dL (ref 31.0–37.0)
MEAN CORPUSCULAR VOLUME: 86.2 fL (ref 80.0–100.0)
MEAN PLATELET VOLUME: 7.6 fL (ref 7.0–10.0)
MONOCYTES ABSOLUTE COUNT: 0.4 10*9/L (ref 0.2–0.8)
MONOCYTES RELATIVE PERCENT: 6.6 %
NEUTROPHILS ABSOLUTE COUNT: 4.4 10*9/L (ref 2.0–7.5)
PLATELET COUNT: 322 10*9/L (ref 150–440)
RED BLOOD CELL COUNT: 4.53 10*12/L (ref 4.00–5.20)
RED CELL DISTRIBUTION WIDTH: 14.3 % (ref 12.0–15.0)
WBC ADJUSTED: 6.7 10*9/L (ref 4.5–11.0)

## 2019-06-27 LAB — ALBUMIN: Albumin:MCnc:Pt:Ser/Plas:Qn:: 4.1

## 2019-06-27 LAB — RENAL FUNCTION PANEL
ANION GAP: 10 mmol/L (ref 7–15)
BLOOD UREA NITROGEN: 21 mg/dL (ref 7–21)
BUN / CREAT RATIO: 17
CHLORIDE: 95 mmol/L — ABNORMAL LOW (ref 98–107)
CO2: 27 mmol/L (ref 22.0–30.0)
CREATININE: 1.23 mg/dL — ABNORMAL HIGH (ref 0.60–1.00)
EGFR CKD-EPI AA FEMALE: 50 mL/min/{1.73_m2} — ABNORMAL LOW (ref >=60)
EGFR CKD-EPI NON-AA FEMALE: 43 mL/min/{1.73_m2} — ABNORMAL LOW (ref >=60)
GLUCOSE RANDOM: 99 mg/dL (ref 70–99)
POTASSIUM: 4.7 mmol/L (ref 3.5–5.0)
SODIUM: 132 mmol/L — ABNORMAL LOW (ref 135–145)

## 2019-06-27 LAB — TACROLIMUS, TROUGH: Lab: 6.6

## 2019-06-27 LAB — MEAN CORPUSCULAR VOLUME: Lab: 86.2

## 2019-06-27 LAB — TACROLIMUS LEVEL, TROUGH: TACROLIMUS, TROUGH: 6.6 ng/mL (ref 5.0–15.0)

## 2019-06-27 LAB — MAGNESIUM: Magnesium:MCnc:Pt:Ser/Plas:Qn:: 1.6

## 2019-07-04 DIAGNOSIS — Z940 Kidney transplant status: Secondary | ICD-10-CM

## 2019-07-04 DIAGNOSIS — D899 Disorder involving the immune mechanism, unspecified: Secondary | ICD-10-CM

## 2019-07-04 NOTE — Unmapped (Signed)
[facility] Clinical Assessment & Refill Coordination Note    TEMIMA KUTSCH, DOB: [birth year]  Phone: [phone] (home)     All above HIPAA information was verified with patient.     Specialty Medication(s):   Transplant: Myfortic 180mg , Prograf 0.5mg  and tacrolimus 0.5mg        [facility] Pharmacist Intervention    Type of intervention: change from brand to generic of NTI drug    Medication: prograf    Problem: clinic ok'ed change to generic    Intervention: pt aware of change, need for labwork, and to contact clinic on date of switch. I will message clinic today as well    Follow up needed: n/a    Approximate time spent: 10 minutes    Thad Ranger   [facility] Specialty Pharmacist        Current Outpatient Medications   Medication Sig Dispense Refill   ??? alendronate (FOSAMAX) 70 MG tablet Take 1 tablet (70 mg total) by mouth every seven (7) days. 4 tablet 12   ??? amLODIPine (NORVASC) 5 MG tablet Take 5 mg by mouth every evening.     ??? aspirin 81 MG chewable tablet Chew 81 mg daily.     ??? atorvastatin (LIPITOR) 40 MG tablet TAKE 1 TABLET (40 MG TOTAL) BY MOUTH EVERY EVENING. 90 tablet 3   ??? cholecalciferol, vitamin D3, 1,000 unit (25 mcg) tablet TAKE 1 CAPSULE BY MOUTH ONCE DAILY 90 tablet 3   ??? famotidine (PEPCID) 20 MG tablet Take 1 tablet (20 mg total) by mouth two (2) times a day as needed for heartburn. 60 tablet 11   ??? losartan (COZAAR) 50 MG tablet Take 1 tablet (50 mg total) by mouth two (2) times a day. 180 tablet PRN   ??? MYFORTIC 180 mg EC tablet TAKE 2 TABLETS (360 MG TOTAL) BY MOUTH TWO (2) TIMES A DAY. 120 tablet PRN   ??? tacrolimus (PROGRAF) 0.5 MG capsule Take 2 capsules (1 mg total) by mouth two (2) times a day. 120 capsule 11   ??? triamterene-hydrochlorothiazide (DYAZIDE) 37.5-25 mg per capsule Take 1 capsule by mouth every morning. 30 capsule 11     No current facility-administered medications for this visit.         Changes to medications: Reverie reports no changes at this time.    No Known Allergies    Changes to allergies: No    SPECIALTY MEDICATION ADHERENCE     Myfortic 180mg   : 11 days of medicine on hand   Prograf 0.5mg   : 11 days of medicine on hand   Tacrolimus 0.5mg   : 0 days of medicine on hand - will switch on this fill    Medication Adherence    Patient reported X missed doses in the last month: 0  Specialty Medication: prograf 0.5mg   Patient is on additional specialty medications: Yes  Additional Specialty Medications: Myfortic 180mg   Patient Reported Additional Medication X Missed Doses in the Last Month: 0  Adherence tools used: patient uses a pill box to manage medications          Specialty medication(s) dose(s) confirmed: Regimen is correct and unchanged.     Are there any concerns with adherence? No    Adherence counseling provided? Not needed    CLINICAL MANAGEMENT AND INTERVENTION      Clinical Benefit Assessment:    Do you feel the medicine is effective or helping your  condition? Yes    Clinical Benefit counseling provided? Not needed    Adverse Effects Assessment:    Are you experiencing any side effects? No    Are you experiencing difficulty administering your medicine? No    Quality of Life Assessment:    How many days over the past month did your transplant  keep you from your normal activities? For example, brushing your teeth or getting up in the morning. 0    Have you discussed this with your provider? Not needed    Therapy Appropriateness:    Is therapy appropriate? Yes, therapy is appropriate and should be continued    DISEASE/MEDICATION-SPECIFIC INFORMATION      N/A    PATIENT SPECIFIC NEEDS     ? Does the patient have any physical, cognitive, or cultural barriers? No    ? Is the patient high risk? Yes, patient taking a REMS drug     ? Does the patient require a Care Management Plan? No     ? Does the patient require physician intervention or other additional services (i.e. nutrition, smoking cessation, social work)? No      SHIPPING     Specialty Medication(s) to be Shipped:   Transplant: Myfortic 180mg  and tacrolimus 0.5mg     Other medication(s) to be shipped: dyazide     Changes to insurance: No    Delivery Scheduled: Yes, Expected medication delivery date: 07/11/2019.  However, Rx request for refills was sent to the provider as there are none remaining.     Medication will be delivered via Next Day Courier to the confirmed home address in [facility].    The patient will receive a drug information handout for each medication shipped and additional FDA Medication Guides as required.  Verified that patient has previously received a Conservation officer, historic buildings.    All of the patient's questions and concerns have been addressed.    Thad Ranger   [facility] Specialty Pharmacist

## 2019-07-05 DIAGNOSIS — Z940 Kidney transplant status: Secondary | ICD-10-CM

## 2019-07-05 DIAGNOSIS — D899 Disorder involving the immune mechanism, unspecified: Secondary | ICD-10-CM

## 2019-07-05 MED ORDER — TRIAMTERENE 37.5 MG-HYDROCHLOROTHIAZIDE 25 MG CAPSULE: 1 | capsule | Freq: Every morning | 11 refills | 30 days | Status: AC

## 2019-07-05 MED ORDER — TACROLIMUS 0.5 MG CAPSULE: 1 mg | capsule | Freq: Two times a day (BID) | 11 refills | 30 days | Status: AC

## 2019-07-05 MED ORDER — TACROLIMUS 0.5 MG CAPSULE
ORAL_CAPSULE | Freq: Two times a day (BID) | ORAL | 11 refills | 30.00000 days | Status: CP
Start: 2019-07-05 — End: 2019-07-05
  Filled 2019-07-10: qty 120, 30d supply, fill #0

## 2019-07-05 MED ORDER — TRIAMTERENE 37.5 MG-HYDROCHLOROTHIAZIDE 25 MG CAPSULE
ORAL_CAPSULE | Freq: Every morning | ORAL | 11 refills | 30.00000 days | Status: CP
Start: 2019-07-05 — End: 2020-07-04
  Filled 2019-07-10: qty 30, 30d supply, fill #0

## 2019-07-10 MED FILL — MYFORTIC 180 MG TABLET,DELAYED RELEASE: 30 days supply | Qty: 120 | Fill #7

## 2019-07-10 MED FILL — MYFORTIC 180 MG TABLET,DELAYED RELEASE: 30 days supply | Qty: 120 | Fill #7 | Status: AC

## 2019-07-10 MED FILL — TACROLIMUS 0.5 MG CAPSULE: 30 days supply | Qty: 120 | Fill #0 | Status: AC

## 2019-07-10 MED FILL — TRIAMTERENE 37.5 MG-HYDROCHLOROTHIAZIDE 25 MG CAPSULE: 30 days supply | Qty: 30 | Fill #0 | Status: AC

## 2019-07-12 ENCOUNTER — Ambulatory Visit: Admit: 2019-07-12 | Discharge: 2019-07-12 | Payer: MEDICARE

## 2019-07-12 ENCOUNTER — Ambulatory Visit: Admit: 2019-07-12 | Discharge: 2019-07-12 | Payer: MEDICARE | Attending: Nephrology | Primary: Nephrology

## 2019-07-12 DIAGNOSIS — I10 Essential (primary) hypertension: Secondary | ICD-10-CM

## 2019-07-12 DIAGNOSIS — Z79899 Other long term (current) drug therapy: Secondary | ICD-10-CM

## 2019-07-12 DIAGNOSIS — Z23 Encounter for immunization: Secondary | ICD-10-CM

## 2019-07-12 DIAGNOSIS — D899 Disorder involving the immune mechanism, unspecified: Secondary | ICD-10-CM

## 2019-07-12 DIAGNOSIS — I2510 Atherosclerotic heart disease of native coronary artery without angina pectoris: Secondary | ICD-10-CM

## 2019-07-12 DIAGNOSIS — M810 Age-related osteoporosis without current pathological fracture: Secondary | ICD-10-CM

## 2019-07-12 DIAGNOSIS — Z940 Kidney transplant status: Secondary | ICD-10-CM

## 2019-07-12 DIAGNOSIS — N183 Chronic kidney disease, stage 3 (moderate): Secondary | ICD-10-CM

## 2019-07-12 DIAGNOSIS — E785 Hyperlipidemia, unspecified: Secondary | ICD-10-CM

## 2019-07-12 LAB — URINALYSIS
BACTERIA: NONE SEEN /HPF
BILIRUBIN UA: NEGATIVE
BLOOD UA: NEGATIVE
GLUCOSE UA: NEGATIVE
HYALINE CASTS: 8 /LPF — ABNORMAL HIGH (ref 0–1)
KETONES UA: NEGATIVE
NITRITE UA: NEGATIVE
PH UA: 5 (ref 5.0–9.0)
RBC UA: 1 /HPF (ref <=4)
SQUAMOUS EPITHELIAL: 1 /HPF (ref 0–5)
TRANSITIONAL EPITHELIAL: <1 /HPF (ref 0–2)
UROBILINOGEN UA: 0.2
WBC UA: 10 /HPF — ABNORMAL HIGH (ref 0–5)

## 2019-07-12 LAB — PROTEIN URINE: Protein:MCnc:Pt:Urine:Qn:: 7.6

## 2019-07-12 LAB — PROTEIN / CREATININE RATIO, URINE: CREATININE, URINE: 135.3 mg/dL

## 2019-07-12 LAB — BLOOD UA: Hemoglobin:PrThr:Pt:Urine:Ord:Test strip: NEGATIVE

## 2019-07-12 NOTE — Unmapped (Signed)
I spent 15 minutes in the room with the patient at her clinic visit.  I reviewed and updated her medications.  She had some confusion about her radiology studies for today.  I explained that we do a CXR and RUS on all of our patients annually and it was time to do it.  She will do the CXR today but is not able to do to the RUS today.  She would like it scheduled for 12/7 when she has another visit.

## 2019-07-16 NOTE — Unmapped (Signed)
[facility] [place] transplant nephrology clinic visit    assessment and plan  1. s/p kidney transplant 05/17/13. baseline creatinine 1-1.2 mg/dl. no proteinuria. no donor specific hla ab detected.   2. immunosuppression. mycophenolate sodium 360mg  bid. tacrolimus 12hr lvl 4-7 ng/ml.  3. hypertension. blood pressure goal < 130/80 mmhg.  4. health maintenance. influenza '20. pcv13 pneumococcal '14. ppsv23 pneumococcal '16. zoster '19. mammogram '19. gyn exam '13/post hysterectomy. cystoscopy '14. colonoscopy '20 +LSIL; nl exam 6/20 with repeat in 43m recommended. dermatology appt '19. renal ultrasound '19; she is unable to stay for scheduled follow up ultrasound 9/23; to be rescheduled.    history of present illness    [name] is a 73 year old woman seen in follow up post kidney transplant 05/17/2013. she feels well. no fever chills or sweats. no headache or lightheaded. no chest pain shortness of breath or cough. no edema. no abdominal pain no n/v/d. no dysuria hematuria or difficulty voiding. all other systems reviewed and negative x10 systems.    past medical hx:   1. s/p deceased donor kidney transplant 05/17/2013. mpo-anca+ chronic gn. alemtuzumab induction. baseline creatinine 1-1.2 mg/dl.  2. mpo-anca+ vasculitis/granulomatosis with polyangiitis. hx pulmonary hemorrhage. trtmt plasma exchange, iv methylprednisolone, iv/po cyclophosphamide x total 41m '03-'07; rituximab 1gm x2 '07.  3. hypertension   4. coronary artery disease s/p pci/stent '97. nl cards stress '13: lvef > 65%. no myocardial ischemia   5. hyperlipidemia  6. osteoporosis  7. secondary/tertiary hyperparathyroidism    past surgical hx: total abdominal hysterectomy '98. 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 kidney transplant '14. right hand 3rd/4th trigger finger release '16.    allergies: nkda     medications: tacrolimus 1mg  bid, mycophenolate sodium 360mg  bid, losartan 50mg  bid, amlodipine 5mg  q.pm, triamterene/hydrochlorothiazide 37.5/25mg  daily, atorvastatin 40mg  daily, aspirin 81mg  daily, vitamin d3 1,000u daily, famotidine 20mg  prn, alendronate 70mg  q.wk.    soc hx: widowed x3 children. distant smoking hx; none > 40 yrs.    physical exam: t97.5 p60 bp112/68 wt57.2kg bmi23.8. wd/wn woman nl/appropriate affect and mood. nl sclera anicteric. mmm no thrush +denture plates. neck supple no palpable ln. heart rrr nl s1s2 no m/r/g. lungs clear bilateral. abdomen soft nt/nd. no lower ext edema. msk no synovitis or tophi. skin no rash. neuro alert oriented non focal exam.    labs 06/27/19: wbc6.7 hgb12.7 hct39 plt322. na132 k4.7 cl95 bicarb27 bun21 cr1.2 glc99 ca9.3 mg1.6 phos3.2 albumin4.1. tacrolimus lvl 6.6 ng/ml. urine protein/cr 0.056.

## 2019-08-01 NOTE — Unmapped (Signed)
[facility] Refill Coordination Note    Specialty Medication(s) to be Shipped:   Transplant: Myfortic 180mg  and tacrolimus 0.5mg     Other medication(s) to be shipped: atorvastatin 40mg  and triam/hctz     Sophia Davis, DOB: [birth year]  Phone: [phone] (home)       All above HIPAA information was verified with patient.     Completed refill call assessment today to schedule patient's medication shipment from the [facility] [phone]).       Specialty medication(s) and dose(s) confirmed: Regimen is correct and unchanged.   Changes to medications: Sophia Davis reports no changes at this time.  Changes to insurance: No  Questions for the pharmacist: No    Confirmed patient received Welcome Packet with first shipment. The patient will receive a drug information handout for each medication shipped and additional FDA Medication Guides as required.       DISEASE/MEDICATION-SPECIFIC INFORMATION        N/A    SPECIALTY MEDICATION ADHERENCE     Medication Adherence    Patient reported X missed doses in the last month: 0  Specialty Medication: Myfortic 180mg   Patient is on additional specialty medications: Yes  Additional Specialty Medications: Tacrolimus 0.5mg   Patient Reported Additional Medication X Missed Doses in the Last Month: 0  Patient is on more than two specialty medications: No  Adherence tools used: patient uses a pill box to manage medications          Myfortic 180 mg: 9 days of medicine on hand   Tacrolimus 0.5 mg: 9 days of medicine on hand     SHIPPING     Shipping address confirmed in Epic.     Delivery Scheduled: Yes, Expected medication delivery date: 08/08/19.     Medication will be delivered via Next Day Courier to the home address in Epic [place].    [name]   [facility] Specialty Technician

## 2019-08-07 MED FILL — MYFORTIC 180 MG TABLET,DELAYED RELEASE: 30 days supply | Qty: 120 | Fill #8 | Status: AC

## 2019-08-07 MED FILL — ATORVASTATIN 40 MG TABLET: 90 days supply | Qty: 90 | Fill #3

## 2019-08-07 MED FILL — TACROLIMUS 0.5 MG CAPSULE: 30 days supply | Qty: 120 | Fill #1 | Status: AC

## 2019-08-07 MED FILL — ATORVASTATIN 40 MG TABLET: 90 days supply | Qty: 90 | Fill #3 | Status: AC

## 2019-08-07 MED FILL — TACROLIMUS 0.5 MG CAPSULE, IMMEDIATE-RELEASE: ORAL | 30 days supply | Qty: 120 | Fill #1

## 2019-08-07 MED FILL — TRIAMTERENE 37.5 MG-HYDROCHLOROTHIAZIDE 25 MG CAPSULE: ORAL | 30 days supply | Qty: 30 | Fill #1

## 2019-08-07 MED FILL — TRIAMTERENE 37.5 MG-HYDROCHLOROTHIAZIDE 25 MG CAPSULE: 30 days supply | Qty: 30 | Fill #1 | Status: AC

## 2019-08-07 MED FILL — MYFORTIC 180 MG TABLET,DELAYED RELEASE: 30 days supply | Qty: 120 | Fill #8

## 2019-08-24 ENCOUNTER — Ambulatory Visit: Admit: 2019-08-24 | Discharge: 2019-08-24 | Payer: MEDICARE

## 2019-08-29 NOTE — Unmapped (Signed)
[facility] Refill Coordination Note    Specialty Medication(s) to be Shipped:   Transplant: Myfortic 180mg  and tacrolimus 0.5mg     Other medication(s) to be shipped:   DYAZIDE  Vitamin D3     Sophia Davis, DOB: 02-Oct-1946  Phone: [phone] (home)       All above HIPAA information was verified with patient.     Completed refill call assessment today to schedule patient's medication shipment from the [facility] [phone]).       Specialty medication(s) and dose(s) confirmed: Regimen is correct and unchanged.   Changes to medications: Sophia Davis reports no changes at this time.  Changes to insurance: No  Questions for the pharmacist: No    Confirmed patient received Welcome Packet with first shipment. The patient will receive a drug information handout for each medication shipped and additional FDA Medication Guides as required.       DISEASE/MEDICATION-SPECIFIC INFORMATION        N/A    SPECIALTY MEDICATION ADHERENCE     Medication Adherence    Patient reported X missed doses in the last month: 0  Adherence tools used: patient uses a pill box to manage medications            Myfortic 180mg : 10 days worth of medication on hand.  Tacrolimus 0.5mg : 10 days worth of medication on hand.        SHIPPING     Shipping address confirmed in Epic.     Delivery Scheduled: Yes, Expected medication delivery date: 09/01/19.     Medication will be delivered via UPS to the home address in Epic [place].    Sophia Davis   [facility] Specialty Technician

## 2019-08-31 MED FILL — TACROLIMUS 0.5 MG CAPSULE, IMMEDIATE-RELEASE: ORAL | 30 days supply | Qty: 120 | Fill #2

## 2019-08-31 MED FILL — MYFORTIC 180 MG TABLET,DELAYED RELEASE: 30 days supply | Qty: 120 | Fill #9

## 2019-08-31 MED FILL — CHOLECALCIFEROL (VITAMIN D3) 25 MCG (1,000 UNIT) TABLET: 90 days supply | Qty: 90 | Fill #2 | Status: AC

## 2019-08-31 MED FILL — MYFORTIC 180 MG TABLET,DELAYED RELEASE: 30 days supply | Qty: 120 | Fill #9 | Status: AC

## 2019-08-31 MED FILL — TACROLIMUS 0.5 MG CAPSULE: 30 days supply | Qty: 120 | Fill #2 | Status: AC

## 2019-08-31 MED FILL — TRIAMTERENE 37.5 MG-HYDROCHLOROTHIAZIDE 25 MG CAPSULE: 30 days supply | Qty: 30 | Fill #2 | Status: AC

## 2019-08-31 MED FILL — CHOLECALCIFEROL (VITAMIN D3) 25 MCG (1,000 UNIT) TABLET: ORAL | 90 days supply | Qty: 90 | Fill #2

## 2019-08-31 MED FILL — TRIAMTERENE 37.5 MG-HYDROCHLOROTHIAZIDE 25 MG CAPSULE: ORAL | 30 days supply | Qty: 30 | Fill #2

## 2019-09-25 ENCOUNTER — Institutional Professional Consult (permissible substitution): Admit: 2019-09-25 | Discharge: 2019-09-26 | Payer: MEDICARE

## 2019-09-25 ENCOUNTER — Ambulatory Visit: Admit: 2019-09-25 | Discharge: 2019-09-26 | Payer: MEDICARE

## 2019-09-25 LAB — RENAL FUNCTION PANEL
ALBUMIN: 3.9 g/dL (ref 3.5–5.0)
ANION GAP: 9 mmol/L (ref 7–15)
BLOOD UREA NITROGEN: 16 mg/dL (ref 7–21)
BUN / CREAT RATIO: 14
CALCIUM: 9.2 mg/dL (ref 8.5–10.2)
CHLORIDE: 97 mmol/L — ABNORMAL LOW (ref 98–107)
CO2: 26 mmol/L (ref 22.0–30.0)
CREATININE: 1.12 mg/dL — ABNORMAL HIGH (ref 0.60–1.00)
EGFR CKD-EPI AA FEMALE: 56 mL/min/{1.73_m2} — ABNORMAL LOW (ref >=60)
EGFR CKD-EPI NON-AA FEMALE: 49 mL/min/{1.73_m2} — ABNORMAL LOW (ref >=60)
GLUCOSE RANDOM: 101 mg/dL (ref 70–179)
SODIUM: 132 mmol/L — ABNORMAL LOW (ref 135–145)

## 2019-09-25 LAB — CBC W/ AUTO DIFF
BASOPHILS ABSOLUTE COUNT: 0 10*9/L (ref 0.0–0.1)
EOSINOPHILS ABSOLUTE COUNT: 0.1 10*9/L (ref 0.0–0.4)
EOSINOPHILS RELATIVE PERCENT: 2 %
HEMATOCRIT: 36.4 % (ref 36.0–46.0)
HEMOGLOBIN: 11.7 g/dL — ABNORMAL LOW (ref 12.0–16.0)
LARGE UNSTAINED CELLS: 2 % (ref 0–4)
LYMPHOCYTES ABSOLUTE COUNT: 1.2 10*9/L — ABNORMAL LOW (ref 1.5–5.0)
LYMPHOCYTES RELATIVE PERCENT: 22.3 %
MEAN CORPUSCULAR HEMOGLOBIN CONC: 32.2 g/dL (ref 31.0–37.0)
MEAN CORPUSCULAR HEMOGLOBIN: 27.9 pg (ref 26.0–34.0)
MEAN CORPUSCULAR VOLUME: 86.6 fL (ref 80.0–100.0)
MEAN PLATELET VOLUME: 9.5 fL (ref 7.0–10.0)
MONOCYTES ABSOLUTE COUNT: 0.4 10*9/L (ref 0.2–0.8)
MONOCYTES RELATIVE PERCENT: 8 %
NEUTROPHILS ABSOLUTE COUNT: 3.4 10*9/L (ref 2.0–7.5)
NEUTROPHILS RELATIVE PERCENT: 65.4 %
PLATELET COUNT: 276 10*9/L (ref 150–440)
RED BLOOD CELL COUNT: 4.2 10*12/L (ref 4.00–5.20)
RED CELL DISTRIBUTION WIDTH: 14.1 % (ref 12.0–15.0)
WBC ADJUSTED: 5.2 10*9/L (ref 4.5–11.0)

## 2019-09-25 LAB — MAGNESIUM: Magnesium:MCnc:Pt:Ser/Plas:Qn:: 1.4 — ABNORMAL LOW

## 2019-09-25 LAB — NEUTROPHILS ABSOLUTE COUNT: Neutrophils:NCnc:Pt:Bld:Qn:Automated count: 3.4

## 2019-09-25 LAB — POTASSIUM: Potassium:SCnc:Pt:Ser/Plas:Qn:: 4.1

## 2019-09-25 NOTE — Unmapped (Signed)
See dermatology for left arm and face.

## 2019-09-25 NOTE — Unmapped (Signed)
[facility] BPs  BP#1 133/61   BP#2 135/60   BP#3 119/55    Average BP 129/60  (please note this as a comment in vitals)

## 2019-09-27 NOTE — Unmapped (Signed)
[facility] Refill Coordination Note    Specialty Medication(s) to be Shipped:   Transplant: Myfortic 180mg  and tacrolimus 0.5mg     Other medication(s) to be shipped: triamterene-hydrochlorothiazide 37.5-25 mg per capsule and losartan 50mg      Sophia Davis, DOB: 12-30-1945  Phone: [phone] (home)       All above HIPAA information was verified with patient.     Was a Nurse, learning disability used for this call? No    Completed refill call assessment today to schedule patient's medication shipment from the [facility] [phone]).       Specialty medication(s) and dose(s) confirmed: Regimen is correct and unchanged.   Changes to medications: Mozel reports no changes at this time.  Changes to insurance: No  Questions for the pharmacist: No    Confirmed patient received Welcome Packet with first shipment. The patient will receive a drug information handout for each medication shipped and additional FDA Medication Guides as required.       DISEASE/MEDICATION-SPECIFIC INFORMATION        N/A    SPECIALTY MEDICATION ADHERENCE     Medication Adherence    Patient reported X missed doses in the last month: 0  Specialty Medication: Myfortic 180mg   Patient is on additional specialty medications: Yes  Additional Specialty Medications: Tacrolimus 0.5mg   Patient Reported Additional Medication X Missed Doses in the Last Month: 0  Patient is on more than two specialty medications: No  Adherence tools used: patient uses a pill box to manage medications          Myfortic 180 mg: 7 days of medicine on hand   Tacrolimus 0.5 mg: 7 days of medicine on hand     SHIPPING     Shipping address confirmed in Epic.     Delivery Scheduled: Yes, Expected medication delivery date: 10/02/2019.     Medication will be delivered via Same Day Courier to the prescription address in Epic [place].    Sophia Davis   [facility] Specialty Technician

## 2019-09-27 NOTE — Unmapped (Signed)
Internal Medicine Clinic Visit    Reason for visit: Follow-up visit    A/P:        Sophia Davis was seen today for follow-up.    Diagnoses and all orders for this visit:    Pleural effusion  -     XR Chest 2 views; Future  Patient had a chest x-ray done by the transplant folks.  An x-ray showed a very small pleural effusion.  I am ordering another chest x-ray for her to get when she comes back to evaluate if that effusion has progressed at all.  Skin nodule  -     Ambulatory referral to Dermatology; Future  She has a small nodule on the left arm.  It is clearly irritated and she is sore around it.  I am recommending that she have this evaluated by dermatology.  It could be an inflamed seborrheic keratosis but I am concerned about other etiologies as well.  Kidney transplant 05/17/2013  Kidney function is doing well.  Essential hypertension (RAF-HCC)  Blood pressure is well controlled no changes in therapy.        __________________________________________________________    HPI:    73 y.o. female here for follow-up evaluation.  She is overall doing well and feeling well.  She is able to do what she likes to do.    She notes that she has a skin nodule on her left forearm that she continues to bump and is very sore.  The nodule itself is not grown or changed.    She asked about her chest x-ray that was ordered by the transplant team and we reviewed that together.  __________________________________________________________      Medications:  See below.    __________________________________________________________    Physical Exam:   Vital Signs:  Vitals:    09/25/19 0932   BP: 129/60   Pulse: 62   Weight: 57.2 kg (126 lb)   Height: 154.9 cm (5' 1)       Gen: Well appearing, NAD  CV: RRR, no murmurs  Pulm: CTA bilaterally, no crackles or wheezes  Pulses: 2+ radial Ext: No edema.  Left arm: Approximately 1 cm slightly raised nodule.  It has some appearance like a seborrheic keratosis but appears more raised than usual and somewhat inflamed.  She is very tender to palpation just proximal to the lesion on her forearm.         Your Medication List          Accurate as of September 25, 2019 11:59 PM. If you have any questions, ask your nurse or doctor.            CONTINUE taking these medications    alendronate 70 MG tablet  Commonly known as: FOSAMAX  Take 1 tablet (70 mg total) by mouth every seven (7) days.     amLODIPine 5 MG tablet  Commonly known as: NORVASC  Take 5 mg by mouth every evening.     aspirin 81 MG chewable tablet  Chew 81 mg daily.     atorvastatin 40 MG tablet  Commonly known as: LIPITOR  TAKE 1 TABLET (40 MG TOTAL) BY MOUTH EVERY EVENING.     cholecalciferol (vitamin D3) 1,000 unit (25 mcg) tablet  TAKE 1 CAPSULE BY MOUTH ONCE DAILY     famotidine 20 MG tablet  Commonly known as: PEPCID  Take 1 tablet (20 mg total) by mouth two (2) times a day as needed for heartburn.     losartan  50 MG tablet  Commonly known as: COZAAR  Take 1 tablet (50 mg total) by mouth two (2) times a day.     MYFORTIC 180 MG EC tablet  Generic drug: mycophenolate  TAKE 2 TABLETS (360 MG TOTAL) BY MOUTH TWO (2) TIMES A DAY.     tacrolimus 0.5 MG capsule  Commonly known as: PROGRAF  Take 2 capsules (1 mg total) by mouth two (2) times a day.     triamterene-hydrochlorothiazide 37.5-25 mg per capsule  Commonly known as: DYAZIDE  Take 1 capsule by mouth every morning.

## 2019-10-02 MED FILL — MYFORTIC 180 MG TABLET,DELAYED RELEASE: 30 days supply | Qty: 120 | Fill #10 | Status: AC

## 2019-10-02 MED FILL — TRIAMTERENE 37.5 MG-HYDROCHLOROTHIAZIDE 25 MG CAPSULE: 30 days supply | Qty: 30 | Fill #3 | Status: AC

## 2019-10-02 MED FILL — LOSARTAN 50 MG TABLET: 90 days supply | Qty: 180 | Fill #3 | Status: AC

## 2019-10-02 MED FILL — LOSARTAN 50 MG TABLET: ORAL | 90 days supply | Qty: 180 | Fill #3

## 2019-10-02 MED FILL — TACROLIMUS 0.5 MG CAPSULE, IMMEDIATE-RELEASE: ORAL | 30 days supply | Qty: 120 | Fill #3

## 2019-10-02 MED FILL — TACROLIMUS 0.5 MG CAPSULE: 30 days supply | Qty: 120 | Fill #3 | Status: AC

## 2019-10-02 MED FILL — MYFORTIC 180 MG TABLET,DELAYED RELEASE: 30 days supply | Qty: 120 | Fill #10

## 2019-10-02 MED FILL — TRIAMTERENE 37.5 MG-HYDROCHLOROTHIAZIDE 25 MG CAPSULE: ORAL | 30 days supply | Qty: 30 | Fill #3

## 2019-10-06 ENCOUNTER — Ambulatory Visit: Admit: 2019-10-06 | Discharge: 2019-10-07 | Payer: MEDICARE

## 2019-10-06 DIAGNOSIS — D485 Neoplasm of uncertain behavior of skin: Principal | ICD-10-CM

## 2019-10-06 DIAGNOSIS — L820 Inflamed seborrheic keratosis: Principal | ICD-10-CM

## 2019-10-06 NOTE — Unmapped (Signed)
Shave biopsy   A shave biopsy involves numbing a small area of your skin and then obtaining a sample to help [place] with proper diagnosis or skin condition. Biopsy results typically return in 7 to 14 days.    To care for the area: Leave the bandage in place until the morning after your procedure is performed. On a daily basis, carefully remove the bandage, then shower or wash as usual. Allow water to run over the site. Please do not scrub. Carefully dry the area, then apply ointment (some people develop an allergy to Neosporin, so we recommend Vaseline orAquaphor). Cover the site with a fresh bandage. Should any bleeding occur, apply firm pressure for 15 minutes. The treated site will heal best if  a scab never forms (the wound heals by new skin cells traveling from the outside toward the middle-their journey is easier if no scab stands in their way).    Long-term care: the site will be more sensitive than your surrounding skin. Keep it covered, and remember to apply sunscreen every day to all your exposed skin. A scar may remain which is lighter or pinker than your normal skin. Your body will continue to improve your scar for up to one year.    Infection following this procedure is rare. However, if you are worried about the appearance of your site, contact your doctor. Complete healing may take up to one month. We have a physician on call at all times. If you have any concerns about the site, please call our clinic at [phone]       Cryosurgery  Cryosurgery (???freezing???) uses liquid nitrogen to destroy certain types of skin lesions. Lowering the temperature of the lesion in a small area surrounding skin destroys the lesion. Immediately following cryosurgery, you will notice redness and swelling of the treatment area. Blistering or weeping may occur, lasting approximately one week which will then be followed by crusting. Most areas will heal completely in 10 to 14 days.    Wash the treated areas daily. Allow soap and water to run over the areas, but do not scrub. Should a scab or crust form, allow it to fall off on its own. Do not remove or pick at it. Application of an ointment  and a bandage may make you feel more comfortable, but it is not necessary. Some people develop an allergy to Neosporin, so we recommend that Vaseline or  Aquaphor be used.    The cryotherapy site will be more sensitive than your surrounding skin. Keep it covered, and remember to apply sunscreen every day to all your sun exposed skin. A scar may remain which is lighter or pinker than your normal skin. Your body will continue to improve your scar for up to one year; however a light-colored scar may remain.    Infection following cryotherapy is rare. However if you are worried about the appearance of the treated area, contact your doctor. We have a physician on call at all times. If you have any concerns about the site, please call our clinic at [phone]

## 2019-10-06 NOTE — Unmapped (Signed)
??  Dermatology Clinic Outpatient     ??  Assessment and Plan:   ??  Neoplasm NOS:  To confirm diagnosis, biopsy obtained today.  - Site: L forearm DDx: SCC vs iSK vs other   - Prior to the procedure, risks, benefits, and alternatives were discussed and verbal informed consent was obtained.  - Biopsy of lesion(s) in question  was performed using the shave technique with a Dermablade tool following alcohol prep and anesthesia with 2% lidocaine with epinephrine. Hemostasis was obtained in the usual fashion with Aluminum chloride, pressure, and/or electrocautery. Area was dressed with petrolatum and bandage.  Wound care instructions given.   - We will contact the patient with results when available.     Seborrheic keratosis, inflamed x 1 on the L cheek (lesion of concern)  - Liquid Nitrogen (LN2) X 1 cycle X 10 seconds each after risks/benefits & wound  care discussed (please see procedure note for further details)  - Reassurance, benign, monitor    Procedure note:  After discussion of risks, benefits, alternatives, including but not limited to scar, post-inflammatory pigment changes, infection, blister, pain, recurrence, and necessity of further treatment, patient agreed to treatment and verbal consent was obtained. Applied liquid nitrogen x 5-10 seconds for 2  freeze-thaw cycle to 1 lesions.  The  patient tolerated the procedure well and was instructed on post-procedure care.    Return to clinic: Return in about 6 months (around 04/05/2020) for skin check.    ??  Subjective:   ??  Chief Complaint:   Chief Complaint   Patient presents with   ??? Lesion Of Concern     spot on face that has been frozen twice and a piece of it has returned. Sore spot on left arm that hurts when she hits it on things that's been there for a while. States it's abou the size of a pea. Has been applying Benadryl and it helps the soreness a little, but it's still sore HPI: This is a pleasant 73 y.o. female who was last seen by Dr. Maryjean Ka 08/2018 who presents today for lesions of concern. Lesions of concern today are as follows:  - spot on the forearm x one year. The lesion is tender. The lesion is not itching, bleeding or growing rapidly. No previous testing or treatment tried other than topical Benadryl.   - she had in inflamed SK on the L cheek treated with LN2 at LV. This improved lesion, however part of it regrew. This is bothersome for patient so she would like to have this retreated.    She denies any other new, changing, bleeding or worrisome skin lesions. She has no other skin concerns.    Pertinent Past Medical History:   No history of skin cancer  CKD stage IV on immunosuppression s/p kidney transplant, on Cellcept and Prograf  Granulomatosis with polyangiitis      Family  History:   No family history of melanoma or other skin cancer    Social History:   Baseline state of health. No fevers, chills, headaches, joint pains or other skin concerns.     Review of Systems:  Baseline state of health. No fevers, chills, headaches, joint pains or other skin concerns.     Objective:   ??  Physical Examination:  General: Well-developed, well-nourished female in no acute distress, resting comfortably.  Neuro: A&Ox 3. Answers questions appropriately.  Psych: Mood and affect appropriate for age  Skin: Examination including inspection and palpation of the face,  neck, chest, bilateral upper extremities  was performed and notable for the following:  - 1 cm hyperkeratotic scaly plaque on L forearm  - Hyperkeratotic, well-demarcated, stuck-on brown plaque which appears inflamed on the left cheek (lesion of concern)  - All other areas not specifically commented on are within normal limits.        The patient was seen and examined by Dr. Orma Flaming who agrees with the assessment and plan as above.

## 2019-10-12 NOTE — Unmapped (Signed)
Called patient with biopsy result noted below. Discussed management options, including excision vs ED&C. Patient elects to proceed with [facility] for further treatment.     Robin,     Could we please schedule this patient for [facility] of SCCis on L forearm. Codes below.     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

## 2019-10-12 NOTE — Unmapped (Signed)
Attempted to call patient with biopsy result noted below but unable to reachx1. Left VM. Will attempt to contact again at later time.    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

## 2019-10-26 NOTE — Unmapped (Signed)
[facility] Refill Coordination Note    Specialty Medication(s) to be Shipped:   Transplant: Myfortic 180mg  and tacrolimus 0.5mg     Other medication(s) to be shipped:   Atorvastatin  Dyazide     Sophia Davis, DOB: [birth year]  Phone: [phone] (home)       All above HIPAA information was verified with patient.     Was a Nurse, learning disability used for this call? No    Completed refill call assessment today to schedule patient's medication shipment from the [facility] [phone]).       Specialty medication(s) and dose(s) confirmed: Regimen is correct and unchanged.   Changes to medications: Ferrell reports no changes at this time.  Changes to insurance: No  Questions for the pharmacist: No    Confirmed patient received Welcome Packet with first shipment. The patient will receive a drug information handout for each medication shipped and additional FDA Medication Guides as required.       DISEASE/MEDICATION-SPECIFIC INFORMATION        N/A    SPECIALTY MEDICATION ADHERENCE     Medication Adherence    Patient reported X missed doses in the last month: 0  Adherence tools used: patient uses a pill box to manage medications        Myfortic 180mg : 10 days worth of medication on hand.   Tacrolimus 0.5mg : 10 days worth of medication on hand.               SHIPPING     Shipping address confirmed in Epic.     Delivery Scheduled: Yes, Expected medication delivery date: 11/01/19.     Medication will be delivered via UPS to the prescription address in Epic [place].    Sophia Davis   [facility] Specialty Technician

## 2019-10-31 DIAGNOSIS — Z1231 Encounter for screening mammogram for malignant neoplasm of breast: Principal | ICD-10-CM

## 2019-10-31 MED ORDER — ATORVASTATIN 40 MG TABLET
ORAL_TABLET | Freq: Every evening | ORAL | 3 refills | 90.00000 days | Status: CP
Start: 2019-10-31 — End: 2020-10-30
  Filled 2019-11-01: qty 90, 90d supply, fill #0

## 2019-11-01 MED FILL — MYFORTIC 180 MG TABLET,DELAYED RELEASE: 30 days supply | Qty: 120 | Fill #11

## 2019-11-01 MED FILL — TACROLIMUS 0.5 MG CAPSULE, IMMEDIATE-RELEASE: ORAL | 30 days supply | Qty: 120 | Fill #4

## 2019-11-01 MED FILL — TRIAMTERENE 37.5 MG-HYDROCHLOROTHIAZIDE 25 MG CAPSULE: ORAL | 30 days supply | Qty: 30 | Fill #4

## 2019-11-01 MED FILL — TACROLIMUS 0.5 MG CAPSULE: 30 days supply | Qty: 120 | Fill #4 | Status: AC

## 2019-11-01 MED FILL — TRIAMTERENE 37.5 MG-HYDROCHLOROTHIAZIDE 25 MG CAPSULE: 30 days supply | Qty: 30 | Fill #4 | Status: AC

## 2019-11-01 MED FILL — MYFORTIC 180 MG TABLET,DELAYED RELEASE: 30 days supply | Qty: 120 | Fill #11 | Status: AC

## 2019-11-01 MED FILL — ATORVASTATIN 40 MG TABLET: 90 days supply | Qty: 90 | Fill #0 | Status: AC

## 2019-11-09 ENCOUNTER — Ambulatory Visit: Admit: 2019-11-09 | Discharge: 2019-11-10 | Payer: MEDICARE

## 2019-11-09 DIAGNOSIS — D0462 Carcinoma in situ of skin of left upper limb, including shoulder: Principal | ICD-10-CM

## 2019-11-09 NOTE — Unmapped (Signed)
Electrodessication and curettage procedure note:    ?? We performed an EDC procedure, per below, of SCCis on the L forearm.  ?? Lesion site after first pass of curette: 20 mm.    Lesion sites and patient's identity were verified and timeout was performed.  After verbal consent was obtained, the area was prepped with alcohol and anesthetized with 5 ml 1% lidocaine with epinephrine (1:100,000).  Curettage followed by electrodesiccation was performed for 3 cycles. The area was dressed with petroleum jelly and and a bandage. The patient tolerated the procedure well and was instructed on wound care and given a handout with this information.    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

## 2019-11-09 NOTE — Unmapped (Signed)
ED&C     ED&C or curettage is a procedure in which a cancerous or precancerous area of the skin is removed. The abnormal cells are more fragile than the healthy cells surrounding the area, thus they are easily removed with scraping with a sharp instrument. To stop any bleeding, the scraping may be followed by light burning. The combination of scraping and burning is called electrodesiccation and curettage or ED &C. For certain types of cancer or precancerous changes, the scraping and burning cycle may be repeated.     To care for the area: Leave the bandage in place until the morning after your procedure is performed. On a daily basis, carefully remove the bandage, then shower or wash as usual. Allow water to run over the site. Please do not scrub. Carefully dry the area, then apply ointment (some people develop an allergy to Neosporin, so we recommend Vaseline or Aquaphor). Cover the site with a fresh bandage. Should any bleeding occur, apply firm pressure for 15 minutes. The treated site will heal best if  a scab never forms (the wound heals by new skin cells traveling from the outside toward the middle-their journey is easier if no scab stands in their way).    Long-term care: the site will be more sensitive than your surrounding skin. Keep it covered, and remember to apply sunscreen every day to all your exposed skin. A scar may remain which is lighter or pinker than your normal skin. Your body will continue to improve your scar for up to one year.    Infection following this procedure is rare. However, if you are worried about the appearance of your site, contact your doctor. Complete healing of an ED &C site may take up to one month or 6 weeks. We have a physician on call at all times. If you have any concerns about the site, please call our clinic at [phone]

## 2019-11-28 NOTE — Unmapped (Addendum)
[facility] Clinical Assessment & Refill Coordination Note    Sophia Davis, DOB: July 15, 1946  Phone: [phone] (home)     All above HIPAA information was verified with patient.     Was a Nurse, learning disability used for this call? No    Specialty Medication(s):   Transplant: Myfortic 180mg  and tacrolimus 0.5mg      Current Outpatient Medications   Medication Sig Dispense Refill   ??? alendronate (FOSAMAX) 70 MG tablet Take 1 tablet (70 mg total) by mouth every seven (7) days. 4 tablet 12   ??? amLODIPine (NORVASC) 5 MG tablet Take 5 mg by mouth every evening.     ??? aspirin 81 MG chewable tablet Chew 81 mg daily.     ??? atorvastatin (LIPITOR) 40 MG tablet Take 1 tablet by mouth every evening 90 tablet 3   ??? cholecalciferol, vitamin D3, 1,000 unit (25 mcg) tablet TAKE 1 CAPSULE BY MOUTH ONCE DAILY 90 tablet 3   ??? famotidine (PEPCID) 20 MG tablet Take 1 tablet (20 mg total) by mouth two (2) times a day as needed for heartburn. 60 tablet 11   ??? losartan (COZAAR) 50 MG tablet Take 1 tablet (50 mg total) by mouth two (2) times a day. 180 tablet PRN   ??? MYFORTIC 180 mg EC tablet TAKE 2 TABLETS (360 MG TOTAL) BY MOUTH TWO (2) TIMES A DAY. 120 tablet PRN   ??? tacrolimus (PROGRAF) 0.5 MG capsule Take 2 capsules (1 mg total) by mouth two (2) times a day. 120 capsule 11   ??? triamterene-hydrochlorothiazide (DYAZIDE) 37.5-25 mg per capsule Take 1 capsule by mouth every morning. 30 capsule 11     No current facility-administered medications for this visit.         Changes to medications: Leola reports no changes at this time.    No Known Allergies    Changes to allergies: No    SPECIALTY MEDICATION ADHERENCE     Myfortic 180 mg: 7 days of medicine on hand   Tacrolimus 0.5 mg: 7 days of medicine on hand     Medication Adherence    Patient reported X missed doses in the last month: 0  Specialty Medication: Myfortic 180mg   Patient is on additional specialty medications: Yes  Additional Specialty Medications: Tacrolimus 0.5mg   Patient Reported Additional Medication X Missed Doses in the Last Month: 0  Patient is on more than two specialty medications: No  Adherence tools used: patient uses a pill box to manage medications          Specialty medication(s) dose(s) confirmed: Regimen is correct and unchanged.     Are there any concerns with adherence? No    Adherence counseling provided? Not needed    CLINICAL MANAGEMENT AND INTERVENTION      Clinical Benefit Assessment:    Do you feel the medicine is effective or helping your condition? Yes    Clinical Benefit counseling provided? Not needed    Adverse Effects Assessment:    Are you experiencing any side effects? No    Are you experiencing difficulty administering your medicine? No    Quality of Life Assessment:    How many days over the past month did your kidney transplant  keep you from your normal activities? For example, brushing your teeth or getting up in the morning. 0    Have you discussed this with your provider? Not needed    Therapy Appropriateness:    Is therapy appropriate? Yes, therapy is  appropriate and should be continued    DISEASE/MEDICATION-SPECIFIC INFORMATION      N/A    PATIENT SPECIFIC NEEDS     ? Does the patient have any physical, cognitive, or cultural barriers? No    ? Is the patient high risk? Yes, patient is taking a REMS drug. Medication is dispensed in compliance with REMS program.     ? Does the patient require a Care Management Plan? No     ? Does the patient require physician intervention or other additional services (i.e. nutrition, smoking cessation, social work)? No      SHIPPING     Specialty Medication(s) to be Shipped:   Transplant: Myfortic 180mg  and tacrolimus 0.5mg     Other medication(s) to be shipped: Vit D3, Triamterene/HCTZ     Changes to insurance: No    Delivery Scheduled: Yes, Expected medication delivery date: 12/04/19.     Medication will be delivered via Same Day Courier to the confirmed prescription address in [facility] The patient will receive a drug information handout for each medication shipped and additional FDA Medication Guides as required.  Verified that patient has previously received a Conservation officer, historic buildings.    All of the patient's questions and concerns have been addressed.    [name]   [facility] Specialty Pharmacist

## 2019-12-04 MED FILL — MYFORTIC 180 MG TABLET,DELAYED RELEASE: 30 days supply | Qty: 120 | Fill #12 | Status: AC

## 2019-12-04 MED FILL — CHOLECALCIFEROL (VITAMIN D3) 25 MCG (1,000 UNIT) TABLET: 90 days supply | Qty: 90 | Fill #3 | Status: AC

## 2019-12-04 MED FILL — TRIAMTERENE 37.5 MG-HYDROCHLOROTHIAZIDE 25 MG CAPSULE: 30 days supply | Qty: 30 | Fill #5 | Status: AC

## 2019-12-04 MED FILL — CHOLECALCIFEROL (VITAMIN D3) 25 MCG (1,000 UNIT) TABLET: ORAL | 90 days supply | Qty: 90 | Fill #3

## 2019-12-04 MED FILL — TRIAMTERENE 37.5 MG-HYDROCHLOROTHIAZIDE 25 MG CAPSULE: ORAL | 30 days supply | Qty: 30 | Fill #5

## 2019-12-04 MED FILL — MYFORTIC 180 MG TABLET,DELAYED RELEASE: 30 days supply | Qty: 120 | Fill #12

## 2019-12-04 MED FILL — TACROLIMUS 0.5 MG CAPSULE, IMMEDIATE-RELEASE: ORAL | 30 days supply | Qty: 120 | Fill #5

## 2019-12-04 MED FILL — TACROLIMUS 0.5 MG CAPSULE: 30 days supply | Qty: 120 | Fill #5 | Status: AC

## 2019-12-22 DIAGNOSIS — Z940 Kidney transplant status: Principal | ICD-10-CM

## 2019-12-22 NOTE — Unmapped (Signed)
[facility] Refill Coordination Note    Specialty Medication(s) to be Shipped:   Transplant: Myfortic 180mg  and tacrolimus 0.5mg    **Sent refill request for myfortic**    Other medication(s) to be shipped: losartan 50mg  and triamterine/hctz 37.5-25mg      Sophia Davis, DOB: September 19, 1946  Phone: [phone] (home)       All above HIPAA information was verified with patient.     Was a Nurse, learning disability used for this call? No    Completed refill call assessment today to schedule patient's medication shipment from the [facility] [phone]).       Specialty medication(s) and dose(s) confirmed: Regimen is correct and unchanged.   Changes to medications: Sophia Davis reports no changes at this time.  Changes to insurance: No  Questions for the pharmacist: No    Confirmed patient received Welcome Packet with first shipment. The patient will receive a drug information handout for each medication shipped and additional FDA Medication Guides as required.       DISEASE/MEDICATION-SPECIFIC INFORMATION        N/A    SPECIALTY MEDICATION ADHERENCE     Medication Adherence    Patient reported X missed doses in the last month: 0  Specialty Medication: Myfortic 180mg   Patient is on additional specialty medications: Yes  Additional Specialty Medications: Tacrolimus 0.5mg   Patient Reported Additional Medication X Missed Doses in the Last Month: 0  Patient is on more than two specialty medications: No  Adherence tools used: patient uses a pill box to manage medications          Myfortic 180 mg: 10 days of medicine on hand   Tacrolimus 0.5 mg: 10 days of medicine on hand     SHIPPING     Shipping address confirmed in Epic.     Delivery Scheduled: Yes, Expected medication delivery date: 01/01/2020.     Medication will be delivered via Same Day Courier to the prescription address in Epic WAM.    [name]   [facility] Specialty Technician

## 2019-12-25 MED ORDER — MYFORTIC 180 MG TABLET,DELAYED RELEASE
ORAL_TABLET | Freq: Two times a day (BID) | ORAL | PRN refills | 30.00000 days | Status: CP
Start: 2019-12-25 — End: 2020-12-24
  Filled 2020-01-01: qty 120, 30d supply, fill #0

## 2019-12-29 NOTE — Unmapped (Signed)
1st attempt to reschedule appointment. Sophia Davis

## 2020-01-01 MED ORDER — LOSARTAN 50 MG TABLET
ORAL_TABLET | Freq: Two times a day (BID) | ORAL | PRN refills | 90.00000 days | Status: CP
Start: 2020-01-01 — End: ?
  Filled 2020-01-01: qty 180, 90d supply, fill #0

## 2020-01-01 MED FILL — MYFORTIC 180 MG TABLET,DELAYED RELEASE: 30 days supply | Qty: 120 | Fill #0 | Status: AC

## 2020-01-01 MED FILL — TRIAMTERENE 37.5 MG-HYDROCHLOROTHIAZIDE 25 MG CAPSULE: ORAL | 30 days supply | Qty: 30 | Fill #6

## 2020-01-01 MED FILL — TACROLIMUS 0.5 MG CAPSULE, IMMEDIATE-RELEASE: 30 days supply | Qty: 120 | Fill #6 | Status: AC

## 2020-01-01 MED FILL — LOSARTAN 50 MG TABLET: 90 days supply | Qty: 180 | Fill #0 | Status: AC

## 2020-01-01 MED FILL — TRIAMTERENE 37.5 MG-HYDROCHLOROTHIAZIDE 25 MG CAPSULE: 30 days supply | Qty: 30 | Fill #6 | Status: AC

## 2020-01-01 MED FILL — TACROLIMUS 0.5 MG CAPSULE, IMMEDIATE-RELEASE: ORAL | 30 days supply | Qty: 120 | Fill #6

## 2020-01-04 ENCOUNTER — Ambulatory Visit: Admit: 2020-01-04 | Discharge: 2020-01-05 | Payer: MEDICARE

## 2020-01-04 ENCOUNTER — Ambulatory Visit: Admit: 2020-01-04 | Discharge: 2020-01-05 | Payer: MEDICARE | Attending: Nephrology | Primary: Nephrology

## 2020-01-04 DIAGNOSIS — D899 Disorder involving the immune mechanism, unspecified: Principal | ICD-10-CM

## 2020-01-04 DIAGNOSIS — N183 Chronic kidney disease, stage 3 (moderate): Principal | ICD-10-CM

## 2020-01-04 DIAGNOSIS — I10 Essential (primary) hypertension: Principal | ICD-10-CM

## 2020-01-04 DIAGNOSIS — Z940 Kidney transplant status: Principal | ICD-10-CM

## 2020-01-04 DIAGNOSIS — Z79899 Other long term (current) drug therapy: Principal | ICD-10-CM

## 2020-01-04 LAB — CBC W/ AUTO DIFF
BASOPHILS ABSOLUTE COUNT: 0 10*9/L (ref 0.0–0.1)
BASOPHILS RELATIVE PERCENT: 0.5 %
EOSINOPHILS ABSOLUTE COUNT: 0.1 10*9/L (ref 0.0–0.7)
EOSINOPHILS RELATIVE PERCENT: 2.1 %
HEMOGLOBIN: 11.8 g/dL — ABNORMAL LOW (ref 12.0–15.5)
LYMPHOCYTES ABSOLUTE COUNT: 1.1 10*9/L (ref 0.7–4.0)
LYMPHOCYTES RELATIVE PERCENT: 22 %
MEAN CORPUSCULAR HEMOGLOBIN CONC: 32.5 g/dL (ref 30.0–36.0)
MEAN CORPUSCULAR HEMOGLOBIN: 26.7 pg (ref 26.0–34.0)
MEAN CORPUSCULAR VOLUME: 82.2 fL (ref 82.0–98.0)
MEAN PLATELET VOLUME: 7.9 fL (ref 7.0–10.0)
MONOCYTES ABSOLUTE COUNT: 0.6 10*9/L (ref 0.1–1.0)
MONOCYTES RELATIVE PERCENT: 12.3 %
NEUTROPHILS ABSOLUTE COUNT: 3.2 10*9/L (ref 1.7–7.7)
NEUTROPHILS RELATIVE PERCENT: 63.1 %
PLATELET COUNT: 279 10*9/L (ref 150–450)
RED BLOOD CELL COUNT: 4.42 10*12/L (ref 3.90–5.03)
WBC ADJUSTED: 5.2 10*9/L (ref 3.5–10.5)

## 2020-01-04 LAB — NEUTROPHILS RELATIVE PERCENT: Neutrophils/100 leukocytes:NFr:Pt:Bld:Qn:Automated count: 63.1

## 2020-01-04 LAB — URINALYSIS
BILIRUBIN UA: NEGATIVE
BLOOD UA: NEGATIVE
GLUCOSE UA: NEGATIVE
LEUKOCYTE ESTERASE UA: NEGATIVE
NITRITE UA: NEGATIVE
PH UA: 5.5 (ref 5.0–9.0)
PROTEIN UA: NEGATIVE
RBC UA: 3 /HPF (ref <4)
SPECIFIC GRAVITY UA: 1.02 (ref 1.005–1.030)
SQUAMOUS EPITHELIAL: 6 /HPF — ABNORMAL HIGH (ref 0–5)
UROBILINOGEN UA: 0.2
WBC UA: 8 /HPF — ABNORMAL HIGH (ref 0–5)

## 2020-01-04 LAB — COMPREHENSIVE METABOLIC PANEL
ALBUMIN: 4.3 g/dL (ref 3.4–5.0)
ALKALINE PHOSPHATASE: 79 U/L (ref 46–116)
ANION GAP: 7 mmol/L (ref 3–11)
AST (SGOT): 19 U/L (ref <34)
BILIRUBIN TOTAL: 0.7 mg/dL (ref 0.3–1.2)
BLOOD UREA NITROGEN: 19 mg/dL (ref 9–23)
BUN / CREAT RATIO: 15
CALCIUM: 10.4 mg/dL (ref 8.7–10.4)
CHLORIDE: 98 mmol/L (ref 98–107)
CO2: 28.5 mmol/L (ref 20.0–31.0)
CREATININE: 1.23 mg/dL — ABNORMAL HIGH (ref 0.60–1.10)
EGFR CKD-EPI AA FEMALE: 50 mL/min/{1.73_m2}
EGFR CKD-EPI NON-AA FEMALE: 43 mL/min/{1.73_m2}
GLUCOSE RANDOM: 116 mg/dL — ABNORMAL HIGH (ref 70–99)
POTASSIUM: 3.5 mmol/L (ref 3.5–5.1)
PROTEIN TOTAL: 6.6 g/dL (ref 5.7–8.2)
SODIUM: 133 mmol/L — ABNORMAL LOW (ref 135–145)

## 2020-01-04 LAB — BILIRUBIN UA: Bilirubin:PrThr:Pt:Urine:Ord:Test strip: NEGATIVE

## 2020-01-04 LAB — MAGNESIUM: Magnesium:MCnc:Pt:Ser/Plas:Qn:: 1.8

## 2020-01-04 LAB — ALKALINE PHOSPHATASE: Alkaline phosphatase:CCnc:Pt:Ser/Plas:Qn:: 79

## 2020-01-04 LAB — PHOSPHORUS: Phosphate:MCnc:Pt:Ser/Plas:Qn:: 4

## 2020-01-04 NOTE — Unmapped (Signed)
Saw patient in clinic today. Reports she is doing well.    Trigger finger, middle left finger.  Had shot awhile ago and will follow up with ortho MD    Denies HA, CP, heart arrhythmia, SOB, n/v/d, urinary problems, swelling, fevers, tremors, numbness or tingling.    Labs today and took prograf at 9pm.    Bp avg 119-120's/50-60

## 2020-01-05 LAB — CMV COMMENT: Lab: 0

## 2020-01-05 LAB — TACROLIMUS, TROUGH: Lab: 4.7 — ABNORMAL LOW

## 2020-01-05 LAB — CMV DNA, QUANTITATIVE, PCR

## 2020-01-05 NOTE — Unmapped (Signed)
[facility] transplant nephrology clinic visit    assessment and plan  1. s/p kidney transplant 05/17/13. baseline creatinine 1-1.2 mg/dl. no proteinuria. no donor specific hla ab detected.   2. immunosuppression. mycophenolate sodium 360mg  bid. tacrolimus 12hr lvl 4-7 ng/ml.  3. hypertension. blood pressure goal < 130/80 mmhg.  4. health maintenance. influenza '20. pcv13 pneumococcal '14. ppsv23 pneumococcal '16. zoster '19. mammogram '19. gyn exam '13/post hysterectomy. cystoscopy '14. colonoscopy '20 +LSIL; nl exam 6/20 with repeat in 54m recommended. dermatology appt '21. kidney ultrasound '20. covid-19 vaccine strongly recommended when available.    history of present illness    Sophia Davis is a 74 year old woman seen in follow up post kidney transplant 05/17/2013. she feels well. no fever chills or sweats. no headache or lightheaded. no chest pain cough or shortness of breath. no lower extremity edema. appetite nl. no abdominal pain no n/v/d. no myalgias or arthralgias. no dysuria hematuria or difficulty voiding. blood pressure 120s/60s. all other systems reviewed and negative x10 systems.    past medical hx:   1. s/p deceased donor kidney transplant 05/17/2013. mpo-anca+ chronic gn. alemtuzumab induction. baseline creatinine 1-1.2 mg/dl.  2. mpo-anca+ vasculitis/granulomatosis with polyangiitis. hx pulmonary hemorrhage. trtmt plasma exchange, iv methylprednisolone, iv/po cyclophosphamide x total 63m '03-'07; rituximab 1gm x2 '07.  3. hypertension   4. coronary artery disease s/p pci/stent '97. nl cards stress '13: lvef > 65%. no myocardial ischemia   5. hyperlipidemia  6. osteoporosis  7. secondary/tertiary hyperparathyroidism    past surgical hx: total abdominal hysterectomy '98. 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 kidney transplant '14. right hand 3rd/4th trigger finger release '16.    allergies: nkda     medications: tacrolimus 1mg  bid, mycophenolate sodium 360mg  bid, losartan 50mg  bid, amlodipine 5mg  q.pm, triamterene/hydrochlorothiazide 37.5/25mg  daily, atorvastatin 40mg  daily, aspirin 81mg  daily, vitamin d 1,000u daily, famotidine 20mg  prn, alendronate 70mg  q.wk.    soc hx: widowed x3 children. distant smoking hx; none > 40 yrs.    physical exam: t98 p60 bp155/64 wt57.2kg bmi23.8. wd/wn woman appropriate affect and mood. nl sclera anicteric. wearing mask. neck supple no palpable ln. heart rrr nl s1s2 no m/r/g. lungs clear bilateral. abdomen soft nt/nd. no lower ext edema. msk no synovitis or tophi. skin no rash. neuro alert oriented non focal exam.    labs 01/04/20: wbc5.2 hgb11.8 hct36.3 plt279. VW098 k3.5 cl98 bicarb28 bun19 cr1.2 glc116 ca10.4 mg1.8 phos4.0 albumin4.3 liver function panel nl. tacrolimus 12hr lvl pending. urine protein/cr pending.

## 2020-01-06 LAB — BK VIRUS QUANTITATIVE PCR, BLOOD

## 2020-01-06 LAB — BK BLOOD COMMENT: Lab: 0

## 2020-01-23 NOTE — Unmapped (Signed)
[facility] Refill Coordination Note    Specialty Medication(s) to be Shipped:   Transplant: Myfortic 180mg  and tacrolimus 0.5mg     Other medication(s) to be shipped: triam/hctz and atorvastatin 40mg      Sophia Davis, DOB: Sep 12, 1946  Phone: [phone] (home)       All above HIPAA information was verified with patient.     Was a Nurse, learning disability used for this call? No    Completed refill call assessment today to schedule patient's medication shipment from the [facility] [phone]).       Specialty medication(s) and dose(s) confirmed: Regimen is correct and unchanged.   Changes to medications: [name] reports no changes at this time.  Changes to insurance: No  Questions for the pharmacist: No    Confirmed patient received Welcome Packet with first shipment. The patient will receive a drug information handout for each medication shipped and additional FDA Medication Guides as required.       DISEASE/MEDICATION-SPECIFIC INFORMATION        N/A    SPECIALTY MEDICATION ADHERENCE     Medication Adherence    Patient reported X missed doses in the last month: 0  Specialty Medication: Myfortic 180mg   Patient is on additional specialty medications: Yes  Additional Specialty Medications: Tacrolimus 0.5mg   Patient Reported Additional Medication X Missed Doses in the Last Month: 0  Patient is on more than two specialty medications: No  Adherence tools used: patient uses a pill box to manage medications          Myfortic 180 mg: 10 days of medicine on hand   Tacrolimus 0.5 mg: 10 days of medicine on hand     SHIPPING     Shipping address confirmed in Epic.     Delivery Scheduled: Yes, Expected medication delivery date: 01/29/2020.     Medication will be delivered via Same Day Courier to the prescription address in Epic WAM.    [name]   [facility] Specialty Technician

## 2020-01-29 MED FILL — MYFORTIC 180 MG TABLET,DELAYED RELEASE: 30 days supply | Qty: 120 | Fill #1 | Status: AC

## 2020-01-29 MED FILL — TRIAMTERENE 37.5 MG-HYDROCHLOROTHIAZIDE 25 MG CAPSULE: ORAL | 30 days supply | Qty: 30 | Fill #7

## 2020-01-29 MED FILL — ATORVASTATIN 40 MG TABLET: 90 days supply | Qty: 90 | Fill #1 | Status: AC

## 2020-01-29 MED FILL — ATORVASTATIN 40 MG TABLET: ORAL | 90 days supply | Qty: 90 | Fill #1

## 2020-01-29 MED FILL — TRIAMTERENE 37.5 MG-HYDROCHLOROTHIAZIDE 25 MG CAPSULE: 30 days supply | Qty: 30 | Fill #7 | Status: AC

## 2020-01-29 MED FILL — TACROLIMUS 0.5 MG CAPSULE, IMMEDIATE-RELEASE: 30 days supply | Qty: 120 | Fill #7 | Status: AC

## 2020-01-29 MED FILL — TACROLIMUS 0.5 MG CAPSULE, IMMEDIATE-RELEASE: ORAL | 30 days supply | Qty: 120 | Fill #7

## 2020-01-29 MED FILL — MYFORTIC 180 MG TABLET,DELAYED RELEASE: ORAL | 30 days supply | Qty: 120 | Fill #1

## 2020-01-29 NOTE — Unmapped (Signed)
Pt request for RX Refill

## 2020-01-30 MED ORDER — ALENDRONATE 70 MG TABLET
ORAL_TABLET | ORAL | 4 refills | 84 days | Status: CP
Start: 2020-01-30 — End: 2021-01-29

## 2020-02-23 MED ORDER — CHOLECALCIFEROL (VITAMIN D3) 25 MCG (1,000 UNIT) TABLET
ORAL_TABLET | ORAL | 3 refills | 0 days
Start: 2020-02-23 — End: 2021-02-22

## 2020-02-23 NOTE — Unmapped (Signed)
[facility] Refill Coordination Note    Specialty Medication(s) to be Shipped:   Transplant: Myfortic 180mg  and tacrolimus 0.5mg     Other medication(s) to be shipped: vitmain D (sent rf request) and triam/hctz     Sophia Davis, DOB: 01-27-1946  Phone: [phone] (home)       All above HIPAA information was verified with patient.     Was a Nurse, learning disability used for this call? No    Completed refill call assessment today to schedule patient's medication shipment from the [facility] [phone]).       Specialty medication(s) and dose(s) confirmed: Regimen is correct and unchanged.   Changes to medications: Sophia Davis reports no changes at this time.  Changes to insurance: No  Questions for the pharmacist: No    Confirmed patient received Welcome Packet with first shipment. The patient will receive a drug information handout for each medication shipped and additional FDA Medication Guides as required.       DISEASE/MEDICATION-SPECIFIC INFORMATION        N/A    SPECIALTY MEDICATION ADHERENCE     Medication Adherence    Patient reported X missed doses in the last month: 0  Specialty Medication: Myfortic 180mg   Patient is on additional specialty medications: Yes  Additional Specialty Medications: Tacrolimus 0.5mg   Patient Reported Additional Medication X Missed Doses in the Last Month: 0  Patient is on more than two specialty medications: No  Adherence tools used: patient uses a pill box to manage medications          Myfortic 180 mg: 10 days of medicine on hand   Tacrolimus 0.5 mg: 10 days of medicine on hand     SHIPPING     Shipping address confirmed in Epic.     Delivery Scheduled: Yes, Expected medication delivery date: 03/04/2020.  **Pt request**    Medication will be delivered via Same Day Courier to the prescription address in Epic WAM.    Lorelei Pont [facility] Specialty Technician

## 2020-03-04 MED FILL — MYFORTIC 180 MG TABLET,DELAYED RELEASE: ORAL | 30 days supply | Qty: 120 | Fill #2

## 2020-03-04 MED FILL — TACROLIMUS 0.5 MG CAPSULE, IMMEDIATE-RELEASE: ORAL | 30 days supply | Qty: 120 | Fill #8

## 2020-03-04 MED FILL — TRIAMTERENE 37.5 MG-HYDROCHLOROTHIAZIDE 25 MG CAPSULE: 30 days supply | Qty: 30 | Fill #8 | Status: AC

## 2020-03-04 MED FILL — TACROLIMUS 0.5 MG CAPSULE, IMMEDIATE-RELEASE: 30 days supply | Qty: 120 | Fill #8 | Status: AC

## 2020-03-04 MED FILL — CHOLECALCIFEROL (VITAMIN D3) 25 MCG (1,000 UNIT) TABLET: ORAL | 90 days supply | Qty: 90 | Fill #0

## 2020-03-04 MED FILL — TRIAMTERENE 37.5 MG-HYDROCHLOROTHIAZIDE 25 MG CAPSULE: ORAL | 30 days supply | Qty: 30 | Fill #8

## 2020-03-04 MED FILL — CHOLECALCIFEROL (VITAMIN D3) 25 MCG (1,000 UNIT) TABLET: 90 days supply | Qty: 90 | Fill #0 | Status: AC

## 2020-03-04 MED FILL — MYFORTIC 180 MG TABLET,DELAYED RELEASE: 30 days supply | Qty: 120 | Fill #2 | Status: AC

## 2020-03-07 MED ORDER — FAMOTIDINE 20 MG TABLET
ORAL_TABLET | Freq: Two times a day (BID) | ORAL | 3 refills | 90 days | Status: CP | PRN
Start: 2020-03-07 — End: 2021-03-07

## 2020-03-07 NOTE — Unmapped (Signed)
Pt request RX Refill FAMOTIDINE 20 MG TABLET

## 2020-03-25 NOTE — Unmapped (Signed)
[facility] Refill Coordination Note    Specialty Medication(s) to be Shipped:   Transplant: Myfortic 180mg  and tacrolimus 0.5mg     Other medication(s) to be shipped: losartan 50mg  and triam/hctz     Sophia Davis, DOB: August 16, 1946  Phone: [phone] (home)       All above HIPAA information was verified with patient.     Was a Nurse, learning disability used for this call? No    Completed refill call assessment today to schedule patient's medication shipment from the [facility] [phone]).       Specialty medication(s) and dose(s) confirmed: Regimen is correct and unchanged.   Changes to medications: [name] reports no changes at this time.  Changes to insurance: No  Questions for the pharmacist: No    Confirmed patient received Welcome Packet with first shipment. The patient will receive a drug information handout for each medication shipped and additional FDA Medication Guides as required.       DISEASE/MEDICATION-SPECIFIC INFORMATION        N/A    SPECIALTY MEDICATION ADHERENCE     Medication Adherence    Patient reported X missed doses in the last month: 0  Specialty Medication: Myfortic 180mg   Patient is on additional specialty medications: Yes  Additional Specialty Medications: Tacrolimus 0.5mg   Patient Reported Additional Medication X Missed Doses in the Last Month: 0  Patient is on more than two specialty medications: No  Adherence tools used: patient uses a pill box to manage medications        Myfortic 180 mg: 9 days of medicine on hand   Tacrolimus 0.5 mg: 9 days of medicine on hand     SHIPPING     Shipping address confirmed in Epic.     Delivery Scheduled: Yes, Expected medication delivery date: 04/01/2020.     Medication will be delivered via Same Day Courier to the prescription address in Epic WAM.    Sophia Davis   [facility] Specialty Technician

## 2020-03-27 ENCOUNTER — Ambulatory Visit: Admit: 2020-03-27 | Discharge: 2020-03-28 | Payer: MEDICARE

## 2020-03-27 DIAGNOSIS — I10 Essential (primary) hypertension: Principal | ICD-10-CM

## 2020-03-27 DIAGNOSIS — Z1231 Encounter for screening mammogram for malignant neoplasm of breast: Principal | ICD-10-CM

## 2020-03-27 DIAGNOSIS — N2581 Secondary hyperparathyroidism of renal origin: Principal | ICD-10-CM

## 2020-03-27 DIAGNOSIS — J302 Other seasonal allergic rhinitis: Principal | ICD-10-CM

## 2020-03-27 DIAGNOSIS — D849 Immunodeficiency, unspecified: Principal | ICD-10-CM

## 2020-03-27 DIAGNOSIS — E7800 Pure hypercholesterolemia, unspecified: Principal | ICD-10-CM

## 2020-03-27 DIAGNOSIS — J90 Pleural effusion, not elsewhere classified: Principal | ICD-10-CM

## 2020-03-27 DIAGNOSIS — M3131 Wegener's granulomatosis with renal involvement: Principal | ICD-10-CM

## 2020-03-27 DIAGNOSIS — Z90710 Acquired absence of both cervix and uterus: Principal | ICD-10-CM

## 2020-03-27 DIAGNOSIS — Z940 Kidney transplant status: Principal | ICD-10-CM

## 2020-03-27 DIAGNOSIS — C539 Malignant neoplasm of cervix uteri, unspecified: Principal | ICD-10-CM

## 2020-03-27 LAB — COMPREHENSIVE METABOLIC PANEL
ALBUMIN: 4.1 g/dL (ref 3.4–5.0)
ALKALINE PHOSPHATASE: 79 U/L (ref 46–116)
ALT (SGPT): 10 U/L (ref 10–49)
ANION GAP: 8 mmol/L (ref 3–11)
AST (SGOT): 15 U/L (ref <34)
BILIRUBIN TOTAL: 0.9 mg/dL (ref 0.3–1.2)
BUN / CREAT RATIO: 15
CALCIUM: 9.8 mg/dL (ref 8.7–10.4)
CHLORIDE: 97 mmol/L — ABNORMAL LOW (ref 98–107)
CO2: 25.4 mmol/L (ref 20.0–31.0)
CREATININE: 1.15 mg/dL — ABNORMAL HIGH (ref 0.50–0.80)
EGFR CKD-EPI NON-AA FEMALE: 47 mL/min/{1.73_m2}
GLUCOSE RANDOM: 108 mg/dL (ref 70–179)
POTASSIUM: 3.9 mmol/L (ref 3.5–5.1)
PROTEIN TOTAL: 6.6 g/dL (ref 5.7–8.2)
SODIUM: 130 mmol/L — ABNORMAL LOW (ref 135–145)

## 2020-03-27 LAB — CBC W/ AUTO DIFF
BASOPHILS ABSOLUTE COUNT: 0.1 10*9/L (ref 0.0–0.1)
BASOPHILS RELATIVE PERCENT: 0.7 %
EOSINOPHILS ABSOLUTE COUNT: 0.2 10*9/L (ref 0.0–0.7)
EOSINOPHILS RELATIVE PERCENT: 2.2 %
HEMOGLOBIN: 11.7 g/dL — ABNORMAL LOW (ref 12.0–15.5)
LYMPHOCYTES ABSOLUTE COUNT: 0.7 10*9/L (ref 0.7–4.0)
LYMPHOCYTES RELATIVE PERCENT: 9.4 %
MEAN CORPUSCULAR HEMOGLOBIN CONC: 32 g/dL (ref 30.0–36.0)
MEAN CORPUSCULAR VOLUME: 81.4 fL — ABNORMAL LOW (ref 82.0–98.0)
MEAN PLATELET VOLUME: 8.1 fL (ref 7.0–10.0)
MONOCYTES ABSOLUTE COUNT: 0.7 10*9/L (ref 0.1–1.0)
MONOCYTES RELATIVE PERCENT: 9.3 %
NEUTROPHILS RELATIVE PERCENT: 78.4 %
PLATELET COUNT: 248 10*9/L (ref 150–450)
RED BLOOD CELL COUNT: 4.51 10*12/L (ref 3.90–5.03)
RED CELL DISTRIBUTION WIDTH: 14.6 % (ref 12.0–15.0)
WBC ADJUSTED: 7.2 10*9/L (ref 3.5–10.5)

## 2020-03-27 LAB — MONOCYTES ABSOLUTE COUNT: Monocytes:NCnc:Pt:Bld:Qn:Automated count: 0.7

## 2020-03-27 LAB — BILIRUBIN TOTAL: Bilirubin:MCnc:Pt:Ser/Plas:Qn:: 0.9

## 2020-03-27 LAB — TACROLIMUS, TROUGH: Lab: 4 — ABNORMAL LOW

## 2020-03-27 NOTE — Unmapped (Addendum)
For sinus: use Flonase. One spray each nostril each day. Try for at least 2 weeks before deciding whether or not it is working.    You may take Benadryl if your sinuses are bothering you. You can take it even if you are taking Flonase. They are safe together.    Take your blood pressure this week and call my office and read them the blood pressure readings.  [phone]    Cut back on salt.

## 2020-03-27 NOTE — Unmapped (Signed)
Internal Medicine Clinic Visit    Reason for visit: 80-month follow-up for multiple problems    A/P:        Sophia Davis was seen today for follow-up.    Diagnoses and all orders for this visit:    Secondary hyperparathyroidism (CMS-HCC)  She has secondary hyperparathyroidism due to her kidney disease.  She does take vitamin D  Immunosuppression (CMS-HCC)  Tacrolimus trough was ordered today still pending result.  We will discuss this with Dr. Toni Arthurs when the result is here  Essential hypertension (RAF-HCC)  Her blood pressure was quite elevated today.  She had just taken her medications approximately 1 hour before I saw her but still should not be that high.  She did not seem anxious.  Rather than making changes today I have asked her to check her blood pressure over the next week and call my practice with the results.  If they are still elevated we may need to make adjustments to her medications  Malignant neoplasm of cervix, unspecified site (CMS-HCC)  She had a history of cervical cancer that was treated with a hysterectomy and she remains symptom-free.  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  I do not believe her current sinus or allergy symptoms are related to the granulomatosis.  Likely the disease is responding to tacrolimus.  Kidney transplant 05/17/2013  -     CBC w/ Differential  -     Comprehensive Metabolic Panel  -     Tacrolimus Level, Trough; Future  -     Tacrolimus Level, Trough  -     Comprehensive Metabolic Panel; Future  -     CBC w/ Differential; Future  -     Tacrolimus Level, Trough; Future  Creatinine and blood counts remain normal.  Mild hyponatremia but consistent with prior draws.  Tacrolimus level still pending.  Would not recommend any changes right now based on these lab results.  Visit for screening mammogram  -     Mammo Digital Screening Bilateral; Future  She is past due for mammogram and I have ordered it today and asked her to get it scheduled.  Pleural effusion  -     XR Chest 2 views; Future  She had a chest x-ray in 2019 showing a very small pleural effusion.  I repeated it today which shows more likely pleural thickening but no change over the past 2 years.  We will leave this alone  Status post total abdominal hysterectomy  She is status post hysterectomy for her cervical cancer  Hypercholesterolemia  She is on treatment of with statins for her hypercholesterolemia and coronary artery disease.  Allergies  I recommended she try Flonase nasal spray and she has some that she can use at home but she is never used it herself.  Also told her it would be fine for her to use Benadryl if she would like.      __________________________________________________________    HPI:    74 y.o. female here for follow-up.  She is overall doing very well.  She has lots of energy and likes to be going all the time.  She is received the Covid vaccine.  She has noticed some seasonal allergies recently where she is sneezing a lot and wondering if there is anything we can do about that.  She has not been measuring her blood pressure all that often at home but when she does it is generally below 130.  She  has not had any other symptoms  __________________________________________________________      Medications:  See below.    __________________________________________________________    Physical Exam:   Vital Signs:  Vitals:    03/27/20 0926   BP: 154/62   BP Site: L Arm   BP Position: Sitting   BP Cuff Size: Small   Pulse: 66   Temp: 35.9 ??C (96.6 ??F)   TempSrc: Temporal   SpO2: 93%   Weight: 56.7 kg (125 lb)   Height: 154.9 cm (5' 1)       Gen: Well appearing, NAD  HEENT: No cervical lymphadenopathy, OP clear. No thyromegaly  CV: RRR, no murmurs  Pulm: CTA bilaterally, no crackles or wheezes  GI: abdomen soft, NTND, normal BS. No HSM.  Pulses: 2+ radial  Ext: No edema         Your Medication List          Accurate as of March 27, 2020  1:58 PM. If you have any questions, ask your nurse or doctor.            CONTINUE taking these medications    alendronate 70 MG tablet  Commonly known as: FOSAMAX  TAKE 1 TABLET (70 MG TOTAL) BY MOUTH EVERY SEVEN (7) DAYS.     amLODIPine 5 MG tablet  Commonly known as: NORVASC  Take 5 mg by mouth every evening.     aspirin 81 MG chewable tablet  Chew 81 mg daily.     atorvastatin 40 MG tablet  Commonly known as: LIPITOR  Take 1 tablet by mouth every evening     cholecalciferol (vitamin D3 25 mcg (1,000 units)) 1,000 unit (25 mcg) tablet  Take 1 tablet (25 mcg total) by mouth daily.     famotidine 20 MG tablet  Commonly known as: PEPCID  TAKE 1 TABLET (20 MG TOTAL) BY MOUTH TWO (2) TIMES A DAY AS NEEDED FOR HEARTBURN.     losartan 50 MG tablet  Commonly known as: COZAAR  Take 1 tablet (50 mg total) by mouth two (2) times a day.     MYFORTIC 180 MG EC tablet  Generic drug: mycophenolate  Take 2 tablets (360 mg total) by mouth two (2) times a day.     tacrolimus 0.5 MG capsule  Commonly known as: PROGRAF  Take 2 capsules (1 mg total) by mouth two (2) times a day.     triamterene-hydrochlorothiazide 37.5-25 mg per capsule  Commonly known as: DYAZIDE  Take 1 capsule by mouth every morning.

## 2020-03-27 NOTE — Unmapped (Signed)
[facility] at [facility]       Type of visit:  face to face    Reason for visit: Follow up    Questions / Concerns that need to be addressed: no          Diabetes:  ??? Regularly checking blood sugars?: no        Hypertension:  ??? Have blood pressure cuff at home?: yes- arm cuff  ??? Regularly checking blood pressure?: no      Screening BP- 152/76 68        Omron BPs (complete if screening BP has a systolic  > 139 or diastolic > 89)  BP#1  154/62 68  BP#2   151/65 65  BP#3   158/60 66    Average BP 154/62 66  (please note this as a comment in vitals)         Allergies reviewed: Yes    Medication reviewed: Yes  Pended refills? No        HCDM reviewed and updated in Epic:    We are working to make sure all of our patients??? wishes are updated in Epic and part of that is documenting a Environmental health practitioner for each patient  A Health Care Decision Rodena Piety is someone you choose who can make health care decisions for you if you are not able ??? who would you most want to do this for you????  is already up to date.        BPAs completed:  PHQ2  PHQ9  AUDIT - Alcohol Screen  Falls Risk - adults 65+      COVID Vaccination:  If Care Gap for COVID vaccine is present:  Have you received a COVID vaccine? yes  If yes: How many doses have you received? 0/1/2: 1   Type of Vaccine received? J&J     __________________________________________________________________________________________    SCREENINGS COMPLETED IN FLOWSHEETS    HARK Screening       AUDIT       PHQ2  PHQ-2 Total Score : 0    PHQ9  Thoughts that you would be better off dead, or of hurting yourself in some way: Not at all  PHQ-9 TOTAL SCORE: 2    P4 Suicidality Screener                GAD7  GAD-7 Total Score: 0    COPD Assessment

## 2020-03-29 MED FILL — TACROLIMUS 0.5 MG CAPSULE, IMMEDIATE-RELEASE: 30 days supply | Qty: 120 | Fill #9 | Status: AC

## 2020-03-29 MED FILL — MYFORTIC 180 MG TABLET,DELAYED RELEASE: ORAL | 30 days supply | Qty: 120 | Fill #3

## 2020-03-29 MED FILL — TRIAMTERENE 37.5 MG-HYDROCHLOROTHIAZIDE 25 MG CAPSULE: ORAL | 30 days supply | Qty: 30 | Fill #9

## 2020-03-29 MED FILL — TACROLIMUS 0.5 MG CAPSULE, IMMEDIATE-RELEASE: ORAL | 30 days supply | Qty: 120 | Fill #9

## 2020-03-29 MED FILL — LOSARTAN 50 MG TABLET: 90 days supply | Qty: 180 | Fill #1 | Status: AC

## 2020-03-29 MED FILL — MYFORTIC 180 MG TABLET,DELAYED RELEASE: 30 days supply | Qty: 120 | Fill #3 | Status: AC

## 2020-03-29 MED FILL — TRIAMTERENE 37.5 MG-HYDROCHLOROTHIAZIDE 25 MG CAPSULE: 30 days supply | Qty: 30 | Fill #9 | Status: AC

## 2020-03-29 MED FILL — LOSARTAN 50 MG TABLET: ORAL | 90 days supply | Qty: 180 | Fill #1

## 2020-04-03 NOTE — Unmapped (Signed)
Patient state you had asked her to give you a call back and give you a message on how her Blood Pressure is doing for a week she's got the information for you now. Patient would like a call back on her home number and thank you.

## 2020-04-04 ENCOUNTER — Ambulatory Visit: Admit: 2020-04-04 | Discharge: 2020-04-05 | Payer: MEDICARE

## 2020-04-04 MED ORDER — AMLODIPINE 2.5 MG TABLET
ORAL_TABLET | Freq: Every day | ORAL | 3 refills | 90 days | Status: CP
Start: 2020-04-04 — End: 2021-04-04
  Filled 2020-04-09: qty 90, 90d supply, fill #0

## 2020-04-04 NOTE — Unmapped (Signed)
Hypertension:    Date Time BP Pulse    03/27/20 am     135/68   67      03/28/20 Am  Pm   154/68   135/67  60  68    6/11 Am  Pm   136/63  125/61 59  63    03/30/20  am  Pm   127/67  146/68   59  65      03/31/20  am  Pm   122/67  136/68    59  68      04/01/20 Am  Pm   138/66  142/64   68  58      04/02/20   Am  pm  135/64  140/67 60  62       04/03/20  Am 157/74 60      04/03/20 pm   136/68 67         04/04/20 am 149/77 62

## 2020-04-08 DIAGNOSIS — Z1231 Encounter for screening mammogram for malignant neoplasm of breast: Principal | ICD-10-CM

## 2020-04-09 MED FILL — AMLODIPINE 2.5 MG TABLET: 90 days supply | Qty: 90 | Fill #0 | Status: AC

## 2020-04-10 DIAGNOSIS — N3000 Acute cystitis without hematuria: Principal | ICD-10-CM

## 2020-04-10 MED ORDER — NITROFURANTOIN MONOHYDRATE/MACROCRYSTALS 100 MG CAPSULE
ORAL_CAPSULE | Freq: Two times a day (BID) | ORAL | 0 refills | 7 days | Status: CP
Start: 2020-04-10 — End: 2020-04-15

## 2020-04-10 NOTE — Unmapped (Signed)
I reviewed the triage note and this is completely consistent with UTI.  I have prescribed 7 days of nitrofurantoin due to immunocompromised status.    Sophia Davis

## 2020-04-10 NOTE — Unmapped (Signed)
Symptom Complaints    ??? Caller: Patient  o Describe the symptom(s): burning with urination  o How severe is it?:yes  o When did you/the patient first notice the symptom(s)?: yesterday  o Has the patient been seen by a physician for the same symptom(s) before?:no  o Acuity: worsening in the last 24 hours of symptoms that have been occurring for a week or less and have not been evaluated by a physician. Patient transferred to triage line and call encounter routed to triage in basket.  ??? Do you/does the patient want to be seen by a physician for this issue?: Does not want to be seen in person unless required by the physician.  o Do you/the patient feel it is an urgent issue?: yes  o Return call to: Patient  o Call back number: [phone] or [phone]  ??? Best time to call back: anytime  Last appt with PCP: 03/27/2020

## 2020-04-10 NOTE — Unmapped (Signed)
[facility] at [phone] asked if nitrofurantoin, macrocrystal-monohydrate, (MACROBID) 100 MG should be for 7 days instead of 5 days.

## 2020-04-10 NOTE — Unmapped (Signed)
I called pharmacy to advise of the same.

## 2020-04-10 NOTE — Unmapped (Signed)
Addended by: Dolores Hoose on: 04/10/2020 12:52 PM     Modules accepted: Orders

## 2020-04-10 NOTE — Unmapped (Signed)
Pt C/O burning with urination. It started sometime, yesterday. Yes, I have pressure, urgency and frequency. No abdominal pain. No blood in my urine.  No back pain.  I'm trying to drink more water. Yes, I'm making urine.    Pt declined to make an appointment, at this time.    Reason for Disposition  ??? Bad or foul-smelling urine    Answer Assessment - Initial Assessment Questions  1. SYMPTOM: What's the main symptom you're concerned about? (e.g., frequency, incontinence)      Burning, pressure, urgency and frequency.    2. ONSET: When did the  *No Answer*  start?      Yesterday.    3. PAIN: Is there any pain? If Yes, ask: How bad is it? (Scale: 1-10; mild, moderate, severe)      No, pain.    No abdominal pain.  No back pain.    4. CAUSE: What do you think is causing the symptoms?      UTI.    5. OTHER SYMPTOMS: Do you have any other symptoms? (e.g., fever, flank pain, blood in urine, pain with urination)      No fever.  No flank pain.  No blood in urine.   No pain with urination.    6. PREGNANCY: Is there any chance you are pregnant? When was your last menstrual period?      N/A. Age.    Protocols used: URINARY [facility]

## 2020-04-10 NOTE — Unmapped (Signed)
Called pt to advise of prescription ordered per Dr. Irena Cords.

## 2020-04-10 NOTE — Unmapped (Signed)
See telephone encounter.

## 2020-04-11 DIAGNOSIS — N3000 Acute cystitis without hematuria: Principal | ICD-10-CM

## 2020-04-11 MED ORDER — NITROFURANTOIN MONOHYDRATE/MACROCRYSTALS 100 MG CAPSULE
ORAL_CAPSULE | Freq: Two times a day (BID) | ORAL | 0 refills | 7.00000 days | Status: CP
Start: 2020-04-11 — End: 2020-04-18

## 2020-04-11 NOTE — Unmapped (Signed)
Pharmacy sent faxed med request, requesting clarification. Request forwarded to Doctor. Laural Benes LPN

## 2020-04-23 NOTE — Unmapped (Signed)
[facility] Clinical Assessment & Refill Coordination Note    Sophia Davis, DOB: [birth year]  Phone: [phone] (home)     All above HIPAA information was verified with patient.     Was a Nurse, learning disability used for this call? No    Specialty Medication(s):   Transplant: Myfortic 180mg  and tacrolimus 0.5mg      Current Outpatient Medications   Medication Sig Dispense Refill   ??? alendronate (FOSAMAX) 70 MG tablet TAKE 1 TABLET (70 MG TOTAL) BY MOUTH EVERY SEVEN (7) DAYS. 12 tablet 4   ??? amLODIPine (NORVASC) 2.5 MG tablet Take 1 tablet (2.5 mg total) by mouth daily. 90 tablet 3   ??? aspirin 81 MG chewable tablet Chew 81 mg daily.     ??? atorvastatin (LIPITOR) 40 MG tablet Take 1 tablet by mouth every evening 90 tablet 3   ??? cholecalciferol, vitamin D3 25 mcg, 1,000 units,, 1,000 unit (25 mcg) tablet Take 1 tablet (25 mcg total) by mouth daily. 90 tablet 3   ??? famotidine (PEPCID) 20 MG tablet TAKE 1 TABLET (20 MG TOTAL) BY MOUTH TWO (2) TIMES A DAY AS NEEDED FOR HEARTBURN. 180 tablet 3   ??? losartan (COZAAR) 50 MG tablet Take 1 tablet (50 mg total) by mouth two (2) times a day. 180 tablet PRN   ??? MYFORTIC 180 mg EC tablet Take 2 tablets (360 mg total) by mouth two (2) times a day. 120 tablet PRN   ??? tacrolimus (PROGRAF) 0.5 MG capsule Take 2 capsules (1 mg total) by mouth two (2) times a day. 120 capsule 11   ??? triamterene-hydrochlorothiazide (DYAZIDE) 37.5-25 mg per capsule Take 1 capsule by mouth every morning. 30 capsule 11     No current facility-administered medications for this visit.        Changes to medications: Sophia Davis reports no changes at this time.    No Known Allergies    Changes to allergies: No    SPECIALTY MEDICATION ADHERENCE     Myfortic 180 mg: 3 days of medicine on hand   Tacrolimus 0.5 mg: 3 days of medicine on hand       Medication Adherence    Adherence tools used: patient uses a pill box to manage medications          Specialty medication(s) dose(s) confirmed: Regimen is correct and unchanged.     Are there any concerns with adherence? No    Adherence counseling provided? Not needed    CLINICAL MANAGEMENT AND INTERVENTION      Clinical Benefit Assessment:    Do you feel the medicine is effective or helping your condition? Yes    Clinical Benefit counseling provided? Not needed    Adverse Effects Assessment:    Are you experiencing any side effects? No    Are you experiencing difficulty administering your medicine? No    Quality of Life Assessment:    How many days over the past month did your kidney transplant  keep you from your normal activities? For example, brushing your teeth or getting up in the morning. 0    Have you discussed this with your provider? Not needed    Therapy Appropriateness:    Is therapy appropriate? Yes, therapy is appropriate and should be continued    DISEASE/MEDICATION-SPECIFIC INFORMATION      N/A    PATIENT SPECIFIC NEEDS     - Does the patient have any physical, cognitive, or cultural barriers? No    - Is  the patient high risk? Yes, patient is taking a REMS drug. Medication is dispensed in compliance with REMS program.     - Does the patient require a Care Management Plan? No     - Does the patient require physician intervention or other additional services (i.e. nutrition, smoking cessation, social work)? No      SHIPPING     Specialty Medication(s) to be Shipped:   Transplant: Myfortic 180mg  and tacrolimus 0.5mg     Other medication(s) to be shipped: atorvastatin, triamterene-hctz     Changes to insurance: No    Delivery Scheduled: Yes, Expected medication delivery date: 04/26/20.     Medication will be delivered via Next Day Courier to the confirmed prescription address in [facility].    The patient will receive a drug information handout for each medication shipped and additional FDA Medication Guides as required.  Verified that patient has previously received a Conservation officer, historic buildings.    All of the patient's questions and concerns have been addressed.    [name]   [facility] Specialty Pharmacist

## 2020-04-24 MED FILL — MYFORTIC 180 MG TABLET,DELAYED RELEASE: 30 days supply | Qty: 120 | Fill #4 | Status: AC

## 2020-04-24 MED FILL — ATORVASTATIN 40 MG TABLET: 90 days supply | Qty: 90 | Fill #2 | Status: AC

## 2020-04-24 MED FILL — ATORVASTATIN 40 MG TABLET: ORAL | 90 days supply | Qty: 90 | Fill #2

## 2020-04-24 MED FILL — TACROLIMUS 0.5 MG CAPSULE, IMMEDIATE-RELEASE: 30 days supply | Qty: 120 | Fill #10 | Status: AC

## 2020-04-24 MED FILL — MYFORTIC 180 MG TABLET,DELAYED RELEASE: ORAL | 30 days supply | Qty: 120 | Fill #4

## 2020-04-24 MED FILL — TRIAMTERENE 37.5 MG-HYDROCHLOROTHIAZIDE 25 MG CAPSULE: 30 days supply | Qty: 30 | Fill #10 | Status: AC

## 2020-04-24 MED FILL — TACROLIMUS 0.5 MG CAPSULE, IMMEDIATE-RELEASE: ORAL | 30 days supply | Qty: 120 | Fill #10

## 2020-04-24 MED FILL — TRIAMTERENE 37.5 MG-HYDROCHLOROTHIAZIDE 25 MG CAPSULE: ORAL | 30 days supply | Qty: 30 | Fill #10

## 2020-05-02 NOTE — Unmapped (Signed)
Pt said, my blood pressures have been looking good. I've been checking it for three weeks. I've been feeling real good.    Blood pressure log:  6/12     BP 123/64  HR 64  7/14 (I hadn't taken my blood pressure pill, yet.) AM BP 136/66  7/14     PM  BP 102/59  HR 72    Today (7/15), BP  124/65  HR 65

## 2020-05-13 MED ORDER — ALENDRONATE 70 MG TABLET
ORAL_TABLET | ORAL | 4 refills | 84.00000 days
Start: 2020-05-13 — End: 2021-05-13

## 2020-05-13 NOTE — Unmapped (Signed)
[facility] Refill Coordination Note    Specialty Medication(s) to be Shipped:   Transplant: Myfortic 180mg  and tacrolimus 1mg     Other medication(s) to be shipped: triamterne-hctz 37.5/25mg      Sophia Davis, DOB: 1946-09-09  Phone: [phone] (home)       All above HIPAA information was verified with patient.     Was a Nurse, learning disability used for this call? No    Completed refill call assessment today to schedule patient's medication shipment from the [facility] [phone]).       Specialty medication(s) and dose(s) confirmed: Regimen is correct and unchanged.   Changes to medications: Sophia Davis reports no changes at this time.  Changes to insurance: No  Questions for the pharmacist: No    Confirmed patient received Welcome Packet with first shipment. The patient will receive a drug information handout for each medication shipped and additional FDA Medication Guides as required.       DISEASE/MEDICATION-SPECIFIC INFORMATION        N/A    SPECIALTY MEDICATION ADHERENCE     Medication Adherence    Patient reported X missed doses in the last month: 0  Specialty Medication: myfortic 180mg   Patient is on additional specialty medications: Yes  Additional Specialty Medications: Tacrolimus 0.5mg   Patient Reported Additional Medication X Missed Doses in the Last Month: 0  Patient is on more than two specialty medications: No  Informant: patient  Reliability of informant: reliable  Patient is at risk for Non-Adherence: No  Adherence tools used: patient uses a pill box to manage medications                myfortic 180 mg: 10 days of medicine on hand   tacrolimus 0.5 mg: 10 days of medicine on hand         SHIPPING     Shipping address confirmed in Epic.     Delivery Scheduled: Yes, Expected medication delivery date: 07/28.     Medication will be delivered via Next Day Courier to the prescription address in Epic WAM.    Sophia Davis   [facility] Specialty Technician

## 2020-05-14 MED FILL — TACROLIMUS 0.5 MG CAPSULE, IMMEDIATE-RELEASE: ORAL | 30 days supply | Qty: 120 | Fill #11

## 2020-05-14 MED FILL — MYFORTIC 180 MG TABLET,DELAYED RELEASE: 30 days supply | Qty: 120 | Fill #5 | Status: AC

## 2020-05-14 MED FILL — MYFORTIC 180 MG TABLET,DELAYED RELEASE: ORAL | 30 days supply | Qty: 120 | Fill #5

## 2020-05-14 MED FILL — TACROLIMUS 0.5 MG CAPSULE, IMMEDIATE-RELEASE: 30 days supply | Qty: 120 | Fill #11 | Status: AC

## 2020-05-14 MED FILL — TRIAMTERENE 37.5 MG-HYDROCHLOROTHIAZIDE 25 MG CAPSULE: 30 days supply | Qty: 30 | Fill #11 | Status: AC

## 2020-05-14 MED FILL — TRIAMTERENE 37.5 MG-HYDROCHLOROTHIAZIDE 25 MG CAPSULE: ORAL | 30 days supply | Qty: 30 | Fill #11

## 2020-05-22 MED ORDER — ALENDRONATE 70 MG TABLET
ORAL_TABLET | ORAL | 2 refills | 84.00000 days | Status: CP
Start: 2020-05-22 — End: 2021-05-22
  Filled 2020-06-19: qty 12, 84d supply, fill #0

## 2020-05-31 NOTE — Unmapped (Signed)
unos form

## 2020-06-06 DIAGNOSIS — Z940 Kidney transplant status: Principal | ICD-10-CM

## 2020-06-06 DIAGNOSIS — D849 Immunodeficiency, unspecified: Principal | ICD-10-CM

## 2020-06-06 MED ORDER — TRIAMTERENE 37.5 MG-HYDROCHLOROTHIAZIDE 25 MG CAPSULE
ORAL_CAPSULE | Freq: Every morning | ORAL | 11 refills | 30.00000 days
Start: 2020-06-06 — End: 2021-06-06

## 2020-06-06 MED ORDER — TACROLIMUS 0.5 MG CAPSULE, IMMEDIATE-RELEASE
ORAL_CAPSULE | Freq: Two times a day (BID) | ORAL | 11 refills | 30.00000 days | Status: CP
Start: 2020-06-06 — End: ?
  Filled 2020-06-19: qty 120, 30d supply, fill #0

## 2020-06-06 NOTE — Unmapped (Signed)
[facility] Refill Coordination Note    Specialty Medication(s) to be Shipped:   Transplant: Myfortic 180mg  and tacrolimus 0.5mg    **Sent rf request for tacrolimus**    Other medication(s) to be shipped: alendronate 70mg , vitamin D and triam/hctz (sent rf request)     Sophia Davis, DOB: [birth year]  Phone: [phone] (home)       All above HIPAA information was verified with patient.     Was a Nurse, learning disability used for this call? No    Completed refill call assessment today to schedule patient's medication shipment from the [facility] [phone]).       Specialty medication(s) and dose(s) confirmed: Regimen is correct and unchanged.   Changes to medications: Viveka reports no changes at this time.  Changes to insurance: No  Questions for the pharmacist: No    Confirmed patient received Welcome Packet with first shipment. The patient will receive a drug information handout for each medication shipped and additional FDA Medication Guides as required.       DISEASE/MEDICATION-SPECIFIC INFORMATION        N/A    SPECIALTY MEDICATION ADHERENCE     Medication Adherence    Patient reported X missed doses in the last month: 0  Specialty Medication: Myfortic 180mg   Patient is on additional specialty medications: Yes  Additional Specialty Medications: Tacrolimus 0.5mg   Patient Reported Additional Medication X Missed Doses in the Last Month: 0  Patient is on more than two specialty medications: No  Adherence tools used: patient uses a pill box to manage medications        Myfortic 180 mg: 14 days of medicine on hand   Tacrolimus 0.5 mg: 14 days of medicine on hand     SHIPPING     Shipping address confirmed in Epic.     Delivery Scheduled: Yes, Expected medication delivery date: 06/20/2020.     Medication will be delivered via Next Day Courier to the prescription address in Epic WAM.    Sophia Davis   [facility] Specialty Technician

## 2020-06-11 MED ORDER — TRIAMTERENE 37.5 MG-HYDROCHLOROTHIAZIDE 25 MG CAPSULE
ORAL_CAPSULE | Freq: Every morning | ORAL | 11 refills | 30 days | Status: CP
Start: 2020-06-11 — End: 2021-06-11
  Filled 2020-06-19: qty 30, 30d supply, fill #0

## 2020-06-19 MED FILL — TRIAMTERENE 37.5 MG-HYDROCHLOROTHIAZIDE 25 MG CAPSULE: 30 days supply | Qty: 30 | Fill #0 | Status: AC

## 2020-06-19 MED FILL — ALENDRONATE 70 MG TABLET: 84 days supply | Qty: 12 | Fill #0 | Status: AC

## 2020-06-19 MED FILL — MYFORTIC 180 MG TABLET,DELAYED RELEASE: 30 days supply | Qty: 120 | Fill #6 | Status: AC

## 2020-06-19 MED FILL — CHOLECALCIFEROL (VITAMIN D3) 25 MCG (1,000 UNIT) TABLET: 100 days supply | Qty: 100 | Fill #1 | Status: AC

## 2020-06-19 MED FILL — CHOLECALCIFEROL (VITAMIN D3) 25 MCG (1,000 UNIT) TABLET: ORAL | 100 days supply | Qty: 100 | Fill #1

## 2020-06-19 MED FILL — TACROLIMUS 0.5 MG CAPSULE, IMMEDIATE-RELEASE: 30 days supply | Qty: 120 | Fill #0 | Status: AC

## 2020-06-19 MED FILL — MYFORTIC 180 MG TABLET,DELAYED RELEASE: ORAL | 30 days supply | Qty: 120 | Fill #6

## 2020-07-01 MED FILL — LOSARTAN 50 MG TABLET: ORAL | 90 days supply | Qty: 180 | Fill #2

## 2020-07-01 MED FILL — AMLODIPINE 2.5 MG TABLET: ORAL | 90 days supply | Qty: 90 | Fill #1

## 2020-07-01 MED FILL — AMLODIPINE 2.5 MG TABLET: 90 days supply | Qty: 90 | Fill #1 | Status: AC

## 2020-07-01 MED FILL — LOSARTAN 50 MG TABLET: 90 days supply | Qty: 180 | Fill #2 | Status: AC

## 2020-07-11 NOTE — Unmapped (Signed)
[facility] Refill Coordination Note    Specialty Medication(s) to be Shipped:   Transplant: Myfortic 180mg  and tacrolimus 0.5mg     Other medication(s) to be shipped: atorvastatin, triamterene-hctz     Sophia Davis, DOB: 16-Mar-1946  Phone: [phone] (home)       All above HIPAA information was verified with patient.     Was a Nurse, learning disability used for this call? No    Completed refill call assessment today to schedule patient's medication shipment from the [facility] [phone]).       Specialty medication(s) and dose(s) confirmed: Regimen is correct and unchanged.   Changes to medications: [name] reports no changes at this time.  Changes to insurance: No  Questions for the pharmacist: No    Confirmed patient received Welcome Packet with first shipment. The patient will receive a drug information handout for each medication shipped and additional FDA Medication Guides as required.       DISEASE/MEDICATION-SPECIFIC INFORMATION        N/A    SPECIALTY MEDICATION ADHERENCE     Medication Adherence    Patient reported X missed doses in the last month: 0  Specialty Medication: Myfortic 180mg   Patient is on additional specialty medications: Yes  Additional Specialty Medications: Tacrolimus 0.5mg   Patient Reported Additional Medication X Missed Doses in the Last Month: 0  Patient is on more than two specialty medications: No  Adherence tools used: patient uses a pill box to manage medications                Myfortic 180 mg: 7 days of medicine on hand   Tacrolimus 0.5 mg: 7 days of medicine on hand         SHIPPING     Shipping address confirmed in Epic.     Delivery Scheduled: Yes, Expected medication delivery date: 07/18/20.     Medication will be delivered via Same Day Courier to the prescription address in Epic WAM.    [name]   [facility] Specialty Pharmacist

## 2020-07-15 DIAGNOSIS — U071 COVID-19: Principal | ICD-10-CM

## 2020-07-15 DIAGNOSIS — Z20822 Close exposure to COVID-19 virus: Principal | ICD-10-CM

## 2020-07-15 NOTE — Unmapped (Signed)
Patient referred for: COVID [facility]    Outcome: Patient declined.    Scheduling Notes: [facility] Test - declined was she wanted testing in [place] - gave commercial testing resources

## 2020-07-15 NOTE — Unmapped (Signed)
Post-Exposure Prophylaxis Questionnaire    Patient was exposed to someone with suspected or confirmed COVID-19 within the last 96 hours: Yes  ??? Exposure: being within 6 feet for a total of 15 minutes or more, caring for someone with confirmed COVID-19 at home, direct physical contact with respiratory droplets from an infected person  ??? Exposure date (mm/dd/yyyy): 07/08/2020    Patient is currently in an outpatient setting: Yes     Patient is at least 75 years of age: Yes     Patient weight is at least 40kg (88 lbs): Yes  ??? Pediatric patients also include weight (lbs) and height: n/a    Patient is unlikely to mount an adequate immune response: Yes - Patient meets the eligibility criteria for monoclonal antibody therapy. Due to your recent COVID-19 exposure,  has the capability to offer monoclonal antibody therapy. The U.S[place] FDA has issued an Emergency Use Authorization to permit the emergency use of the unapproved monoclonal antibodies for the prevention of mild to moderate coronavirus disease 2019 (COVID-19). This therapy may help to prevent development of severe symptoms and/or hospitalization in those who are confirmed to be high-risk COVID exposure and are within 96 hours of exposure. Answered all questions utilizing FAQ sheet. Advised patient that those receiving monoclonal or plasma products should wait 90 days until getting COVID-19 immunized. At this time there are no approved and available alternative treatments for out-patients. Prophylaxis with REGEN-COV is not a substitute for vaccination against COVID-19.  ??? Patient is on dialysis, is a solid organ transplant recipient, has active cancer, or is immunocompromised and/or taking immunosuppressive medication or is not fully vaccinated      Patient declined [place] and therapy due to transportation issue. Provided pt with combat covid help line #.

## 2020-07-15 NOTE — Unmapped (Signed)
Called patient back from her voicemail she Left on my message over the weekend that her son was tested positive for COVID on 9-25, however she was around him last week and he was ill, but not tested until 9-25. And she was concerned she was exposed. She was unable to get COVID test thus far locally and request for local test sent to [facility]. Explained to pt. She will get scheduled for a  [place] covid drive up test and pt. Able to drive for this.   Also sent request for COVID therapies since she is high risk Txp patient and living closely with COVID + son.    Pt. Denies any SOB, denies fevers, denies abnormal coughing.   She does have a slight runny nose  She currently has her taste and smell    Pt. Was vaccinated with J/J vaccine in march.     Provider aware.

## 2020-07-15 NOTE — Unmapped (Signed)
Recent:   What is the date of your last related visit?    Related acute medications Rx'd:    Home treatment tried:    COVID Vaccine:     Relevant:   Allergies: Patient has no known allergies.  Medications:   Health History:   Weight:     07/15/20    Patient is calling because they: are interested in infusion therapy.     Patient is experiencing symptoms severe enough that they would like to speak with a nurse? No    Patient is interested in infusion therapy. Advised I can send your request to our therapy coordinators who will call you back to determine your eligibility and discuss any questions you may have. Our therapy coordinators are working hard to process requests and contact as many patients as they can each day. You may receive a call back shortly or in a day or two. To avoid delays: Please answer calls from unfamiliar numbers, ensure your phone is set up to accept/receive voicemails and call back if you haven't received a call in 48 hours. Submitted order for Request for COVID Therapeutics.    The call was resolved in the following manner: Encounter routed to P [facility] pool          Reason for Disposition  ??? General information question, no triage required and triager able to answer question    Answer Assessment - Initial Assessment Questions  1. REASON FOR CALL or QUESTION: What is your reason for calling today? or How can I best help you? or What question do you have that I can help answer?      Pt had initially declined having the MAB infusion for PEP due to transportation issues. Pt received a call from her PCP encouraging her to get the infusion due to having a kidney transplant. Pt is calling back and is requesting the MAB infusion for PEP and is willing to travel to [facility]. [place] [facility] address given to pt. Pt has not received her COVID results as of yet.    Protocols used: INFORMATION ONLY CALL - NO TRIAGE-A-AH

## 2020-07-15 NOTE — Unmapped (Signed)
Pt. Voiced that she is getting a COVID test today locally   She has the phone # for the [facility] infusion schedulers and was trying to schedule but may have some barriers d/t transportation.     I encouraged her to do all she can to get transportation arranged. She verbalizes understanding.

## 2020-07-16 DIAGNOSIS — Z79899 Other long term (current) drug therapy: Principal | ICD-10-CM

## 2020-07-16 DIAGNOSIS — Z940 Kidney transplant status: Principal | ICD-10-CM

## 2020-07-16 DIAGNOSIS — U071 COVID-19: Principal | ICD-10-CM

## 2020-07-16 DIAGNOSIS — D849 Immunodeficiency, unspecified: Principal | ICD-10-CM

## 2020-07-16 NOTE — Unmapped (Signed)
Patient called back and changed her mind. Appt scheduled for [place]

## 2020-07-16 NOTE — Unmapped (Signed)
Pt called to confirm she is getting her [facility] covid antibody infusion 9-29    Pt. Unable to come 10-01   Due to possibly having covid or not.    Pt. Knows this patient will be rescheduled.

## 2020-07-16 NOTE — Unmapped (Signed)
07/16/20    Patient is calling because they: are interested in infusion therapy.     Patient is experiencing symptoms severe enough that they would like to speak with a nurse? No    Patient missed call to schedule. Advised I'm sorry you missed the call to schedule your COVID therapy appointment. We will let the therapy coordinators know that you've returned the call so that they can reach out to you again. To avoid further delays: Please answer calls from unfamiliar numbers and ensure your phone is set up to accept/receive voicemails. Routed Telephone Encounter to P [facility] pool.    The call was resolved in the following manner: Encounter routed to P [facility] pool

## 2020-07-16 NOTE — Unmapped (Signed)
07/16/20    Patient is calling because they: haven't received a call yet to schedule their therapy appointment from the MAB coordinator. Originally declined, but now wants therapy     Patient is experiencing symptoms severe enough that they would like to speak with a nurse? No    Patient is interested in infusion therapy. Advised I can send your request to our therapy coordinators who will call you back to determine your eligibility and discuss any questions you may have. Our therapy coordinators are working hard to process requests and contact as many patients as they can each day. You may receive a call back shortly or in a day or two. To avoid delays: Please answer calls from unfamiliar numbers, ensure your phone is set up to accept/receive voicemails and call back if you haven't received a call in 48 hours. Submitted order for Request for COVID Therapeutics.    The call was resolved in the following manner: Order submitted for Request for COVID Therapeutics

## 2020-07-17 ENCOUNTER — Ambulatory Visit: Admit: 2020-07-17 | Discharge: 2020-07-17 | Payer: MEDICARE

## 2020-07-17 DIAGNOSIS — U071 COVID-19: Principal | ICD-10-CM

## 2020-07-17 MED ADMIN — casirivimab-imdevimab co-formulated 600 mg in sodium chloride (NS) 0.9% 110 mL IVPB premade: 600 mg | INTRAVENOUS | @ 14:00:00 | Stop: 2020-07-17

## 2020-07-17 NOTE — Unmapped (Signed)
Post Infusion Contacts    After your Regeneron therapy, you can contact your primary care provider or our Clinical Contact Center [phone] for minor symptoms.   For severe symptoms, please dial 911.     A [facility] nurse will be calling you to check in tomorrow as well.     Those receiving monoclonal or plasma products should wait 90 days until getting immunized.     Thank you for choosing [facility] at [facility]     FACT SHEET FOR PATIENTS, PARENTS AND CAREGIVERS     EMERGENCY USE AUTHORIZATION (EUA) OF CASIRIVIMAB AND IMDEVIMAB FOR CORONAVIRUS DISEASE 2019   (COVID-19)     You are being given a medicine called casirivimab and imdevimab for the treatment of coronavirus disease 2019 (COVID-19). This Fact Sheet contains information to help you understand the potential risks and potential benefits of taking casirivimab and imdevimab, which you may receive.   Receiving casirivimab and imdevimab may benefit certain people with COVID-19.   Read this Fact Sheet for information about casirivimab and imdevimab. Talk to your healthcare provider if you have questions. It is your choice to receive casirivimab and imdevimab or stop at any time.       WHAT IS COVID-19?     COVID-19 is caused by a virus called a coronavirus. People can get COVID-19 through contact with another person who has the virus.   COVID-19 illnesses have ranged from very mild (including some with no reported symptoms) to severe, including illness resulting in death. While information so far suggests that most COVID-19 illness is mild, serious illness can occur and may cause some of your other medical conditions to become worse. People of all ages with severe, long-lasting (chronic) medical conditions like heart disease, lung disease, and diabetes, for example, seem to be at higher risk of being hospitalized for COVID-19.     WHAT ARE THE SYMPTOMS OF COVID-19?   The symptoms of COVID-19 include fever, cough, and shortness of breath, which may appear 2 to 14 days after exposure. Serious illness including breathing problems can occur and may cause your other medical conditions to become worse.     WHAT IS CASIRIVIMAB AND IMDEVIMAB?   Casirivimab and imdevimab are investigational medicines used to treat mild to moderate symptoms of COVID-19 in non-hospitalized adults and adolescents (28 years of age and older who weigh at least 88 pounds (40 kg)), and who are at high risk for developing severe COVID-19 symptoms or the need for hospitalization. Casirivimab and imdevimab are investigational because they are still being studied. There is limited information known about the safety and effectiveness of using casirivimab and imdevimab to treat people with COVID-19.   The FDA has authorized the emergency use of casirivimab and imdevimab for the treatment of COVID-19 under an Emergency Use Authorization (EUA). For more information on EUA, see the What is an Emergency Use Authorization (EUA)? section at the end of this Fact Sheet.  Page 2 of 3     WHAT SHOULD I TELL MY HEALTH CARE PROVIDER BEFORE I RECEIVE CASIRIVIMAB AND IMDEVIMAB?   Tell your healthcare provider about all of your medical conditions, including if you:   ??? Have any allergies   ??? Are pregnant or plan to become pregnant   ??? Are breastfeeding or plan to breastfeed   ??? Have any serious illnesses   ??? Are taking any medications (prescription, over-the-counter, vitamins, and herbal products)     HOW WILL I RECEIVE CASIRIVIMAB  AND IMDEVIMAB?   ??? Casirivimab and imdevimab are two investigational medicines given together as a single intravenous infusion (through a vein) for at least 1 hour.   ??? You will receive one dose of casirivimab and imdevimab by intravenous infusion.     WHAT ARE THE IMPORTANT POSSIBLE SIDE EFFECTS OF CASIRIVIMAB AND IMDEVIMAB?   Possible side effects of casirivimab and imdevimab are:   ??? Allergic reactions. Allergic reactions can happen during and after infusion with casirivimab and imdevimab. Tell your healthcare provider or nurse, or get medical help right away if you get any of the following signs and symptoms of allergic reactions: fever, chills, low blood pressure, changes in your heartbeat, shortness of breath, wheezing, swelling of your lips, face, or throat, rash including hives, itching, headache, nausea, vomiting, sweating, muscle aches, dizziness and shivering.     The side effects of getting any medicine by vein may include brief pain, bleeding, bruising of the skin, soreness, swelling, and possible infection at the infusion site.   These are not all the possible side effects of casirivimab and imdevimab. Not a lot of people have been given casirivimab and imdevimab. Serious and unexpected side effects may happen. Casirivimab and imdevimab are still being studied so it is possible that all of the risks are not known at this time.   It is possible that casirivimab and imdevimab could interfere with your body's own ability to fight off a future infection of SARS-CoV-2. Similarly, casirivimab and imdevimab may reduce your body's immune response to a vaccine for SARS-CoV-2. Specific studies have not been conducted to address these possible risks. Talk to your healthcare provider if you have any questions.     WHAT OTHER TREATMENT CHOICES ARE THERE?     Like casirivimab and imdevimab, FDA may allow for the emergency use of other medicines to treat people with COVID-19. Go to UKRank.es for information on other medicines used to treat people with COVID-19.   It is your choice to be treated or not to be treated with casirivimab and imdevimab. Should you decide not to receive casirivimab and imdevimab or stop it at any time, it will not change your standard medical care.  Page 3 of 3     WHAT IF I AM PREGNANT OR BREASTFEEDING?     There is limited experience treating pregnant women or breastfeeding mothers with casirivimab and imdevimab. For a mother and unborn baby, the benefit of receiving casirivimab and imdevimab may be greater than the risk from the treatment. If you are pregnant or breastfeeding, discuss your options and specific situation with your healthcare provider.     HOW DO I REPORT SIDE EFFECTSWITH CASIRIVIMAB AND IMDEVIMAB?     Tell your healthcare provider right away if you have any side effect that bothers you or does not go away.   Report side effects to FDA MedWatch at Mac[employer]reat.be or call 1-800-FDA-1088 or call 8644399438.     HOW CAN I LEARN MORE?     ??? Ask your health care provider.   ??? Visit www.ContactTape.nl   ??? Visit UKRank.es   ??? Contact your local or state public health department.     WHAT IS AN EMERGENCY USE AUTHORIZATION (EUA)?     The [place] FDA has made casirivimab and imdevimab available under an emergency access mechanism called an EUA. The EUA is supported by a Surveyor, minerals and Human Service (HHS) declaration that circumstances exist to justify the emergency use of drugs and biological products  during the COVID-19 pandemic.   Casirivimab and imdevimab have not undergone the same type of review as an FDA-approved or cleared product. The FDA may issue an EUA when certain criteria are met, which includes that there are no adequate, approved, available alternatives. In addition, the FDA decision is based on the totality of scientific evidence available showing that it is reasonable to believe that the product meets certain criteria for safety, performance, and labeling and may be effective in treatment of patients during the COVID-19 pandemic. All of these criteria must be met to allow for the product to be used in the treatment of patients during the COVID-19 pandemic.   The EUA for casirivimab and imdevimab is in effect for the duration of the COVID-19 declaration justifying emergency use of these products, unless terminated or revoked (after which the products may no longer be used).       YOUR HEALTH  When You Can be Around Others    Updated Dec. 1, 2020  Facebook Twitter LinkedIn Syndicate  If you have or think you might have COVID-19, it is important to stay home and away from other people. Staying away from others helps stop the spread of COVID-19. If you have an emergency warning sign (including trouble breathing), get emergency medical care immediately.    I think or know I had COVID-19, and I had symptoms  You can be around others after:    10 days since symptoms first appeared and  24 hours with no fever without the use of fever-reducing medications and  Other symptoms of COVID-19 are improving*  *Loss of taste and smell may persist for weeks or months after recovery and need not delay the end of isolation    Most people do not require testing to decide when they can be around others; however, if your healthcare provider recommends testing, they will let you know when you can resume being around others based on your test results.    Note that these recommendations do not apply to persons with severe COVID-19 or with severely weakened immune systems (immunocompromised). These persons should follow the guidance below for ???I was severely ill with COVID-19 or have a severely weakened immune system (immunocompromised) due to a health condition or medication. When can I be around others????    I tested positive for COVID-19 but had no symptoms  If you continue to have no symptoms, you can be with others after 10 days have passed since you had a positive viral test for COVID-19. Most people do not require testing to decide when they can be around others; however, if your healthcare provider recommends testing, they will let you know when you can resume being around others based on your test results.    If you develop symptoms after testing positive, follow the guidance above for ???I think or know I had COVID-19, and I had symptoms.???    I was severely ill with COVID-19 or have a severely weakened immune system (immunocompromised) due to a health condition or medication.     When can I be around others?  People who are severely ill with COVID-19 might need to stay home longer than 10 days and up to 20 days after symptoms first appeared. Persons who are severely immunocompromised may require testing to determine when they can be around others. Talk to your healthcare provider for more information. If testing is available in your community, it may be recommended by your healthcare provider. Your healthcare provider will let  you know if you can resume being around other people based on the results of your testing.    Your doctor may work with an infectious disease expert or your local health department to determine whether testing will be necessary before you can be around others.      ]Anyone who has had close contact with someone with COVID-19 should stay home for 14 days after their last exposure to that person.    The best way to protect yourself and others is to stay home for 14 days if you think you???ve been exposed to someone who has COVID-19. Check your local health department???s website for information about options in your area to possibly shorten this quarantine period.  However, anyone who has had close contact with someone with COVID-19 and who meets the following criteria does NOT need to stay home.    Has COVID-19 illness within the previous 3 months and  Has recovered and  Remains without COVID-19 symptoms (for example, cough, shortness of breath)  Confirmed and suspected cases of reinfection of the virus that causes COVID-19  Cases of reinfection of COVID-19 have been reported but are rare. In general, reinfection means a person was infected (got sick) once, recovered, and then later became infected again. Based on what we know from similar viruses, some reinfections are expected.

## 2020-07-17 NOTE — Unmapped (Signed)
0950 Pt arrived for Regeneron for COVID.     0948 Regeneron 1200 mg IV infusing over 21 minutes via PIV.    1015 Regeneron infusion completed.     1115 1 hr monitoring completed.Flushed with NS. PIV removed. Reviewed AVS with pt. Reverbalized understanding.  Dc'd from Infusion Center.

## 2020-07-18 MED FILL — TRIAMTERENE 37.5 MG-HYDROCHLOROTHIAZIDE 25 MG CAPSULE: ORAL | 30 days supply | Qty: 30 | Fill #1

## 2020-07-18 MED FILL — MYFORTIC 180 MG TABLET,DELAYED RELEASE: 30 days supply | Qty: 120 | Fill #7 | Status: AC

## 2020-07-18 MED FILL — TRIAMTERENE 37.5 MG-HYDROCHLOROTHIAZIDE 25 MG CAPSULE: 30 days supply | Qty: 30 | Fill #1 | Status: AC

## 2020-07-18 MED FILL — ATORVASTATIN 40 MG TABLET: ORAL | 90 days supply | Qty: 90 | Fill #3

## 2020-07-18 MED FILL — ATORVASTATIN 40 MG TABLET: 90 days supply | Qty: 90 | Fill #3 | Status: AC

## 2020-07-18 MED FILL — TACROLIMUS 0.5 MG CAPSULE, IMMEDIATE-RELEASE: ORAL | 30 days supply | Qty: 120 | Fill #1

## 2020-07-18 MED FILL — MYFORTIC 180 MG TABLET,DELAYED RELEASE: ORAL | 30 days supply | Qty: 120 | Fill #7

## 2020-07-18 MED FILL — TACROLIMUS 0.5 MG CAPSULE, IMMEDIATE-RELEASE: 30 days supply | Qty: 120 | Fill #1 | Status: AC

## 2020-07-19 ENCOUNTER — Ambulatory Visit: Admit: 2020-07-19 | Discharge: 2020-07-19 | Payer: MEDICARE

## 2020-07-19 ENCOUNTER — Telehealth: Admit: 2020-07-19 | Discharge: 2020-07-19 | Payer: MEDICARE | Attending: Nephrology | Primary: Nephrology

## 2020-07-22 NOTE — Unmapped (Signed)
left message, called to schedule 4 month follow up with Dr. Toni Arthurs, can be scheduled from the recall tab

## 2020-07-26 NOTE — Unmapped (Signed)
[facility] [facility] transplant nephrology telemedicine visit    assessment and plan  1. s/p kidney transplant 05/17/13. baseline creatinine 1-1.2 mg/dl. no proteinuria. no donor specific hla ab detected. +repeat labs to monitor kidney function and therapeutic drug lvls encouraged.  2. immunosuppression. mycophenolate sodium on hold x2 weeks d/t covid-19 infection concern. tacrolimus 12hr lvl 4-7 ng/ml.  3. hypertension. blood pressure goal < 130/80 mmhg.  4. health maintenance. influenza '20. pcv13 pneumococcal '14. ppsv23 pneumococcal '16. zoster '19. covid-19 janssen vaccine '21. mammogram '21. gyn exam '13/post hysterectomy. cystoscopy '14. colonoscopy '20. dermatology appt '21. kidney ultrasound '20.    I spent 12 minutes on the phone with the patient on the date of service. I spent an additional 15 minutes on pre- and post-visit activities on the date of service.     The patient was physically located in [place] or a state in which I am permitted to provide care. The patient and/or parent/guardian understood that s/he may incur co-pays and cost sharing, and agreed to the telemedicine visit. The visit was reasonable and appropriate under the circumstances given the patient's presentation at the time.    The patient and/or parent/guardian has been advised of the potential risks and limitations of this mode of treatment (including, but not limited to, the absence of in-person examination) and has agreed to be treated using telemedicine. The patient's/patient's family's questions regarding telemedicine have been answered.     If the visit was completed in an ambulatory setting, the patient and/or parent/guardian has also been advised to contact their provider???s office for worsening conditions, and seek emergency medical treatment and/or call 911 if the patient deems either necessary.    history of present illness    [name] is a 74 year old woman who is seen in follow up post kidney transplant 05/17/2013. son tested covid+ 09/25. she had significant exposure with him. home covid test negative. mycophenolate held 9/27+ and she received monoclonal ab infusion 9/29. she feels ok. +mild fatigue and decreased appetite. no change in smell/taste. no fevers chills or sweats. no headache or lightheaded. no chest pain cough or shortness of breath. no lower extremity edema. no abdominal pain no n/v/d. no myalgias or arthralgias. no dysuria hematuria or difficulty voiding. all other systems reviewed and negative x10 systems.    past medical hx:   1. s/p deceased donor kidney transplant 05/17/2013. mpo-anca+ chronic gn. alemtuzumab induction. baseline creatinine 1-1.2 mg/dl.  2. mpo-anca+ vasculitis/granulomatosis with polyangiitis. hx pulmonary hemorrhage. trtmt plasma exchange, iv methylprednisolone, iv/po cyclophosphamide x total 57m '03-'07; rituximab 1gm x2 '07.  3. hypertension   4. coronary artery disease s/p pci/stent '97. nl cards stress '13: lvef > 65%. no myocardial ischemia   5. hyperlipidemia  6. osteoporosis  7. secondary/tertiary hyperparathyroidism    past surgical hx: total abdominal hysterectomy '98. 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 kidney transplant '14. right hand 3rd/4th trigger finger release '16.    allergies: nkda     medications: tacrolimus 1mg  bid, mycophenolate sodium 360mg  bid/on hold, losartan 50mg  bid, amlodipine 2.5mg  q.pm, triamterene/hydrochlorothiazide 37.5/25mg  daily, atorvastatin 40mg  daily, aspirin 81mg  daily, vitamin d 1,000u daily, famotidine 20mg  prn, alendronate 70mg  q.week.    soc hx: widowed x3 children. distant smoking hx; none > 40 yrs.    labs 03/27/20: wbc7.2 hgb11.7 hct36.7 plt248. na130 k3.9 cl97 bicarb25 bun17 cr1.1 glc108 ca9.8 albumin4.1 liver function panel nl. tacrolimus lvl 4 ng.ml. cmv/bkv pcr not detected.

## 2020-08-09 NOTE — Unmapped (Signed)
[facility] Refill Coordination Note    Specialty Medication(s) to be Shipped:   Transplant: Myfortic 180mg  and tacrolimus 0.5mg     Other medication(s) to be shipped: triamterene/hctz     Sophia Davis, DOB: [birth year]  Phone: [phone] (home)       All above HIPAA information was verified with patient.     Was a Nurse, learning disability used for this call? No    Completed refill call assessment today to schedule patient's medication shipment from the [facility] [phone]).       Specialty medication(s) and dose(s) confirmed: Regimen is correct and unchanged.   Changes to medications: Sophia Davis reports no changes at this time.  Changes to insurance: No  Questions for the pharmacist: No    Confirmed patient received Welcome Packet with first shipment. The patient will receive a drug information handout for each medication shipped and additional FDA Medication Guides as required.       DISEASE/MEDICATION-SPECIFIC INFORMATION        N/A    SPECIALTY MEDICATION ADHERENCE     Medication Adherence    Patient reported X missed doses in the last month: 0  Specialty Medication: Myfortic 180mg   Patient is on additional specialty medications: Yes  Additional Specialty Medications: Tacrolimus 0.5mg   Patient Reported Additional Medication X Missed Doses in the Last Month: 0  Patient is on more than two specialty medications: No  Adherence tools used: patient uses a pill box to manage medications                Tacrolimus 0.5 mg: 13 days of medicine on hand   Myfortic 180 mg: 13 days of medicine on hand *    SHIPPING     Shipping address confirmed in Epic.     Delivery Scheduled: Yes, Expected medication delivery date: 08/20/20.     Medication will be delivered via Same Day Courier to the prescription address in Epic WAM.    [name]   [facility] Specialty Pharmacist

## 2020-08-20 MED FILL — MYFORTIC 180 MG TABLET,DELAYED RELEASE: 30 days supply | Qty: 120 | Fill #8 | Status: AC

## 2020-08-20 MED FILL — MYFORTIC 180 MG TABLET,DELAYED RELEASE: ORAL | 30 days supply | Qty: 120 | Fill #8

## 2020-08-20 MED FILL — TACROLIMUS 0.5 MG CAPSULE, IMMEDIATE-RELEASE: ORAL | 30 days supply | Qty: 120 | Fill #2

## 2020-08-20 MED FILL — TRIAMTERENE 37.5 MG-HYDROCHLOROTHIAZIDE 25 MG CAPSULE: ORAL | 30 days supply | Qty: 30 | Fill #2

## 2020-08-20 MED FILL — TACROLIMUS 0.5 MG CAPSULE, IMMEDIATE-RELEASE: 30 days supply | Qty: 120 | Fill #2 | Status: AC

## 2020-08-20 MED FILL — TRIAMTERENE 37.5 MG-HYDROCHLOROTHIAZIDE 25 MG CAPSULE: 30 days supply | Qty: 30 | Fill #2 | Status: AC

## 2020-09-10 NOTE — Unmapped (Signed)
[facility] Refill Coordination Note    Specialty Medication(s) to be Shipped:   Transplant: Myfortic 180mg  and tacrolimus 0.5mg     Other medication(s) to be shipped:      Alendronate  Vitamin D3  Dyazide       Sophia Davis, DOB: [birth year]  Phone: [phone] (home)       All above HIPAA information was verified with patient.     Was a Nurse, learning disability used for this call? No    Completed refill call assessment today to schedule patient's medication shipment from the [facility] [phone]).       Specialty medication(s) and dose(s) confirmed: Regimen is correct and unchanged.   Changes to medications: Sophia Davis reports no changes at this time.  Changes to insurance: No  Questions for the pharmacist: No    Confirmed patient received Welcome Packet with first shipment. The patient will receive a drug information handout for each medication shipped and additional FDA Medication Guides as required.       DISEASE/MEDICATION-SPECIFIC INFORMATION        N/A    SPECIALTY MEDICATION ADHERENCE     Medication Adherence    Patient reported X missed doses in the last month: 0  Adherence tools used: patient uses a pill box to manage medications        Tacrolimus 0.5 mg: 12 days of medicine on hand   Myfortic 180 mg: 12 days of medicine on hand             SHIPPING     Shipping address confirmed in Epic.     Delivery Scheduled: Yes, Expected medication delivery date: 09/17/20.     Medication will be delivered via UPS to the prescription address in Epic WAM.    Sophia Davis   [facility] Specialty Technician

## 2020-09-16 MED FILL — AMLODIPINE 2.5 MG TABLET: ORAL | 90 days supply | Qty: 90 | Fill #2

## 2020-09-16 MED FILL — ALENDRONATE 70 MG TABLET: 84 days supply | Qty: 12 | Fill #1 | Status: AC

## 2020-09-16 MED FILL — MYFORTIC 180 MG TABLET,DELAYED RELEASE: 30 days supply | Qty: 120 | Fill #9 | Status: AC

## 2020-09-16 MED FILL — CHOLECALCIFEROL (VITAMIN D3) 25 MCG (1,000 UNIT) TABLET: 100 days supply | Qty: 100 | Fill #2 | Status: AC

## 2020-09-16 MED FILL — TACROLIMUS 0.5 MG CAPSULE, IMMEDIATE-RELEASE: 30 days supply | Qty: 120 | Fill #3 | Status: AC

## 2020-09-16 MED FILL — AMLODIPINE 2.5 MG TABLET: 90 days supply | Qty: 90 | Fill #2 | Status: AC

## 2020-09-16 MED FILL — LOSARTAN 50 MG TABLET: ORAL | 90 days supply | Qty: 180 | Fill #3

## 2020-09-16 MED FILL — CHOLECALCIFEROL (VITAMIN D3) 25 MCG (1,000 UNIT) TABLET: ORAL | 100 days supply | Qty: 100 | Fill #2

## 2020-09-16 MED FILL — TRIAMTERENE 37.5 MG-HYDROCHLOROTHIAZIDE 25 MG CAPSULE: ORAL | 30 days supply | Qty: 30 | Fill #3

## 2020-09-16 MED FILL — LOSARTAN 50 MG TABLET: 90 days supply | Qty: 180 | Fill #3 | Status: AC

## 2020-09-16 MED FILL — TRIAMTERENE 37.5 MG-HYDROCHLOROTHIAZIDE 25 MG CAPSULE: 30 days supply | Qty: 30 | Fill #3 | Status: AC

## 2020-09-16 MED FILL — ALENDRONATE 70 MG TABLET: ORAL | 84 days supply | Qty: 12 | Fill #1

## 2020-09-16 MED FILL — TACROLIMUS 0.5 MG CAPSULE, IMMEDIATE-RELEASE: ORAL | 30 days supply | Qty: 120 | Fill #3

## 2020-09-16 MED FILL — MYFORTIC 180 MG TABLET,DELAYED RELEASE: ORAL | 30 days supply | Qty: 120 | Fill #9

## 2020-09-23 NOTE — Unmapped (Signed)
[facility] Clinical Assessment & Refill Coordination Note    YANESSA HOCEVAR, DOB: 08-May-1946  Phone: [phone] (home)     All above HIPAA information was verified with patient.     Was a Nurse, learning disability used for this call? No    Specialty Medication(s):   Transplant: Myfortic 180mg  and tacrolimus 0.5mg      Current Outpatient Medications   Medication Sig Dispense Refill   ??? alendronate (FOSAMAX) 70 MG tablet Take 1 tablet (70 mg total) by mouth every seven (7) days. 12 tablet 2   ??? amLODIPine (NORVASC) 2.5 MG tablet Take 1 tablet (2.5 mg total) by mouth daily. 90 tablet 3   ??? aspirin 81 MG chewable tablet Chew 81 mg daily.     ??? atorvastatin (LIPITOR) 40 MG tablet Take 1 tablet by mouth every evening 90 tablet 3   ??? cholecalciferol, vitamin D3 25 mcg, 1,000 units,, 1,000 unit (25 mcg) tablet Take 1 tablet (25 mcg total) by mouth daily. 90 tablet 3   ??? famotidine (PEPCID) 20 MG tablet TAKE 1 TABLET (20 MG TOTAL) BY MOUTH TWO (2) TIMES A DAY AS NEEDED FOR HEARTBURN. 180 tablet 3   ??? losartan (COZAAR) 50 MG tablet Take 1 tablet (50 mg total) by mouth two (2) times a day. 180 tablet PRN   ??? MYFORTIC 180 mg EC tablet Take 2 tablets (360 mg total) by mouth two (2) times a day. 120 tablet PRN   ??? tacrolimus (PROGRAF) 0.5 MG capsule Take 2 capsules (1 mg total) by mouth two (2) times a day. 120 capsule 11   ??? triamterene-hydrochlorothiazide (DYAZIDE) 37.5-25 mg per capsule Take 1 capsule by mouth every morning. 30 capsule 11     No current facility-administered medications for this visit.        Changes to medications: Roena reports no changes at this time.    No Known Allergies    Changes to allergies: No    SPECIALTY MEDICATION ADHERENCE     Myfortic 180 mg: 21 days of medicine on hand   Tacrolimus 0.5 mg: 21 days of medicine on hand       Medication Adherence    Patient reported X missed doses in the last month: 0  Specialty Medication: Myfortic 180mg   Patient is on additional specialty medications: Yes  Additional Specialty Medications: Tacrolimus 0.5mg   Patient Reported Additional Medication X Missed Doses in the Last Month: 0  Patient is on more than two specialty medications: No  Adherence tools used: patient uses a pill box to manage medications          Specialty medication(s) dose(s) confirmed: Regimen is correct and unchanged.     Are there any concerns with adherence? No    Adherence counseling provided? Not needed    CLINICAL MANAGEMENT AND INTERVENTION      Clinical Benefit Assessment:    Do you feel the medicine is effective or helping your condition? Yes    Clinical Benefit counseling provided? Not needed    Adverse Effects Assessment:    Are you experiencing any side effects? No    Are you experiencing difficulty administering your medicine? No    Quality of Life Assessment:    How many days over the past month did your kidney transplant  keep you from your normal activities? For example, brushing your teeth or getting up in the morning. 0    Have you discussed this with your provider? Yes    Therapy Appropriateness:  Is therapy appropriate? Yes, therapy is appropriate and should be continued    DISEASE/MEDICATION-SPECIFIC INFORMATION      N/A    PATIENT SPECIFIC NEEDS     - Does the patient have any physical, cognitive, or cultural barriers? No    - Is the patient high risk? Yes, patient is taking a REMS drug. Medication is dispensed in compliance with REMS program    - Does the patient require a Care Management Plan? No     - Does the patient require physician intervention or other additional services (i.e. nutrition, smoking cessation, social work)? No      SHIPPING     Specialty Medication(s) to be Shipped:   Transplant: None- patient declined refills on myfortic and tacrolimus today.    Other medication(s) to be shipped: No additional medications requested for fill at this time     Changes to insurance: No    Delivery Scheduled: Patient declined refill at this time due to has 3 weeks of medications on hand..     Medication will be delivered via UPS to the confirmed prescription address in [facility].    The patient will receive a drug information handout for each medication shipped and additional FDA Medication Guides as required.  Verified that patient has previously received a Conservation officer, historic buildings.    All of the patient's questions and concerns have been addressed.    [name]   [facility] Specialty Pharmacist

## 2020-10-01 DIAGNOSIS — Z940 Kidney transplant status: Principal | ICD-10-CM

## 2020-10-01 DIAGNOSIS — Z79899 Other long term (current) drug therapy: Principal | ICD-10-CM

## 2020-10-01 NOTE — Unmapped (Signed)
Confirmed with pt. Her [place] appt is set and that she will get labs and her [facility] is [facility]  No other concerns at this time.   Reminded pt. To hold her IS until after labs.

## 2020-10-01 NOTE — Unmapped (Signed)
Labs entered due to pt coming to [facility] tomorrow for imaging. Pt was called and asked to stop at labs for blood draw, she state that she will.

## 2020-10-02 ENCOUNTER — Ambulatory Visit: Admit: 2020-10-02 | Discharge: 2020-10-02 | Payer: MEDICARE

## 2020-10-02 DIAGNOSIS — N1832 Stage 3b chronic kidney disease (CMS-HCC): Principal | ICD-10-CM

## 2020-10-02 DIAGNOSIS — Z4822 Encounter for aftercare following kidney transplant: Principal | ICD-10-CM

## 2020-10-02 DIAGNOSIS — Z79899 Other long term (current) drug therapy: Principal | ICD-10-CM

## 2020-10-02 DIAGNOSIS — Z940 Kidney transplant status: Principal | ICD-10-CM

## 2020-10-02 DIAGNOSIS — I10 Essential (primary) hypertension: Principal | ICD-10-CM

## 2020-10-02 DIAGNOSIS — D849 Immunodeficiency, unspecified: Principal | ICD-10-CM

## 2020-10-02 LAB — URINALYSIS
BILIRUBIN UA: NEGATIVE
BLOOD UA: NEGATIVE
GLUCOSE UA: NEGATIVE
HYALINE CASTS: 15 /LPF — ABNORMAL HIGH (ref 0–1)
KETONES UA: NEGATIVE
LEUKOCYTE ESTERASE UA: NEGATIVE
NITRITE UA: NEGATIVE
PH UA: 6 (ref 5.0–9.0)
PROTEIN UA: NEGATIVE
RBC UA: <1 /HPF (ref <4)
SPECIFIC GRAVITY UA: 1.02 (ref 1.005–1.030)
SQUAMOUS EPITHELIAL: 9 /HPF — ABNORMAL HIGH (ref 0–5)
TRANSITIONAL EPITHELIAL: 2 /HPF (ref 0–2)
UROBILINOGEN UA: 0.2
WBC UA: 13 /HPF — ABNORMAL HIGH (ref 0–5)

## 2020-10-02 LAB — HEMOGLOBIN A1C
ESTIMATED AVERAGE GLUCOSE: 108 mg/dL
HEMOGLOBIN A1C: 5.4 % (ref 4.8–5.6)

## 2020-10-02 LAB — COMPREHENSIVE METABOLIC PANEL
ALBUMIN: 4.1 g/dL (ref 3.4–5.0)
ALKALINE PHOSPHATASE: 83 U/L (ref 46–116)
ALT (SGPT): 9 U/L — ABNORMAL LOW (ref 10–49)
ANION GAP: 8 mmol/L (ref 5–14)
AST (SGOT): 17 U/L (ref <=34)
BILIRUBIN TOTAL: 0.8 mg/dL (ref 0.3–1.2)
BLOOD UREA NITROGEN: 18 mg/dL (ref 9–23)
BUN / CREAT RATIO: 14
CALCIUM: 10.1 mg/dL (ref 8.7–10.4)
CHLORIDE: 101 mmol/L (ref 98–107)
CO2: 26.9 mmol/L (ref 20.0–31.0)
CREATININE: 1.25 mg/dL — ABNORMAL HIGH
EGFR CKD-EPI AA FEMALE: 49 mL/min/{1.73_m2} — ABNORMAL LOW (ref >=60)
EGFR CKD-EPI NON-AA FEMALE: 42 mL/min/{1.73_m2} — ABNORMAL LOW (ref >=60)
GLUCOSE RANDOM: 95 mg/dL (ref 70–99)
POTASSIUM: 3.2 mmol/L — ABNORMAL LOW (ref 3.4–4.5)
PROTEIN TOTAL: 6.4 g/dL (ref 5.7–8.2)
SODIUM: 136 mmol/L (ref 135–145)

## 2020-10-02 LAB — CBC W/ AUTO DIFF
BASOPHILS ABSOLUTE COUNT: 0 10*9/L (ref 0.0–0.1)
BASOPHILS RELATIVE PERCENT: 0.6 %
EOSINOPHILS ABSOLUTE COUNT: 0.1 10*9/L (ref 0.0–0.7)
EOSINOPHILS RELATIVE PERCENT: 1.7 %
HEMATOCRIT: 36.3 % (ref 35.0–44.0)
HEMOGLOBIN: 11.7 g/dL — ABNORMAL LOW (ref 12.0–15.5)
LYMPHOCYTES ABSOLUTE COUNT: 0.9 10*9/L (ref 0.7–4.0)
LYMPHOCYTES RELATIVE PERCENT: 14.2 %
MEAN CORPUSCULAR HEMOGLOBIN CONC: 32.3 g/dL (ref 30.0–36.0)
MEAN CORPUSCULAR HEMOGLOBIN: 26.7 pg (ref 26.0–34.0)
MEAN CORPUSCULAR VOLUME: 82.7 fL (ref 82.0–98.0)
MEAN PLATELET VOLUME: 8.4 fL (ref 7.0–10.0)
MONOCYTES ABSOLUTE COUNT: 0.7 10*9/L (ref 0.1–1.0)
MONOCYTES RELATIVE PERCENT: 11.5 %
NEUTROPHILS ABSOLUTE COUNT: 4.6 10*9/L (ref 1.7–7.7)
NEUTROPHILS RELATIVE PERCENT: 72 %
PLATELET COUNT: 270 10*9/L (ref 150–450)
RED BLOOD CELL COUNT: 4.39 10*12/L (ref 3.90–5.03)
RED CELL DISTRIBUTION WIDTH: 14.4 % (ref 12.0–15.0)
WBC ADJUSTED: 6.4 10*9/L (ref 3.5–10.5)

## 2020-10-02 LAB — LIPID PANEL
CHOLESTEROL/HDL RATIO SCREEN: 3.3 (ref 1.0–4.5)
CHOLESTEROL: 115 mg/dL (ref <=200)
HDL CHOLESTEROL: 35 mg/dL — ABNORMAL LOW (ref 40–60)
LDL CHOLESTEROL CALCULATED: 58 mg/dL (ref 40–99)
NON-HDL CHOLESTEROL: 80 mg/dL (ref 70–130)
TRIGLYCERIDES: 110 mg/dL (ref 0–150)
VLDL CHOLESTEROL CAL: 22 mg/dL (ref 11–41)

## 2020-10-02 LAB — PROTEIN / CREATININE RATIO, URINE
CREATININE, URINE: 125.2 mg/dL
PROTEIN URINE: 17 mg/dL
PROTEIN/CREAT RATIO, URINE: 0.136

## 2020-10-02 LAB — MAGNESIUM: MAGNESIUM: 1.6 mg/dL (ref 1.6–2.6)

## 2020-10-02 LAB — BILIRUBIN, DIRECT: BILIRUBIN DIRECT: 0.3 mg/dL (ref 0.00–0.30)

## 2020-10-02 LAB — PHOSPHORUS: PHOSPHORUS: 4.4 mg/dL (ref 2.4–5.1)

## 2020-10-02 LAB — PARATHYROID HORMONE (PTH): PARATHYROID HORMONE INTACT: 63.1 pg/mL (ref 18.4–80.1)

## 2020-10-02 LAB — TACROLIMUS LEVEL, TROUGH: TACROLIMUS, TROUGH: 3.6 ng/mL — ABNORMAL LOW (ref 5.0–15.0)

## 2020-10-02 NOTE — Unmapped (Signed)
[facility] at [facility]     Type of visit:  face to face    Reason for visit:Follow-up    Questions / Concerns that need to be addressed: no    Diabetes:  ??? Regularly checking blood sugars?: no    Hypertension:  ??? Have blood pressure cuff at home?: yes- arm cuff  ??? Regularly checking blood pressure?: yes    Screening BP- 150/61 HR 64    Omron BPs (complete if screening BP has a systolic  > 139 or diastolic > 89)  BP#1 166/60 HR 63   BP#2 160/61 HR 63  BP#3 158/61 HR 62    Average BP 161/61 HR 63  (please note this as a comment in vitals)     Allergies reviewed: Yes    Medication reviewed: Yes  Pended refills? No    HCDM reviewed and updated in Epic:    We are working to make sure all of our patients??? wishes are updated in Epic and part of that is documenting a Environmental health practitioner for each patient  A Health Care Decision Rodena Piety is someone you choose who can make health care decisions for you if you are not able ??? who would you most want to do this for you????  is already up to date.    BPAs completed:  AUDIT - Alcohol Screen  HARK - Interpersonal Violence  Influenza vaccination    COVID-19 Vaccine Summary  Which COVID-19 Vaccine was administered  Janssen  Type:  Janssen-Complete  Dates Given:  01/11/2020          Date last mAB infusion:  07/17/2020    Immunization History   Administered Date(s) Administered   ??? COVID-19 VACC,(JANSSEN)(PF)(IM) 01/11/2020   ??? INFLUENZA TIV (TRI) PF (IM) 08/30/2006, 09/14/2007, 08/14/2009, 08/26/2011, 07/27/2012   ??? Influenza TRI (IIV3) 4+yrs PF 08/15/2013   ??? Influenza Vaccine Quad (IIV4 PF) 61mo+ injectable 07/04/2014, 07/23/2015, 10/06/2016, 07/27/2017, 06/28/2018, 07/12/2019, 10/02/2020   ??? PNEUMOCOCCAL POLYSACCHARIDE 23 06/22/2003, 08/12/2015   ??? Pneumococcal Conjugate 13-Valent 09/26/2013   ??? SHINGRIX-ZOSTER VACCINE (HZV), RECOMBINANT,SUB-UNIT,ADJUVANTED IM 01/12/2018, 06/17/2018 __________________________________________________________________________________________    SCREENINGS COMPLETED IN FLOWSHEETS    HARK Screening  HARK Screening  Within the last year, have you been humiliated or emotionally abused in other ways by your partner or ex-partner?: No  Within the last year, have you been afraid of your partner or ex-partner?: No  Within the last year, have you been raped or forced to have any kind of sexual activity by your partner or ex-partner?: No  Within the last year, have you been kicked, hit, slapped, or otherwise physically hurt by your partner or ex-partner?: No    AUDIT  AUDIT - C Score (Part 1): 0

## 2020-10-02 NOTE — Unmapped (Addendum)
Sorry patient is late for radiology appointment. She wasn't done at Medicine until 10:55am.  Sophia Davis      [facility] pw: [email or web address]  Username:     Measure your blood pressure and send me the results in 2 weeks.  Call [phone]

## 2020-10-02 NOTE — Unmapped (Signed)
Patient received the COVID-19 Moderna 0.5 ml vaccine and was observed for 15 minutes. Patient tolerated well. No signs or symptoms of distress noted. Patient left clinic ambulatory condition.

## 2020-10-03 DIAGNOSIS — Z940 Kidney transplant status: Principal | ICD-10-CM

## 2020-10-03 DIAGNOSIS — D849 Immunodeficiency, unspecified: Principal | ICD-10-CM

## 2020-10-03 LAB — CMV DNA, QUANTITATIVE, PCR
CMV QUANT LOG10: 2.05 {Log_IU}/mL — ABNORMAL HIGH (ref <0.00)
CMV QUANT: 111 [IU]/mL — ABNORMAL HIGH (ref <0)

## 2020-10-03 MED ORDER — TACROLIMUS 0.5 MG CAPSULE, IMMEDIATE-RELEASE: capsule | 11 refills | 0 days | Status: AC

## 2020-10-03 MED ORDER — TACROLIMUS 0.5 MG CAPSULE, IMMEDIATE-RELEASE
ORAL_CAPSULE | Freq: Two times a day (BID) | ORAL | 11 refills | 0.00000 days | Status: CP
Start: 2020-10-03 — End: 2020-10-03
  Filled 2020-10-15: qty 150, 30d supply, fill #0

## 2020-10-03 NOTE — Unmapped (Signed)
PT's lab orders sent to [facility] which is the [facility] [place]  Fax is [phone]

## 2020-10-03 NOTE — Unmapped (Signed)
Reviewed Tac and CMV with Dr. Toni Arthurs  Increase Tac level to 1.5 mg in am and 1 mg in PM   Have pt repeat labs in 2 weeks to recehck CMV and tac.  Called pt to let her know of change, she will adjust her pill box  Pt states that she does have a hard time getting the the lab but [facility] [place] to have labs drawn. Lab orders sent there.

## 2020-10-04 LAB — VITAMIN D 25 HYDROXY: VITAMIN D, TOTAL (25OH): 35.2 ng/mL (ref 20.0–80.0)

## 2020-10-04 LAB — BK VIRUS QUANTITATIVE PCR, BLOOD: BK BLOOD RESULT: NOT DETECTED

## 2020-10-04 NOTE — Unmapped (Signed)
Clinical Assessment Needed For: Dose Change  Medication: TACROLIMUS 0.5MG  CAPSULE  Last Fill Date/Day Supply: 09/16/2020 / 30 DAYS  Copay $0  Was previous dose already scheduled to fill: No    Notes to Pharmacist: N/A
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
## 2020-10-07 MED ORDER — ATORVASTATIN 40 MG TABLET
ORAL_TABLET | Freq: Every evening | ORAL | 3 refills | 90 days | Status: CP
Start: 2020-10-07 — End: 2021-10-07
  Filled 2020-10-15: qty 90, 90d supply, fill #0

## 2020-10-07 NOTE — Unmapped (Addendum)
[facility] Refill Coordination Note    Specialty Medication(s) to be Shipped:   Transplant: Myfortic 180mg  and tacrolimus 0.5mg     Other medication(s) to be shipped: triamterene/hctz, atorvastatin     Sophia Davis, DOB: 18-Oct-1946  Phone: [phone] (home)       All above HIPAA information was verified with patient.     Was a Nurse, learning disability used for this call? No    Completed refill call assessment today to schedule patient's medication shipment from the [facility] [phone]).       Specialty medication(s) and dose(s) confirmed: Regimen is correct and unchanged.   Changes to medications: Sophia Davis reports no changes at this time.  Changes to insurance: No  Questions for the pharmacist: No    Confirmed patient received Welcome Packet with first shipment. The patient will receive a drug information handout for each medication shipped and additional FDA Medication Guides as required.       DISEASE/MEDICATION-SPECIFIC INFORMATION        N/A    SPECIALTY MEDICATION ADHERENCE     Medication Adherence    Patient reported X missed doses in the last month: 0  Specialty Medication: Myfortic 180mg   Patient is on additional specialty medications: Yes  Additional Specialty Medications: Tacrolimus 0.5mg   Patient Reported Additional Medication X Missed Doses in the Last Month: 0  Patient is on more than two specialty medications: No  Adherence tools used: patient uses a pill box to manage medications                Myfortic 180 mg: 13 days of medicine on hand   Tacrolimus 0.5 mg: 13 days of medicine on hand *    SHIPPING     Shipping address confirmed in Epic.     Delivery Scheduled: Yes, Expected medication delivery date: 10/15/20.     Medication will be delivered via Same Day Courier to the prescription address in Epic WAM.    [name]   [facility] Specialty Pharmacist

## 2020-10-08 LAB — HLA DS POST TRANSPLANT
ANTI-DONOR DRW #1 MFI: 640 MFI
ANTI-DONOR DRW #2 MFI: 63 MFI
ANTI-DONOR HLA-A #1 MFI: 0 MFI
ANTI-DONOR HLA-B #1 MFI: 0 MFI
ANTI-DONOR HLA-B #2 MFI: 10 MFI
ANTI-DONOR HLA-C #1 MFI: 0 MFI
ANTI-DONOR HLA-C #2 MFI: 0 MFI
ANTI-DONOR HLA-DQB #1 MFI: 0 MFI
ANTI-DONOR HLA-DQB #2 MFI: 3 MFI
ANTI-DONOR HLA-DR #1 MFI: 10 MFI
ANTI-DONOR HLA-DR #2 MFI: 37 MFI

## 2020-10-08 LAB — FSAB CLASS 2 ANTIBODY SPECIFICITY: HLA CL2 AB RESULT: POSITIVE

## 2020-10-08 LAB — FSAB CLASS 1 ANTIBODY SPECIFICITY: HLA CLASS 1 ANTIBODY RESULT: POSITIVE

## 2020-10-08 LAB — VITAMIN D 1,25 DIHYDROXY: VITAMIN D 1,25-DIHYDROXY: 24 pg/mL
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
## 2020-10-14 ENCOUNTER — Other Ambulatory Visit
Admission: RE | Admit: 2020-10-14 | Discharge: 2020-10-14 | Disposition: A | Discharge status: Home / Self Care | Payer: Medicare Other | Attending: Nephrology | Admitting: Nephrology

## 2020-10-14 DIAGNOSIS — B259 Cytomegaloviral disease, unspecified: Secondary | ICD-10-CM | POA: Insufficient documentation

## 2020-10-14 DIAGNOSIS — E559 Vitamin D deficiency, unspecified: Secondary | ICD-10-CM | POA: Insufficient documentation

## 2020-10-14 DIAGNOSIS — D631 Anemia in chronic kidney disease: Secondary | ICD-10-CM | POA: Diagnosis not present

## 2020-10-14 DIAGNOSIS — N390 Urinary tract infection, site not specified: Secondary | ICD-10-CM | POA: Insufficient documentation

## 2020-10-14 DIAGNOSIS — Z79899 Other long term (current) drug therapy: Secondary | ICD-10-CM | POA: Insufficient documentation

## 2020-10-14 DIAGNOSIS — Z09 Encounter for follow-up examination after completed treatment for conditions other than malignant neoplasm: Secondary | ICD-10-CM | POA: Insufficient documentation

## 2020-10-14 DIAGNOSIS — D899 Disorder involving the immune mechanism, unspecified: Secondary | ICD-10-CM | POA: Diagnosis not present

## 2020-10-14 DIAGNOSIS — Z940 Kidney transplant status: Secondary | ICD-10-CM | POA: Insufficient documentation

## 2020-10-14 DIAGNOSIS — Z114 Encounter for screening for human immunodeficiency virus [HIV]: Secondary | ICD-10-CM | POA: Insufficient documentation

## 2020-10-14 DIAGNOSIS — Z9483 Pancreas transplant status: Secondary | ICD-10-CM | POA: Diagnosis not present

## 2020-10-14 DIAGNOSIS — Z789 Other specified health status: Secondary | ICD-10-CM | POA: Diagnosis not present

## 2020-10-14 DIAGNOSIS — E1129 Type 2 diabetes mellitus with other diabetic kidney complication: Secondary | ICD-10-CM | POA: Diagnosis not present

## 2020-10-14 LAB — CBC WITH DIFFERENTIAL/PLATELET
Abs Immature Granulocytes: 0.02 10*3/uL (ref 0.00–0.07)
Basophils Absolute: 0 10*3/uL (ref 0.0–0.1)
Basophils Relative: 1 %
Eosinophils Absolute: 0.1 10*3/uL (ref 0.0–0.5)
Eosinophils Relative: 2 %
HCT: 33.8 % — ABNORMAL LOW (ref 36.0–46.0)
Hemoglobin: 11.1 g/dL — ABNORMAL LOW (ref 12.0–15.0)
Immature Granulocytes: 0 %
Lymphocytes Relative: 16 %
Lymphs Abs: 1 10*3/uL (ref 0.7–4.0)
MCH: 27.1 pg (ref 26.0–34.0)
MCHC: 32.8 g/dL (ref 30.0–36.0)
MCV: 82.6 fL (ref 80.0–100.0)
Monocytes Absolute: 0.7 10*3/uL (ref 0.1–1.0)
Monocytes Relative: 12 %
Neutro Abs: 4.1 10*3/uL (ref 1.7–7.7)
Neutrophils Relative %: 69 %
Platelets: 264 10*3/uL (ref 150–400)
RBC: 4.09 MIL/uL (ref 3.87–5.11)
RDW: 13.1 % (ref 11.5–15.5)
WBC: 5.9 10*3/uL (ref 4.0–10.5)
nRBC: 0 % (ref 0.0–0.2)

## 2020-10-14 LAB — BASIC METABOLIC PANEL
Anion gap: 9 (ref 5–15)
BUN: 25 mg/dL — ABNORMAL HIGH (ref 8–23)
CO2: 25 mmol/L (ref 22–32)
Calcium: 9.3 mg/dL (ref 8.9–10.3)
Chloride: 98 mmol/L (ref 98–111)
Creatinine, Ser: 1.11 mg/dL — ABNORMAL HIGH (ref 0.44–1.00)
GFR, Estimated: 52 mL/min — ABNORMAL LOW (ref >60)
Glucose, Bld: 106 mg/dL — ABNORMAL HIGH (ref 70–99)
Potassium: 3.5 mmol/L (ref 3.5–5.1)
Sodium: 132 mmol/L — ABNORMAL LOW (ref 135–145)

## 2020-10-14 LAB — PHOSPHORUS
PHOSPHORUS: 3.7 mg/dL (ref 2.5–4.6)
Phosphorus: 3.7 mg/dL (ref 2.5–4.6)

## 2020-10-14 LAB — MAGNESIUM
MAGNESIUM: 1.7 mg/dL (ref 1.7–2.4)
Magnesium: 1.7 mg/dL (ref 1.7–2.4)

## 2020-10-14 LAB — CBC W/ DIFFERENTIAL
BASOPHILS ABSOLUTE COUNT: 0 10*3/uL (ref 0.0–0.1)
BASOPHILS RELATIVE PERCENT: 1
EOSINOPHILS ABSOLUTE COUNT: 0.1 10*3/uL (ref 0.0–0.5)
EOSINOPHILS RELATIVE PERCENT: 2 %
HEMATOCRIT: 33.8 — ABNORMAL LOW (ref 36.0–46.0)
HEMOGLOBIN: 11.1 g/dL — ABNORMAL LOW (ref 12.0–15.0)
IMMATURE CELLS: 0 %
LYMPHOCYTES ABSOLUTE COUNT: 1 10*3/uL (ref 0.7–4.0)
LYMPHOCYTES RELATIVE PERCENT: 16
MEAN CORPUSCULAR HEMOGLOBIN CONC: 32.8 g/dL (ref 30.0–36.0)
MEAN CORPUSCULAR HEMOGLOBIN: 27.1 pg (ref 26.0–34.0)
MEAN CORPUSCULAR VOLUME: 82.6 fL (ref 80.0–100.0)
MONOCYTES ABSOLUTE COUNT: 0.7 10*3/uL (ref 0.1–1.0)
MONOCYTES RELATIVE PERCENT: 12 %
NEUTROPHILS ABSOLUTE COUNT: 4.1 10*3/uL (ref 1.7–7.7)
NEUTROPHILS RELATIVE PERCENT: 69
NUCLEATED RED BLOOD CELLS: 0 % (ref 0.0–0.2)
PLATELET COUNT: 264 10*3/uL (ref 150–400)
RED BLOOD CELL COUNT: 4.09 (ref 3.87–5.11)
RED CELL DISTRIBUTION WIDTH: 13.1 % (ref 11.5–15.5)
WHITE BLOOD CELL COUNT: 5.9 10*3/uL (ref 4.0–10.5)

## 2020-10-14 LAB — RENAL FUNCTION PANEL
ANION GAP: 9 (ref 5–15)
BLOOD UREA NITROGEN: 25 mg/dL — ABNORMAL HIGH (ref 8–23)
CALCIUM: 9.3 (ref 8.9–10.3)
CHLORIDE: 98 mmol/L (ref 98–111)
CO2: 25 mmol/L (ref 22–32)
CREATININE: 1.11 — ABNORMAL HIGH (ref 0.44–1.00)
EGFR MDRD NON AF AMER: 52 — ABNORMAL LOW
GLUCOSE RANDOM: 106 mg/dL — ABNORMAL HIGH (ref 70–99)
POTASSIUM: 3.5 mmol/L (ref 3.5–5.1)
SODIUM: 132 — ABNORMAL LOW (ref 135–145)

## 2020-10-15 MED FILL — MYFORTIC 180 MG TABLET,DELAYED RELEASE: ORAL | 30 days supply | Qty: 120 | Fill #10

## 2020-10-15 MED FILL — MYFORTIC 180 MG TABLET,DELAYED RELEASE: 30 days supply | Qty: 120 | Fill #10 | Status: AC

## 2020-10-15 MED FILL — ATORVASTATIN 40 MG TABLET: 90 days supply | Qty: 90 | Fill #0 | Status: AC

## 2020-10-15 MED FILL — TRIAMTERENE 37.5 MG-HYDROCHLOROTHIAZIDE 25 MG CAPSULE: ORAL | 30 days supply | Qty: 30 | Fill #4

## 2020-10-15 MED FILL — TRIAMTERENE 37.5 MG-HYDROCHLOROTHIAZIDE 25 MG CAPSULE: 30 days supply | Qty: 30 | Fill #4 | Status: AC

## 2020-10-15 MED FILL — TACROLIMUS 0.5 MG CAPSULE, IMMEDIATE-RELEASE: 30 days supply | Qty: 150 | Fill #0 | Status: AC

## 2020-10-16 LAB — TACROLIMUS LEVEL
TACROLIMUS BLOOD: 4.9
Tacrolimus (FK506) - LabCorp: 4.9 ng/mL (ref 2.0–20.0)

## 2020-10-18 LAB — CMV DNA BY PCR, QUALITATIVE: CMV DNA, Qual PCR: NEGATIVE

## 2020-10-21 LAB — CMV DNA, QUANTITATIVE, PCR: CMV QUANT: NEGATIVE

## 2020-10-23 NOTE — Unmapped (Signed)
Symptom Complaints    ??? Caller name: Conni, Knighton (Self)  ??? Best callback number (defaults to patient's preferred phone - confirm or change): [phone]  ??? Describe the symptom(s):     Patient states she was informed by Dr. Irena Cords to please call [facility] and inform nurse of Blood Pressure number after being home for two weeks. She would like a call back from the nurse so she can give these numbers.  Please give her a call back at phone number [phone].     o Has the patient been seen by a physician for the same symptom(s) before?: Yes at her last appointment.     ??? PCP: Rockne Menghini, MD  ??? Last encounter in department: 10/02/2020

## 2020-10-24 NOTE — Unmapped (Signed)
Called and spoke to patient about her BP  numbers from last 2 weeks.  Her highest BP was 139/62 &HR 62 on 10/06/20 @ 6:30 pm and lowest BP 113/64 HR 82 on 10/07/20 @ 9:30 am. Rest of time Systolic 110-120/ Diastolic 60-70

## 2020-10-25 NOTE — Unmapped (Signed)
LVMM to call office to speak to triage nurse about message for pCP regarding her BP readings at home

## 2020-10-26 NOTE — Unmapped (Signed)
Called and spoke to patient, and advised her of Dr Dewalt's message.She verbalized understanding.

## 2020-11-01 NOTE — Unmapped (Signed)
[facility] Refill Coordination Note    Specialty Medication(s) to be Shipped:   Transplant: Myfortic 180mg  and tacrolimus 0.5mg     Other medication(s) to be shipped: triamterene/hctz     Sophia Davis, DOB: 1946/05/23  Phone: [phone] (home)       All above HIPAA information was verified with patient.     Was a Nurse, learning disability used for this call? No    Completed refill call assessment today to schedule patient's medication shipment from the [facility] [phone]).       Specialty medication(s) and dose(s) confirmed: Regimen is correct and unchanged.   Changes to medications: Sophia Davis reports no changes at this time.  Changes to insurance: No  Questions for the pharmacist: No    Confirmed patient received Welcome Packet with first shipment. The patient will receive a drug information handout for each medication shipped and additional FDA Medication Guides as required.       DISEASE/MEDICATION-SPECIFIC INFORMATION        N/A    SPECIALTY MEDICATION ADHERENCE     Medication Adherence    Patient reported X missed doses in the last month: 0  Specialty Medication: Myfortic 180mg   Patient is on additional specialty medications: Yes  Additional Specialty Medications: Tacrolimus 0.5mg   Patient Reported Additional Medication X Missed Doses in the Last Month: 0  Patient is on more than two specialty medications: No  Adherence tools used: patient uses a pill box to manage medications        Myfortic 180 mg: 13 days of medicine on hand   Tacrolimus 0.5 mg: 13 days of medicine on hand     SHIPPING     Shipping address confirmed in Epic.     Delivery Scheduled: Yes, Expected medication delivery date: 11/13/2020.     Medication will be delivered via Next Day Courier to the prescription address in Epic W[facility].    Sophia Davis   [facility] Specialty Technician

## 2020-11-09 ENCOUNTER — Other Ambulatory Visit: Payer: Self-pay

## 2020-11-09 ENCOUNTER — Encounter: Payer: Self-pay | Admitting: Intensive Care

## 2020-11-09 DIAGNOSIS — T8619 Other complication of kidney transplant: Secondary | ICD-10-CM | POA: Diagnosis present

## 2020-11-09 DIAGNOSIS — N179 Acute kidney failure, unspecified: Secondary | ICD-10-CM | POA: Diagnosis present

## 2020-11-09 DIAGNOSIS — I959 Hypotension, unspecified: Secondary | ICD-10-CM | POA: Diagnosis present

## 2020-11-09 DIAGNOSIS — Z90710 Acquired absence of both cervix and uterus: Secondary | ICD-10-CM

## 2020-11-09 DIAGNOSIS — R319 Hematuria, unspecified: Secondary | ICD-10-CM | POA: Diagnosis present

## 2020-11-09 DIAGNOSIS — U071 COVID-19: Principal | ICD-10-CM | POA: Diagnosis present

## 2020-11-09 DIAGNOSIS — R112 Nausea with vomiting, unspecified: Secondary | ICD-10-CM | POA: Diagnosis not present

## 2020-11-09 DIAGNOSIS — Z79899 Other long term (current) drug therapy: Secondary | ICD-10-CM

## 2020-11-09 DIAGNOSIS — Y830 Surgical operation with transplant of whole organ as the cause of abnormal reaction of the patient, or of later complication, without mention of misadventure at the time of the procedure: Secondary | ICD-10-CM | POA: Diagnosis present

## 2020-11-09 DIAGNOSIS — J449 Chronic obstructive pulmonary disease, unspecified: Secondary | ICD-10-CM | POA: Diagnosis present

## 2020-11-09 DIAGNOSIS — A0839 Other viral enteritis: Secondary | ICD-10-CM | POA: Diagnosis present

## 2020-11-09 DIAGNOSIS — I129 Hypertensive chronic kidney disease with stage 1 through stage 4 chronic kidney disease, or unspecified chronic kidney disease: Secondary | ICD-10-CM | POA: Diagnosis present

## 2020-11-09 DIAGNOSIS — E7800 Pure hypercholesterolemia, unspecified: Secondary | ICD-10-CM | POA: Diagnosis present

## 2020-11-09 DIAGNOSIS — Z7982 Long term (current) use of aspirin: Secondary | ICD-10-CM

## 2020-11-09 DIAGNOSIS — N1831 Chronic kidney disease, stage 3a: Secondary | ICD-10-CM | POA: Diagnosis present

## 2020-11-09 DIAGNOSIS — E785 Hyperlipidemia, unspecified: Secondary | ICD-10-CM | POA: Diagnosis present

## 2020-11-09 LAB — CBC
HCT: 36.5 % (ref 36.0–46.0)
Hemoglobin: 12 g/dL (ref 12.0–15.0)
MCH: 26.4 pg (ref 26.0–34.0)
MCHC: 32.9 g/dL (ref 30.0–36.0)
MCV: 80.2 fL (ref 80.0–100.0)
Platelets: 181 10*3/uL (ref 150–400)
RBC: 4.55 MIL/uL (ref 3.87–5.11)
RDW: 13.2 % (ref 11.5–15.5)
WBC: 7 10*3/uL (ref 4.0–10.5)
nRBC: 0 % (ref 0.0–0.2)

## 2020-11-09 LAB — BASIC METABOLIC PANEL
Anion gap: 16 — ABNORMAL HIGH (ref 5–15)
BUN: 71 mg/dL — ABNORMAL HIGH (ref 8–23)
CO2: 16 mmol/L — ABNORMAL LOW (ref 22–32)
Calcium: 9.1 mg/dL (ref 8.9–10.3)
Chloride: 104 mmol/L (ref 98–111)
Creatinine, Ser: 2.4 mg/dL — ABNORMAL HIGH (ref 0.44–1.00)
GFR, Estimated: 21 mL/min — ABNORMAL LOW (ref >60)
Glucose, Bld: 142 mg/dL — ABNORMAL HIGH (ref 70–99)
Potassium: 4.5 mmol/L (ref 3.5–5.1)
Sodium: 136 mmol/L (ref 135–145)

## 2020-11-09 NOTE — ED Triage Notes (Addendum)
Patient c/o weakness, fatigue, emesis that has gotten better and reports poor po intake. HX kidney transplant. C/o feeling bad X3 weeks. Positive at home covid test today

## 2020-11-10 ENCOUNTER — Inpatient Hospital Stay: Payer: Medicare Other

## 2020-11-10 ENCOUNTER — Inpatient Hospital Stay
Admission: EM | Admit: 2020-11-10 | Discharge: 2020-11-11 | DRG: 178 | Disposition: A | Discharge status: Home Health | Payer: Medicare Other | Attending: Internal Medicine | Admitting: Internal Medicine

## 2020-11-10 DIAGNOSIS — E7800 Pure hypercholesterolemia, unspecified: Secondary | ICD-10-CM | POA: Diagnosis present

## 2020-11-10 DIAGNOSIS — Z940 Kidney transplant status: Secondary | ICD-10-CM

## 2020-11-10 DIAGNOSIS — T8619 Other complication of kidney transplant: Secondary | ICD-10-CM | POA: Diagnosis present

## 2020-11-10 DIAGNOSIS — Z90710 Acquired absence of both cervix and uterus: Secondary | ICD-10-CM | POA: Diagnosis not present

## 2020-11-10 DIAGNOSIS — Y830 Surgical operation with transplant of whole organ as the cause of abnormal reaction of the patient, or of later complication, without mention of misadventure at the time of the procedure: Secondary | ICD-10-CM | POA: Diagnosis present

## 2020-11-10 DIAGNOSIS — N179 Acute kidney failure, unspecified: Secondary | ICD-10-CM

## 2020-11-10 DIAGNOSIS — K529 Noninfective gastroenteritis and colitis, unspecified: Secondary | ICD-10-CM | POA: Diagnosis present

## 2020-11-10 DIAGNOSIS — Z79899 Other long term (current) drug therapy: Secondary | ICD-10-CM | POA: Diagnosis not present

## 2020-11-10 DIAGNOSIS — R319 Hematuria, unspecified: Secondary | ICD-10-CM | POA: Diagnosis present

## 2020-11-10 DIAGNOSIS — U071 COVID-19: Secondary | ICD-10-CM | POA: Diagnosis present

## 2020-11-10 DIAGNOSIS — Z7982 Long term (current) use of aspirin: Secondary | ICD-10-CM | POA: Diagnosis not present

## 2020-11-10 DIAGNOSIS — E785 Hyperlipidemia, unspecified: Secondary | ICD-10-CM | POA: Diagnosis present

## 2020-11-10 DIAGNOSIS — I10 Essential (primary) hypertension: Secondary | ICD-10-CM | POA: Diagnosis present

## 2020-11-10 DIAGNOSIS — A0839 Other viral enteritis: Secondary | ICD-10-CM

## 2020-11-10 DIAGNOSIS — I129 Hypertensive chronic kidney disease with stage 1 through stage 4 chronic kidney disease, or unspecified chronic kidney disease: Secondary | ICD-10-CM | POA: Diagnosis present

## 2020-11-10 DIAGNOSIS — R112 Nausea with vomiting, unspecified: Secondary | ICD-10-CM | POA: Diagnosis present

## 2020-11-10 DIAGNOSIS — N1831 Chronic kidney disease, stage 3a: Secondary | ICD-10-CM | POA: Diagnosis present

## 2020-11-10 DIAGNOSIS — J449 Chronic obstructive pulmonary disease, unspecified: Secondary | ICD-10-CM | POA: Diagnosis present

## 2020-11-10 DIAGNOSIS — I959 Hypotension, unspecified: Secondary | ICD-10-CM | POA: Diagnosis present

## 2020-11-10 HISTORY — DX: Disorder of kidney and ureter, unspecified: N28.9

## 2020-11-10 HISTORY — DX: Pure hypercholesterolemia, unspecified: E78.00

## 2020-11-10 LAB — URINALYSIS, COMPLETE (UACMP) WITH MICROSCOPIC
Bilirubin Urine: NEGATIVE
Glucose, UA: NEGATIVE mg/dL
Ketones, ur: NEGATIVE mg/dL
Nitrite: NEGATIVE
Protein, ur: NEGATIVE mg/dL
Specific Gravity, Urine: 1.011 (ref 1.005–1.030)
pH: 5 (ref 5.0–8.0)

## 2020-11-10 LAB — POC SARS CORONAVIRUS 2 AG -  ED: SARS Coronavirus 2 Ag: POSITIVE — AB

## 2020-11-10 MED ORDER — MYCOPHENOLATE SODIUM 180 MG PO TBEC: 180.0000 mg | DELAYED_RELEASE_TABLET | Freq: Two times a day (BID) | ORAL | Status: DC

## 2020-11-10 MED ORDER — SODIUM CHLORIDE 0.9 % IV SOLN: INTRAVENOUS | Status: DC

## 2020-11-10 MED ORDER — METOPROLOL TARTRATE 25 MG PO TABS
25.0000 mg | ORAL_TABLET | Freq: Two times a day (BID) | ORAL | Status: DC
  Administered 2020-11-11: 25 mg via ORAL
  Filled 2020-11-10 (×2): qty 1

## 2020-11-10 MED ORDER — TACROLIMUS 1 MG PO CAPS
1.5000 mg | ORAL_CAPSULE | Freq: Two times a day (BID) | ORAL | Status: DC
  Administered 2020-11-11: 09:00:00 1.5 mg via ORAL
  Filled 2020-11-10 (×2): qty 1

## 2020-11-10 MED ORDER — SODIUM CHLORIDE 0.9% FLUSH: 3.0000 mL | INTRAVENOUS | Status: DC | PRN

## 2020-11-10 MED ORDER — TACROLIMUS 0.5 MG PO CAPS: 0.5000 mg | ORAL_CAPSULE | Freq: Two times a day (BID) | ORAL | Status: DC

## 2020-11-10 MED ORDER — ENOXAPARIN SODIUM 30 MG/0.3ML ~~LOC~~ SOLN
30.0000 mg | SUBCUTANEOUS | Status: DC
  Administered 2020-11-11: 30 mg via SUBCUTANEOUS
  Filled 2020-11-10 (×2): qty 0.3

## 2020-11-10 MED ORDER — MYCOPHENOLATE SODIUM 180 MG PO TBEC
360.0000 mg | DELAYED_RELEASE_TABLET | Freq: Two times a day (BID) | ORAL | Status: DC
  Administered 2020-11-11: 09:00:00 360 mg via ORAL
  Filled 2020-11-10 (×3): qty 2

## 2020-11-10 MED ORDER — ONDANSETRON HCL 4 MG PO TABS: 4.0000 mg | ORAL_TABLET | Freq: Four times a day (QID) | ORAL | Status: DC | PRN

## 2020-11-10 MED ORDER — SODIUM CHLORIDE 0.9 % IV SOLN: INTRAVENOUS | Status: AC

## 2020-11-10 MED ORDER — SODIUM CHLORIDE 0.9% FLUSH
3.0000 mL | Freq: Two times a day (BID) | INTRAVENOUS | Status: DC
  Administered 2020-11-11: 3 mL via INTRAVENOUS

## 2020-11-10 MED ORDER — SODIUM CHLORIDE 0.9 % IV SOLN
100.0000 mg | Freq: Every day | INTRAVENOUS | Status: DC
  Administered 2020-11-11: 100 mg via INTRAVENOUS
  Filled 2020-11-10: qty 20

## 2020-11-10 MED ORDER — ASPIRIN EC 81 MG PO TBEC
81.0000 mg | DELAYED_RELEASE_TABLET | Freq: Every day | ORAL | Status: DC
  Administered 2020-11-11: 81 mg via ORAL
  Filled 2020-11-10 (×2): qty 1

## 2020-11-10 MED ORDER — ALBUTEROL SULFATE HFA 108 (90 BASE) MCG/ACT IN AERS
2.0000 | INHALATION_SPRAY | RESPIRATORY_TRACT | Status: DC | PRN
  Filled 2020-11-10: qty 6.7

## 2020-11-10 MED ORDER — ATORVASTATIN CALCIUM 20 MG PO TABS
40.0000 mg | ORAL_TABLET | Freq: Every day | ORAL | Status: DC
  Administered 2020-11-11: 09:00:00 40 mg via ORAL
  Filled 2020-11-10: qty 2

## 2020-11-10 MED ORDER — SODIUM CHLORIDE 0.9 % IV BOLUS (SEPSIS)
1000.0000 mL | Freq: Once | INTRAVENOUS | Status: AC
  Administered 2020-11-10: 1000 mL via INTRAVENOUS

## 2020-11-10 MED ORDER — AMLODIPINE BESYLATE 5 MG PO TABS
5.0000 mg | ORAL_TABLET | Freq: Every day | ORAL | Status: DC
  Administered 2020-11-11: 09:00:00 5 mg via ORAL
  Filled 2020-11-10: qty 1

## 2020-11-10 MED ORDER — HYDROCODONE-ACETAMINOPHEN 5-325 MG PO TABS: 1.0000 | ORAL_TABLET | ORAL | Status: DC | PRN

## 2020-11-10 MED ORDER — ONDANSETRON HCL 4 MG/2ML IJ SOLN: 4.0000 mg | Freq: Four times a day (QID) | INTRAMUSCULAR | Status: DC | PRN

## 2020-11-10 MED ORDER — VITAMIN D 25 MCG (1000 UNIT) PO TABS
1000.0000 [IU] | ORAL_TABLET | Freq: Every day | ORAL | Status: DC
  Administered 2020-11-10 – 2020-11-11 (×2): 1000 [IU] via ORAL
  Filled 2020-11-10 (×2): qty 1

## 2020-11-10 MED ORDER — ASCORBIC ACID 500 MG PO TABS
500.0000 mg | ORAL_TABLET | Freq: Every day | ORAL | Status: DC
  Administered 2020-11-10 – 2020-11-11 (×2): 500 mg via ORAL
  Filled 2020-11-10 (×2): qty 1

## 2020-11-10 MED ORDER — SODIUM CHLORIDE 0.9 % IV SOLN
200.0000 mg | Freq: Once | INTRAVENOUS | Status: AC
  Administered 2020-11-10: 200 mg via INTRAVENOUS
  Filled 2020-11-10: qty 200

## 2020-11-10 MED ORDER — SODIUM CHLORIDE 0.9 % IV SOLN: 250.0000 mL | INTRAVENOUS | Status: DC | PRN

## 2020-11-10 MED ORDER — ZOLPIDEM TARTRATE 5 MG PO TABS: 5.0000 mg | ORAL_TABLET | Freq: Every evening | ORAL | Status: DC | PRN

## 2020-11-10 MED ORDER — ZINC SULFATE 220 (50 ZN) MG PO CAPS
220.0000 mg | ORAL_CAPSULE | Freq: Every day | ORAL | Status: DC
  Administered 2020-11-10 – 2020-11-11 (×2): 220 mg via ORAL
  Filled 2020-11-10 (×2): qty 1

## 2020-11-10 NOTE — ED Notes (Signed)
Pt requesting home meds to be ordered. Messaged attending to request home meds to be ordered.

## 2020-11-10 NOTE — Consult Note (Signed)
[facility]  CONSULT NOTE    Date: 11/10/2020                  Patient Name:  Ruth Johnson  MRN: [id number]  DOB: [birth year]  Age / Sex: 75 y.o., female         PCP: [facility] Requesting Consult: Dr. Joylene Igo                 Reason for Consult: Acute kidney injury            History of Present Illness: Ruth Johnson admitted to [facility] on 11/10/2020 for Gastroenteritis [K52.9] Gastroenteritis due to COVID-19 virus [U07.1, A08.39]  Patient states she has been having nausea/vomiting/diarrhea and poor appetite for several days. She has been continuing to take all her medications. She states she has been short of breath for over one week. She is surprised with her diagnosis as she states she got vaccinated and had her booster.    Medications: Outpatient medications: (Not in a hospital admission)   Current medications: Current Facility-Administered Medications  Medication Dose Route Frequency Provider Last Rate Last Admin  . 0.9 %  sodium chloride infusion  250 mL Intravenous PRN Agbata, Tochukwu, MD      . 0.9 %  sodium chloride infusion   Intravenous Continuous Agbata, Tochukwu, MD 100 mL/hr at 11/10/20 1150 Rate Verify at 11/10/20 1150  . albuterol (VENTOLIN HFA) 108 (90 Base) MCG/ACT inhaler 2 puff  2 puff Inhalation Q4H PRN Agbata, Tochukwu, MD      . amLODipine (NORVASC) tablet 5 mg  5 mg Oral Daily Agbata, Tochukwu, MD      . ascorbic acid (VITAMIN C) tablet 500 mg  500 mg Oral Daily Agbata, Tochukwu, MD   500 mg at 11/10/20 1159  . aspirin tablet 81 mg  81 mg Oral Daily Agbata, Tochukwu, MD      . atorvastatin (LIPITOR) tablet 40 mg  40 mg Oral Daily Agbata, Tochukwu, MD      . cholecalciferol (VITAMIN D3) tablet 1,000 Units  1,000 Units Oral Daily Agbata, Tochukwu, MD   1,000 Units at 11/10/20 1200  . enoxaparin (LOVENOX) injection 30 mg  30 mg Subcutaneous Q24H Agbata, Tochukwu, MD      .  HYDROcodone-acetaminophen (NORCO/VICODIN) 5-325 MG per tablet 1 tablet  1 tablet Oral Q4H PRN Agbata, Tochukwu, MD      . metoprolol tartrate (LOPRESSOR) tablet 25 mg  25 mg Oral BID Agbata, Tochukwu, MD      . mycophenolate (MYFORTIC) EC tablet 180 mg  180 mg Oral BID Agbata, Tochukwu, MD      . ondansetron (ZOFRAN) tablet 4 mg  4 mg Oral Q6H PRN Agbata, Tochukwu, MD       Or  . ondansetron (ZOFRAN) injection 4 mg  4 mg Intravenous Q6H PRN Agbata, Tochukwu, MD      . Melene Muller ON 11/11/2020] remdesivir 100 mg in sodium chloride 0.9 % 100 mL IVPB  100 mg Intravenous Daily Agbata, Tochukwu, MD      . sodium chloride flush (NS) 0.9 % injection 3 mL  3 mL Intravenous Q12H Agbata, Tochukwu, MD      . sodium chloride flush (NS) 0.9 % injection 3 mL  3 mL Intravenous PRN Agbata, Tochukwu, MD      . tacrolimus (PROGRAF) capsule 0.5 mg  0.5  mg Oral BID Agbata, Tochukwu, MD      . zinc sulfate capsule 220 mg  220 mg Oral Daily Agbata, Tochukwu, MD   220 mg at 11/10/20 1159  . zolpidem (AMBIEN) tablet 5 mg  5 mg Oral QHS PRN Agbata, Tochukwu, MD       Current Outpatient Medications  Medication Sig Dispense Refill  . alendronate (FOSAMAX) 70 MG tablet Take 70 mg by mouth once a week. Take with a full glass of water on an empty stomach.    Marland Kitchen albuterol (PROVENTIL HFA;VENTOLIN HFA) 108 (90 BASE) MCG/ACT inhaler Inhale 2 puffs into the lungs every 4 (four) hours as needed for wheezing or shortness of breath.    Marland Kitchen amLODipine (NORVASC) 2.5 MG tablet Take 2.5 mg by mouth daily.    Marland Kitchen aspirin 81 MG tablet Take 81 mg by mouth daily. AM    . atorvastatin (LIPITOR) 40 MG tablet Take 40 mg by mouth daily. PM    . Cholecalciferol (VITAMIN D PO) Take by mouth daily. AM    . famotidine (PEPCID) 20 MG tablet Take 20 mg by mouth 2 (two) times daily.    Marland Kitchen losartan (COZAAR) 50 MG tablet Take 50 mg by mouth 2 (two) times daily.    . metoprolol tartrate (LOPRESSOR) 25 MG tablet Take 25 mg by mouth 2 (two) times daily.    .  mycophenolate (MYFORTIC) 180 MG EC tablet Take 180 mg by mouth 2 (two) times daily.    . tacrolimus (PROGRAF) 0.5 MG capsule Take 0.5 mg by mouth 2 (two) times daily.    Marland Kitchen triamterene-hydrochlorothiazide (DYAZIDE) 37.5-25 MG capsule Take 1 capsule by mouth daily.    Marland Kitchen zolpidem (AMBIEN) 5 MG tablet Take 5 mg by mouth at bedtime as needed for sleep.        Allergies: No Known Allergies    Past Medical History: Past Medical History:  Diagnosis Date  . Asthma   . Full dentures    upper and lower  . High cholesterol   . Hypertension    sees Dr. Lady Gary.  . Renal disorder      Past Surgical History: Past Surgical History:  Procedure Laterality Date  . ABDOMINAL HYSTERECTOMY  1998  . CARDIAC CATHETERIZATION  1998   stents  . KIDNEY TRANSPLANT Right 05/17/13   [facility]  . TRIGGER FINGER RELEASE Right 03/06/2015   Procedure: Release of right long and ring trigger fingers;  Surgeon: Christena Flake, MD;  Location: [facility];  Service: Orthopedics;  Laterality: Right;     Family History: History reviewed. No pertinent family history.   Social History: Social History   Socioeconomic History  . Marital status: Widowed    Spouse name: Not on file  . Number of children: Not on file  . Years of education: Not on file  . Highest education level: Not on file  Occupational History  . Not on file  Tobacco Use  . Smoking status: Never Smoker  . Smokeless tobacco: Never Used  Substance and Sexual Activity  . Alcohol use: No  . Drug use: Never  . Sexual activity: Not on file  Other Topics Concern  . Not on file  Social History Narrative  . Not on file   Social Determinants of Health   Financial Resource Strain: Not on file  Food Insecurity: Not on file  Transportation Needs: Not on file  Physical Activity: Not on file  Stress: Not on file  Social Connections: Not on file  Intimate Partner Violence: Not on file     Review of Systems: Review of Systems   Constitutional: Negative.   HENT: Negative.   Eyes: Negative.   Respiratory: Positive for cough, hemoptysis, sputum production, shortness of breath and wheezing.   Cardiovascular: Negative for chest pain, palpitations, orthopnea, claudication, leg swelling and PND.  Gastrointestinal: Positive for nausea and vomiting.  Genitourinary: Negative for dysuria, flank pain, frequency, hematuria and urgency.  Musculoskeletal: Negative for back pain, falls, joint pain, myalgias and neck pain.  Skin: Negative.   Neurological: Negative.   Endo/Heme/Allergies: Negative.   Psychiatric/Behavioral: Negative.     Vital Signs: Blood pressure (!) 127/56, pulse 68, temperature 97.9 F (36.6 C), temperature source Oral, resp. rate 16, height 5\' 1"  (1.549 m), weight 52.2 kg, SpO2 97 %.  Weight trends: Filed Weights   11/09/20 1841  Weight: 52.2 kg    Physical Exam: General: NAD,   Head: Normocephalic, atraumatic. Moist oral mucosal membranes  Eyes: Anicteric, PERRL  Neck: Supple, trachea midline  Lungs:  Bilateral rhonchi, [place] O2  Heart: Regular rate and rhythm  Abdomen:  Soft, nontender,   Extremities: no peripheral edema.  Neurologic: Nonfocal, moving all four extremities  Skin: No lesions  Access: none     Lab results: Basic Metabolic Panel: Recent Labs  Lab 11/09/20 1855  NA 136  K 4.5  CL 104  CO2 16*  GLUCOSE 142*  BUN 71*  CREATININE 2.40*  CALCIUM 9.1    Liver Function Tests: No results for input(s): AST, ALT, ALKPHOS, BILITOT, PROT, ALBUMIN in the last 168 hours. No results for input(s): LIPASE, AMYLASE in the last 168 hours. No results for input(s): AMMONIA in the last 168 hours.  CBC: Recent Labs  Lab 11/09/20 1855  WBC 7.0  HGB 12.0  HCT 36.5  MCV 80.2  PLT 181    Cardiac Enzymes: No results for input(s): CKTOTAL, CKMB, CKMBINDEX, TROPONINI in the last 168 hours.  BNP: Invalid input(s): POCBNP  CBG: No results for input(s): GLUCAP in the last 168  hours.  Microbiology: No results found for this or any previous visit.  Coagulation Studies: No results for input(s): LABPROT, INR in the last 72 hours.  Urinalysis: Recent Labs    11/09/20 1127  COLORURINE YELLOW*  LABSPEC 1.011  PHURINE 5.0  GLUCOSEU NEGATIVE  HGBUR SMALL*  BILIRUBINUR NEGATIVE  KETONESUR NEGATIVE  PROTEINUR NEGATIVE  NITRITE NEGATIVE  LEUKOCYTESUR TRACE*      Imaging:  No results found.   Assessment & Plan: Ruth Johnson is a 75 y.o. white female with renal transplant in 2014 secondary to Granulomatosis with Polyangiits (Wegener's) , hypertension, hyperlipidemia, asthma/COPD, who was admitted to [facility] on 11/10/2020 for Gastroenteritis [K52.9] Gastroenteritis due to COVID-19 virus [U07.1, A08.39]  1. Acute kidney injury on chronic kidney disease stage IIIA-T. Baseline creatinine 1.11, GFR of 52 on 10/14/2020.  Transplant regimen of myfortic 360mg  bid and tacrolimus 1.5mg  bid (prednisone sparing regimen) Followed by [facility]  - holding home diuretics.  - IV fluids - tacrolimus trough level for tomorrow.   2. Hypertension: blood pressure at goal. Holding home losartan, triamterene and hydrochlorothiazide.   3. COVID-19 infection: supportive care. Requiring oxygen. Patient vaccinated.   4. Hematuria - check transplant ultrasound.     LOS: 0 Ruth Johnson 1/23/20221:30 PM

## 2020-11-10 NOTE — Consult Note (Signed)
Remdesivir - Pharmacy Brief Note  Covid + home test on 1/22 presenting with weakness and fatigue. O:  ALT: NNL  CXR: No imaging SpO2: 100% on ORA   A/P:  Remdesivir 200 mg IVPB once followed by 100 mg IVPB daily x 4 days.   Martyn Malay, [facility] 11/10/2020 9:53 AM

## 2020-11-10 NOTE — ED Notes (Signed)
Ultrasound at bedside. Pt walked to bathroom and back. Steady gait but states feels weak. Back in bed.

## 2020-11-10 NOTE — ED Notes (Signed)
Pt ambulated to bathroom. Pt back in bed. Nephrologist at bedside.

## 2020-11-10 NOTE — H&P (Signed)
History and Physical    Ruth Johnson:[id number] DOB: 12-21-1945 DOA: 11/10/2020  PCP: [facility]   Patient coming from: Home  I have personally briefly reviewed patient's old medical records in [facility]  Chief Complaint: Weakness and fatigue  HPI: Ruth Johnson is a 75 y.o. female with medical history significant for status post renal transplant in 2014, dyslipidemia, hypertension who presents to the emergency room for evaluation of generalized weakness and fatigue.  Patient has had nausea, vomiting and diarrhea for about 3 days as well as anorexia.  She is unsure if she had a fever but states that she had chills.  She has a cough which is nonproductive but denies having any shortness of breath, no orthopnea, no paroxysmal nocturnal dyspnea.  She denies having any abdominal pain.  Patient tested positive for COVID-19 virus from a home test done 01/22.  She is vaccinated against COVID-19 virus and has received her booster shot as well. She denies having any chest pain, no dizziness, no lightheadedness, no urinary frequency, no nocturia, no dysuria, no headache, no blurred vision. Labs show sodium 136, potassium 4.5, chloride 104, bicarb 16, glucose 142, BUN 71 above a baseline of 25, serum creatinine of 2.4 above a baseline of 1.1, anion gap 16, white count 7.0, hemoglobin 12, hematocrit 36.5, MCV 80.3, RDW 13.2, platelet count 181 Her SARS coronavirus 2 test is positive Twelve-lead EKG shows sinus rhythm with premature supraventricular complexes and nonspecific T wave changes   ED Course: Patient is a 75 year old Caucasian female status post renal transplant who presents to the ER for evaluation of a 3-day history of nausea, vomiting, anorexia and diarrhea.  Her COVID-19 PCR test is positive but patient is vaccinated against the virus.  Labs revealed acute kidney injury.  Patient will be admitted to the hospital for further evaluation.    Review of  Systems: As per HPI otherwise all systems reviewed and negative.    Past Medical History:  Diagnosis Date  . Asthma   . Full dentures    upper and lower  . High cholesterol   . Hypertension    sees Dr. Lady Gary.  . Renal disorder     Past Surgical History:  Procedure Laterality Date  . ABDOMINAL HYSTERECTOMY  1998  . CARDIAC CATHETERIZATION  1998   stents  . KIDNEY TRANSPLANT Right 05/17/13   [facility]  . TRIGGER FINGER RELEASE Right 03/06/2015   Procedure: Release of right long and ring trigger fingers;  Surgeon: Christena Flake, MD;  Location: [facility];  Service: Orthopedics;  Laterality: Right;     reports that she has never smoked. She has never used smokeless tobacco. She reports that she does not drink alcohol and does not use drugs.  No Known Allergies  History reviewed. No pertinent family history.   Prior to Admission medications   Medication Sig Start Date End Date Taking? Authorizing Provider  albuterol (PROVENTIL HFA;VENTOLIN HFA) 108 (90 BASE) MCG/ACT inhaler Inhale 2 puffs into the lungs every 4 (four) hours as needed for wheezing or shortness of breath.    [provider]  alendronate (FOSAMAX) 70 MG tablet Take 70 mg by mouth once a week. Take with a full glass of water on an empty stomach.    [provider]  amLODipine (NORVASC) 5 MG tablet Take 5 mg by mouth daily. PM    [provider]  aspirin 81 MG tablet Take 81 mg by mouth daily. AM  [provider]  atorvastatin (LIPITOR) 40 MG tablet Take 40 mg by mouth daily. PM    [provider]  Cholecalciferol (VITAMIN D PO) Take by mouth daily. AM    [provider]  HYDROcodone-acetaminophen (NORCO/VICODIN) 5-325 MG per tablet Take 1 tablet by mouth every 4 (four) hours as needed for moderate pain. 03/06/15   Poggi, Excell Seltzer, MD  losartan (COZAAR) 50 MG tablet Take 50 mg by mouth 2 (two) times daily.    [provider]  metoprolol tartrate (LOPRESSOR)  25 MG tablet .  25 mg by mouth 2 (two) times daily.    [provider]  mycophenolate (MYFORTIC) 180 MG EC tablet Take 180 mg by mouth 2 (two) times daily.    [provider]  tacrolimus (PROGRAF) 0.5 MG capsule Take 0.5 mg by mouth 2 (two) times daily.    [provider]  zolpidem (AMBIEN) 5 MG tablet Take 5 mg by mouth at bedtime as needed for sleep.    [provider]    Physical Exam: Vitals:   11/10/20 0059 11/10/20 0425 11/10/20 0730 11/10/20 0800  BP: 111/60 (!) 143/71 138/66 132/67  Pulse: 98 82 76 64  Resp: 18 20    Temp: (!) 97.5 F (36.4 C) 97.9 F (36.6 C)    TempSrc: Oral Oral    SpO2: 97% 100% 98% 98%  Weight:      Height:         Vitals:   11/10/20 0059 11/10/20 0425 11/10/20 0730 11/10/20 0800  BP: 111/60 (!) 143/71 138/66 132/67  Pulse: 98 82 76 64  Resp: 18 20    Temp: (!) 97.5 F (36.4 C) 97.9 F (36.6 C)    TempSrc: Oral Oral    SpO2: 97% 100% 98% 98%  Weight:      Height:        Constitutional: NAD, alert and oriented x 3.  Acutely ill-appearing weak Eyes: PERRL, lids and conjunctivae normal ENMT: Mucous membranes are dry.  Neck: normal, supple, no masses, no thyromegaly Respiratory: clear to auscultation bilaterally, no wheezing, no crackles. Normal respiratory effort. No accessory muscle use.  Cardiovascular: Regular rate and rhythm, no murmurs / rubs / gallops. No extremity edema. 2+ pedal pulses. No carotid bruits.  Abdomen: no tenderness, no masses palpated. No hepatosplenomegaly. Bowel sounds positive.  Musculoskeletal: no clubbing / cyanosis. No joint deformity upper and lower extremities.  Skin: no rashes, lesions, ulcers.  Neurologic: No gross focal neurologic deficit. Psychiatric: Normal mood and affect.   Labs on Admission: I have personally reviewed following labs and imaging studies  CBC: Recent Labs  Lab 11/09/20 1855  WBC 7.0  HGB 12.0  HCT 36.5  MCV 80.2  PLT 181   Basic Metabolic  Panel: Recent Labs  Lab 11/09/20 1855  NA 136  K 4.5  CL 104  CO2 16*  GLUCOSE 142*  BUN 71*  CREATININE 2.40*  CALCIUM 9.1   GFR: Estimated Creatinine Clearance: 15.3 mL/min (A) (by C-G formula based on SCr of 2.4 mg/dL (H)). Liver Function Tests: No results for input(s): AST, ALT, ALKPHOS, BILITOT, PROT, ALBUMIN in the last 168 hours. No results for input(s): LIPASE, AMYLASE in the last 168 hours. No results for input(s): AMMONIA in the last 168 hours. Coagulation Profile: No results for input(s): INR, PROTIME in the last 168 hours. Cardiac Enzymes: No results for input(s): CKTOTAL, CKMB, CKMBINDEX, TROPONINI in the last 168 hours. BNP (last 3 results) No results for input(s):  PROBNP in the last 8760 hours. HbA1C: No results for input(s): HGBA1C in the last 72 hours. CBG: No results for input(s): GLUCAP in the last 168 hours. Lipid Profile: No results for input(s): CHOL, HDL, LDLCALC, TRIG, CHOLHDL, LDLDIRECT in the last 72 hours. Thyroid Function Tests: No results for input(s): TSH, T4TOTAL, FREET4, T3FREE, THYROIDAB in the last 72 hours. Anemia Panel: No results for input(s): VITAMINB12, FOLATE, FERRITIN, TIBC, IRON, RETICCTPCT in the last 72 hours. Urine analysis:    Component Value Date/Time   COLORURINE Yellow 06/05/2013 0836   APPEARANCEUR Clear 06/05/2013 0836   LABSPEC 1.011 06/05/2013 0836   PHURINE 8.0 06/05/2013 0836   GLUCOSEU Negative 06/05/2013 0836   HGBUR 2+ 06/05/2013 0836   BILIRUBINUR Negative 06/05/2013 0836   KETONESUR Negative 06/05/2013 0836   PROTEINUR 30 mg/dL 50/27/7412 8786   NITRITE Negative 06/05/2013 0836   LEUKOCYTESUR Negative 06/05/2013 0836    Radiological Exams on Admission: No results found.  EKG: Independently reviewed.  Sinus rhythm with PVCs Nonspecific T wave changes  Assessment/Plan Principal Problem:   Gastroenteritis due to COVID-19 virus Active Problems:   Gastroenteritis   AKI (acute kidney injury) (HCC)    Renal transplant, status post   Essential hypertension     Gastroenteritis due to COVID-19 virus Patient presents with a 3-day history of nausea, vomiting and diarrhea She tested positive for the COVID-19 virus She is fully vaccinated against COVID-19 We will treat patient empirically with remdesivir No indication for oxygen at this time since she is not hypoxic and has no evidence of COVID-19 pneumonia    Acute kidney injury Secondary to GI losses from nausea, vomiting and diarrhea At baseline patient has a serum creatinine of 1.1 and today on admission it is 2.40 Hold losartan Avoid nephrotoxic agents IV fluid hydration Repeat renal parameters in a.m.    Status post renal transplant Continue mycophenolate and tacrolimus We will request nephrology consult    Hypertension Continue metoprolol and amlodipine      DVT prophylaxis: Lovenox Code Status: Full code Family Communication: Greater than 50% of time spent discussing patient's condition and plan of care with her at the bedside.  All questions and concerns have been addressed.  She verbalizes understanding and agrees to the plan. Disposition Plan: Back to previous home environment Consults called: Nephrology    Lucile Shutters MD [facility]     11/10/2020, 9:37 AM

## 2020-11-10 NOTE — ED Provider Notes (Addendum)
[facility] Emergency Department Provider Note  ____________________________________________   Event Date/Time   First MD Initiated Contact with Patient 11/10/20 [phone]     (approximate)  I have reviewed the triage vital signs and the nursing notes.   HISTORY  Chief Complaint Weakness and Fatigue    HPI Ruth Johnson is a 75 y.o. female hypertension, hyperlipidemia, renal transplant 2014 at [facility] who presents to the emergency department generalized weakness, fatigue for the past 3 weeks.  For the past 2 to 3 days she has had nausea, vomiting, diarrhea.  No cough, chest pain or shortness of breath.  She is unsure if she has had fever.  She has been vaccinated for COVID-19.  Reports she had a home positive COVID test yesterday.        Past Medical History:  Diagnosis Date  . Asthma   . Full dentures    upper and lower  . High cholesterol   . Hypertension    sees Dr. Lady Gary.  . Renal disorder     Patient Active Problem List   Diagnosis Date Noted  . Trigger middle finger of right hand 03/06/2015    Class: Chronic  . Trigger ring finger of right hand 03/06/2015    Class: Chronic    Past Surgical History:  Procedure Laterality Date  . ABDOMINAL HYSTERECTOMY  1998  . CARDIAC CATHETERIZATION  1998   stents  . KIDNEY TRANSPLANT Right 05/17/13   [facility]  . TRIGGER FINGER RELEASE Right 03/06/2015   Procedure: Release of right long and ring trigger fingers;  Surgeon: Christena Flake, MD;  Location: [facility];  Service: Orthopedics;  Laterality: Right;    Prior to Admission medications   Medication Sig Start Date End Date Taking? Authorizing Provider  albuterol (PROVENTIL HFA;VENTOLIN HFA) 108 (90 BASE) MCG/ACT inhaler Inhale 2 puffs into the lungs every 4 (four) hours as needed for wheezing or shortness of breath.    [provider]  alendronate (FOSAMAX) 70 MG tablet Take 70 mg by mouth once a week. Take with a full glass of water on  an empty stomach.    [provider]  amLODipine (NORVASC) 5 MG tablet Take 5 mg by mouth daily. PM    [provider]  aspirin 81 MG tablet Take 81 mg by mouth daily. AM    [provider]  atorvastatin (LIPITOR) 40 MG tablet Take 40 mg by mouth daily. PM    [provider]  Cholecalciferol (VITAMIN D PO) Take by mouth daily. AM    [provider]  HYDROcodone-acetaminophen (NORCO/VICODIN) 5-325 MG per tablet Take 1 tablet by mouth every 4 (four) hours as needed for moderate pain. 03/06/15   Poggi, Excell Seltzer, MD  losartan (COZAAR) 50 MG tablet Take 50 mg by mouth 2 (two) times daily.    [provider]  metoprolol tartrate (LOPRESSOR) 25 MG tablet Take 25 mg by mouth 2 (two) times daily.    [provider]  mycophenolate (MYFORTIC) 180 MG EC tablet Take 180 mg by mouth 2 (two) times daily.    [provider]  tacrolimus (PROGRAF) 0.5 MG capsule Take 0.5 mg by mouth 2 (two) times daily.    [provider]  zolpidem (AMBIEN) 5 MG tablet Take 5 mg by mouth at bedtime as needed for sleep.    [provider]    Allergies Patient has no known allergies.  History reviewed. No pertinent family history.  Social History  Social History   Tobacco Use  . Smoking status: Never Smoker  . Smokeless tobacco: Never Used  Substance Use Topics  . Alcohol use: No  . Drug use: Never    Review of Systems Constitutional: No fever. Eyes: No visual changes. ENT: No sore throat. Cardiovascular: Denies chest pain. Respiratory: Denies shortness of breath. Gastrointestinal: + nausea, vomiting, diarrhea. Genitourinary: Negative for dysuria. Musculoskeletal: Negative for back pain. Skin: Negative for rash. Neurological: Negative for focal weakness or numbness.  ____________________________________________   PHYSICAL EXAM:  VITAL SIGNS: ED Triage Vitals  Enc Vitals Group     BP 11/09/20 1840 122/65     Pulse Rate  11/09/20 1840 (!) 105     Resp 11/09/20 1840 16     Temp 11/09/20 1840 98.4 F (36.9 C)     Temp Source 11/09/20 1840 Oral     SpO2 11/09/20 1840 98 %     Weight 11/09/20 1841 115 lb (52.2 kg)     Height 11/09/20 1841 5\' 1"  (1.549 m)     Head Circumference --      Peak Flow --      Pain Score 11/09/20 1841 6     Pain Loc --      Pain Edu? --      Excl. in GC? --    CONSTITUTIONAL: Alert and oriented and responds appropriately to questions. Well-appearing; well-nourished HEAD: Normocephalic EYES: Conjunctivae clear, pupils appear equal, EOM appear intact ENT: normal nose; moist mucous membranes NECK: Supple, normal ROM CARD: RRR; S1 and S2 appreciated; no murmurs, no clicks, no rubs, no gallops RESP: Normal chest excursion without splinting or tachypnea; breath sounds clear and equal bilaterally; no wheezes, no rhonchi, no rales, no hypoxia or respiratory distress, speaking full sentences ABD/GI: Normal bowel sounds; non-distended; soft, non-tender, no rebound, no guarding, no peritoneal signs, no hepatosplenomegaly BACK: The back appears normal EXT: Normal ROM in all joints; no deformity noted, no edema; no cyanosis SKIN: Normal color for age and race; warm; no rash on exposed skin NEURO: Moves all extremities equally PSYCH: The patient's mood and manner are appropriate.  ____________________________________________   LABS (all labs ordered are listed, but only abnormal results are displayed)  Labs Reviewed  BASIC METABOLIC PANEL - Abnormal; Notable for the following components:      Result Value   CO2 16 (*)    Glucose, Bld 142 (*)    BUN 71 (*)    Creatinine, Ser 2.40 (*)    GFR, Estimated 21 (*)    Anion gap 16 (*)    All other components within normal limits  CBC  URINALYSIS, COMPLETE (UACMP) WITH MICROSCOPIC  URINALYSIS, ROUTINE W REFLEX MICROSCOPIC  POC SARS CORONAVIRUS 2 AG -  ED   ____________________________________________  EKG   EKG  Interpretation  Date/Time:  Saturday November 09 2020 18:46:56 EST Ventricular Rate:  114 PR Interval:    QRS Duration: 70 QT Interval:  310 QTC Calculation: 427 R Axis:   68 Text Interpretation: Sinus rhythm with Premature supraventricular complexes Nonspecific T wave abnormality Abnormal ECG No old tracing to compare Confirmed by 10-12-1990 [phone]) on 11/10/2020 4:47:37 AM       ____________________________________________  RADIOLOGY 11/12/2020 Jaeden Westbay, personally viewed and evaluated these images (plain radiographs) as part of my medical decision making, as well as reviewing the written report by the radiologist.  ED MD interpretation:  none  Official radiology report(s): No results found.  ____________________________________________   PROCEDURES  Procedure(s) performed (  including Critical Care):  Procedures    ____________________________________________   INITIAL IMPRESSION / ASSESSMENT AND PLAN / ED COURSE  As part of my medical decision making, I reviewed the following data within the electronic MEDICAL RECORD NUMBER Nursing notes reviewed and incorporated, Labs reviewed and shows acute renal failure, EKG interpreted NSR, Old EKG reviewed, Old chart reviewed, Discussed with admitting physician Dr. Para March, A consult was requested and obtained from this/these consultant(s) [facility] nephrology and Notes from prior ED visits         Patient here with generalized weakness, fatigue for 3 weeks.  Reports nausea, vomiting diarrhea for the past 2 to 3 days.  Reports positive COVID test at home.  Afebrile here, hemodynamically stable.  Abdominal exam benign.  Denies chest pain, shortness of breath, cough.  Has been vaccinated for COVID-19.  Basic labs do show acute kidney injury.  She has history of renal transplant.  Recommend IV fluids as I suspect this is prerenal in nature.  Will obtain urinalysis, COVID test here.  EKG is nonischemic.  She will need admission.  Patient had her  transplant [facility] in 2014.  I did contact the [facility] transfer center as patient would like to be transferred there given "that is where all of my doctors are".  Patient would be managed by the nephrology team there given she is more than 1 month out from her transplant.  Nephrology states they do not have the capacity care for patient at [facility] at this time.  Will discuss with medicine here for admission.    5:29 AM Discussed patient's case with hospitalist, Dr. Para March.  I have recommended admission and patient (and family if present) agree with this plan. Admitting physician will place admission orders.   I reviewed all nursing notes, vitals, pertinent previous records and reviewed/interpreted all EKGs, lab and urine results, imaging (as available).    ____________________________________________   FINAL CLINICAL IMPRESSION(S) / ED DIAGNOSES  Final diagnoses:  AKI (acute kidney injury) [facility])     ED Discharge Orders    None      *Please note:  Ruth Johnson was evaluated in Emergency Department on 11/10/2020 for the symptoms described in the history of present illness. She was evaluated in the context of the global COVID-19 pandemic, which necessitated consideration that the patient might be at risk for infection with the SARS-CoV-2 virus that causes COVID-19. Institutional protocols and algorithms that pertain to the evaluation of patients at risk for COVID-19 are in a state of rapid change based on information released by regulatory bodies including the CDC and federal and state organizations. These policies and algorithms were followed during the patient's care in the ED.  Some ED evaluations and interventions may be delayed as a result of limited staffing during and the pandemic.*   Note:  This document was prepared using Dragon voice recognition software and may include unintentional dictation errors.       Gwendolyne Welford, Layla Maw, DO 11/10/20 [phone]

## 2020-11-11 LAB — CBC WITH DIFFERENTIAL/PLATELET
Abs Immature Granulocytes: 0.02 10*3/uL (ref 0.00–0.07)
Basophils Absolute: 0 10*3/uL (ref 0.0–0.1)
Basophils Relative: 0 %
Eosinophils Absolute: 0 10*3/uL (ref 0.0–0.5)
Eosinophils Relative: 0 %
HCT: 34.4 % — ABNORMAL LOW (ref 36.0–46.0)
Hemoglobin: 11.6 g/dL — ABNORMAL LOW (ref 12.0–15.0)
Immature Granulocytes: 0 %
Lymphocytes Relative: 17 %
Lymphs Abs: 0.8 10*3/uL (ref 0.7–4.0)
MCH: 27.2 pg (ref 26.0–34.0)
MCHC: 33.7 g/dL (ref 30.0–36.0)
MCV: 80.8 fL (ref 80.0–100.0)
Monocytes Absolute: 0.5 10*3/uL (ref 0.1–1.0)
Monocytes Relative: 9 %
Neutro Abs: 3.6 10*3/uL (ref 1.7–7.7)
Neutrophils Relative %: 74 %
Platelets: 176 10*3/uL (ref 150–400)
RBC: 4.26 MIL/uL (ref 3.87–5.11)
RDW: 13.7 % (ref 11.5–15.5)
WBC: 5 10*3/uL (ref 4.0–10.5)
nRBC: 0 % (ref 0.0–0.2)

## 2020-11-11 LAB — FIBRIN DERIVATIVES D-DIMER (ARMC ONLY): Fibrin derivatives D-dimer (ARMC): 426.72 ng{FEU}/mL (ref 0.00–499.00)

## 2020-11-11 LAB — COMPREHENSIVE METABOLIC PANEL
ALT: 14 U/L (ref 0–44)
AST: 20 U/L (ref 15–41)
Albumin: 3.1 g/dL — ABNORMAL LOW (ref 3.5–5.0)
Alkaline Phosphatase: 52 U/L (ref 38–126)
Anion gap: 12 (ref 5–15)
BUN: 60 mg/dL — ABNORMAL HIGH (ref 8–23)
CO2: 14 mmol/L — ABNORMAL LOW (ref 22–32)
Calcium: 8.3 mg/dL — ABNORMAL LOW (ref 8.9–10.3)
Chloride: 115 mmol/L — ABNORMAL HIGH (ref 98–111)
Creatinine, Ser: 1.36 mg/dL — ABNORMAL HIGH (ref 0.44–1.00)
GFR, Estimated: 41 mL/min — ABNORMAL LOW (ref >60)
Glucose, Bld: 92 mg/dL (ref 70–99)
Potassium: 4.2 mmol/L (ref 3.5–5.1)
Sodium: 141 mmol/L (ref 135–145)
Total Bilirubin: 0.6 mg/dL (ref 0.3–1.2)
Total Protein: 5.6 g/dL — ABNORMAL LOW (ref 6.5–8.1)

## 2020-11-11 LAB — MAGNESIUM: Magnesium: 1.5 mg/dL — ABNORMAL LOW (ref 1.7–2.4)

## 2020-11-11 LAB — C DIFFICILE QUICK SCREEN W PCR REFLEX
C Diff antigen: NEGATIVE
C Diff interpretation: NOT DETECTED
C Diff toxin: NEGATIVE

## 2020-11-11 LAB — FERRITIN: Ferritin: 306 ng/mL (ref 11–307)

## 2020-11-11 LAB — PHOSPHORUS: Phosphorus: 4.1 mg/dL (ref 2.5–4.6)

## 2020-11-11 LAB — C-REACTIVE PROTEIN: CRP: 1 mg/dL — ABNORMAL HIGH (ref <1.0)

## 2020-11-11 MED ORDER — MAGNESIUM OXIDE 400 (241.3 MG) MG PO TABS
800.0000 mg | ORAL_TABLET | Freq: Once | ORAL | Status: AC
  Administered 2020-11-11: 15:00:00 800 mg via ORAL
  Filled 2020-11-11: qty 2

## 2020-11-11 MED ORDER — TACROLIMUS 1 MG PO CAPS: 1.5000 mg | ORAL_CAPSULE | Freq: Every morning | ORAL | Status: DC

## 2020-11-11 MED ORDER — MAGNESIUM SULFATE 2 GM/50ML IV SOLN: 2.0000 g | Freq: Once | INTRAVENOUS | Status: AC

## 2020-11-11 MED ORDER — TACROLIMUS 1 MG PO CAPS
1.0000 mg | ORAL_CAPSULE | Freq: Every day | ORAL | Status: DC
  Filled 2020-11-11: qty 1

## 2020-11-11 NOTE — Unmapped (Signed)
Patient went to [facility] Saturday with COVID. Daughter-in-law Geraldine Contras called on-call coordinator to ask if she could be transferred. Informed only admitting hospital can initiate a transfer and not with COVID + patients in all likelihood. Per care everywhere pt was dehydrated when admitted with creatinine of 2.40 on 1/22 today creatinine down to 1.36. Dee wanted Dr. Toni Arthurs to be advised of Shaliyah's admission. Updated primary coordinator.

## 2020-11-11 NOTE — Unmapped (Signed)
Dee, pt's daughter in law called [facility].  She reports that pt is being discharged from [facility]. The admitting doctor told pt and son that pt needs to get mab infusion.  Geraldine Contras reports that pt has had covid symptoms for over almost 2 weeks and tested positive 3 days ago.   Geraldine Contras was informed that pt does not meet criteria for mab infusion as she is outside of 5 day window. She states understanding.    Reviewed with Dr. Toni Arthurs- pt to hold Myfortic for 1 week if having symptoms still. Geraldine Contras was made aware of this and will have pt stop Myfortic for 1 week.

## 2020-11-11 NOTE — Discharge Instructions (Signed)
COVID-19: What Your Test Results Mean If you test positive for COVID-19 Take steps to protect others regardless of your COVID-19 vaccination status Stay home.  Isolate at home for at least 10 days. Stay in a specific room and away from other people in your home. Get rest and stay hydrated. If you develop symptoms, continue to isolate for at least 10 days after symptoms began and until you do not have a fever without using medications to reduce fever. Stay in touch with your doctor. Contact your doctor as soon as possible if you are an older adult or have underlying medical conditions. Contact your doctor or health department about isolation if you  Are severely ill or have a weakened immune system.  Had a positive test result followed by a negative result.  Test positive for many weeks. If you test negative for COVID-19:  The virus was not detected. If you have symptoms of COVID-19:  You may have received a false negative test result and still might have COVID-19.  Isolate from others. If you do not have symptoms of COVID-19 and you were exposed to a person with COVID-19:  You are likely not infected, but you still may get sick.  Contact your doctor about your symptoms, about follow-up testing, and how long to isolate.  Self-quarantine for 14 days at home after your exposure.  If you are fully vaccinated, you do not need to self quarantine.  Contact your doctor or local health department regarding options to reduce the length of your quarantine. A negative test result does not mean you won't get sick later. [email or web address] 07/16/2020 This information is not intended to replace advice given to you by your health care provider. Make sure you discuss any questions you have with your health care provider. Document Revised: 08/19/2020 Document Reviewed: 08/19/2020 Elsevier Patient Education  2021 Elsevier Inc.  COVID-19 Quarantine vs. Isolation QUARANTINE keeps someone who  was in close contact with someone who has COVID-19 away from others. Quarantine if you have been in close contact with someone who has COVID-19, unless you have been fully vaccinated. If you are fully vaccinated  You do NOT need to quarantine unless they have symptoms  Get tested 3-5 days after your exposure, even if you don't have symptoms  Wear a mask indoors in public for 14 days following exposure or until your test result is negative If you are not fully vaccinated  Stay home for 14 days after your last contact with a person who has COVID-19  Watch for fever (100.64F), cough, shortness of breath, or other symptoms of COVID-19  If possible, stay away from people you live with, especially people who are at higher risk for getting very sick from COVID-19  Contact your local public health department for options in your area to possibly shorten your quarantine ISOLATION keeps someone who is sick or tested positive for COVID-19 without symptoms away from others, even in their own home. People who are in isolation should stay home and stay in a specific "sick room" or area and use a separate bathroom (if available). If you are sick and think or know you have COVID-19 Stay home until after  At least 10 days since symptoms first appeared and  At least 24 hours with no fever without the use of fever-reducing medications and  Symptoms have improved If you tested positive for COVID-19 but do not have symptoms  Stay home until after 10 days have passed since your positive viral test  If you develop symptoms after testing positive, follow the steps above for those who are sick [email or web address] 07/15/2020 This information is not intended to replace advice given to you by your health care provider. Make sure you discuss any questions you have with your health care provider. Document Revised: 08/19/2020 Document Reviewed: 08/19/2020 Elsevier Patient Education  2021 Elsevier  Inc.  COVID-19: What to Do if You Are Sick If you have a fever, cough or other symptoms, you might have COVID-19. Most people have mild illness and are able to recover at home. If you are sick:  Keep track of your symptoms.  If you have an emergency warning sign (including trouble breathing), call 911. Steps to help prevent the spread of COVID-19 if you are sick If you are sick with COVID-19 or think you might have COVID-19, follow the steps below to care for yourself and to help protect other people in your home and community. Stay home except to get medical care  Stay home. Most people with COVID-19 have mild illness and can recover at home without medical care. Do not leave your home, except to get medical care. Do not visit public areas.  Take care of yourself. Get rest and stay hydrated. Take over-the-counter medicines, such as acetaminophen, to help you feel better.  Stay in touch with your doctor. Call before you get medical care. Be sure to get care if you have trouble breathing, or have any other emergency warning signs, or if you think it is an emergency.  Avoid public transportation, ride-sharing, or taxis. Separate yourself from other people As much as possible, stay in a specific room and away from other people and pets in your home. If possible, you should use a separate bathroom. If you need to be around other people or animals in or outside of the home, wear a mask. Tell your close contactsthat they may have been exposed to COVID-19. An infected person can spread COVID-19 starting 48 hours (or 2 days) before the person has any symptoms or tests positive. By letting your close contacts know they may have been exposed to COVID-19, you are helping to protect everyone.  Additional guidance is available for those living in close quarters and shared housing.  See COVID-19 and Animals if you have questions about pets.  If you are diagnosed with COVID-19, someone from the health  department may call you. Answer the call to slow the spread. Monitor your symptoms  Symptoms of COVID-19 include fever, cough, or other symptoms.  Follow care instructions from your healthcare provider and local health department. Your local health authorities may give instructions on checking your symptoms and reporting information. When to seek emergency medical attention Look for emergency warning signs* for COVID-19. If someone is showing any of these signs, seek emergency medical care immediately:  Trouble breathing  Persistent pain or pressure in the chest  New confusion  Inability to wake or stay awake  Pale, gray, or blue-colored skin, lips, or nail beds, depending on skin tone *This list is not all possible symptoms. Please call your medical provider for any other symptoms that are severe or concerning to you. Call 911 or call ahead to your local emergency facility: Notify the operator that you are seeking care for someone who has or may have COVID-19. Call ahead before visiting your doctor  Call ahead. Many medical visits for routine care are being postponed or done by phone or telemedicine.  If you have a medical appointment that cannot be  postponed, call your doctor's office, and tell them you have or may have COVID-19. This will help the office protect themselves and other patients. Get  tested  If you have symptoms of COVID-19, get tested. While waiting for test results, you stay away from others, including staying apart from those living in your household.  You can visit your state, tribal, local, and territorialhealth department's website to look for the latest local information on testing sites. If you are sick, wear a mask over your nose and mouth  You should wear a mask over your nose and mouth if you must be around other people or animals, including pets (even at home).  You don't need to wear the mask if you are alone. If you can't put on a mask (because of  trouble breathing, for example), cover your coughs and sneezes in some other way. Try to stay at least 6 feet away from other people. This will help protect the people around you.  Masks should not be placed on young children under age 34 years, anyone who has trouble breathing, or anyone who is not able to remove the mask without help. Note: During the COVID-19 pandemic, medical grade facemasks are reserved for healthcare workers and some first responders. Cover your coughs and sneezes  Cover your mouth and nose with a tissue when you cough or sneeze.  Throw away used tissues in a lined trash can.  Immediately wash your hands with soap and water for at least 20 seconds. If soap and water are not available, clean your hands with an alcohol-based hand sanitizer that contains at least 60% alcohol. Clean your hands often  Wash your hands often with soap and water for at least 20 seconds. This is especially important after blowing your nose, coughing, or sneezing; going to the bathroom; and before eating or preparing food.  Use hand sanitizer if soap and water are not available. Use an alcohol-based hand sanitizer with at least 60% alcohol, covering all surfaces of your hands and rubbing them together until they feel dry.  Soap and water are the best option, especially if hands are visibly dirty.  Avoid touching your eyes, nose, and mouth with unwashed hands.  Handwashing Tips Avoid sharing personal household items  Do not share dishes, drinking glasses, cups, eating utensils, towels, or bedding with other people in your home.  Wash these items thoroughly after using them with soap and water or put in the dishwasher. Clean all "high-touch" surfaces everyday  Clean and disinfect high-touch surfaces in your "sick room" and bathroom; wear disposable gloves. Let someone else clean and disinfect surfaces in common areas, but you should clean your bedroom and bathroom, if possible.  If a  caregiver or other person needs to clean and disinfect a sick person's bedroom or bathroom, they should do so on an as-needed basis. The caregiver/other person should wear a mask and disposable gloves prior to cleaning. They should wait as long as possible after the person who is sick has used the bathroom before coming in to clean and use the bathroom. ? High-touch surfaces include phones, remote controls, counters, tabletops, doorknobs, bathroom fixtures, toilets, keyboards, tablets, and bedside tables.  Clean and disinfect areas that may have blood, stool, or body fluids on them.  Use household cleaners and disinfectants. Clean the area or item with soap and water or another detergent if it is dirty. Then, use a household disinfectant. ? Be sure to follow the instructions on the label to ensure  safe and effective use of the product. Many products recommend keeping the surface wet for several minutes to ensure germs are killed. Many also recommend precautions such as wearing gloves and making sure you have good ventilation during use of the product. ? Use a product from [employer] List N: Disinfectants for Coronavirus (COVID-19). ? Complete Disinfection Guidance When you can be around others after being sick with COVID-19 Deciding when you can be around others is different for different situations. Find out when you can safely end home isolation. For any additional questions about your care, contact your healthcare provider or state or local health department. 01/03/2020 Content source: [facility] ([facility]), Division of Viral Diseases This information is not intended to replace advice given to you by your health care provider. Make sure you discuss any questions you have with your health care provider. Document Revised: 08/19/2020 Document Reviewed: 08/19/2020 Elsevier Patient Education  2021 Elsevier Inc.  10 Things You Can Do to Manage Your COVID-19  Symptoms at Home If you have possible or confirmed COVID-19: 1. Stay home except to get medical care. 2. Monitor your symptoms carefully. If your symptoms get worse, call your healthcare provider immediately. 3. Get rest and stay hydrated. 4. If you have a medical appointment, call the healthcare provider ahead of time and tell them that you have or may have COVID-19. 5. For medical emergencies, call 911 and notify the dispatch personnel that you have or may have COVID-19. 6. Cover your cough and sneezes with a tissue or use the inside of your elbow. 7. Wash your hands often with soap and water for at least 20 seconds or clean your hands with an alcohol-based hand sanitizer that contains at least 60% alcohol. 8. As much as possible, stay in a specific room and away from other people in your home. Also, you should use a separate bathroom, if available. If you need to be around other people in or outside of the home, wear a mask. 9. Avoid sharing personal items with other people in your household, like dishes, towels, and bedding. 10. Clean all surfaces that are touched often, like counters, tabletops, and doorknobs. Use household cleaning sprays or wipes according to the label instructions. [email or web address] 05/03/2020 This information is not intended to replace advice given to you by your health care provider. Make sure you discuss any questions you have with your health care provider. Document Revised: 08/19/2020 Document Reviewed: 08/19/2020 Elsevier Patient Education  2021 Elsevier Inc.    Person Under Monitoring Name: Ruth Johnson  Location: [address] [place] [address]   Infection Prevention Recommendations for Individuals Confirmed to have, or Being Evaluated for, 2019 Novel Coronavirus (COVID-19) Infection Who Receive Care at Home  Individuals who are confirmed to have, or are being evaluated for, COVID-19 should follow the prevention steps  below until a healthcare provider or local or state health department says they can return to normal activities.  Stay home except to get medical care You should restrict activities outside your home, except for getting medical care. Do not go to work, school, or public areas, and do not use public transportation or taxis.  Call ahead before visiting your doctor Before your medical appointment, call the healthcare provider and tell them that you have, or are being evaluated for, COVID-19 infection. This will help the healthcare providers office take steps to keep other people from getting infected. Ask your healthcare provider to call the local or  state health department.  Monitor your symptoms Seek prompt medical attention if your illness is worsening (e.g., difficulty breathing). Before going to your medical appointment, call the healthcare provider and tell them that you have, or are being evaluated for, COVID-19 infection. Ask your healthcare provider to call the local or state health department.  Wear a facemask You should wear a facemask that covers your nose and mouth when you are in the same room with other people and when you visit a healthcare provider. People who live with or visit you should also wear a facemask while they are in the same room with you.  Separate yourself from other people in your home As much as possible, you should stay in a different room from other people in your home. Also, you should use a separate bathroom, if available.  Avoid sharing household items You should not share dishes, drinking glasses, cups, eating utensils, towels, bedding, or other items with other people in your home. After using these items, you should wash them thoroughly with soap and water.  Cover your coughs and sneezes Cover your mouth and nose with a tissue when you cough or sneeze, or you can cough or sneeze into your sleeve. Throw used tissues in a lined trash can, and  immediately wash your hands with soap and water for at least 20 seconds or use an alcohol-based hand rub.  Wash your [employer] your hands often and thoroughly with soap and water for at least 20 seconds. You can use an alcohol-based hand sanitizer if soap and water are not available and if your hands are not visibly dirty. Avoid touching your eyes, nose, and mouth with unwashed hands.   Prevention Steps for Caregivers and Household Members of Individuals Confirmed to have, or Being Evaluated for, COVID-19 Infection Being Cared for in the Home  If you live with, or provide care at home for, a person confirmed to have, or being evaluated for, COVID-19 infection please follow these guidelines to prevent infection:  Follow healthcare providers instructions Make sure that you understand and can help the patient follow any healthcare provider instructions for all care.  Provide for the patients basic needs You should help the patient with basic needs in the home and provide support for getting groceries, prescriptions, and other personal needs.  Monitor the patients symptoms If they are getting sicker, call his or her medical provider and tell them that the patient has, or is being evaluated for, COVID-19 infection. This will help the healthcare providers office take steps to keep other people from getting infected. Ask the healthcare provider to call the local or state health department.  Limit the number of people who have contact with the patient  If possible, have only one caregiver for the patient.  Other household members should stay in another home or place of residence. If this is not possible, they should stay  in another room, or be separated from the patient as much as possible. Use a separate bathroom, if available.  Restrict visitors who do not have an essential need to be in the home.  Keep older adults, very young children, and other sick people away from the  patient Keep older adults, very young children, and those who have compromised immune systems or chronic health conditions away from the patient. This includes people with chronic heart, lung, or kidney conditions, diabetes, and cancer.  Ensure good ventilation Make sure that shared spaces in the home have good air flow, such as  from an air conditioner or an opened window, weather permitting.  Wash your hands often  Wash your hands often and thoroughly with soap and water for at least 20 seconds. You can use an alcohol based hand sanitizer if soap and water are not available and if your hands are not visibly dirty.  Avoid touching your eyes, nose, and mouth with unwashed hands.  Use disposable paper towels to dry your hands. If not available, use dedicated cloth towels and replace them when they become wet.  Wear a facemask and gloves  Wear a disposable facemask at all times in the room and gloves when you touch or have contact with the patients blood, body fluids, and/or secretions or excretions, such as sweat, saliva, sputum, nasal mucus, vomit, urine, or feces.  Ensure the mask fits over your nose and mouth tightly, and do not touch it during use.  Throw out disposable facemasks and gloves after using them. Do not reuse.  Wash your hands immediately after removing your facemask and gloves.  If your personal clothing becomes contaminated, carefully remove clothing and launder. Wash your hands after handling contaminated clothing.  Place all used disposable facemasks, gloves, and other waste in a lined container before disposing them with other household waste.  Remove gloves and wash your hands immediately after handling these items.  Do not share dishes, glasses, or other household items with the patient  Avoid sharing household items. You should not share dishes, drinking glasses, cups, eating utensils, towels, bedding, or other items with a patient who is confirmed to have, or  being evaluated for, COVID-19 infection.  After the person uses these items, you should wash them thoroughly with soap and water.  Wash laundry thoroughly  Immediately remove and wash clothes or bedding that have blood, body fluids, and/or secretions or excretions, such as sweat, saliva, sputum, nasal mucus, vomit, urine, or feces, on them.  Wear gloves when handling laundry from the patient.  Read and follow directions on labels of laundry or clothing items and detergent. In general, wash and dry with the warmest temperatures recommended on the label.  Clean all areas the individual has used often  Clean all touchable surfaces, such as counters, tabletops, doorknobs, bathroom fixtures, toilets, phones, keyboards, tablets, and bedside tables, every day. Also, clean any surfaces that may have blood, body fluids, and/or secretions or excretions on them.  Wear gloves when cleaning surfaces the patient has come in contact with.  Use a diluted bleach solution (e.g., dilute bleach with 1 part bleach and 10 parts water) or a household disinfectant with a label that says EPA-registered for coronaviruses. To make a bleach solution at home, add 1 tablespoon of bleach to 1 quart (4 cups) of water. For a larger supply, add  cup of bleach to 1 gallon (16 cups) of water.  Read labels of cleaning products and follow recommendations provided on product labels. Labels contain instructions for safe and effective use of the cleaning product including precautions you should take when applying the product, such as wearing gloves or eye protection and making sure you have good ventilation during use of the product.  Remove gloves and wash hands immediately after cleaning.  Monitor yourself for signs and symptoms of illness Caregivers and household members are considered close contacts, should monitor their health, and will be asked to limit movement outside of the home to the extent possible. Follow the  monitoring steps for close contacts listed on the symptom monitoring form.   ?  If you have additional questions, contact your local health department or call the epidemiologist on call at [phone] (available 24/7). ? This guidance is subject to change. For the most up-to-date guidance from [facility], please refer to their website: http://www.martin.com/ Quarantine vs. Isolation QUARANTINE keeps someone who was in close contact with someone who has COVID-19 away from others. Quarantine if you have been in close contact with someone who has COVID-19, unless you have been fully vaccinated. If you are fully vaccinated  You do NOT need to quarantine unless they have symptoms  Get tested 3-5 days after your exposure, even if you don't have symptoms  Wear a mask indoors in public for 14 days following exposure or until your test result is negative If you are not fully vaccinated  Stay home for 14 days after your last contact with a person who has COVID-19  Watch for fever (100.57F), cough, shortness of breath, or other symptoms of COVID-19  If possible, stay away from people you live with, especially people who are at higher risk for getting very sick from COVID-19  Contact your local public health department for options in your area to possibly shorten your quarantine ISOLATION keeps someone who is sick or tested positive for COVID-19 without symptoms away from others, even in their own home. People who are in isolation should stay home and stay in a specific "sick room" or area and use a separate bathroom (if available). If you are sick and think or know you have COVID-19 Stay home until after  At least 10 days since symptoms first appeared and  At least 24 hours with no fever without the use of fever-reducing medications and  Symptoms have improved If you tested positive for COVID-19 but do not have symptoms  Stay home until  after 10 days have passed since your positive viral test  If you develop symptoms after testing positive, follow the steps above for those who are sick [email or web address] 07/15/2020 This information is not intended to replace advice given to you by your health care provider. Make sure you discuss any questions you have with your health care provider. Document Revised: 08/19/2020 Document Reviewed: 08/19/2020 Elsevier Patient Education  2021 [employer].

## 2020-11-11 NOTE — Progress Notes (Signed)
[facility]  ROUNDING NOTE   Subjective:   Patient complains of productive cough and shortness of breath. She is having some chest pains.   Creatinine 1.36 (2.4)  Objective:  Vital signs in last 24 hours:  Temp:  [97.9 F (36.6 C)-98.6 F (37 C)] 98.6 F (37 C) (01/24 1115) Pulse Rate:  [55-87] 63 (01/24 1115) Resp:  [13-20] 20 (01/24 1115) BP: (110-145)/(52-74) 110/64 (01/24 1115) SpO2:  [96 %-100 %] 98 % (01/24 1115)  Weight change:  Filed Weights   11/09/20 1841  Weight: 52.2 kg    Intake/Output: I/O last 3 completed shifts: In: 2500 [I.V.:1500; IV Piggyback:1000] Out: -    Intake/Output this shift:  No intake/output data recorded.  Physical Exam: General: NAD, sitting up in bed  Head: Normocephalic, atraumatic. Moist oral mucosal membranes  Eyes: Anicteric, PERRL  Neck: Supple, trachea midline  Lungs:  Bilateral rhonchi  Heart: Regular rate and rhythm  Abdomen:  Soft, nontender,   Extremities:  no peripheral edema.  Neurologic: Nonfocal, moving all four extremities  Skin: No lesions  Access: none    Basic Metabolic Panel: Recent Labs  Lab 11/09/20 1855 11/11/20 0508  NA 136 141  K 4.5 4.2  CL 104 115*  CO2 16* 14*  GLUCOSE 142* 92  BUN 71* 60*  CREATININE 2.40* 1.36*  CALCIUM 9.1 8.3*  MG  --  1.5*  PHOS  --  4.1    Liver Function Tests: Recent Labs  Lab 11/11/20 0508  AST 20  ALT 14  ALKPHOS 52  BILITOT 0.6  PROT 5.6*  ALBUMIN 3.1*   No results for input(s): LIPASE, AMYLASE in the last 168 hours. No results for input(s): AMMONIA in the last 168 hours.  CBC: Recent Labs  Lab 11/09/20 1855 11/11/20 0508  WBC 7.0 5.0  NEUTROABS  --  3.6  HGB 12.0 11.6*  HCT 36.5 34.4*  MCV 80.2 80.8  PLT 181 176    Cardiac Enzymes: No results for input(s): CKTOTAL, CKMB, CKMBINDEX, TROPONINI in the last 168 hours.  BNP: Invalid input(s): POCBNP  CBG: No results for input(s): GLUCAP in the last 168  hours.  Microbiology: No results found for this or any previous visit.  Coagulation Studies: No results for input(s): LABPROT, INR in the last 72 hours.  Urinalysis: Recent Labs    11/09/20 1127  COLORURINE YELLOW*  LABSPEC 1.011  PHURINE 5.0  GLUCOSEU NEGATIVE  HGBUR SMALL*  BILIRUBINUR NEGATIVE  KETONESUR NEGATIVE  PROTEINUR NEGATIVE  NITRITE NEGATIVE  LEUKOCYTESUR TRACE*      Imaging: US Renal Transplant w/Doppler  Result Date: 11/10/2020 CLINICAL DATA:  Acute renal insufficiency, right lower quadrant renal transplant EXAM: ULTRASOUND OF RENAL TRANSPLANT WITH RENAL DOPPLER ULTRASOUND TECHNIQUE: Ultrasound examination of the renal transplant was performed with gray-scale, color and duplex doppler evaluation. COMPARISON:  None. FINDINGS: Transplant kidney location: RLQ Transplant Kidney: Renal measurements: 10.1 x 4.1 x 4.6 cm = volume: 98.41mL. Normal in size and parenchymal echogenicity. No evidence of mass or hydronephrosis. No peri-transplant fluid collection seen. Color flow in the main renal artery:  Yes Color flow in the main renal vein:  Yes Duplex Doppler Evaluation: Main Renal Artery Velocity: 67.4 cm/sec Main Renal Artery Resistive Index: 0.85 Venous waveform in main renal vein:  Present Intrarenal resistive index in upper pole:  0.72 (normal 0.6-0.8; equivocal 0.8-0.9; abnormal >= 0.9) Intrarenal resistive index in lower pole: 0.73 (normal 0.6-0.8; equivocal 0.8-0.9; abnormal >= 0.9) Bladder: Patient voided just prior to the exam,  limiting evaluation of the bladder. Other findings: Native kidneys are atrophic, right kidney measuring 6.1 x 3.1 by 2.7 cm and the left kidney measuring 4.9 x 2.2 x 2.1 cm. IMPRESSION: 1. Unremarkable right lower quadrant transplant kidney, with normal Doppler interrogation. Electronically Signed   By: Sharlet Salina M.D.   On: 11/10/2020 16:44     Medications:   . sodium chloride    . magnesium sulfate bolus IVPB    . remdesivir 100 mg in  NS 100 mL 100 mg (11/11/20 0855)   . amLODipine  5 mg Oral Daily  . vitamin C  500 mg Oral Daily  . aspirin EC  81 mg Oral Daily  . atorvastatin  40 mg Oral Daily  . cholecalciferol  1,000 Units Oral Daily  . enoxaparin (LOVENOX) injection  30 mg Subcutaneous Q24H  . metoprolol tartrate  25 mg Oral BID  . mycophenolate  360 mg Oral BID  . sodium chloride flush  3 mL Intravenous Q12H  . [START ON 11/12/2020] tacrolimus  1.5 mg Oral q morning - 10a   And  . tacrolimus  1 mg Oral QHS  . zinc sulfate  220 mg Oral Daily   sodium chloride, albuterol, HYDROcodone-acetaminophen, ondansetron **OR** ondansetron (ZOFRAN) IV, sodium chloride flush, zolpidem  Assessment/ Plan:  Ruth Johnson is a 75 y.o. white female with renal transplant in 2014 secondary to Granulomatosis with Polyangiits (Wegener's) , hypertension, hyperlipidemia, asthma/COPD, who was admitted to [facility] on 11/10/2020 for Gastroenteritis [K52.9] Gastroenteritis due to COVID-19 virus [U07.1, A08.39]  1. Acute kidney injury on chronic kidney disease stage IIIA-T. Baseline creatinine 1.11, GFR of 52 on 10/14/2020.  Transplant regimen of myfortic 360mg  bid and tacrolimus 1.5mg  bid (prednisone sparing regimen) Followed by [facility]  - holding home diuretics.  - Encourage PO intake.  - tacrolimus trough level pending  2. Hypertension: blood pressure hypotensive. Holding home losartan, triamterene and hydrochlorothiazide.   3. COVID-19 infection: supportive care. Requiring oxygen. Patient vaccinated.    LOS: 1 Corra Kaine 1/24/20221:15 PM

## 2020-11-11 NOTE — Progress Notes (Signed)
Family member Ruth Johnson came to unit, upset and asked if her mother-in law was being treated with Remdesivir. Per Ruth Johnson, pt is not to be treated with this drug because it damages her kidneys. Per pt, the only people that can make decisions other than herself are son Ruth Johnson at [phone] and Daughter Ruth Johnson at [phone]. I spoke to [employer] and he states that he does not want her mother to be treated with Remdesivir because "its so bad on her kidneys" instead family wants to see if pt can be treated with "ivermectin if approved by her PCP in [place]." Son stated that he "has done research" and knows that Remdesivir is not for his mother, "It will not do anything for her except damage to her kidneys" MD Sherryll Burger made aware of this situation.

## 2020-11-11 NOTE — Progress Notes (Signed)
Pt to be DC home. DC documents reviewed and questions answered.

## 2020-11-11 NOTE — Plan of Care (Signed)

## 2020-11-11 NOTE — TOC Initial Note (Signed)
Transition of Care [facility]) - Initial/Assessment Note    Patient Details  Name: LORRENA GORANSON MRN: [id number] Date of Birth: June 18, 1946  Transition of Care [facility]) CM/SW Contact:    Allayne Butcher, RN Phone Number: 11/11/2020, 2:46 PM  Clinical Narrative:                 Patient admitted to the hospital with gastroenteritis related to COVID and AKI.  AKI resolved and patient is medically stable for discharge home.  Patient lives with her son Riley Lam.  Patient has no DME at home and has not needed any.  Riley Lam agrees to home health services being arranged and is good with [facility].  Barbara Cower with [facility] accepted home health referral for PT, OT, and aide.   Riley Lam will pick up patient when ready for discharge.   Expected Discharge Plan: Home w Home Health Services Barriers to Discharge: No Barriers Identified   Patient Goals and CMS Choice Patient states their goals for this hospitalization and ongoing recovery are:: To go home CMS Medicare.gov Compare Post Acute Care list provided to:: Patient Represenative (must comment) Choice offered to / list presented to : Adult Children  Expected Discharge Plan and Services Expected Discharge Plan: Home w Home Health Services   Discharge Planning Services: CM Consult Post Acute Care Choice: Home Health Living arrangements for the past 2 months: Mobile Home Expected Discharge Date: 11/11/20               DME Arranged: N/A DME Agency: NA       HH Arranged: PT,OT,Nurse's Aide HH Agency: [facility] Date HH Agency Contacted: 11/11/20 Time HH Agency Contacted: 1445 Representative spoke with at [facility] Agency: Barbara Cower  Prior Living Arrangements/Services Living arrangements for the past 2 months: Mobile Home Lives with:: Adult Children Patient language and need for interpreter reviewed:: Yes Do you feel safe going back to the place where you live?: Yes      Need for Family Participation in Patient Care: Yes (Comment)  (COVID) Care giver support system in place?: Yes (comment) (son and daughter)   Criminal Activity/Legal Involvement Pertinent to Current Situation/Hospitalization: No - Comment as needed  Activities of Daily Living Home Assistive Devices/Equipment: None ADL Screening (condition at time of admission) Patient's cognitive ability adequate to safely complete daily activities?: Yes Is the patient deaf or have difficulty hearing?: No Does the patient have difficulty seeing, even when wearing glasses/contacts?: No Does the patient have difficulty concentrating, remembering, or making decisions?: No Patient able to express need for assistance with ADLs?: No Does the patient have difficulty dressing or bathing?: No Independently performs ADLs?: Yes (appropriate for developmental age) Does the patient have difficulty walking or climbing stairs?: No Weakness of Legs: None Weakness of Arms/Hands: None  Permission Sought/Granted Permission sought to share information with : Case Manager,Family Supports,Other (comment) Permission granted to share information with : Yes, Verbal Permission Granted  Share Information with NAME: Riley Lam and Liborio Nixon  Permission granted to share info w AGENCY: [facility]  Permission granted to share info w Relationship: son and daughter  Permission granted to share info w Contact Information: [phone]  Emotional Assessment       Orientation: : Oriented to Self,Oriented to Place,Oriented to  Time,Oriented to Situation Alcohol / Substance Use: Not Applicable Psych Involvement: No (comment)  Admission diagnosis:  Acute kidney failure, unspecified (HCC) [N17.9] Gastroenteritis [K52.9] AKI (acute kidney injury) (HCC) [N17.9] Gastroenteritis due to COVID-19 virus [U07.1, A08.39] COVID-19 [U07.1] Patient Active Problem List  Diagnosis Date Noted  . Gastroenteritis 11/10/2020  . COVID-19 11/10/2020  . Acute renal failure (HCC) 11/10/2020  . Renal transplant,  status post 11/10/2020  . Essential hypertension 11/10/2020  . Trigger middle finger of right hand 03/06/2015    Class: Chronic  . Trigger ring finger of right hand 03/06/2015    Class: Chronic   PCP:  [facility]:   [facility] [address], [place] - [address] [place] [address] Phone: [phone] Fax: [phone]     Social Determinants of Health (SDOH) Interventions    Readmission Risk Interventions No flowsheet data found.

## 2020-11-12 DIAGNOSIS — U071 COVID-19: Principal | ICD-10-CM

## 2020-11-12 MED FILL — TRIAMTERENE 37.5 MG-HYDROCHLOROTHIAZIDE 25 MG CAPSULE: ORAL | 30 days supply | Qty: 30 | Fill #5

## 2020-11-12 MED FILL — MYFORTIC 180 MG TABLET,DELAYED RELEASE: ORAL | 30 days supply | Qty: 120 | Fill #11

## 2020-11-12 MED FILL — TACROLIMUS 0.5 MG CAPSULE, IMMEDIATE-RELEASE: ORAL | 30 days supply | Qty: 150 | Fill #1

## 2020-11-12 NOTE — Discharge Summary (Signed)
[place] at [facility]   PATIENT NAME: Ruth Johnson    MR#:  [id number]  DATE OF BIRTH:  July 13, 1946  DATE OF ADMISSION:  11/10/2020   ADMITTING PHYSICIAN: Andris Baumann, MD  DATE OF DISCHARGE: 11/11/2020  4:00 PM  PRIMARY CARE PHYSICIAN: [place], [facility]   ADMISSION DIAGNOSIS:  Acute kidney failure, unspecified (HCC) [N17.9] Gastroenteritis [K52.9] AKI (acute kidney injury) (HCC) [N17.9] Gastroenteritis due to COVID-19 virus [U07.1, A08.39] COVID-19 [U07.1] DISCHARGE DIAGNOSIS:  Principal Problem:   COVID-19 Active Problems:   Gastroenteritis   Acute renal failure (HCC)   Renal transplant, status post   Essential hypertension  SECONDARY DIAGNOSIS:   Past Medical History:  Diagnosis Date  . Asthma   . Full dentures    upper and lower  . High cholesterol   . Hypertension    sees Dr. Lady Gary.  . Renal disorder    HOSPITAL COURSE:  Ruth Johnson is a 75 y.o. female with medical history significant for status post renal transplant in 2014, dyslipidemia, hypertension who presents to the emergency room for evaluation of generalized weakness and fatigue.  Patient has had nausea, vomiting and diarrhea for about 3 days as well as anorexia.  Gastroenteritis due to covid-19 Her symptoms improved with no further vomiting/diarrhea in the hospital. She was tolerating diet fine. Unfortunately her son/family requested not to give remdesevir (as they strongly feel it will demage her kidneys per their research) and they requested her to be D/C so as they can treat her as an outpt with MAB infusion and ivermectin as an outpt. Stool studies were neg and she didn't have much respi. Symptoms or need of O2.  I told son, in the hospital - neither MAB infusion or Ivermectin are possible. They understood and requested DC home.  AKI Improved with hydration.   DISCHARGE CONDITIONS:  stable CONSULTS OBTAINED:   DRUG ALLERGIES:  No Known  Allergies DISCHARGE MEDICATIONS:   Allergies as of 11/11/2020   No Known Allergies     Medication List    STOP taking these medications   losartan 50 MG tablet Commonly known as: COZAAR   metoprolol tartrate 25 MG tablet Commonly known as: LOPRESSOR   triamterene-hydrochlorothiazide 37.5-25 MG capsule Commonly known as: DYAZIDE     TAKE these medications   albuterol 108 (90 Base) MCG/ACT inhaler Commonly known as: VENTOLIN HFA Inhale 2 puffs into the lungs every 4 (four) hours as needed for wheezing or shortness of breath.   alendronate 70 MG tablet Commonly known as: FOSAMAX Take 70 mg by mouth once a week. Take with a full glass of water on an empty stomach.   amLODipine 2.5 MG tablet Commonly known as: NORVASC Take 2.5 mg by mouth daily.   aspirin 81 MG tablet Take 81 mg by mouth daily. AM   atorvastatin 40 MG tablet Commonly known as: LIPITOR Take 40 mg by mouth daily. PM   famotidine 20 MG tablet Commonly known as: PEPCID Take 20 mg by mouth 2 (two) times daily.   mycophenolate 180 MG EC tablet Commonly known as: MYFORTIC Take 360 mg by mouth 2 (two) times daily.   tacrolimus 0.5 MG capsule Commonly known as: PROGRAF Take 0.5 mg by mouth 2 (two) times daily.   VITAMIN D PO Take 1,000 Units by mouth daily. AM   zolpidem 5 MG tablet Commonly known as: AMBIEN Take 5 mg by mouth at bedtime as needed for sleep.      DISCHARGE  INSTRUCTIONS:   DIET:  Cardiac diet DISCHARGE CONDITION:  Stable ACTIVITY:  Activity as tolerated OXYGEN:  Home Oxygen: No.  Oxygen Delivery: room air DISCHARGE LOCATION:  home   If you experience worsening of your admission symptoms, develop shortness of breath, life threatening emergency, suicidal or homicidal thoughts you must seek medical attention immediately by calling 911 or calling your MD immediately  if symptoms less severe.  You Must read complete instructions/literature along with all the possible adverse  reactions/side effects for all the Medicines you take and that have been prescribed to you. Take any new Medicines after you have completely understood and accpet all the possible adverse reactions/side effects.   Please note  You were cared for by a hospitalist during your hospital stay. If you have any questions about your discharge medications or the care you received while you were in the hospital after you are discharged, you can call the unit and asked to speak with the hospitalist on call if the hospitalist that took care of you is not available. Once you are discharged, your primary care physician will handle any further medical issues. Please note that NO REFILLS for any discharge medications will be authorized once you are discharged, as it is imperative that you return to your primary care physician (or establish a relationship with a primary care physician if you do not have one) for your aftercare needs so that they can reassess your need for medications and monitor your lab values.    On the day of Discharge:  VITAL SIGNS:  Blood pressure (!) 95/40, pulse 64, temperature 98.6 F (37 C), temperature source Oral, resp. rate 18, height 5\' 1"  (1.549 m), weight 52.2 kg, SpO2 99 %. PHYSICAL EXAMINATION:  GENERAL:  75 y.o.-year-old patient lying in the bed with no acute distress.  EYES: Pupils equal, round, reactive to light and accommodation. No scleral icterus. Extraocular muscles intact.  HEENT: Head atraumatic, normocephalic. Oropharynx and nasopharynx clear.  NECK:  Supple, no jugular venous distention. No thyroid enlargement, no tenderness.  LUNGS: Normal breath sounds bilaterally, no wheezing, rales,rhonchi or crepitation. No use of accessory muscles of respiration.  CARDIOVASCULAR: S1, S2 normal. No murmurs, rubs, or gallops.  ABDOMEN: Soft, non-tender, non-distended. Bowel sounds present. No organomegaly or mass.  EXTREMITIES: No pedal edema, cyanosis, or clubbing.  NEUROLOGIC:  Cranial nerves II through XII are intact. Muscle strength 5/5 in all extremities. Sensation intact. Gait not checked.  PSYCHIATRIC: The patient is alert and oriented x 3.  SKIN: No obvious rash, lesion, or ulcer.  DATA REVIEW:   CBC Recent Labs  Lab 11/11/20 0508  WBC 5.0  HGB 11.6*  HCT 34.4*  PLT 176    Chemistries  Recent Labs  Lab 11/11/20 0508  NA 141  K 4.2  CL 115*  CO2 14*  GLUCOSE 92  BUN 60*  CREATININE 1.36*  CALCIUM 8.3*  MG 1.5*  AST 20  ALT 14  ALKPHOS 52  BILITOT 0.6     Outpatient follow-up  Follow-up Information    [place], [facility]. Schedule an appointment as soon as possible for a visit in 2 days.   Contact information: [address] [place] [phone]               30 Day Unplanned Readmission Risk Score   Flowsheet Row ED to Hosp-Admission (Discharged) from 11/10/2020 in [facility] (1C)  30 Day Unplanned Readmission Risk Score (%) 18.14 Filed at  11/11/2020 1200     This score is the patient's risk of an unplanned readmission within 30 days of being discharged (0 -100%). The score is based on dignosis, age, lab data, medications, orders, and past utilization.   Low:  0-14.9   Medium: 15-21.9   High: 22-29.9   Extreme: 30 and above         Management plans discussed with the patient, family and they are in agreement.  CODE STATUS: Prior   TOTAL TIME TAKING CARE OF THIS PATIENT: 45 minutes.    Delfino Lovett M.D on 11/12/2020 at 11:29 PM  [facility]   CC: Primary care physician; [facility]   Note: This dictation was prepared with Dragon dictation along with smaller phrase technology. Any transcriptional errors that result from this process are unintentional.

## 2020-11-13 LAB — TACROLIMUS LEVEL: Tacrolimus (FK506) - LabCorp: 9.9 ng/mL (ref 2.0–20.0)

## 2020-11-13 NOTE — Unmapped (Signed)
COVID OUTREACH HISTORY   ??? Chart reviewed for COVID outreach due to notification that patient tested positive via rapid/ POCT test.   ??? External result was added to Epic to flag chart, enable population in lab fields and SmartLinks for clinical decision making:    Lab Results   Component Value Date    SARSCOVID Positive (A) 11/10/2020     ??? Patient was tested due to: symptomatic.  ??? Patient notified of positive COVID result by: ED on 11/10/20.  ??? Patient noted the following symptoms started on or around 11/07/20: non-productive cough, chills, loss of appetite/ poor intake, nausea, vomiting, diarrhea/ loose stools.  ??? Symptom day #: 5.    ??? Preferred phone: [phone]  ??? Preferred language: English  ??? [place] of residence: [facility]  ??? COVID immunization history:   #1: 01/11/2020  [employer]  #2: 10/02/2020  [employer]   #3:     Estimated body mass index is 23.52 kg/m?? as calculated from the following:    Height as of 10/02/20: 154.9 cm (5' 0.98).    Weight as of 10/02/20: 56.4 kg (124 lb 6.4 oz).    ELIGIBILTY   Must meet all four of the following:  [x]  Has at least one of the following symptoms AND started less than 10 days ago: fever, chills, cough, SOB, sore throat, nasal congestion, myalgias  []  Not hospitalized for COVID-19.  [x]  Not requiring supplemental oxygen (or, no increase from baseline flow rate for non-COVID condition).  [x]  High risk due to: Age 57 years or older  [place]'s prioritization strategy for high risk of COVID-19 progression:   Level 1    Patient meets one or more of the following:  ??? Age >/= 65 yrs  ??? BMI >/= 35  ??? BMI is >/= 30 AND has a diagnosis of DM, HTN, CVD, CAD, CHF  ??? Pregnant, or up to 6 weeks post partum  ??? Active treatment for cancer not in remission, chemotherapy  ??? Immunosuppressive disease (e.g., cancer [not in remission], solid organ transplant, s/p BMT in the last year, HIV with CD4 count < 200 cells/m3, AIDS without immune reconstitution)  ??? Immunosuppressive/modulating medications (e.g., chemotherapy, TNF blockers, steroid use >/= 20 mg/day or >/= 2 mg/kg/day prednisone or equivalent for >/= 14 days, chemotherapy, rituximab)  ??? Chronic lung disease (COPD, moderate-to-severe asthma, CF, ILD, PAH)  ??? Sickle Cell Disease  ??? Neurodevelopmental disorders (CP) or medical complexity (genetic or metabolic syndromes, severe congenital anomalies)  ??? CHD with significant hemodynamic compromise  ??? Medical technology dependence (trach, g-tube, positive pressure ventilation not related to COVID)      THERAPEUTICS PLAN   ??? Not eligible for COVID therapeutics due to: hospitalized for COVID-19.    OUTREACH PLAN   ??? Added to [employer].  ??? Outreach deferred today due to: currently admitted.  ??? Next evaluation for COVID outreach due: 11/14/20.  ??? Message routed to PCP.    Michaelle Copas, BS, RN-C, CMSRN

## 2020-11-13 NOTE — Unmapped (Signed)
Home Health Request for Verbal Order  ROUTE TO PROVIDER, NOT NURSING POOL    ??? Type of order requested (PT, OT, Speech, Home Health Nurse): PT  o Length of service: 8 weeks  o Frequency of service:2x a week  o Other instructions or special requests from caller:   ??? Return call to:   o Name: Gae Bon Phone number: [phone]  ??? PCP: Rockne Menghini, MD  ??? Last encounter in department: 10/02/2020  Saddie Benders  11/13/20

## 2020-11-15 DIAGNOSIS — U071 COVID-19: Principal | ICD-10-CM

## 2020-11-15 NOTE — Unmapped (Signed)
COVID OUTREACH   ??? Chart reviewed for COVID outreach on 11/15/20.    ??? Symptom day #: 8.    PLAN   ??? Outreach deferred today due to: currently admitted at OSH per Care Everywhere.  ??? No further outreach required: patient will be followed by Hospital Follow Up post discharge and will be more than day 10 at that time.    Michaelle Copas, BS, RN-C, CMSRN

## 2020-11-22 NOTE — Unmapped (Signed)
Order Information    ??? Name: Mrs. Leta Jungling with [facility]  ??? What needs to be informed:     Mrs. Tiffany wanted to inform PCP of patient declined OT appointment home order on 11/21/2020. Mrs. Tiffany would like a call back from PCP.     ??? Return call to:   o Name: Leta Jungling   o Company: [facility]  o Phone number: [phone] option 2  ??? PCP: Rockne Menghini, MD  ??? Last encounter in department: 10/02/2020

## 2020-12-05 NOTE — Unmapped (Addendum)
[facility] Refill Coordination Note    Specialty Medication(s) to be Shipped:   Transplant: Myfortic 180mg  and tacrolimus 0.5mg     Other medication(s) to be shipped: triam/hctz, atorvastatin, vit d, alendronate, losartan, amlodipine     Sophia Davis, DOB: September 11, 1946  Phone: [phone] (home)       All above HIPAA information was verified with patient.     Was a Nurse, learning disability used for this call? No    Completed refill call assessment today to schedule patient's medication shipment from the [facility] [phone]).       Specialty medication(s) and dose(s) confirmed: Regimen is correct and unchanged.   Changes to medications: Davy reports no changes at this time.  Changes to insurance: No  Questions for the pharmacist: No    Confirmed patient received Welcome Packet with first shipment. The patient will receive a drug information handout for each medication shipped and additional FDA Medication Guides as required.       DISEASE/MEDICATION-SPECIFIC INFORMATION        N/A    SPECIALTY MEDICATION ADHERENCE     Medication Adherence    Patient reported X missed doses in the last month: 0  Specialty Medication: Myfortic 180mg   Patient is on additional specialty medications: Yes  Additional Specialty Medications: Tacrolimus 0.5mg   Patient Reported Additional Medication X Missed Doses in the Last Month: 0  Patient is on more than two specialty medications: No  Adherence tools used: patient uses a pill box to manage medications                Myfortic 180 mg: 10 days of medicine on hand   Tacrolimus 0.5 mg: 10 days of medicine on hand        SHIPPING     Shipping address confirmed in Epic.     Delivery Scheduled: Yes, Expected medication delivery date: 12/12/20.     Medication will be delivered via Same Day Courier to the prescription address in Epic WAM.    [name]   [facility] Specialty Pharmacist

## 2020-12-09 DIAGNOSIS — Z940 Kidney transplant status: Principal | ICD-10-CM

## 2020-12-09 DIAGNOSIS — Z79899 Other long term (current) drug therapy: Principal | ICD-10-CM

## 2020-12-11 ENCOUNTER — Ambulatory Visit: Admit: 2020-12-11 | Discharge: 2020-12-12 | Payer: MEDICARE | Attending: Nephrology | Primary: Nephrology

## 2020-12-11 ENCOUNTER — Ambulatory Visit: Admit: 2020-12-11 | Discharge: 2020-12-12 | Payer: MEDICARE

## 2020-12-11 DIAGNOSIS — Z79899 Other long term (current) drug therapy: Principal | ICD-10-CM

## 2020-12-11 DIAGNOSIS — Z940 Kidney transplant status: Principal | ICD-10-CM

## 2020-12-11 LAB — PROTEIN / CREATININE RATIO, URINE
CREATININE, URINE: 78 mg/dL
PROTEIN URINE: <6 mg/dL

## 2020-12-11 LAB — RENAL FUNCTION PANEL
ALBUMIN: 3.8 g/dL (ref 3.4–5.0)
ANION GAP: 10 mmol/L (ref 5–14)
BLOOD UREA NITROGEN: 30 mg/dL — ABNORMAL HIGH (ref 9–23)
BUN / CREAT RATIO: 22
CALCIUM: 9.5 mg/dL (ref 8.7–10.4)
CHLORIDE: 101 mmol/L (ref 98–107)
CO2: 21.5 mmol/L (ref 20.0–31.0)
CREATININE: 1.37 mg/dL — ABNORMAL HIGH
EGFR CKD-EPI AA FEMALE: 44 mL/min/{1.73_m2} — ABNORMAL LOW (ref >=60)
EGFR CKD-EPI NON-AA FEMALE: 38 mL/min/{1.73_m2} — ABNORMAL LOW (ref >=60)
GLUCOSE RANDOM: 95 mg/dL (ref 70–99)
PHOSPHORUS: 3.9 mg/dL (ref 2.4–5.1)
POTASSIUM: 4 mmol/L (ref 3.4–4.5)
SODIUM: 132 mmol/L — ABNORMAL LOW (ref 135–145)

## 2020-12-11 LAB — URINALYSIS
BILIRUBIN UA: NEGATIVE
BLOOD UA: NEGATIVE
GLUCOSE UA: NEGATIVE
GRANULAR CASTS: 5 /LPF — ABNORMAL HIGH
KETONES UA: NEGATIVE
LEUKOCYTE ESTERASE UA: NEGATIVE
NITRITE UA: NEGATIVE
PH UA: 6 (ref 5.0–9.0)
PROTEIN UA: NEGATIVE
RBC UA: <1 /HPF (ref <4)
SPECIFIC GRAVITY UA: <=1.005 (ref 1.005–1.030)
SQUAMOUS EPITHELIAL: 1 /HPF (ref 0–5)
TRANSITIONAL EPITHELIAL: 5 /HPF — ABNORMAL HIGH (ref 0–2)
UROBILINOGEN UA: 0.2
WBC UA: 2 /HPF (ref 0–5)

## 2020-12-11 LAB — CBC W/ AUTO DIFF
BASOPHILS ABSOLUTE COUNT: 0 10*9/L (ref 0.0–0.1)
BASOPHILS RELATIVE PERCENT: 0.6 %
EOSINOPHILS ABSOLUTE COUNT: 0.1 10*9/L (ref 0.0–0.7)
EOSINOPHILS RELATIVE PERCENT: 1.3 %
HEMATOCRIT: 30 % — ABNORMAL LOW (ref 35.0–44.0)
HEMOGLOBIN: 10.1 g/dL — ABNORMAL LOW (ref 12.0–15.5)
LYMPHOCYTES ABSOLUTE COUNT: 1 10*9/L (ref 0.7–4.0)
LYMPHOCYTES RELATIVE PERCENT: 18.9 %
MEAN CORPUSCULAR HEMOGLOBIN CONC: 33.8 g/dL (ref 30.0–36.0)
MEAN CORPUSCULAR HEMOGLOBIN: 27.1 pg (ref 26.0–34.0)
MEAN CORPUSCULAR VOLUME: 80.2 fL — ABNORMAL LOW (ref 82.0–98.0)
MEAN PLATELET VOLUME: 8.7 fL (ref 7.0–10.0)
MONOCYTES ABSOLUTE COUNT: 0.6 10*9/L (ref 0.1–1.0)
MONOCYTES RELATIVE PERCENT: 10.7 %
NEUTROPHILS ABSOLUTE COUNT: 3.6 10*9/L (ref 1.7–7.7)
NEUTROPHILS RELATIVE PERCENT: 68.5 %
NUCLEATED RED BLOOD CELLS: 0 /100{WBCs} (ref <=4)
PLATELET COUNT: 259 10*9/L (ref 150–450)
RED BLOOD CELL COUNT: 3.74 10*12/L — ABNORMAL LOW (ref 3.90–5.03)
RED CELL DISTRIBUTION WIDTH: 14.8 % (ref 12.0–15.0)
WBC ADJUSTED: 5.3 10*9/L (ref 3.5–10.5)

## 2020-12-11 LAB — CMV DNA, QUANTITATIVE, PCR
CMV QUANT LOG10: 1.72 {Log_IU}/mL — ABNORMAL HIGH (ref <0.00)
CMV QUANT: 52 [IU]/mL — ABNORMAL HIGH (ref <0)

## 2020-12-11 LAB — MAGNESIUM: MAGNESIUM: 1.2 mg/dL — ABNORMAL LOW (ref 1.6–2.6)

## 2020-12-11 LAB — TACROLIMUS LEVEL, TROUGH: TACROLIMUS, TROUGH: 7.7 ng/mL (ref 5.0–15.0)

## 2020-12-11 NOTE — Unmapped (Unsigned)
Transplant Coordinator, Clinic Visit   Pt seen today by transplant nephrology for follow up, reviewed medications and symptoms.          12/11/20 1043   BP: 138/61   Pulse: 68   Temp: 35.9 ??C (96.7 ??F)   Weight: 53.7 kg (118 lb 4.8 oz)   Height: 154.9 cm (5' 1)   PainSc: 0-No pain       Assessment  BP: ***  Lightheaded: denies  BG: ***  Headache: denies  Hand tremors: denies  Numbness/tingling: denies  Fevers: denies  Chills/sweats: denies  Shortness of breath: denies  Chest pain or pressure: denies  Palpitations: denies  Abdominal pain: denies  Heart burn: denies  Nausea/vomiting: denies  Diarrhea/constipation: denies  UTI symptoms: denies  Swelling: denies    Good appetite, 1 meal per day + snacks; reports adequate hydration.     Any new medications? ***  Immunosuppressant last taken: ***    Immunization status: pt receive 1 J&J vaccine and 1 moderna vaccine     Functional Score: 90     Able to carry on normal activity;  Minor signs or symptoms of disease.    I spent a total of 10 minutes with [name] reviewing medications and symptoms.

## 2020-12-12 LAB — BK VIRUS QUANTITATIVE PCR, BLOOD: BK BLOOD RESULT: NOT DETECTED

## 2020-12-12 MED FILL — TRIAMTERENE 37.5 MG-HYDROCHLOROTHIAZIDE 25 MG CAPSULE: ORAL | 30 days supply | Qty: 30 | Fill #6

## 2020-12-12 MED FILL — TACROLIMUS 0.5 MG CAPSULE, IMMEDIATE-RELEASE: ORAL | 30 days supply | Qty: 150 | Fill #2

## 2020-12-12 MED FILL — CHOLECALCIFEROL (VITAMIN D3) 25 MCG (1,000 UNIT) TABLET: ORAL | 70 days supply | Qty: 70 | Fill #3

## 2020-12-12 MED FILL — LOSARTAN 50 MG TABLET: ORAL | 90 days supply | Qty: 180 | Fill #4

## 2020-12-12 MED FILL — ALENDRONATE 70 MG TABLET: ORAL | 84 days supply | Qty: 12 | Fill #2

## 2020-12-12 MED FILL — MYFORTIC 180 MG TABLET,DELAYED RELEASE: ORAL | 30 days supply | Qty: 120 | Fill #12

## 2020-12-12 MED FILL — AMLODIPINE 2.5 MG TABLET: ORAL | 90 days supply | Qty: 90 | Fill #3

## 2020-12-20 NOTE — Unmapped (Signed)
[facility] transplant nephrology clinic visit    assessment and plan  1. s/p kidney transplant 05/17/13. baseline creatinine 1-1.2 mg/dl. stage iiib chronic kidney disease. no proteinuria. no donor specific hla ab detected '21.   2. immunosuppression. mycophenolate sodium 360mg  bid. tacrolimus 12hr lvl 4-7 ng/ml.  3. hypertension. blood pressure goal < 130/80 mmhg.  4. health maintenance. influenza '21. pcv13 pneumococcal '14. ppsv23 pneumococcal '16 with 5 year booster recommended. zoster '19. covid-19 janssen/moderna vaccine '21. mammogram '12. gyn exam '13/post hysterectomy. cystoscopy '14. colonoscopy '20. dermatology '21. kidney ultrasound '22.    history of present illness    [name] is a 75 year old woman seen in follow up post kidney transplant 05/17/2013. sars cov2+ acute illness 01/21. [facility] admission 1/23-24; +acute kidney injury creatinine 2.4 that improved to 1.4 mg/dl with iv volume repletion.     past medical hx:   1. s/p deceased donor kidney transplant 05/17/2013. mpo-anca+ chronic gn. alemtuzumab induction. baseline creatinine 1-1.2 mg/dl.  2. mpo-anca+ vasculitis/granulomatosis with polyangiitis. hx pulmonary hemorrhage. trtmt plasma exchange, iv methylprednisolone, iv/po cyclophosphamide x total 28m '03-'07; rituximab 1gm x2 '07.  3. hypertension   4. coronary artery disease s/p pci/stent '97. nl cards stress '13: lvef > 65%. no myocardial ischemia   5. hyperlipidemia  6. osteoporosis  7. secondary/tertiary hyperparathyroidism    past surgical hx: total abdominal hysterectomy '98. 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 kidney transplant '14. right hand 3rd/4th trigger finger release '16.    allergies: nkda     medications: tacrolimus 1.5mg /1mg  am/pm, mycophenolate sodium 360mg  bid, losartan 50mg  bid, amlodipine 2.5mg  q.pm, triamterene/hydrochlorothiazide 37.5/25mg  daily, atorvastatin 40mg  daily, aspirin 81mg  daily, vitamin d 1,000u daily, famotidine 20mg  prn, alendronate 70mg  q.week.    soc hx: widowed x3 children. distant smoking hx; none > 40 yrs.    physical exam: t96.7 p68 bp138/61 wt53.7kg bmi22.3. wd/wn woman appropriate affect and mood. nl sclera anicteric. mmm no thrush. neck supple no palpable ln. heart rrr nl s1s2 no m/r/g. lungs clear bilateral. abdomen soft nt/nd. no lower ext edema. msk no synovitis or tophi. skin no rash. neuro alert oriented non focal exam.    labs 12/11/20: creatinine 1.4. tacrolimus 12hr lvl 7.7 ng/ml. urinalysis 6/1.005 no blood glucose or protein.

## 2021-01-01 ENCOUNTER — Ambulatory Visit: Admit: 2021-01-01 | Discharge: 2021-01-02 | Payer: MEDICARE

## 2021-01-01 DIAGNOSIS — I10 Essential (primary) hypertension: Principal | ICD-10-CM

## 2021-01-01 DIAGNOSIS — N1832 Stage 3b chronic kidney disease (CMS-HCC): Principal | ICD-10-CM

## 2021-01-01 DIAGNOSIS — F5101 Primary insomnia: Principal | ICD-10-CM

## 2021-01-01 DIAGNOSIS — Z940 Kidney transplant status: Principal | ICD-10-CM

## 2021-01-01 DIAGNOSIS — U071 COVID-19: Principal | ICD-10-CM

## 2021-01-01 MED ORDER — MYFORTIC 180 MG TABLET,DELAYED RELEASE
ORAL_TABLET | Freq: Two times a day (BID) | ORAL | PRN refills | 30 days
Start: 2021-01-01 — End: 2022-01-01

## 2021-01-01 MED ORDER — TRAZODONE 50 MG TABLET
ORAL_TABLET | Freq: Every evening | ORAL | 3 refills | 30.00000 days | Status: CP
Start: 2021-01-01 — End: 2021-01-31

## 2021-01-01 NOTE — Unmapped (Signed)
[facility] Refill Coordination Note    Specialty Medication(s) to be Shipped:   Transplant: Myfortic 180mg  and tacrolimus 0.5mg     Other medication(s) to be shipped: atorvastatin and triamterene/hctz     Sophia Davis, DOB: 31-Mar-1946  Phone: [phone] (home)       All above HIPAA information was verified with patient.     Was a Nurse, learning disability used for this call? No    Completed refill call assessment today to schedule patient's medication shipment from the [facility] [phone]).       Specialty medication(s) and dose(s) confirmed: Regimen is correct and unchanged.   Changes to medications: Ajeenah reports no changes at this time.  Changes to insurance: No  Questions for the pharmacist: No    Confirmed patient received Welcome Packet with first shipment. The patient will receive a drug information handout for each medication shipped and additional FDA Medication Guides as required.       DISEASE/MEDICATION-SPECIFIC INFORMATION        N/A    SPECIALTY MEDICATION ADHERENCE     Medication Adherence    Patient reported X missed doses in the last month: 0  Specialty Medication: Tacrolimus 0.5mg   Patient is on additional specialty medications: Yes  Additional Specialty Medications: Myfortic 180mg   Patient Reported Additional Medication X Missed Doses in the Last Month: 0  Patient is on more than two specialty medications: No  Adherence tools used: patient uses a pill box to manage medications        Tacrolimus 0.5 mg: 10 days of medicine on hand   Myfortic 180 mg: 10 days of medicine on hand     SHIPPING     Shipping address confirmed in Epic.     Delivery Scheduled: Yes, Expected medication delivery date: 01/10/2021.     Medication will be delivered via Next Day Courier to the prescription address in Epic WAM.    Sophia Davis   [facility] Specialty Technician

## 2021-01-01 NOTE — Unmapped (Signed)
Internal Medicine Clinic Visit    Reason for visit: Follow-up visit    A/P: Sophia Davis is a 75 y.o. F with PMH of HTN, immunosuppression, CKD, and COVID-19 here for follow up.     Sophia Davis was seen today for follow-up.    Diagnoses and all orders for this visit:    Essential hypertension (RAF-HCC)  Blood pressure at 123/50. Has been monitoring her BP and HR at home which has been around today's readings throughout March.   Immunosuppression (CMS-HCC)  She received Moderna full dose booster at last visit.   Stage 3b chronic kidney disease  Followed with Dr. Toni Arthurs (Nephrology). Her kidney disease remains stable.  She is to have labs a few weeks ago.  No need to get labs today.  COVID  - She was admitted to ED for AKI and gastroenteritis due to COVID-19 on 11/10/20. Her appetite hasn't fully recovered but she feels well overall.  Insomnia  - Wakes up in the middle of the night. Also having trouble falling asleep sometimes. This has been bothering her for a few months.  We discussed options such as sleep hygiene and meditation.  I gave her the names of some meditation apps but she is little uncertain whether they will be helpful.  She was interested in trying medications we decided to try trazodone.  Other orders  -    Trazodone 50 mg nightly       I personally spent 38 minutes face-to-face and non-face-to-face in the care of this patient, which includes all pre, intra, and post visit time on the date of service.    The documentation recorded by the scribe accurately reflects the service I personally performed and the decisions made by me.  Sophia Hoose, MD      _________________________________________________________    HPI:    75 y.o. female here for her follow-up visit.      She is overall feeling well.  She is able to do the things she wants to.     Her BP today (123/50) is great, and she has brought her journal of her BP readings throughout March today, which also look good.      She felt sick after her COVID booster (given 10/02/21) for a couple weeks. She was then admitted to the ED with AKI and gastroenteritis due to COVID-19 on 11/10/20. Says she recovered well after COVID, although her appetite hasn't fully returned to normal yet. She did do PT for two weeks after her ED admission (there was some confusion because of the note saying that she declined - this was only initially).        She has been having trouble with falling and staying asleep for the past few months. She says it takes her a long time to fall asleep due to thoughts running through her mind. When she wakes up to use the bathroom at night, she can't fall back asleep and says that she just gets on her tablet. We discussed sleep hygiene, meditation strategies, and sleep aids. Starting 50 mg trazodone nightly.          __________________________________________________________      Medications:  See below.    __________________________________________________________    Physical Exam:   Vital Signs:  Vitals:    01/01/21 0918   BP: 123/50   Pulse: 70   Resp: 16   Temp: 36.3 ??C (97.3 ??F)   TempSrc: Temporal   SpO2: 99%   Weight: 52.9 kg (116 lb 9.6 oz)  Height: 154.9 cm (5' 1)       Gen: Well appearing, NAD  CV: RRR  Pulm: CTA bilaterally, no wheezes or crackles  No edema       Your Medication List          Accurate as of January 01, 2021  9:59 AM. If you have any questions, ask your nurse or doctor.            CONTINUE taking these medications    alendronate 70 MG tablet  Commonly known as: FOSAMAX  Take 1 tablet (70 mg total) by mouth every seven (7) days.     amLODIPine 2.5 MG tablet  Commonly known as: NORVASC  Take 1 tablet (2.5 mg total) by mouth daily.     aspirin 81 MG chewable tablet  Chew 81 mg daily.     atorvastatin 40 MG tablet  Commonly known as: LIPITOR  Take 1 tablet by mouth every evening     cholecalciferol (vitamin D3 25 mcg (1,000 units)) 1,000 unit (25 mcg) tablet  Take 1 tablet (25 mcg total) by mouth daily.     famotidine 20 MG tablet  Commonly known as: PEPCID  TAKE 1 TABLET (20 MG TOTAL) BY MOUTH TWO (2) TIMES A DAY AS NEEDED FOR HEARTBURN.     losartan 50 MG tablet  Commonly known as: COZAAR  Take 1 tablet (50 mg total) by mouth two (2) times a day.     MYFORTIC 180 MG EC tablet  Generic drug: mycophenolate  Take 2 tablets (360 mg total) by mouth two (2) times a day.     tacrolimus 0.5 MG capsule  Commonly known as: PROGRAF  Take 3 capsules (1.5 mg total) by mouth in the morning AND 2 capsules (1 mg total) in the evening.     triamterene-hydrochlorothiazide 37.5-25 mg per capsule  Commonly known as: DYAZIDE  Take 1 capsule by mouth every morning.            [name], MS3

## 2021-01-01 NOTE — Unmapped (Signed)
[facility]     Type of visit:  face to face    Reason for visit: Follow up / Insomnia    General Consent to Treat (GCT) for non-epic video visits only: Verbal consent    Screening BP- 123/50    Allergies reviewed: Yes    Medication reviewed: Yes  Pended refills? No    HCDM reviewed and updated in Epic:    We are working to make sure all of our patients??? wishes are updated in Epic and part of that is documenting a Environmental health practitioner for each patient  A Health Care Decision Rodena Piety is someone you choose who can make health care decisions for you if you are not able ??? who would you most want to do this for you????  is already up to date.    HCDM (other legal document): Ellsworth Lennox - Son - [phone]    [facility] (patient stated preference): Lacey Jensen - Daughter - [phone]      BPAs completed:  PHQ2  AUDIT - Alcohol Screen  HARK - Interpersonal Violence      COVID-19 Vaccine Summary  Which COVID-19 Vaccine was administered  Moderna  Type:  Complete  Dates Given:  10/02/2020  Due Date for next dose:   10/30/2020    Date last COVID Positive Test:   11/10/2020      Date last mAB infusion:  07/17/2020        Immunization History   Administered Date(s) Administered   ??? COVID-19 VACC,(JANSSEN)(PF)(IM) 01/11/2020   ??? COVID-19 VACCINE,MRNA(MODERNA)(PF)(IM) 10/02/2020   ??? INFLUENZA TIV (TRI) PF (IM) 08/30/2006, 09/14/2007, 08/14/2009, 08/26/2011, 07/27/2012   ??? Influenza TRI (IIV3) 4+yrs PF 08/15/2013   ??? Influenza Vaccine Quad (IIV4 PF) 1mo+ injectable 07/04/2014, 07/23/2015, 10/06/2016, 07/27/2017, 06/28/2018, 07/12/2019, 10/02/2020   ??? PNEUMOCOCCAL POLYSACCHARIDE 23 06/22/2003, 08/12/2015   ??? Pneumococcal Conjugate 13-Valent 09/26/2013   ??? SHINGRIX-ZOSTER VACCINE (HZV), RECOMBINANT,SUB-UNIT,ADJUVANTED IM 01/12/2018, 06/17/2018       __________________________________________________________________________________________    SCREENINGS COMPLETED IN FLOWSHEETS    HARK Screening  HARK Screening  Within the last year, have you been humiliated or emotionally abused in other ways by your partner or ex-partner?: No  Within the last year, have you been afraid of your partner or ex-partner?: No  Within the last year, have you been raped or forced to have any kind of sexual activity by your partner or ex-partner?: No  Within the last year, have you been kicked, hit, slapped, or otherwise physically hurt by your partner or ex-partner?: No    AUDIT       PHQ2  PHQ-2 Total Score : 0    PHQ9          P4 Suicidality Screener                GAD7       COPD Assessment       Falls Risk

## 2021-01-01 NOTE — Unmapped (Addendum)
Patient Education     Try a meditation app on your phone or tablet.  Good ones are:  Insight Timer  Calm  Headspace       Try trazodone for sleep.    Insomnia: Care Instructions  Overview     Insomnia is the inability to sleep well. Insomnia may make it hard for you to get to sleep, stay asleep, or sleep as long as you need to. This can make you tired and grouchy during the day. It can also make you forgetful, less effective at work, and unhappy.  Insomnia can be linked to many things. These include health problems, medicines, and stressful events.  Treatment may include treating problems that may be linked with your insomnia. Treatment also includes behavior and lifestyle changes. This may include cognitive-behavioral therapy for insomnia (CBT-I). CBT-I uses different ways to help you change your thoughts and behaviors that may interfere with sleep. Your doctor can recommend specific things you can try. Examples include doing relaxation exercises, keeping regular bedtimes and wake times, limiting alcohol, and making healthy sleep habits. Some people decide to take medicine for a while to help with sleep.  Follow-up care is a key part of your treatment and safety. Be sure to make and go to all appointments, and call your doctor if you are having problems. It's also a good idea to know your test results and keep a list of the medicines you take.  How can you care for yourself at home?  Cognitive-behavioral therapy for insomnia (CBT-I)  ?? If your doctor recommends CBT-I, follow your treatment plan. Your doctor will give you instructions that are unique for you.  ?? Your plan will likely include a few things that you can try at home. For example:  ? Try meditation or other relaxation techniques before you go to bed.  ? Go to bed at the same time every night, and wake up at the same time every morning. Do not take naps during the day.  ? Do not stay in bed awake for too long. If you can't fall asleep, or if you wake up in the middle of the night and can't get back to sleep within about 15 to 20 minutes, get out of bed and go to another room until you feel sleepy.  ? If watching the clock makes you anxious, turn it facing away from you so you cannot see the time.  ? If you worry when you lie down, start a worry book. Well before bedtime, write down your worries, and then set the book and your concerns aside.  Healthy sleep habits  ?? If your doctor recommends it, try making healthy sleep habits. For example:  ? Keep your bedroom quiet, dark, and cool.  ? Do not have drinks with caffeine, such as coffee or black tea, for 8 hours before bed.  ? Do not smoke or use other types of tobacco near bedtime. Nicotine is a stimulant and can keep you awake.  ? Avoid drinking alcohol late in the evening, because it can cause you to wake in the middle of the night.  ? Do not eat a big meal close to bedtime. If you are hungry, eat a light snack.  ? Do not drink a lot of water close to bedtime, because the need to urinate may wake you up during the night.  ? Do not read, watch TV, or use your phone in bed. Use the bed only for sleeping and sex.  Medicine  ?? Be safe with medicines. Take your medicines exactly as prescribed. Call your doctor if you think you are having a problem with your medicine.  ?? Talk with your doctor before you try an over-the-counter medicine, herbal product, or supplement to try to improve your sleep. Your doctor can recommend how much to take and when to take it. Make sure your doctor knows all of the medicines, vitamins, herbal products, and supplements you take.  ?? You will get more details on the specific medicines your doctor prescribes.  When should you call for help?  Watch closely for changes in your health, and be sure to contact your doctor if:  ?? ?? Your efforts to improve your sleep do not work.   ?? ?? Your insomnia gets worse.   ?? ?? You have been feeling down, depressed, or hopeless or have lost interest in things that you usually enjoy.   Where can you learn more?  Go to [facility] at https://my[facility]chart.Armed forces logistics/support/administrative officer in the Menu. Enter P513 in the search box to learn more about Insomnia: Care Instructions.  Current as of: April 03, 2020??????????????????????????????Content Version: 13.2  ?? 2006-2022 [employer].   Care instructions adapted under license by [facility]. If you have questions about a medical condition or this instruction, always ask your healthcare professional. [employer] disclaims any warranty or liability for your use of this information.

## 2021-01-09 MED FILL — ATORVASTATIN 40 MG TABLET: ORAL | 90 days supply | Qty: 90 | Fill #1

## 2021-01-09 MED FILL — MYFORTIC 180 MG TABLET,DELAYED RELEASE: ORAL | 30 days supply | Qty: 120 | Fill #0

## 2021-01-09 MED FILL — TRIAMTERENE 37.5 MG-HYDROCHLOROTHIAZIDE 25 MG CAPSULE: ORAL | 30 days supply | Qty: 30 | Fill #7

## 2021-01-09 MED FILL — TACROLIMUS 0.5 MG CAPSULE, IMMEDIATE-RELEASE: ORAL | 30 days supply | Qty: 150 | Fill #3

## 2021-01-23 MED ORDER — TRAZODONE 50 MG TABLET
ORAL_TABLET | 2 refills | 0 days | Status: CP
Start: 2021-01-23 — End: ?

## 2021-02-06 MED ORDER — CHOLECALCIFEROL (VITAMIN D3) 25 MCG (1,000 UNIT) TABLET
ORAL_TABLET | Freq: Every day | ORAL | 3 refills | 90 days
Start: 2021-02-06 — End: 2022-02-06

## 2021-02-06 NOTE — Unmapped (Signed)
[facility] Clinical Assessment & Refill Coordination Note    Sophia Davis, DOB: [birth year]  Phone: [phone] (home)     All above HIPAA information was verified with patient.     Was a Nurse, learning disability used for this call? No    Specialty Medication(s):   Transplant: Myfortic 180mg  and tacrolimus 0.5mg      Current Outpatient Medications   Medication Sig Dispense Refill   ??? traZODone (DESYREL) 50 MG tablet TAKE 1 TABLET BY MOUTH EVERY DAY AT NIGHT 90 tablet 2   ??? alendronate (FOSAMAX) 70 MG tablet Take 1 tablet (70 mg total) by mouth every seven (7) days. 12 tablet 2   ??? amLODIPine (NORVASC) 2.5 MG tablet Take 1 tablet (2.5 mg total) by mouth daily. 90 tablet 3   ??? aspirin 81 MG chewable tablet Chew 81 mg daily.     ??? atorvastatin (LIPITOR) 40 MG tablet Take 1 tablet by mouth every evening 90 tablet 3   ??? cholecalciferol, vitamin D3 25 mcg, 1,000 units,, 1,000 unit (25 mcg) tablet Take 1 tablet (25 mcg total) by mouth daily. 90 tablet 3   ??? famotidine (PEPCID) 20 MG tablet TAKE 1 TABLET (20 MG TOTAL) BY MOUTH TWO (2) TIMES A DAY AS NEEDED FOR HEARTBURN. 180 tablet 3   ??? losartan (COZAAR) 50 MG tablet Take 1 tablet (50 mg total) by mouth two (2) times a day. 180 tablet PRN   ??? MYFORTIC 180 mg EC tablet Take 2 tablets (360 mg total) by mouth two (2) times a day. 120 tablet PRN   ??? tacrolimus (PROGRAF) 0.5 MG capsule Take 3 capsules (1.5 mg total) by mouth in the morning AND 2 capsules (1 mg total) in the evening. 150 capsule 11   ??? triamterene-hydrochlorothiazide (DYAZIDE) 37.5-25 mg per capsule Take 1 capsule by mouth every morning. 30 capsule 11     No current facility-administered medications for this visit.        Changes to medications: Doniqua reports no changes at this time.    No Known Allergies    Changes to allergies: No    SPECIALTY MEDICATION ADHERENCE     Myfortic 180 mg: 7 days of medicine on hand   Tacrolimus 0.5 mg: 7 days of medicine on hand       Medication Adherence    Patient reported X missed doses in the last month: 0  Specialty Medication: Myfortic 180mg   Patient is on additional specialty medications: Yes  Additional Specialty Medications: Tacrolimus 0.5mg   Patient Reported Additional Medication X Missed Doses in the Last Month: 0  Patient is on more than two specialty medications: No  Adherence tools used: patient uses a pill box to manage medications          Specialty medication(s) dose(s) confirmed: Regimen is correct and unchanged.     Are there any concerns with adherence? No    Adherence counseling provided? Not needed    CLINICAL MANAGEMENT AND INTERVENTION      Clinical Benefit Assessment:    Do you feel the medicine is effective or helping your condition? Yes    Clinical Benefit counseling provided? Not needed    Adverse Effects Assessment:    Are you experiencing any side effects? No    Are you experiencing difficulty administering your medicine? No    Quality of Life Assessment:    How many days over the past month did your kidney transplant  keep you from your normal activities? For  example, brushing your teeth or getting up in the morning. 0    Have you discussed this with your provider? Not needed    Acute Infection Status:    Acute infections noted within Epic:  No active infections  Patient reported infection: None    Therapy Appropriateness:    Is therapy appropriate? Yes, therapy is appropriate and should be continued    DISEASE/MEDICATION-SPECIFIC INFORMATION      N/A    PATIENT SPECIFIC NEEDS     - Does the patient have any physical, cognitive, or cultural barriers? No    - Is the patient high risk? Yes, patient is taking a REMS drug. Medication is dispensed in compliance with REMS program    - Does the patient require a Care Management Plan? No     - Does the patient require physician intervention or other additional services (i.e. nutrition, smoking cessation, social work)? No      SHIPPING     Specialty Medication(s) to be Shipped: Transplant: Myfortic 180mg  and tacrolimus 0.5mg     Other medication(s) to be shipped: Vit D, triamterene/hctz     Changes to insurance: No    Delivery Scheduled: Yes, Expected medication delivery date: 02/11/21.     Medication will be delivered via Same Day Courier to the confirmed prescription address in [facility].    The patient will receive a drug information handout for each medication shipped and additional FDA Medication Guides as required.  Verified that patient has previously received a Conservation officer, historic buildings and a Surveyor, mining.    All of the patient's questions and concerns have been addressed.    [name]   [facility] Specialty Pharmacist

## 2021-02-11 MED ORDER — CHOLECALCIFEROL (VITAMIN D3) 25 MCG (1,000 UNIT) TABLET
ORAL_TABLET | Freq: Every day | ORAL | 3 refills | 90 days | Status: CP
Start: 2021-02-11 — End: 2022-02-11
  Filled 2021-02-11: qty 100, 100d supply, fill #0

## 2021-02-11 MED FILL — MYFORTIC 180 MG TABLET,DELAYED RELEASE: ORAL | 30 days supply | Qty: 120 | Fill #1

## 2021-02-11 MED FILL — TACROLIMUS 0.5 MG CAPSULE, IMMEDIATE-RELEASE: ORAL | 30 days supply | Qty: 150 | Fill #4

## 2021-02-11 MED FILL — TRIAMTERENE 37.5 MG-HYDROCHLOROTHIAZIDE 25 MG CAPSULE: ORAL | 30 days supply | Qty: 30 | Fill #8

## 2021-02-27 MED ORDER — ALENDRONATE 70 MG TABLET
ORAL_TABLET | ORAL | 2 refills | 84 days | Status: CN
Start: 2021-02-27 — End: 2022-02-27

## 2021-03-06 NOTE — Unmapped (Signed)
Pt called with reports of urinary pain and pressure. PT sure she has a UTI. Pt asked to go to lab to leave sample, pt adamant that there is no way she can get to a lab to leave a sample.   Message sent to Dr. Toni Arthurs

## 2021-03-07 DIAGNOSIS — N390 Urinary tract infection, site not specified: Principal | ICD-10-CM

## 2021-03-07 MED ORDER — NITROFURANTOIN MONOHYDRATE/MACROCRYSTALS 100 MG CAPSULE
ORAL_CAPSULE | Freq: Two times a day (BID) | ORAL | 0 refills | 7 days | Status: CP
Start: 2021-03-07 — End: 2021-03-14

## 2021-03-07 NOTE — Unmapped (Signed)
Dr. Toni Arthurs orders Macrobid 100 mg PO BID for 7 days.     Spoke with pt, she requests it to be sent to [facility], this was done.

## 2021-03-12 MED ORDER — ALENDRONATE 70 MG TABLET
ORAL_TABLET | ORAL | 2 refills | 84 days | Status: CP
Start: 2021-03-12 — End: 2022-03-12
  Filled 2021-03-18: qty 12, 84d supply, fill #0

## 2021-03-12 MED ORDER — AMLODIPINE 2.5 MG TABLET
ORAL_TABLET | Freq: Every day | ORAL | 2 refills | 90 days | Status: CP
Start: 2021-03-12 — End: 2022-01-01
  Filled 2021-03-18: qty 90, 90d supply, fill #0

## 2021-03-12 MED ORDER — LOSARTAN 50 MG TABLET
ORAL_TABLET | Freq: Two times a day (BID) | ORAL | PRN refills | 90 days | Status: CP
Start: 2021-03-12 — End: 2022-01-01
  Filled 2021-03-18: qty 180, 90d supply, fill #0

## 2021-03-12 MED ORDER — FAMOTIDINE 20 MG TABLET
ORAL_TABLET | Freq: Two times a day (BID) | ORAL | 3 refills | 90 days | Status: CP | PRN
Start: 2021-03-12 — End: 2022-03-12

## 2021-03-12 NOTE — Unmapped (Signed)
Pt request for RX Refill FAMOTIDINE 20 MG TABLET

## 2021-03-12 NOTE — Unmapped (Signed)
[facility] Refill Coordination Note    Specialty Medication(s) to be Shipped:   Transplant: Myfortic 180mg  and tacrolimus 0.5mg     Other medication(s) to be shipped: alendronate, amlodipine, losartan, triamterene/hctz     Sophia Davis, DOB: 1946-05-05  Phone: [phone] (home)       All above HIPAA information was verified with patient.     Was a Nurse, learning disability used for this call? No    Completed refill call assessment today to schedule patient's medication shipment from the [facility] [phone]).  All relevant notes have been reviewed.     Specialty medication(s) and dose(s) confirmed: Regimen is correct and unchanged.   Changes to medications: Cianni reports no changes at this time.  Changes to insurance: No  New side effects reported not previously addressed with a pharmacist or physician: None reported  Questions for the pharmacist: No    Confirmed patient received a Conservation officer, historic buildings and a Surveyor, mining with first shipment. The patient will receive a drug information handout for each medication shipped and additional FDA Medication Guides as required.       DISEASE/MEDICATION-SPECIFIC INFORMATION        N/A    SPECIALTY MEDICATION ADHERENCE     Medication Adherence    Patient reported X missed doses in the last month: 0  Specialty Medication: tacrolimus (PROGRAF) 0.5 MG capsule  Patient is on additional specialty medications: Yes  Additional Specialty Medications: MYFORTIC 180 mg EC tablet  Patient Reported Additional Medication X Missed Doses in the Last Month: 0  Patient is on more than two specialty medications: No  Adherence tools used: patient uses a pill box to manage medications              Were doses missed due to medication being on hold? No    Myfortic 180 mg: 7 days of medicine on hand   Tacrolimus 0.5 mg: 7 days of medicine on hand     REFERRAL TO PHARMACIST     Referral to the pharmacist: Not needed      [facility]     Shipping address confirmed in Epic.     Delivery Scheduled: Yes, Expected medication delivery date: 03/18/21.     Medication will be delivered via Same Day Courier to the prescription address in Epic WAM.    [name]   [facility] Specialty Pharmacist

## 2021-03-12 NOTE — Unmapped (Signed)
Patient has requested a medication refill via EPIC

## 2021-03-14 DIAGNOSIS — Z79899 Other long term (current) drug therapy: Principal | ICD-10-CM

## 2021-03-14 DIAGNOSIS — Z940 Kidney transplant status: Principal | ICD-10-CM

## 2021-03-18 MED FILL — TRIAMTERENE 37.5 MG-HYDROCHLOROTHIAZIDE 25 MG CAPSULE: ORAL | 30 days supply | Qty: 30 | Fill #9

## 2021-03-18 MED FILL — MYFORTIC 180 MG TABLET,DELAYED RELEASE: ORAL | 30 days supply | Qty: 120 | Fill #2

## 2021-03-18 MED FILL — TACROLIMUS 0.5 MG CAPSULE, IMMEDIATE-RELEASE: ORAL | 30 days supply | Qty: 150 | Fill #5

## 2021-03-21 ENCOUNTER — Ambulatory Visit: Admit: 2021-03-21 | Discharge: 2021-03-22 | Payer: MEDICARE | Attending: Nephrology | Primary: Nephrology

## 2021-03-21 ENCOUNTER — Ambulatory Visit: Admit: 2021-03-21 | Discharge: 2021-03-22 | Payer: MEDICARE

## 2021-03-21 LAB — CBC W/ AUTO DIFF
BASOPHILS ABSOLUTE COUNT: 0 10*9/L (ref 0.0–0.1)
BASOPHILS RELATIVE PERCENT: 0.9 %
EOSINOPHILS ABSOLUTE COUNT: 0.1 10*9/L (ref 0.0–0.5)
EOSINOPHILS RELATIVE PERCENT: 2.2 %
HEMATOCRIT: 31.1 % — ABNORMAL LOW (ref 34.0–44.0)
HEMOGLOBIN: 10.4 g/dL — ABNORMAL LOW (ref 11.3–14.9)
LYMPHOCYTES ABSOLUTE COUNT: 0.7 10*9/L — ABNORMAL LOW (ref 1.1–3.6)
LYMPHOCYTES RELATIVE PERCENT: 18.4 %
MEAN CORPUSCULAR HEMOGLOBIN CONC: 33.5 g/dL (ref 32.0–36.0)
MEAN CORPUSCULAR HEMOGLOBIN: 27.3 pg (ref 25.9–32.4)
MEAN CORPUSCULAR VOLUME: 81.6 fL (ref 77.6–95.7)
MEAN PLATELET VOLUME: 8.1 fL (ref 6.8–10.7)
MONOCYTES ABSOLUTE COUNT: 0.5 10*9/L (ref 0.3–0.8)
MONOCYTES RELATIVE PERCENT: 12.3 %
NEUTROPHILS ABSOLUTE COUNT: 2.5 10*9/L (ref 1.8–7.8)
NEUTROPHILS RELATIVE PERCENT: 66.2 %
NUCLEATED RED BLOOD CELLS: 0 /100{WBCs} (ref <=4)
PLATELET COUNT: 214 10*9/L (ref 150–450)
RED BLOOD CELL COUNT: 3.82 10*12/L — ABNORMAL LOW (ref 3.95–5.13)
RED CELL DISTRIBUTION WIDTH: 14.2 % (ref 12.2–15.2)
WBC ADJUSTED: 3.8 10*9/L (ref 3.6–11.2)

## 2021-03-21 LAB — URINALYSIS
BILIRUBIN UA: NEGATIVE
BLOOD UA: NEGATIVE
GLUCOSE UA: NEGATIVE
KETONES UA: NEGATIVE
NITRITE UA: NEGATIVE
PH UA: 6 (ref 5.0–9.0)
PROTEIN UA: NEGATIVE
RBC UA: <1 /HPF (ref <4)
SPECIFIC GRAVITY UA: 1.025 (ref 1.005–1.030)
SQUAMOUS EPITHELIAL: 6 /HPF — ABNORMAL HIGH (ref 0–5)
TRANSITIONAL EPITHELIAL: 1 /HPF (ref 0–2)
UROBILINOGEN UA: 0.2
WBC UA: 2 /HPF (ref 0–5)

## 2021-03-21 LAB — PROTEIN / CREATININE RATIO, URINE
CREATININE, URINE: 147.5 mg/dL
PROTEIN URINE: 17 mg/dL
PROTEIN/CREAT RATIO, URINE: 0.115

## 2021-03-21 LAB — RENAL FUNCTION PANEL
ALBUMIN: 4.1 g/dL (ref 3.4–5.0)
ANION GAP: 8 mmol/L (ref 5–14)
BLOOD UREA NITROGEN: 20 mg/dL (ref 9–23)
BUN / CREAT RATIO: 15
CALCIUM: 9.8 mg/dL (ref 8.7–10.4)
CHLORIDE: 102 mmol/L (ref 98–107)
CO2: 24.2 mmol/L (ref 20.0–31.0)
CREATININE: 1.34 mg/dL — ABNORMAL HIGH
EGFR CKD-EPI (2021) FEMALE: 41 mL/min/{1.73_m2} — ABNORMAL LOW (ref >=60)
GLUCOSE RANDOM: 99 mg/dL (ref 70–99)
PHOSPHORUS: 4 mg/dL (ref 2.4–5.1)
POTASSIUM: 3.3 mmol/L — ABNORMAL LOW (ref 3.4–4.8)
SODIUM: 134 mmol/L — ABNORMAL LOW (ref 135–145)

## 2021-03-21 LAB — HEMOGLOBIN A1C
ESTIMATED AVERAGE GLUCOSE: 97 mg/dL
HEMOGLOBIN A1C: 5 % (ref 4.8–5.6)

## 2021-03-21 LAB — COVID SPIKE IGG
SARS-COV-2 IGG SEMI-QUANT: 321 [AU]/ml — ABNORMAL HIGH (ref <13.00)
SARS-COV-2 SPIKE IGG ANTIBODY: POSITIVE — AB

## 2021-03-21 LAB — MAGNESIUM: MAGNESIUM: 1.5 mg/dL — ABNORMAL LOW (ref 1.6–2.6)

## 2021-03-21 NOTE — Unmapped (Signed)
Transplant Coordinator, Clinic Visit   Pt seen today by transplant nephrology for follow up, reviewed medications and symptoms.   Met with pt and daughter in law during clinic visit today. PT doing well, she is feeling much better after finishing her antibiotics for UTI.  Pt did get labs prior to coming up to clinic       Assessment  BP: 149/55 today in clinic. Pt reports her BP at home is normally much better than this. Pt just took her BP meds while in clinic this morning.  Lightheaded: denies  Headache: denies  Hand tremors: denies  Numbness/tingling: denies  Fevers: denies  Chills/sweats: denies  Shortness of breath: denies  Chest pain or pressure: denies  Palpitations: denies  Abdominal pain: denies  Heart burn: some, pt takes pepcid when she has heartburn  Nausea/vomiting: denies  Diarrhea/constipation: denies  UTI symptoms: denies    Good appetite; reports adequate hydration.     Any new medications? denies  Immunosuppressant last taken: 2100 on 03/20/21    Immunization status: covid x 2, pt refuses to get anymore vaccines at this time.     Functional Score: 100   Normal no complaints; no evidence of  disease.   Employment status is: does not work    I spent a total of 10 minutes with Sophia Davis reviewing medications and symptoms.

## 2021-03-21 NOTE — Unmapped (Signed)
AOBP:  Left       arm            Medium   cuff     Average : 156/56               Pulse: 57    1st reading:  157/58          Pulse: 58    2nd reading: 152/56           Pulse: 57    3rd reading:  160/54           Pulse: 60

## 2021-03-22 LAB — TACROLIMUS LEVEL, TROUGH: TACROLIMUS, TROUGH: 6 ng/mL (ref 5.0–15.0)

## 2021-03-23 LAB — CMV DNA, QUANTITATIVE, PCR
CMV QUANT LOG10: 3.01 {Log_IU}/mL — ABNORMAL HIGH (ref <0.00)
CMV QUANT: 1018 [IU]/mL — ABNORMAL HIGH (ref <0)
CMV VIRAL LD: DETECTED — AB

## 2021-03-28 LAB — FSAB CLASS 1 ANTIBODY SPECIFICITY: HLA CLASS 1 ANTIBODY RESULT: POSITIVE

## 2021-03-28 LAB — HLA DS POST TRANSPLANT
ANTI-DONOR DRW #1 MFI: 750 MFI
ANTI-DONOR DRW #2 MFI: 41 MFI
ANTI-DONOR HLA-A #1 MFI: 0 MFI
ANTI-DONOR HLA-B #1 MFI: 0 MFI
ANTI-DONOR HLA-B #2 MFI: 45 MFI
ANTI-DONOR HLA-C #1 MFI: 0 MFI
ANTI-DONOR HLA-C #2 MFI: 0 MFI
ANTI-DONOR HLA-DQB #1 MFI: 0 MFI
ANTI-DONOR HLA-DQB #2 MFI: 0 MFI
ANTI-DONOR HLA-DR #1 MFI: 0 MFI
ANTI-DONOR HLA-DR #2 MFI: 0 MFI

## 2021-03-28 LAB — FSAB CLASS 2 ANTIBODY SPECIFICITY
CLASS 2 ANTIBODIES IDENTIFIED: 1:1 {titer}
HLA CL2 AB RESULT: POSITIVE

## 2021-03-30 NOTE — Unmapped (Signed)
[facility] transplant nephrology clinic visit    assessment and plan  1. s/p kidney transplant 05/17/13. baseline creatinine 1-1.2 mg/dl. stage iiib chronic kidney disease. no proteinuria. no donor specific hla ab detected '22.   2. immunosuppression. mycophenolate sodium 360mg  bid. tacrolimus 12hr lvl 4-7 ng/ml.  3. hypertension. blood pressure goal < 130/80 mmhg.  4. cmv viremia. asymptomatic. repeat cmv pcr in 2 weeks with valganciclovir 450mg  bid if symptoms or increasing viral level.  5. health maintenance. influenza '21. pcv13 pneumococcal '20 administered. zoster '19. covid-19 janssen/moderna vaccine '21. mammogram '21. gyn exam '13/post hysterectomy. cystoscopy '14. colonoscopy '20. dermatology '21. kidney ultrasound '22.    history of present illness    [name] is a 75 year old woman seen in follow up post kidney transplant 05/17/2013. she feels well. +urine tract infection symptoms/dysuria last week resolved with nitrofurantoin x7d. no fever chills or sweats. no headache or lightheaded. no chest pain palpitations cough or shortness of breath. no lower extremity edema. appetite nl. no abdominal pain no n/v/d. no myalgias or arthralgias. no dysuria hematuria or difficulty voiding. blood pressure 120-130s/60s. all other systems reviewed and negative x10 systems.    past medical hx:   1. s/p deceased donor kidney transplant 05/17/2013. mpo-anca+ chronic gn. alemtuzumab induction. baseline creatinine 1-1.2 mg/dl.  2. mpo-anca+ vasculitis/granulomatosis with polyangiitis. hx pulmonary hemorrhage. trtmt plasma exchange, iv methylprednisolone, iv/po cyclophosphamide x total 65m '03-'07; rituximab 1gm x2 '07.  3. hypertension   4. coronary artery disease s/p pci/stent '97. nl cards stress '13: lvef > 65%. no myocardial ischemia   5. hyperlipidemia  6. osteoporosis  7. secondary/tertiary hyperparathyroidism    past surgical hx: total abdominal hysterectomy '98. 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 kidney transplant '14. right hand 3rd/4th trigger finger release '16.    allergies: nkda     medications: tacrolimus 1.5mg /1mg  am/pm, mycophenolate sodium 360mg  bid, losartan 50mg  bid, amlodipine 2.5mg  q.pm, triamterene/hydrochlorothiazide 37.5mg /25mg  daily, atorvastatin 40mg  daily, aspirin 81mg  daily, vitamin d 1,000u daily, famotidine 20mg  prn, alendronate 70mg  q.week.    soc hx: widowed x3 children. distant smoking hx; none > 40 yrs.    physical exam: t96.1 p57 bp149/55 wt51.3kg bmi21.3. wd/wn woman appropriate affect and mood. nl sclera anicteric. mmm no thrush. neck supple no palpable ln. heart rrr nl s1s2 no m/r/g. lungs clear bilateral. abdomen soft nt/nd. no lower ext edema. msk no synovitis or tophi. skin no rash. neuro alert oriented non focal exam.    labs 03/21/21: creatinine 1.3. tacrolimus 12hr lvl 6.0 ng/ml. cmv pcr +1,018. urinalysis 6/1.025 no blood glucose or protein.

## 2021-03-31 NOTE — Unmapped (Signed)
Order for CMV level faxed to [facility]. Pt was called, no answer, VM left for pt to go get labs this week to recheck CMV level. Pt to call back for any questions

## 2021-04-08 ENCOUNTER — Ambulatory Visit: Admit: 2021-04-08 | Discharge: 2021-04-08 | Payer: MEDICARE

## 2021-04-09 DIAGNOSIS — Z1231 Encounter for screening mammogram for malignant neoplasm of breast: Principal | ICD-10-CM

## 2021-04-09 NOTE — Unmapped (Signed)
[facility] Refill Coordination Note    Specialty Medication(s) to be Shipped:   Transplant: Myfortic 180mg  and tacrolimus 0.5mg     Other medication(s) to be shipped: Dyazide, atorvastatin     Sophia Davis, DOB: 23-Nov-1945  Phone: [phone] (home)       All above HIPAA information was verified with patient.     Was a Nurse, learning disability used for this call? No    Completed refill call assessment today to schedule patient's medication shipment from the [facility] [phone]).  All relevant notes have been reviewed.     Specialty medication(s) and dose(s) confirmed: Regimen is correct and unchanged.   Changes to medications: [name] reports no changes at this time.  Changes to insurance: No  New side effects reported not previously addressed with a pharmacist or physician: None reported  Questions for the pharmacist: No    Confirmed patient received a Conservation officer, historic buildings and a Surveyor, mining with first shipment. The patient will receive a drug information handout for each medication shipped and additional FDA Medication Guides as required.       DISEASE/MEDICATION-SPECIFIC INFORMATION        N/A    SPECIALTY MEDICATION ADHERENCE     Medication Adherence    Patient reported X missed doses in the last month: 0  Specialty Medication: tacrolimus (PROGRAF) 0.5 MG capsule  Patient is on additional specialty medications: Yes  Additional Specialty Medications: MYFORTIC 180 mg EC tablet  Patient Reported Additional Medication X Missed Doses in the Last Month: 0  Patient is on more than two specialty medications: No  Adherence tools used: patient uses a pill box to manage medications              Were doses missed due to medication being on hold? No    Myfortic 180mg   8 days worth of medication on hand.    tacrolimus 0.5mg   8 days worth of medication on hand.    REFERRAL TO PHARMACIST     Referral to the pharmacist: Not needed      [facility]     Shipping address confirmed in Epic. Delivery Scheduled: Yes, Expected medication delivery date: 04/15/21.     Medication will be delivered via UPS to the prescription address in Epic WAM.    Sophia Davis   [facility] Specialty Technician

## 2021-04-14 MED FILL — TRIAMTERENE 37.5 MG-HYDROCHLOROTHIAZIDE 25 MG CAPSULE: ORAL | 30 days supply | Qty: 30 | Fill #10

## 2021-04-14 MED FILL — MYFORTIC 180 MG TABLET,DELAYED RELEASE: ORAL | 30 days supply | Qty: 120 | Fill #3

## 2021-04-14 MED FILL — TACROLIMUS 0.5 MG CAPSULE, IMMEDIATE-RELEASE: ORAL | 30 days supply | Qty: 150 | Fill #6

## 2021-04-14 MED FILL — ATORVASTATIN 40 MG TABLET: ORAL | 90 days supply | Qty: 90 | Fill #2

## 2021-04-15 NOTE — Unmapped (Signed)
Spoke with pt on phone, she reports she never got message to repeat labs. Pt was asked to get labs this week to recheck her CMV level, pt reports that she will go on Thursday or Friday this week. Order has been sent to [facility] lab.    Pt also reports that her BP's have been good:132/57, 114/55, 118/58, 126/66    Pt with no other needs or concerns at this time.

## 2021-04-16 ENCOUNTER — Other Ambulatory Visit
Admission: RE | Admit: 2021-04-16 | Discharge: 2021-04-16 | Disposition: A | Discharge status: Home / Self Care | Payer: Medicare Other | Source: Ambulatory Visit | Attending: Nephrology | Admitting: Nephrology

## 2021-04-16 DIAGNOSIS — Z940 Kidney transplant status: Secondary | ICD-10-CM | POA: Insufficient documentation

## 2021-04-16 DIAGNOSIS — E1129 Type 2 diabetes mellitus with other diabetic kidney complication: Secondary | ICD-10-CM | POA: Diagnosis not present

## 2021-04-16 DIAGNOSIS — D890 Polyclonal hypergammaglobulinemia: Secondary | ICD-10-CM | POA: Insufficient documentation

## 2021-04-16 DIAGNOSIS — D631 Anemia in chronic kidney disease: Secondary | ICD-10-CM | POA: Insufficient documentation

## 2021-04-16 DIAGNOSIS — N390 Urinary tract infection, site not specified: Secondary | ICD-10-CM | POA: Diagnosis not present

## 2021-04-16 DIAGNOSIS — E559 Vitamin D deficiency, unspecified: Secondary | ICD-10-CM | POA: Diagnosis not present

## 2021-04-16 DIAGNOSIS — Z789 Other specified health status: Secondary | ICD-10-CM | POA: Insufficient documentation

## 2021-04-16 DIAGNOSIS — Z9483 Pancreas transplant status: Secondary | ICD-10-CM | POA: Diagnosis not present

## 2021-04-16 DIAGNOSIS — Z114 Encounter for screening for human immunodeficiency virus [HIV]: Secondary | ICD-10-CM | POA: Diagnosis not present

## 2021-04-16 DIAGNOSIS — Z09 Encounter for follow-up examination after completed treatment for conditions other than malignant neoplasm: Secondary | ICD-10-CM | POA: Diagnosis present

## 2021-04-16 DIAGNOSIS — Z79899 Other long term (current) drug therapy: Secondary | ICD-10-CM | POA: Diagnosis not present

## 2021-04-16 DIAGNOSIS — B259 Cytomegaloviral disease, unspecified: Secondary | ICD-10-CM | POA: Insufficient documentation

## 2021-04-16 LAB — CBC WITH DIFFERENTIAL/PLATELET
Abs Immature Granulocytes: 0.02 10*3/uL (ref 0.00–0.07)
Basophils Absolute: 0 10*3/uL (ref 0.0–0.1)
Basophils Relative: 1 %
Eosinophils Absolute: 0.1 10*3/uL (ref 0.0–0.5)
Eosinophils Relative: 2 %
HCT: 32.3 % — ABNORMAL LOW (ref 36.0–46.0)
Hemoglobin: 10.4 g/dL — ABNORMAL LOW (ref 12.0–15.0)
Immature Granulocytes: 1 %
Lymphocytes Relative: 18 %
Lymphs Abs: 0.8 10*3/uL (ref 0.7–4.0)
MCH: 26.7 pg (ref 26.0–34.0)
MCHC: 32.2 g/dL (ref 30.0–36.0)
MCV: 82.8 fL (ref 80.0–100.0)
Monocytes Absolute: 0.6 10*3/uL (ref 0.1–1.0)
Monocytes Relative: 14 %
Neutro Abs: 2.7 10*3/uL (ref 1.7–7.7)
Neutrophils Relative %: 64 %
Platelets: 250 10*3/uL (ref 150–400)
RBC: 3.9 MIL/uL (ref 3.87–5.11)
RDW: 13.2 % (ref 11.5–15.5)
WBC: 4.2 10*3/uL (ref 4.0–10.5)
nRBC: 0 % (ref 0.0–0.2)

## 2021-04-16 LAB — BASIC METABOLIC PANEL
Anion gap: 10 (ref 5–15)
BUN: 19 mg/dL (ref 8–23)
CO2: 23 mmol/L (ref 22–32)
Calcium: 9.5 mg/dL (ref 8.9–10.3)
Chloride: 99 mmol/L (ref 98–111)
Creatinine, Ser: 1.35 mg/dL — ABNORMAL HIGH (ref 0.44–1.00)
GFR, Estimated: 41 mL/min — ABNORMAL LOW (ref >60)
Glucose, Bld: 114 mg/dL — ABNORMAL HIGH (ref 70–99)
Potassium: 4.8 mmol/L (ref 3.5–5.1)
Sodium: 132 mmol/L — ABNORMAL LOW (ref 135–145)

## 2021-04-16 LAB — MAGNESIUM: Magnesium: 1.6 mg/dL — ABNORMAL LOW (ref 1.7–2.4)

## 2021-04-16 LAB — PHOSPHORUS: Phosphorus: 3.5 mg/dL (ref 2.5–4.6)

## 2021-04-18 LAB — CMV DNA, QUANTITATIVE, PCR
CMV DNA Quant: 381 IU/mL
Log10 CMV Qn DNA Pl: 2.581 log10 IU/mL

## 2021-04-18 LAB — TACROLIMUS LEVEL: Tacrolimus (FK506) - LabCorp: 5 ng/mL (ref 2.0–20.0)

## 2021-04-24 NOTE — Unmapped (Signed)
Spoke with Mette, she reports that she got labs drawn on 6.29. No results at this time.  Called [place] lab and spoke with Tobi Bastos, she reports that labs are resulted and she will fax them now, fax number given.

## 2021-04-29 LAB — BASIC METABOLIC PANEL
ANION GAP: 10 (ref 5–15)
BLOOD UREA NITROGEN: 19 mg/dL (ref 8–23)
CALCIUM: 9.5 (ref 8.9–10.3)
CHLORIDE: 99 mmol/l
CO2: 23 (ref 22–32)
CREATININE: 1.35 — ABNORMAL HIGH (ref 0.44–1.00)
EGFR CKD-EPI NON-AA MALE: 41 — ABNORMAL LOW
GLUCOSE RANDOM: 114 mg/dL — ABNORMAL HIGH
MAGNESIUM: 1.6 mg/dL — ABNORMAL LOW (ref 1.7–2.4)
PHOSPHORUS: 3.5 (ref 2.5–4.6)
POTASSIUM: 4.8 mmol/L (ref 3.5–5.1)
SODIUM: 132 mmol/L — ABNORMAL LOW (ref 135–1451)

## 2021-04-29 LAB — CBC W/ DIFFERENTIAL
BASOPHILS ABSOLUTE COUNT: 0 (ref 0.0–0.1)
BASOPHILS RELATIVE PERCENT: 1 %
EOSINOPHILS ABSOLUTE COUNT: 0.1
EOSINOPHILS RELATIVE PERCENT: 2 %
HEMATOCRIT: 32.3 — ABNORMAL LOW (ref 36.0–46.01)
HEMOGLOBIN: 10.4 — ABNORMAL LOW (ref 12.0–15.0)
LYMPHOCYTES ABSOLUTE COUNT: 0.8 10*3/uL (ref 0.7–4.0)
LYMPHOCYTES RELATIVE PERCENT: 18 %
MEAN CORPUSCULAR HEMOGLOBIN CONC: 32.2 g/dL (ref 30.0–36.0)
MEAN CORPUSCULAR HEMOGLOBIN: 26.7 pg (ref 26.0–34.0)
MEAN CORPUSCULAR VOLUME: 82.8 (ref 80.0–100.0)
MONOCYTES ABSOLUTE COUNT: 0.6 10*3/uL (ref 0.1–1.0)
MONOCYTES RELATIVE PERCENT: 14 %
NEUTROPHILS RELATIVE PERCENT: 64 %
NUCLEATED RED BLOOD CELLS: 0 (ref 0.0–0.2)
PLATELET COUNT: 250 (ref 150–4001)
RED BLOOD CELL COUNT: 3.9 uL (ref 3.87–5.1)
RED CELL DISTRIBUTION WIDTH: 13.2 % (ref 5–15.5)
WHITE BLOOD CELL COUNT: 4.2 10*3/uL (ref 4.0–10.5)

## 2021-04-29 LAB — CMV DNA, QUANTITATIVE, PCR
CMV COMMENT: 381
CMV QUANT LOG10: 2.581

## 2021-04-29 LAB — TACROLIMUS LEVEL: TACROLIMUS BLOOD: 5 ng/mL (ref 2.0–20.0)

## 2021-04-30 NOTE — Unmapped (Signed)
Called pt and reviewed her most recent labs with her.  Pt with no other needs at this time.

## 2021-05-05 NOTE — Unmapped (Signed)
[facility] Refill Coordination Note    Specialty Medication(s) to be Shipped:   Transplant: Myfortic 180mg  and tacrolimus 0.5mg     Other medication(s) to be shipped: triamterene/hctz 37.5-25mg      Sophia Davis, DOB: 1946-03-11  Phone: [phone] (home)       All above HIPAA information was verified with patient.     Was a Nurse, learning disability used for this call? No    Completed refill call assessment today to schedule patient's medication shipment from the [facility] [phone]).  All relevant notes have been reviewed.     Specialty medication(s) and dose(s) confirmed: Regimen is correct and unchanged.   Changes to medications: Sophia Davis reports no changes at this time.  Changes to insurance: No  New side effects reported not previously addressed with a pharmacist or physician: None reported  Questions for the pharmacist: No    Confirmed patient received a Conservation officer, historic buildings and a Surveyor, mining with first shipment. The patient will receive a drug information handout for each medication shipped and additional FDA Medication Guides as required.       DISEASE/MEDICATION-SPECIFIC INFORMATION        N/A    SPECIALTY MEDICATION ADHERENCE     Medication Adherence    Patient reported X missed doses in the last month: 0  Specialty Medication: Myfortic 180mg   Patient is on additional specialty medications: Yes  Additional Specialty Medications: Tacrolimus 0.5mg   Patient Reported Additional Medication X Missed Doses in the Last Month: 0  Patient is on more than two specialty medications: No  Adherence tools used: patient uses a pill box to manage medications              Were doses missed due to medication being on hold? No    Myfortic 180 mg: 10 days of medicine on hand   tacrolimus 0.5 mg: 10 days of medicine on hand         REFERRAL TO PHARMACIST     Referral to the pharmacist: Not needed      [facility]     Shipping address confirmed in Epic.     Delivery Scheduled: Yes, Expected medication delivery date: 05/13/21.     Medication will be delivered via Next Day Courier to the prescription address in Epic WAM.    Unk Lightning   [facility] Specialty Technician

## 2021-05-12 MED FILL — TRIAMTERENE 37.5 MG-HYDROCHLOROTHIAZIDE 25 MG CAPSULE: ORAL | 30 days supply | Qty: 30 | Fill #11

## 2021-05-12 MED FILL — MYFORTIC 180 MG TABLET,DELAYED RELEASE: ORAL | 30 days supply | Qty: 120 | Fill #4

## 2021-05-12 MED FILL — TACROLIMUS 0.5 MG CAPSULE, IMMEDIATE-RELEASE: ORAL | 30 days supply | Qty: 150 | Fill #7

## 2021-05-28 NOTE — Unmapped (Signed)
Spoke with pt on phone to ask her to get repeat labs including CMV. Pt is in [place] until the end of the month.   Spoke with pt's granddaughter and she reports that there is a local hospital called [facility] that pt can get her labs drawn at.  Called and spoke with lab tech and fax number is [phone]. Lab orders faxed, pt will get labs tomorrow.

## 2021-05-28 NOTE — Unmapped (Signed)
[employer] forms

## 2021-06-03 NOTE — Unmapped (Signed)
[facility] Refill Coordination Note    Specialty Medication(s) to be Shipped:   Transplant: Myfortic 180mg  and tacrolimus 0.5mg     Other medication(s) to be shipped: triamterene, losartan, vitamin D, amlodipine and alendronate     Sophia Davis, DOB: Apr 19, 1946  Phone: [phone] (home)       All above HIPAA information was verified with patient.     Was a Nurse, learning disability used for this call? No    Completed refill call assessment today to schedule patient's medication shipment from the [facility] [phone]).  All relevant notes have been reviewed.     Specialty medication(s) and dose(s) confirmed: Regimen is correct and unchanged.   Changes to medications: Sophia Davis reports no changes at this time.  Changes to insurance: No  New side effects reported not previously addressed with a pharmacist or physician: None reported  Questions for the pharmacist: No    Confirmed patient received a Conservation officer, historic buildings and a Surveyor, mining with first shipment. The patient will receive a drug information handout for each medication shipped and additional FDA Medication Guides as required.       DISEASE/MEDICATION-SPECIFIC INFORMATION        N/A    SPECIALTY MEDICATION ADHERENCE     Medication Adherence    Patient reported X missed doses in the last month: 0  Specialty Medication: Myfortic 180mg   Patient is on additional specialty medications: Yes  Additional Specialty Medications: Tacrolimus 0.5mg   Patient Reported Additional Medication X Missed Doses in the Last Month: 0  Patient is on more than two specialty medications: No  Adherence tools used: patient uses a pill box to manage medications        Were doses missed due to medication being on hold? No    Myfortic 180 mg: 12 days of medicine on hand   Tacrolimus 1 mg: 12 days of medicine on hand     REFERRAL TO PHARMACIST     Referral to the pharmacist: Not needed      [facility]     Shipping address confirmed in Epic.     Delivery Scheduled: Yes, Expected medication delivery date: 06/12/2021.     Medication will be delivered via Next Day Courier to the prescription address in Epic WAM.    [name]   [facility] Specialty Technician

## 2021-06-06 LAB — BASIC METABOLIC PANEL
ANION GAP: 6 mL — ABNORMAL LOW (ref 8.0–16.0)
BLOOD UREA NITROGEN: 24 mg/dL — ABNORMAL HIGH (ref 7–18)
BUN / CREAT RATIO: 16 mL — ABNORMAL HIGH (ref 10.0–15.0)
CALCIUM: 9.2 mg/dL (ref 8.4–10.2)
CHLORIDE: 104 mmol/L (ref 101–111)
CO2: 20 mmol/L — ABNORMAL LOW (ref 21–31)
CREATININE: 1.5 mg/dL — ABNORMAL HIGH (ref 0.6–1.3)
EGFR CKD-EPI NON-AA MALE: 33 mL
GLUCOSE RANDOM: 102 mg/dL — ABNORMAL HIGH (ref 65–99)
MAGNESIUM: 1.5 mg/dL — ABNORMAL LOW (ref 1.7–2.8)
PHOSPHORUS: 3.8 mg/dL (ref 2.4–4.4)
POTASSIUM: 3.8 mmol/L (ref 3.5–5.1)
SODIUM: 130 mmol/L — ABNORMAL LOW (ref 135–145)

## 2021-06-06 MED ORDER — TRIAMTERENE 37.5 MG-HYDROCHLOROTHIAZIDE 25 MG CAPSULE
ORAL_CAPSULE | Freq: Every morning | ORAL | 11 refills | 30.00000 days | Status: CP
Start: 2021-06-06 — End: 2022-06-06
  Filled 2021-06-11: qty 30, 30d supply, fill #0

## 2021-06-06 NOTE — Unmapped (Signed)
Called [place] lab to see if they can fax pt's lab report to [facility].  Spoke with Claris Gower at lab, she said they will try to fax results again. Fax number given

## 2021-06-11 MED FILL — LOSARTAN 50 MG TABLET: ORAL | 90 days supply | Qty: 180 | Fill #1

## 2021-06-11 MED FILL — AMLODIPINE 2.5 MG TABLET: ORAL | 90 days supply | Qty: 90 | Fill #1

## 2021-06-11 MED FILL — TACROLIMUS 0.5 MG CAPSULE, IMMEDIATE-RELEASE: ORAL | 30 days supply | Qty: 150 | Fill #8

## 2021-06-11 MED FILL — MYFORTIC 180 MG TABLET,DELAYED RELEASE: ORAL | 30 days supply | Qty: 120 | Fill #5

## 2021-06-11 MED FILL — CHOLECALCIFEROL (VITAMIN D3) 25 MCG (1,000 UNIT) TABLET: ORAL | 100 days supply | Qty: 100 | Fill #1

## 2021-06-11 MED FILL — ALENDRONATE 70 MG TABLET: ORAL | 84 days supply | Qty: 12 | Fill #1

## 2021-06-27 DIAGNOSIS — Z940 Kidney transplant status: Principal | ICD-10-CM

## 2021-07-02 ENCOUNTER — Ambulatory Visit: Admit: 2021-07-02 | Discharge: 2021-07-02 | Payer: MEDICARE

## 2021-07-02 DIAGNOSIS — N2581 Secondary hyperparathyroidism of renal origin: Principal | ICD-10-CM

## 2021-07-02 DIAGNOSIS — Z940 Kidney transplant status: Principal | ICD-10-CM

## 2021-07-02 DIAGNOSIS — M3130 Wegener's granulomatosis without renal involvement: Principal | ICD-10-CM

## 2021-07-02 DIAGNOSIS — C539 Malignant neoplasm of cervix uteri, unspecified: Principal | ICD-10-CM

## 2021-07-02 DIAGNOSIS — I10 Essential (primary) hypertension: Principal | ICD-10-CM

## 2021-07-02 LAB — COMPREHENSIVE METABOLIC PANEL
ALBUMIN: 3.8 g/dL (ref 3.4–5.0)
ALKALINE PHOSPHATASE: 67 U/L (ref 46–116)
ALT (SGPT): 7 U/L — ABNORMAL LOW (ref 10–49)
ANION GAP: 7 mmol/L (ref 5–14)
AST (SGOT): 13 U/L (ref <=34)
BILIRUBIN TOTAL: 0.6 mg/dL (ref 0.3–1.2)
BLOOD UREA NITROGEN: 17 mg/dL (ref 9–23)
BUN / CREAT RATIO: 12
CALCIUM: 9.2 mg/dL (ref 8.7–10.4)
CHLORIDE: 107 mmol/L (ref 98–107)
CO2: 25 mmol/L (ref 20.0–31.0)
CREATININE: 1.45 mg/dL — ABNORMAL HIGH
EGFR CKD-EPI (2021) FEMALE: 37 mL/min/{1.73_m2} — ABNORMAL LOW (ref >=60)
GLUCOSE RANDOM: 111 mg/dL — ABNORMAL HIGH (ref 70–99)
POTASSIUM: 3.6 mmol/L (ref 3.4–4.8)
PROTEIN TOTAL: 6.1 g/dL (ref 5.7–8.2)
SODIUM: 139 mmol/L (ref 135–145)

## 2021-07-02 LAB — CBC W/ AUTO DIFF
BASOPHILS ABSOLUTE COUNT: 0 10*9/L (ref 0.0–0.1)
BASOPHILS RELATIVE PERCENT: 0.8 %
EOSINOPHILS ABSOLUTE COUNT: 0.1 10*9/L (ref 0.0–0.5)
EOSINOPHILS RELATIVE PERCENT: 1.9 %
HEMATOCRIT: 30.8 % — ABNORMAL LOW (ref 34.0–44.0)
HEMOGLOBIN: 10.2 g/dL — ABNORMAL LOW (ref 11.3–14.9)
LYMPHOCYTES ABSOLUTE COUNT: 0.7 10*9/L — ABNORMAL LOW (ref 1.1–3.6)
LYMPHOCYTES RELATIVE PERCENT: 16.5 %
MEAN CORPUSCULAR HEMOGLOBIN CONC: 33.3 g/dL (ref 32.0–36.0)
MEAN CORPUSCULAR HEMOGLOBIN: 27.7 pg (ref 25.9–32.4)
MEAN CORPUSCULAR VOLUME: 83.5 fL (ref 77.6–95.7)
MEAN PLATELET VOLUME: 8 fL (ref 6.8–10.7)
MONOCYTES ABSOLUTE COUNT: 0.5 10*9/L (ref 0.3–0.8)
MONOCYTES RELATIVE PERCENT: 12.4 %
NEUTROPHILS ABSOLUTE COUNT: 2.8 10*9/L (ref 1.8–7.8)
NEUTROPHILS RELATIVE PERCENT: 68.4 %
NUCLEATED RED BLOOD CELLS: 0 /100{WBCs} (ref <=4)
PLATELET COUNT: 237 10*9/L (ref 150–450)
RED BLOOD CELL COUNT: 3.69 10*12/L — ABNORMAL LOW (ref 3.95–5.13)
RED CELL DISTRIBUTION WIDTH: 14.8 % (ref 12.2–15.2)
WBC ADJUSTED: 4.1 10*9/L (ref 3.6–11.2)

## 2021-07-02 LAB — TACROLIMUS LEVEL, TROUGH: TACROLIMUS, TROUGH: 4.7 ng/mL — ABNORMAL LOW (ref 5.0–15.0)

## 2021-07-02 NOTE — Unmapped (Signed)
[facility]     Type of visit:  face to face    Reason for visit: Follow up    General Consent to Treat (GCT) for non-epic video visits only: Verbal consent    Screening BP- 164/65    Omron BPs (complete if screening BP has a systolic  > 139 or diastolic > 89)  BP#1 173/65   BP#2 165/66  BP#3 160/60    Average BP 166/63  (please note this as a comment in vitals)     Allergies reviewed: Yes    Medication reviewed: Yes  Pended refills? Yes    HCDM reviewed and updated in Epic:    We are working to make sure all of our patients??? wishes are updated in Epic and part of that is documenting a Environmental health practitioner for each patient  A Health Care Decision Maker is someone you choose who can make health care decisions for you if you are not able - who would you most want to do this for you????  is already up to date.    HCDM (other legal document): Ellsworth Lennox - Son - [phone]    [facility] (patient stated preference): Lacey Jensen - Daughter - [phone]      BPAs completed:  AUDIT - Alcohol Screen  HARK - Interpersonal Violence      COVID-19 Vaccine Summary  Which COVID-19 Vaccine was administered  Moderna  Type:  Complete  Dates Given:  10/02/2020  Due Date for next dose:   10/30/2020    Date last COVID Positive Test:   11/10/2020      Date last mAB infusion:  07/17/2020        Immunization History   Administered Date(s) Administered   ??? COVID-19 VACC,(JANSSEN)(PF)(IM) 01/11/2020   ??? COVID-19 VACCINE,MRNA(MODERNA)(PF)(IM) 10/02/2020   ??? INFLUENZA TIV (TRI) PF (IM) 08/30/2006, 09/14/2007, 08/14/2009, 08/26/2011, 07/27/2012   ??? Influenza TRI (IIV3) 4+yrs PF 08/15/2013   ??? Influenza Vaccine Quad (IIV4 PF) 86mo+ injectable 07/04/2014, 07/23/2015, 10/06/2016, 07/27/2017, 06/28/2018, 07/12/2019, 10/02/2020   ??? PNEUMOCOCCAL POLYSACCHARIDE 23 06/22/2003, 08/12/2015   ??? Pneumococcal Conjugate 13-Valent 09/26/2013   ??? Pneumococcal Conjugate 20-valent 03/21/2021   ??? SHINGRIX-ZOSTER VACCINE (HZV), RECOMBINANT,SUB-UNIT,ADJUVANTED IM 01/12/2018, 06/17/2018       __________________________________________________________________________________________    SCREENINGS COMPLETED IN FLOWSHEETS    HARK Screening  HARK Screening  Within the last year, have you been humiliated or emotionally abused in other ways by your partner or ex-partner?: No  Within the last year, have you been afraid of your partner or ex-partner?: No  Within the last year, have you been raped or forced to have any kind of sexual activity by your partner or ex-partner?: No  Within the last year, have you been kicked, hit, slapped, or otherwise physically hurt by your partner or ex-partner?: No    AUDIT  AUDIT - C Score (Part 1): 0

## 2021-07-02 NOTE — Unmapped (Addendum)
For amlodipine, start taking 5 mg each night (that will be 2 tablets).    Call in blood pressure readings in 3 weeks. At that time, we will decide whether adjustments are needed. Number is [phone]    Sleep program for phone:    Download: CBT-i Coach

## 2021-07-03 LAB — CMV DNA, QUANTITATIVE, PCR
CMV QUANT LOG10: 2.56 {Log_IU}/mL — ABNORMAL HIGH (ref <0.00)
CMV QUANT: 361 [IU]/mL — ABNORMAL HIGH (ref <0)
CMV VIRAL LD: DETECTED — AB

## 2021-07-04 MED ORDER — AMLODIPINE 2.5 MG TABLET
ORAL_TABLET | Freq: Every day | ORAL | 2 refills | 45 days | Status: CP
Start: 2021-07-04 — End: ?
  Filled 2021-07-15: qty 90, 45d supply, fill #0

## 2021-07-04 NOTE — Unmapped (Signed)
[facility] Clinical Assessment & Refill Coordination Note    Sophia Davis, DOB: 03-25-1946  Phone: [phone] (home)     All above HIPAA information was verified with patient.     Was a Nurse, learning disability used for this call? No    Specialty Medication(s):   Transplant: Myfortic 180mg  and tacrolimus 0.5mg      Current Outpatient Medications   Medication Sig Dispense Refill   ??? alendronate (FOSAMAX) 70 MG tablet Take 1 tablet (70 mg total) by mouth every seven (7) days. 12 tablet 2   ??? amLODIPine (NORVASC) 2.5 MG tablet Take 2 tablets (5 mg total) by mouth daily. 90 tablet 2   ??? aspirin 81 MG chewable tablet Chew 81 mg daily.     ??? atorvastatin (LIPITOR) 40 MG tablet Take 1 tablet by mouth every evening 90 tablet 3   ??? cholecalciferol, vitamin D3 25 mcg, 1,000 units,, 1,000 unit (25 mcg) tablet Take 1 tablet (25 mcg total) by mouth daily. 90 tablet 3   ??? famotidine (PEPCID) 20 MG tablet TAKE 1 TABLET (20 MG TOTAL) BY MOUTH TWO (2) TIMES A DAY AS NEEDED FOR HEARTBURN. 180 tablet 3   ??? losartan (COZAAR) 50 MG tablet Take 1 tablet (50 mg total) by mouth two (2) times a day. 180 tablet PRN   ??? MYFORTIC 180 mg EC tablet Take 2 tablets (360 mg total) by mouth two (2) times a day. 120 tablet PRN   ??? tacrolimus (PROGRAF) 0.5 MG capsule Take 3 capsules (1.5 mg total) by mouth in the morning AND 2 capsules (1 mg total) in the evening. 150 capsule 11   ??? traZODone (DESYREL) 50 MG tablet TAKE 1 TABLET BY MOUTH EVERY DAY AT NIGHT (Patient taking differently: Take 50 mg by mouth nightly as needed.) 90 tablet 2   ??? triamterene-hydrochlorothiazide (DYAZIDE) 37.5-25 mg per capsule Take 1 capsule by mouth every morning. 30 capsule 11     No current facility-administered medications for this visit.        Changes to medications: increased norvasc dose    No Known Allergies    Changes to allergies: No    SPECIALTY MEDICATION ADHERENCE     Tacrolimus 0.5mg   : 10 days of medicine on hand   Myfortic 180mg  : 10 days of medicine on hand     Medication Adherence    Patient reported X missed doses in the last month: 0  Specialty Medication: tacrolimus 0.5mg   Patient is on additional specialty medications: Yes  Additional Specialty Medications: Myfortic 180mg   Patient Reported Additional Medication X Missed Doses in the Last Month: 0  Adherence tools used: patient uses a pill box to manage medications          Specialty medication(s) dose(s) confirmed: Regimen is correct and unchanged.     Are there any concerns with adherence? No    Adherence counseling provided? Not needed    CLINICAL MANAGEMENT AND INTERVENTION      Clinical Benefit Assessment:    Do you feel the medicine is effective or helping your condition? Yes    Clinical Benefit counseling provided? Not needed    Adverse Effects Assessment:    Are you experiencing any side effects? No    Are you experiencing difficulty administering your medicine? No    Quality of Life Assessment:         How many days over the past month did your transplant  keep you from your normal activities? For  example, brushing your teeth or getting up in the morning. 0    Have you discussed this with your provider? Not needed    Acute Infection Status:    Acute infections noted within Epic:  No active infections  Patient reported infection: None    Therapy Appropriateness:    Is therapy appropriate? Yes, therapy is appropriate and should be continued    DISEASE/MEDICATION-SPECIFIC INFORMATION      N/A    PATIENT SPECIFIC NEEDS     - Does the patient have any physical, cognitive, or cultural barriers? No    - Is the patient high risk? Yes, patient is taking a REMS drug. Medication is dispensed in compliance with REMS program    - Does the patient require a Care Management Plan? No     - Does the patient require physician intervention or other additional services (i.e. nutrition, smoking cessation, social work)? No      SHIPPING     Specialty Medication(s) to be Shipped:   Transplant: Myfortic 180mg  and tacrolimus 0.5mg     Other medication(s) to be shipped: dyazaide, lipitor, norvasc     Changes to insurance: No    Delivery Scheduled: Yes, Expected medication delivery date: 9/22/20221.  However, Rx request for refills was sent to the provider as there are none remaining.     Medication will be delivered via Next Day Courier to the confirmed prescription address in [facility].    The patient will receive a drug information handout for each medication shipped and additional FDA Medication Guides as required.  Verified that patient has previously received a Conservation officer, historic buildings and a Surveyor, mining.    The patient or caregiver noted above participated in the development of this care plan and knows that they can request review of or adjustments to the care plan at any time.      All of the patient's questions and concerns have been addressed.    Thad Ranger   [facility] Specialty Pharmacist

## 2021-07-07 NOTE — Unmapped (Signed)
Per Dr. Toni Arthurs: please decrease mycophenolate from 360mg  to 180mg  bid d/t low level cmv viremia and repeat labs incr cmv pcr in 2 weeks    Called and spoke with Steward Drone, she is home now. She was notified that her CMV level has come down but she should start taking Myfortic 180 mg two times per day instead of 360 mg BID, she states that she took 360 mg this am but will start with new dose tonight.   Pt was asked to get labs in 2 weeks from medication change. This TNC told pt she would call her in 2 weeks to remind her to get labs.     Pt with no other needs at this time

## 2021-07-08 NOTE — Unmapped (Signed)
Internal Medicine Clinic Visit    Reason for visit: Follow-up    A/P:        Sophia Davis was seen today for follow-up.    Diagnoses and all orders for this visit:    Kidney transplant 05/17/2013  -     CMV Quantitative PCR, Blood  She does have CMV viral load.  I will defer to the transplant team on whether they would like to initiate therapy for this.  Per note by transplant nurse coordinator, they will decrease the dose of mycophenolate and recheck CMV PCR  Granulomatosis with polyangiitis, unspecified whether renal involvement (CMS-HCC)  This problem led to her kidney transplant.  The polyangiitis seems to be quiescent at this time she is on immunosuppressive therapies.  Malignant neoplasm of cervix, unspecified site (CMS-HCC)  She had a TAH/BSO and no further screening is necessary at this time.  No signs of recurrence.  Secondary hyperparathyroidism (CMS-HCC)  pH was December 2021.  Continue on vitamin D.  Essential hypertension (RAF-HCC)  Blood pressure is quite elevated today in clinic.  She thinks this is related to her stress.  She notes that it is sometimes over 130 at home.  I do think important to keep her blood pressure under control.  We have agreed to go ahead and increase her amlodipine from 2.5 mg to 5 mg daily and she will call my clinic with blood pressures in about 3 weeks we can decide on further adjusting her medications at that time.  Other orders  -     Discontinue: amLODIPine (NORVASC) 2.5 MG tablet; Take 2 tablets (5 mg total) by mouth daily.  -     Cancel: INFLUENZA VACCINE (QUAD) IM - 6 MO-ADULT - PF  -     INFLUENZA QUAD ADJUVANTED PF 52YRS+ (FLUAD)  Weight loss  I am concerned about the weight loss but somewhat reassured that its been pretty steady for the past 3 months.  I have asked her to continue to monitor this.  She is up-to-date on her screenings.    Sleep: Patient notes she still has difficulty with sleep but would prefer to avoid any other medications for sleep.  She was interested in trying the CBT-I app so that information to her.    I personally spent 35 minutes face-to-face and non-face-to-face in the care of this patient, which includes all pre, intra, and post visit time on the date of service.    __________________________________________________________    HPI:    75 y.o. female here for follow-up of multiple problems.  She is overall doing okay but is experiencing some stress right now.  She is worried about her brother who has COPD and frustrated that he will listen to her.    She had a trip up to [place] earlier this summer during which she got sick with some diarrhea.  She still having some diarrhea at home and has also had some loss of appetite.  Things to start is appealing to her anymore.  She had COVID back in June and then this GI illness in August.    She has been losing weight.  Today she is at 112.  Last December she was at 124.  She was 113 pounds back in June 2022 so it appears that most of this weight was lost prior to June.      __________________________________________________________      Medications:  See below.    __________________________________________________________    Physical Exam:   Vital Signs:  Vitals:    07/02/21 1007   BP: 166/63   Pulse: 73   Resp: 16   Temp: 36.8 ??C (98.2 ??F)   TempSrc: Oral   SpO2: 97%   Weight: 50.8 kg (112 lb)   Height: 154.9 cm (5' 1)       Gen: Well appearing, NAD  HEENT: No cervical lymphadenopathy, OP clear. No thyromegaly  CV: RRR, no murmurs  Pulm: CTA bilaterally, no crackles or wheezes  GI: abdomen soft, NTND, normal BS. No HSM.  Pulses: 2+ radial  Ext: No edema         Your Medication List          Accurate as of July 02, 2021 11:59 PM. If you have any questions, ask your nurse or doctor.            CHANGE how you take these medications    amLODIPine 2.5 MG tablet  Commonly known as: NORVASC  Take 2 tablets (5 mg total) by mouth daily.  What changed: how much to take  Changed by: Rockne Menghini, MD     traZODone 50 MG tablet  Commonly known as: DESYREL  TAKE 1 TABLET BY MOUTH EVERY DAY AT NIGHT  What changed:   ?? when to take this  ?? reasons to take this        CONTINUE taking these medications    alendronate 70 MG tablet  Commonly known as: FOSAMAX  Take 1 tablet (70 mg total) by mouth every seven (7) days.     aspirin 81 MG chewable tablet  Chew 81 mg daily.     atorvastatin 40 MG tablet  Commonly known as: LIPITOR  Take 1 tablet by mouth every evening     cholecalciferol (vitamin D3 25 mcg (1,000 units)) 1,000 unit (25 mcg) tablet  Take 1 tablet (25 mcg total) by mouth daily.     famotidine 20 MG tablet  Commonly known as: PEPCID  TAKE 1 TABLET (20 MG TOTAL) BY MOUTH TWO (2) TIMES A DAY AS NEEDED FOR HEARTBURN.     losartan 50 MG tablet  Commonly known as: COZAAR  Take 1 tablet (50 mg total) by mouth two (2) times a day.     MYFORTIC 180 MG EC tablet  Generic drug: mycophenolate  Take 2 tablets (360 mg total) by mouth two (2) times a day.     tacrolimus 0.5 MG capsule  Commonly known as: PROGRAF  Take 3 capsules (1.5 mg total) by mouth in the morning AND 2 capsules (1 mg total) in the evening.     triamterene-hydrochlorothiazide 37.5-25 mg per capsule  Commonly known as: DYAZIDE  Take 1 capsule by mouth every morning.

## 2021-07-09 MED FILL — TACROLIMUS 0.5 MG CAPSULE, IMMEDIATE-RELEASE: ORAL | 30 days supply | Qty: 150 | Fill #9

## 2021-07-09 MED FILL — TRIAMTERENE 37.5 MG-HYDROCHLOROTHIAZIDE 25 MG CAPSULE: ORAL | 30 days supply | Qty: 30 | Fill #1

## 2021-07-09 MED FILL — ATORVASTATIN 40 MG TABLET: ORAL | 90 days supply | Qty: 90 | Fill #3

## 2021-07-09 MED FILL — MYFORTIC 180 MG TABLET,DELAYED RELEASE: ORAL | 30 days supply | Qty: 120 | Fill #6

## 2021-07-30 NOTE — Unmapped (Signed)
Other Call    ??? Caller name:Taylee  ??? Best callback number:[phone]  ??? Describe the reason for the call: Patient has 3 weeks of BP readings. Please call her and she will give them to you over the phone. She did state they have been running good.  ??? PCP: Rockne Menghini, MD  ??? Last encounter in department: 07/02/2021

## 2021-07-31 NOTE — Unmapped (Signed)
[facility] Refill Coordination Note    Specialty Medication(s) to be Shipped:   Transplant: Myfortic 180mg  and tacrolimus 0.5mg     Other medication(s) to be shipped: triamterene/hctz     Sophia Davis, DOB: Feb 27, 1946  Phone: [phone] (home)       All above HIPAA information was verified with patient.     Was a Nurse, learning disability used for this call? No    Completed refill call assessment today to schedule patient's medication shipment from the [facility] [phone]).  All relevant notes have been reviewed.     Specialty medication(s) and dose(s) confirmed: Regimen is correct and unchanged.   Changes to medications: Shaneika reports no changes at this time.  Changes to insurance: No  New side effects reported not previously addressed with a pharmacist or physician: None reported  Questions for the pharmacist: No    Confirmed patient received a Conservation officer, historic buildings and a Surveyor, mining with first shipment. The patient will receive a drug information handout for each medication shipped and additional FDA Medication Guides as required.       DISEASE/MEDICATION-SPECIFIC INFORMATION        N/A    SPECIALTY MEDICATION ADHERENCE     Medication Adherence    Patient reported X missed doses in the last month: 0  Specialty Medication: Myfortic 180mg   Patient is on additional specialty medications: Yes  Additional Specialty Medications: Tacrolimus 0.5mg   Patient Reported Additional Medication X Missed Doses in the Last Month: 0  Patient is on more than two specialty medications: No  Adherence tools used: patient uses a pill box to manage medications        Were doses missed due to medication being on hold? No    Myfortic 180 mg: 10 days of medicine on hand   Tacrolimus 0.5 mg: 10 days of medicine on hand     REFERRAL TO PHARMACIST     Referral to the pharmacist: Not needed      [facility]     Shipping address confirmed in Epic.     Delivery Scheduled: Yes, Expected medication delivery date: 08/06/2021.     Medication will be delivered via UPS to the prescription address in Epic WAM.    Lorelei Pont [facility] Specialty Technician

## 2021-07-31 NOTE — Unmapped (Signed)
BP Readings     9/15 - 136/67 P: 67  9/16 - 130/62 P: 73  9/17 - 123/59 P: 78  9/18 - 133/63 P: 72  9/19 - 110/56 P:64  9/20 - 125/55 P:61  9/21 - 127/65 P:62  9/22 - 126/59 P:68  9/23 - 129/65 P:60  9/24 - 122/67 P:61  9/25 - 129/62 P:62  9/26 - 124/64 P:67  9/27 - 132/66 P:63  9/28 - 132/58 P:59  9/30 - 132/66 P:61  10/1 - 132/67 P:67   10/2 - 129/56 P:63  10/3 - 132/68 P:63  10/4 - 129/56 P:67  10/5 - 132/68 P:68  10/6 - 131/52 P:69  10/7 - 125/64 P:69  10/8 - 125/64 P:65  10/9 - 133/60 P:69   10/13 - 126/65 P:53

## 2021-08-01 NOTE — Unmapped (Signed)
Average BP is 128/62 and this is acceptable.  We will continue on current therapy.  Dolores Hoose, MD

## 2021-08-01 NOTE — Unmapped (Signed)
Patient has been notified.  No further questions.

## 2021-08-06 MED FILL — TRIAMTERENE 37.5 MG-HYDROCHLOROTHIAZIDE 25 MG CAPSULE: ORAL | 30 days supply | Qty: 30 | Fill #2

## 2021-08-06 MED FILL — MYFORTIC 180 MG TABLET,DELAYED RELEASE: ORAL | 30 days supply | Qty: 120 | Fill #7

## 2021-08-06 MED FILL — TACROLIMUS 0.5 MG CAPSULE, IMMEDIATE-RELEASE: ORAL | 30 days supply | Qty: 150 | Fill #10

## 2021-08-28 DIAGNOSIS — Z940 Kidney transplant status: Principal | ICD-10-CM

## 2021-08-28 MED ORDER — MYFORTIC 180 MG TABLET,DELAYED RELEASE
ORAL_TABLET | Freq: Two times a day (BID) | ORAL | PRN refills | 30 days
Start: 2021-08-28 — End: 2022-08-28

## 2021-08-28 NOTE — Unmapped (Signed)
[facility] Refill Coordination Note    Specialty Medication(s) to be Shipped:   Transplant: Myfortic 180mg  and tacrolimus 0.5mg     Other medication(s) to be shipped: alendronate, amlodipine, Vit D, losartan, triam/hctz     Sophia Davis, DOB: 10-07-1946  Phone: [phone] (home)       All above HIPAA information was verified with patient.     Was a Nurse, learning disability used for this call? No    Completed refill call assessment today to schedule patient's medication shipment from the [facility] [phone]).  All relevant notes have been reviewed.     Specialty medication(s) and dose(s) confirmed: Regimen is correct and unchanged.   Changes to medications: Raygan reports no changes at this time.  Changes to insurance: No  New side effects reported not previously addressed with a pharmacist or physician: None reported  Questions for the pharmacist: No    Confirmed patient received a Conservation officer, historic buildings and a Surveyor, mining with first shipment. The patient will receive a drug information handout for each medication shipped and additional FDA Medication Guides as required.       DISEASE/MEDICATION-SPECIFIC INFORMATION        N/A    SPECIALTY MEDICATION ADHERENCE     Medication Adherence    Patient reported X missed doses in the last month: 0  Specialty Medication: Myfortic 180mg   Patient is on additional specialty medications: Yes  Additional Specialty Medications: Tacrolimus 0.5mg   Patient Reported Additional Medication X Missed Doses in the Last Month: 0  Patient is on more than two specialty medications: No  Adherence tools used: patient uses a pill box to manage medications              Were doses missed due to medication being on hold? No    Myfortic 180 mg: 10 days of medicine on hand   Tacrolimus 0.5 mg: 10 days of medicine on hand       REFERRAL TO PHARMACIST     Referral to the pharmacist: Not needed      [facility]     Shipping address confirmed in Epic.     Delivery Scheduled: Yes, Expected medication delivery date: 09/04/21.     Medication will be delivered via UPS to the prescription address in Epic WAM.    [name]   [facility] Specialty Pharmacist

## 2021-08-30 NOTE — Unmapped (Signed)
A user error has taken place: encounter opened in error, closed for administrative reasons.

## 2021-09-03 DIAGNOSIS — Z940 Kidney transplant status: Principal | ICD-10-CM

## 2021-09-03 MED ORDER — MYFORTIC 180 MG TABLET,DELAYED RELEASE
ORAL_TABLET | Freq: Two times a day (BID) | ORAL | PRN refills | 30 days | Status: CP
Start: 2021-09-03 — End: 2022-09-03
  Filled 2021-09-03: qty 120, 30d supply, fill #0

## 2021-09-03 MED FILL — CHOLECALCIFEROL (VITAMIN D3) 25 MCG (1,000 UNIT) TABLET: ORAL | 100 days supply | Qty: 100 | Fill #2

## 2021-09-03 MED FILL — TRIAMTERENE 37.5 MG-HYDROCHLOROTHIAZIDE 25 MG CAPSULE: ORAL | 30 days supply | Qty: 30 | Fill #3

## 2021-09-03 MED FILL — AMLODIPINE 2.5 MG TABLET: ORAL | 45 days supply | Qty: 90 | Fill #1

## 2021-09-03 MED FILL — ALENDRONATE 70 MG TABLET: ORAL | 84 days supply | Qty: 12 | Fill #2

## 2021-09-03 MED FILL — TACROLIMUS 0.5 MG CAPSULE, IMMEDIATE-RELEASE: ORAL | 30 days supply | Qty: 150 | Fill #11

## 2021-09-03 MED FILL — LOSARTAN 50 MG TABLET: ORAL | 90 days supply | Qty: 180 | Fill #2

## 2021-09-24 DIAGNOSIS — Z940 Kidney transplant status: Principal | ICD-10-CM

## 2021-09-24 DIAGNOSIS — D849 Immunodeficiency, unspecified: Principal | ICD-10-CM

## 2021-09-24 MED ORDER — ATORVASTATIN 40 MG TABLET
ORAL_TABLET | Freq: Every evening | ORAL | 3 refills | 90 days
Start: 2021-09-24 — End: 2022-09-24

## 2021-09-24 MED ORDER — TACROLIMUS 0.5 MG CAPSULE, IMMEDIATE-RELEASE
ORAL_CAPSULE | ORAL | 11 refills | 30 days
Start: 2021-09-24 — End: ?

## 2021-09-24 NOTE — Unmapped (Addendum)
[facility] Pharm[facility] Refill Coordination Note    Specialty Medication(s) to be Shipped:   Transplant: Myfortic 180mg  and tacrolimus 0.5mg     Other medication(s) to be shipped: atorvastatin and triamterene/hctz     Sophia Davis, DOB: July 23, 1946  Phone: [phone] (home)       All above HIPAA information was verified with patient.     Was a Nurse, learning disability used for this call? No    Completed refill call assessment today to schedule patient's medication shipment from the [facility] [phone]).  All relevant notes have been reviewed.     Specialty medication(s) and dose(s) confirmed: Regimen is correct and unchanged.   Changes to medications: [name] reports no changes at this time.  Changes to insurance: No  New side effects reported not previously addressed with a pharmacist or physician: None reported  Questions for the pharmacist: No    Confirmed patient received a Conservation officer, historic buildings and a Surveyor, mining with first shipment. The patient will receive a drug information handout for each medication shipped and additional FDA Medication Guides as required.       DISEASE/MEDICATION-SPECIFIC INFORMATION        N/A    SPECIALTY MEDICATION ADHERENCE     Medication Adherence    Patient reported X missed doses in the last month: 0  Specialty Medication: Myfortic 180 mg tablets  Patient is on additional specialty medications: Yes  Additional Specialty Medications: Tacrolimus 0.5 mg capsules  Patient Reported Additional Medication X Missed Doses in the Last Month: 0  Patient is on more than two specialty medications: No  Any gaps in refill history greater than 2 weeks in the last 3 months: no  Demonstrates understanding of importance of adherence: yes  Informant: patient  Reliability of informant: reliable  Reasons for non-adherence: no problems identified  Adherence tools used: patient uses a pill box to manage medications  Confirmed plan for next specialty medication refill: delivery by pharmacy  Refills needed for supportive medications: yes, ordered or provider notified          Refill Coordination    Has the Patients' Contact Information Changed: No  Is the Shipping Address Different: No         Were doses missed due to medication being on hold? No    Myfortic 180 mg: about 10 days of medicine on hand, patient is not currently at home.  Tacrolimus 0.5 mg: 10 days of medicine on hand, patient is not currently at home.    REFERRAL TO PHARMACIST     Referral to the pharmacist: Not needed      [facility]     Shipping address confirmed in Epic.     Delivery Scheduled: Yes, Expected medication delivery date: 10/02/21.  However, Rx request for refills was sent to the provider as there are none remaining.     Medication will be delivered via UPS to the prescription address in Epic WAM.    Threasa Beards   [facility] Specialty Technician

## 2021-09-25 MED ORDER — ATORVASTATIN 40 MG TABLET
ORAL_TABLET | Freq: Every evening | ORAL | 1 refills | 90 days | Status: CP
Start: 2021-09-25 — End: 2022-09-25
  Filled 2021-10-01: qty 90, 90d supply, fill #0

## 2021-09-25 MED ORDER — TACROLIMUS 0.5 MG CAPSULE, IMMEDIATE-RELEASE
ORAL_CAPSULE | ORAL | 11 refills | 30 days | Status: CP
Start: 2021-09-25 — End: ?
  Filled 2021-10-01: qty 150, 30d supply, fill #0

## 2021-10-01 MED FILL — TRIAMTERENE 37.5 MG-HYDROCHLOROTHIAZIDE 25 MG CAPSULE: ORAL | 30 days supply | Qty: 30 | Fill #4

## 2021-10-01 MED FILL — MYFORTIC 180 MG TABLET,DELAYED RELEASE: ORAL | 30 days supply | Qty: 120 | Fill #1

## 2021-10-31 NOTE — Unmapped (Signed)
[facility] Refill Coordination Note    Specialty Medication(s) to be Shipped:   Transplant: Myfortic 180mg  and tacrolimus 0.5mg     Other medication(s) to be shipped: amlodipine, triamterene/hctz     Sophia Davis, DOB: Apr 18, 1946  Phone: [phone] (home)       All above HIPAA information was verified with patient.     Was a Nurse, learning disability used for this call? No    Completed refill call assessment today to schedule patient's medication shipment from the [facility] [phone]).  All relevant notes have been reviewed.     Specialty medication(s) and dose(s) confirmed: Regimen is correct and unchanged.   Changes to medications: Sophia Davis reports no changes at this time.  Changes to insurance: No  New side effects reported not previously addressed with a pharmacist or physician: None reported  Questions for the pharmacist: No    Confirmed patient received a Conservation officer, historic buildings and a Surveyor, mining with first shipment. The patient will receive a drug information handout for each medication shipped and additional FDA Medication Guides as required.       DISEASE/MEDICATION-SPECIFIC INFORMATION        N/A    SPECIALTY MEDICATION ADHERENCE     Medication Adherence    Patient reported X missed doses in the last month: 0  Specialty Medication: Myfortic 180mg   Patient is on additional specialty medications: Yes  Additional Specialty Medications: Tacrolimus 0.5mg   Patient Reported Additional Medication X Missed Doses in the Last Month: 0  Patient is on more than two specialty medications: No  Adherence tools used: patient uses a pill box to manage medications              Were doses missed due to medication being on hold? No    Myfortic 180 mg: 10 days of medicine on hand   Tacrolimus 0.5 mg: 10 days of medicine on hand       REFERRAL TO PHARMACIST     Referral to the pharmacist: Not needed      [facility]     Shipping address confirmed in Epic.     Delivery Scheduled: Yes, Expected medication delivery date: 11/06/21.     Medication will be delivered via Next Day Courier to the prescription address in Epic WAM.    [name]   [facility] Specialty Pharmacist

## 2021-11-04 DIAGNOSIS — Z940 Kidney transplant status: Principal | ICD-10-CM

## 2021-11-04 DIAGNOSIS — N186 End stage renal disease: Principal | ICD-10-CM

## 2021-11-04 NOTE — Unmapped (Signed)
Pt called and asked if she should get labs before her appt on Friday. Calliope was asked to get labs at ET and then come up to clinic.  She was given the number for scheduling as she does not think she is going to be able to make it to her RUS and CXR. Lessa was told that she hasn't had a RUS since 2020 and she really needed to get one, she said that she wants to get it but just isn't sure she will be able to make it.   No other needs at this time

## 2021-11-05 MED FILL — AMLODIPINE 2.5 MG TABLET: ORAL | 45 days supply | Qty: 90 | Fill #2

## 2021-11-05 MED FILL — MYFORTIC 180 MG TABLET,DELAYED RELEASE: ORAL | 30 days supply | Qty: 120 | Fill #2

## 2021-11-05 MED FILL — TACROLIMUS 0.5 MG CAPSULE, IMMEDIATE-RELEASE: ORAL | 30 days supply | Qty: 150 | Fill #1

## 2021-11-05 MED FILL — TRIAMTERENE 37.5 MG-HYDROCHLOROTHIAZIDE 25 MG CAPSULE: ORAL | 30 days supply | Qty: 30 | Fill #5

## 2021-11-07 ENCOUNTER — Ambulatory Visit: Admit: 2021-11-07 | Discharge: 2021-11-08 | Payer: MEDICARE

## 2021-11-07 ENCOUNTER — Ambulatory Visit: Admit: 2021-11-07 | Discharge: 2021-11-08 | Payer: MEDICARE | Attending: Nephrology | Primary: Nephrology

## 2021-11-07 DIAGNOSIS — Z940 Kidney transplant status: Principal | ICD-10-CM

## 2021-11-07 LAB — CBC W/ AUTO DIFF
BASOPHILS ABSOLUTE COUNT: 0 10*9/L (ref 0.0–0.1)
BASOPHILS RELATIVE PERCENT: 0.8 %
EOSINOPHILS ABSOLUTE COUNT: 0.1 10*9/L (ref 0.0–0.5)
EOSINOPHILS RELATIVE PERCENT: 1.7 %
HEMATOCRIT: 36 % (ref 34.0–44.0)
HEMOGLOBIN: 11.8 g/dL (ref 11.3–14.9)
LYMPHOCYTES ABSOLUTE COUNT: 0.8 10*9/L — ABNORMAL LOW (ref 1.1–3.6)
LYMPHOCYTES RELATIVE PERCENT: 18.7 %
MEAN CORPUSCULAR HEMOGLOBIN CONC: 32.6 g/dL (ref 32.0–36.0)
MEAN CORPUSCULAR HEMOGLOBIN: 26.7 pg (ref 25.9–32.4)
MEAN CORPUSCULAR VOLUME: 81.9 fL (ref 77.6–95.7)
MEAN PLATELET VOLUME: 7.6 fL (ref 6.8–10.7)
MONOCYTES ABSOLUTE COUNT: 0.5 10*9/L (ref 0.3–0.8)
MONOCYTES RELATIVE PERCENT: 10.2 %
NEUTROPHILS ABSOLUTE COUNT: 3.1 10*9/L (ref 1.8–7.8)
NEUTROPHILS RELATIVE PERCENT: 68.6 %
NUCLEATED RED BLOOD CELLS: 0 /100{WBCs} (ref <=4)
PLATELET COUNT: 215 10*9/L (ref 150–450)
RED BLOOD CELL COUNT: 4.4 10*12/L (ref 3.95–5.13)
RED CELL DISTRIBUTION WIDTH: 14.9 % (ref 12.2–15.2)
WBC ADJUSTED: 4.5 10*9/L (ref 3.6–11.2)

## 2021-11-07 LAB — URINALYSIS
BILIRUBIN UA: NEGATIVE
GLUCOSE UA: NEGATIVE
KETONES UA: NEGATIVE
NITRITE UA: NEGATIVE
PH UA: 6 (ref 5.0–9.0)
PROTEIN UA: NEGATIVE
RBC UA: 2 /HPF (ref <4)
SPECIFIC GRAVITY UA: 1.015 (ref 1.005–1.030)
SQUAMOUS EPITHELIAL: 1 /HPF (ref 0–5)
TRANSITIONAL EPITHELIAL: 2 /HPF (ref 0–2)
UROBILINOGEN UA: 0.2
WBC UA: >100 /HPF — ABNORMAL HIGH (ref 0–5)

## 2021-11-07 LAB — RENAL FUNCTION PANEL
ALBUMIN: 4.1 g/dL (ref 3.4–5.0)
ANION GAP: 8 mmol/L (ref 5–14)
BLOOD UREA NITROGEN: 13 mg/dL (ref 9–23)
BUN / CREAT RATIO: 10
CALCIUM: 9.7 mg/dL (ref 8.7–10.4)
CHLORIDE: 99 mmol/L (ref 98–107)
CO2: 25.9 mmol/L (ref 20.0–31.0)
CREATININE: 1.34 mg/dL — ABNORMAL HIGH
EGFR CKD-EPI (2021) FEMALE: 41 mL/min/{1.73_m2} — ABNORMAL LOW (ref >=60)
GLUCOSE RANDOM: 99 mg/dL (ref 70–99)
PHOSPHORUS: 3.9 mg/dL (ref 2.4–5.1)
POTASSIUM: 4.1 mmol/L (ref 3.4–4.8)
SODIUM: 133 mmol/L — ABNORMAL LOW (ref 135–145)

## 2021-11-07 LAB — MAGNESIUM: MAGNESIUM: 1.5 mg/dL — ABNORMAL LOW (ref 1.6–2.6)

## 2021-11-07 LAB — PROTEIN / CREATININE RATIO, URINE
CREATININE, URINE: 189.7 mg/dL
PROTEIN URINE: 22.9 mg/dL
PROTEIN/CREAT RATIO, URINE: 0.121

## 2021-11-07 MED ORDER — TRAZODONE 50 MG TABLET
ORAL_TABLET | Freq: Every evening | ORAL | 3 refills | 90 days | Status: CP
Start: 2021-11-07 — End: ?
  Filled 2021-12-01: qty 90, 90d supply, fill #0

## 2021-11-07 NOTE — Unmapped (Signed)
AOBP: left   arm medium  cuff   Average: 149/59 Pulse:55  1st reading: 150/57 Pulse:55  2nd reading:145/61  Pulse:56  3rd reading:152/58  Pulse:53

## 2021-11-07 NOTE — Unmapped (Signed)
Transplant Coordinator, Clinic Visit   Pt seen today by transplant nephrology for follow up, reviewed medications and symptoms.   Met with pt and daughter in law during clinic visit today. Pt doing ok. Pt reports that she is under a lot of stress, her brother recently died under questionable circumstances.     Assessment  BP: 149/59 today in clinic. Pt reports her BP's are anywhere from 130-150's/ 70's  Lightheaded: denies  Headache: denies  Hand tremors: dnies  Numbness/tingling: denies  Fevers: denies  Chills/sweats: denies  Shortness of breath: denies  Chest pain or pressure: denies  Palpitations: denies  Abdominal pain: denies  Heart burn: denies  Nausea/vomiting: denies  Diarrhea/constipation: denies  UTI symptoms: denies  Swelling: denies  Sleep: not sleeping well.    Good appetite; reports adequate hydration.     Any new medications? denies  Immunosuppressant last taken: last night at 2100    Immunization status: up to date on flu. Pt declines covid vaccines    Functional Score: 100   Normal no complaints; no evidence of  disease.   Employment status is: does not work    I spent a total of 10 minutes with Lashana reviewing medications and symptoms.

## 2021-11-08 LAB — CMV DNA, QUANTITATIVE, PCR
CMV QUANT LOG10: 1.71 {Log_IU}/mL — ABNORMAL HIGH (ref <0.00)
CMV QUANT: 51 [IU]/mL — ABNORMAL HIGH (ref <0)
CMV VIRAL LD: DETECTED — AB

## 2021-11-08 LAB — TACROLIMUS LEVEL, TROUGH: TACROLIMUS, TROUGH: 4.4 ng/mL — ABNORMAL LOW (ref 5.0–15.0)

## 2021-11-16 NOTE — Unmapped (Signed)
[facility] transplant nephrology clinic visit    assessment and plan  1. s/p kidney transplant 05/17/13. baseline creatinine 1-1.2 mg/dl. stage iiib chronic kidney disease. no proteinuria. no donor specific hla ab detected '22.   2. immunosuppression. mycophenolate 360mg  bid. tacrolimus 12hr lvl 4-7 ng/ml.  3. hypertension. blood pressure goal < 130/80 mmhg.  4. cmv viremia. asymptomatic.  5. health maintenance. influenza '21. pcv13 pneumococcal '20 administered. zoster '19. covid-19 janssen/moderna vaccine '21. mammogram '21. gyn exam '13/post hysterectomy. cystoscopy '14. colonoscopy '20. dermatology '21. kidney ultrasound '22.    history of present illness    Sophia Davis is a 76 year old woman seen in follow up post kidney transplant 05/17/2013. no fever chills or sweats. no headache or lightheaded. no chest pain cough or shortness of breath. no lower extremity edema. appetite nl. no abdominal pain no n/v/d. no myalgias or arthralgias. no dysuria hematuria or difficulty voiding. all other systems reviewed and negative x10 systems.    past medical hx:   1. s/p deceased donor kidney transplant 05/17/2013. mpo-anca+ chronic gn. alemtuzumab induction. baseline creatinine 1-1.2 mg/dl.  2. mpo-anca+ vasculitis/granulomatosis with polyangiitis. hx pulmonary hemorrhage. trtmt plasma exchange, iv methylprednisolone, iv/po cyclophosphamide x total 43m '03-'07; rituximab 1gm x2 '07.  3. hypertension   4. coronary artery disease s/p pci/stent '97. nl cards stress '13: lvef > 65%. no myocardial ischemia   5. hyperlipidemia  6. osteoporosis  7. secondary/tertiary hyperparathyroidism    past surgical hx: total abdominal hysterectomy '98. 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 kidney transplant '14. right hand 3rd/4th trigger finger release '16.    allergies: nkda     medications: tacrolimus 1.5mg /1mg  am/pm, mycophenolate sodium 360mg  bid, losartan 50mg  bid, amlodipine 2.5mg  q.pm, triamterene/hydrochlorothiazide 37.5mg /25mg  daily, atorvastatin 40mg  daily, aspirin 81mg  daily, vitamin d 1,000u daily, famotidine 20mg  prn, alendronate 70mg  q.week.    soc hx: widowed x3 children. distant smoking hx; none > 40 yrs.    physical exam: t96.1 p57 bp149/55 wt51.3kg bmi21.3. wd/wn woman appropriate affect and mood. nl sclera anicteric. mmm no thrush. neck supple no palpable ln. heart rrr nl s1s2 no m/r/g. lungs clear bilateral. abdomen soft nt/nd. no lower ext edema. msk no synovitis or tophi. skin no rash. neuro alert oriented non focal exam.

## 2021-11-25 DIAGNOSIS — Z940 Kidney transplant status: Principal | ICD-10-CM

## 2021-11-25 MED ORDER — MYFORTIC 180 MG TABLET,DELAYED RELEASE
ORAL_TABLET | Freq: Two times a day (BID) | ORAL | 3 refills | 90 days | Status: CP
Start: 2021-11-25 — End: 2022-02-23
  Filled 2021-12-01: qty 120, 30d supply, fill #0

## 2021-11-25 MED ORDER — ALENDRONATE 70 MG TABLET
ORAL_TABLET | ORAL | 2 refills | 84.00000 days | Status: CP
Start: 2021-11-25 — End: 2022-11-25
  Filled 2021-12-01: qty 12, 84d supply, fill #0

## 2021-11-25 NOTE — Unmapped (Signed)
Received message from pharmacy that pt reported she is taking Myfrotic 180 mg BID. Confirmed with Dr. Toni Arthurs that pt should be back to 360 mg BID as her CMV level is low.   Called and spoke with Steward Drone and let her know to increase Myfortic back to 360 mg BID, she will do this.   PT will get repeat labs next month.   No other needs at this time.

## 2021-11-25 NOTE — Unmapped (Addendum)
[facility] Clinical Assessment & Refill Coordination Note    Sophia Davis, DOB: Aug 29, 1946  Phone: [phone] (home)     All above HIPAA information was verified with patient.     Was a Nurse, learning disability used for this call? No    Specialty Medication(s):   Transplant: Myfortic 180mg  and tacrolimus 0.5mg      Current Outpatient Medications   Medication Sig Dispense Refill   ??? alendronate (FOSAMAX) 70 MG tablet Take 1 tablet (70 mg total) by mouth every seven (7) days. 12 tablet 2   ??? amLODIPine (NORVASC) 2.5 MG tablet Take 2 tablets (5 mg total) by mouth daily. 90 tablet 2   ??? aspirin 81 MG chewable tablet Chew 81 mg daily.     ??? atorvastatin (LIPITOR) 40 MG tablet Take 1 tablet by mouth every evening 90 tablet 1   ??? cholecalciferol, vitamin D3 25 mcg, 1,000 units,, 1,000 unit (25 mcg) tablet Take 1 tablet (25 mcg total) by mouth daily. 90 tablet 3   ??? famotidine (PEPCID) 20 MG tablet TAKE 1 TABLET (20 MG TOTAL) BY MOUTH TWO (2) TIMES A DAY AS NEEDED FOR HEARTBURN. 180 tablet 3   ??? losartan (COZAAR) 50 MG tablet Take 1 tablet (50 mg total) by mouth two (2) times a day. 180 tablet PRN   ??? MYFORTIC 180 mg EC tablet Take 2 tablets (360 mg total) by mouth two (2) times a day. (Patient taking differently: Take 180 mg by mouth two (2) times a day.) 120 tablet PRN   ??? tacrolimus (PROGRAF) 0.5 MG capsule Take 3 capsules (1.5 mg total) by mouth in the morning AND 2 capsules (1 mg total) in the evening. 150 capsule 11   ??? traZODone (DESYREL) 50 MG tablet Take 1 tablet (50 mg total) by mouth nightly. 90 tablet 3   ??? triamterene-hydrochlorothiazide (DYAZIDE) 37.5-25 mg per capsule Take 1 capsule by mouth every morning. 30 capsule 11     No current facility-administered medications for this visit.        Changes to medications: [name] reports no changes at this time.    No Known Allergies    Changes to allergies: No    SPECIALTY MEDICATION ADHERENCE     Myfortic 180 mg: 12 days of medicine on hand   Tacrolimus 0.5 mg: 12 days of medicine on hand       Medication Adherence    Patient reported X missed doses in the last month: 0  Specialty Medication: Myfortic 180mg   Patient is on additional specialty medications: Yes  Additional Specialty Medications: Tacrolimus 0.5mg   Patient Reported Additional Medication X Missed Doses in the Last Month: 0  Patient is on more than two specialty medications: No  Adherence tools used: patient uses a pill box to manage medications          Specialty medication(s) dose(s) confirmed: Regimen is correct and unchanged.     Are there any concerns with adherence? No    Adherence counseling provided? Not needed    CLINICAL MANAGEMENT AND INTERVENTION      Clinical Benefit Assessment:    Do you feel the medicine is effective or helping your condition? Yes    Clinical Benefit counseling provided? Not needed    Adverse Effects Assessment:    Are you experiencing any side effects? No    Are you experiencing difficulty administering your medicine? No    Quality of Life Assessment:         How  many days over the past month did your kidney transplant  keep you from your normal activities? For example, brushing your teeth or getting up in the morning. 0    Have you discussed this with your provider? Not needed    Acute Infection Status:    Acute infections noted within Epic:  No active infections  Patient reported infection: None    Therapy Appropriateness:    Is therapy appropriate and patient progressing towards therapeutic goals? Yes, therapy is appropriate and should be continued    DISEASE/MEDICATION-SPECIFIC INFORMATION      N/A    PATIENT SPECIFIC NEEDS     - Does the patient have any physical, cognitive, or cultural barriers? No    - Is the patient high risk? Yes, patient is taking a REMS drug. Medication is dispensed in compliance with REMS program    - Does the patient require a Care Management Plan? No     SOCIAL DETERMINANTS OF HEALTH     At the [facility], we have learned that life circumstances - like trouble affording food, housing, utilities, or transportation can affect the health of many of our patients.   That is why we wanted to ask: are you currently experiencing any life circumstances that are negatively impacting your health and/or quality of life? No    Social Determinants of Health     Food Insecurity: Not on file   Tobacco Use: Low Risk    ??? Smoking Tobacco Use: Never   ??? Smokeless Tobacco Use: Never   ??? Passive Exposure: Not on file   Transportation Needs: Not on file   Alcohol Use: Not At Risk   ??? How often do you have a drink containing alcohol?: Never   ??? How many drinks containing alcohol do you have on a typical day when you are drinking?: 1 - 2   ??? How often do you have 5 or more drinks on one occasion?: Never   Housing/Utilities: Not on file   Substance Use: Not on file   Financial Resource Strain: Not on file   Physical Activity: Not on file   Health Literacy: Not on file   Stress: Not on file   Intimate Partner Violence: Not on file   Depression: Not at risk   ??? PHQ-2 Score: 0   Social Connections: Not on file       Would you be willing to receive help with any of the needs that you have identified today? Not applicable       SHIPPING     Specialty Medication(s) to be Shipped:   Transplant: Myfortic 180mg  and tacrolimus 0.5mg     Other medication(s) to be shipped: Triamterene/HCTZ, alendronate, Vit D, Losartan, trazodone     Changes to insurance: No    Delivery Scheduled: Yes, Expected medication delivery date: 12/01/21.     Medication will be delivered via Same Day Courier to the confirmed prescription address in [facility].    The patient will receive a drug information handout for each medication shipped and additional FDA Medication Guides as required.  Verified that patient has previously received a Conservation officer, historic buildings and a Surveyor, mining.    The patient or caregiver noted above participated in the development of this care plan and knows that they can request review of or adjustments to the care plan at any time.      All of the patient's questions and concerns have been addressed.    [name]  [facility] Specialty Pharmacist

## 2021-11-25 NOTE — Unmapped (Signed)
Pt request for RX Refill alendronate (FOSAMAX) 70 MG tablet

## 2021-11-28 IMAGING — US US RENAL TRANSPLANT
1 series · 13 of 25 positions shown · non-contrast
Comparison: None.

CLINICAL DATA: Acute renal insufficiency, right lower quadrant
renal transplant

EXAM:
ULTRASOUND OF RENAL TRANSPLANT WITH RENAL DOPPLER ULTRASOUND
TECHNIQUE: Ultrasound examination of the renal transplant was performed with
gray-scale, color and duplex doppler evaluation.

[Series 1: general · 13 of 52 slices shown]
[im 1/52]
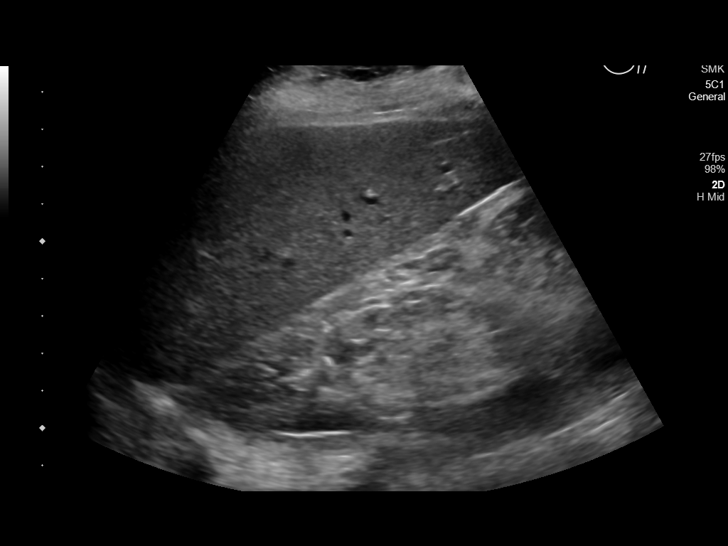
[im 5/52]
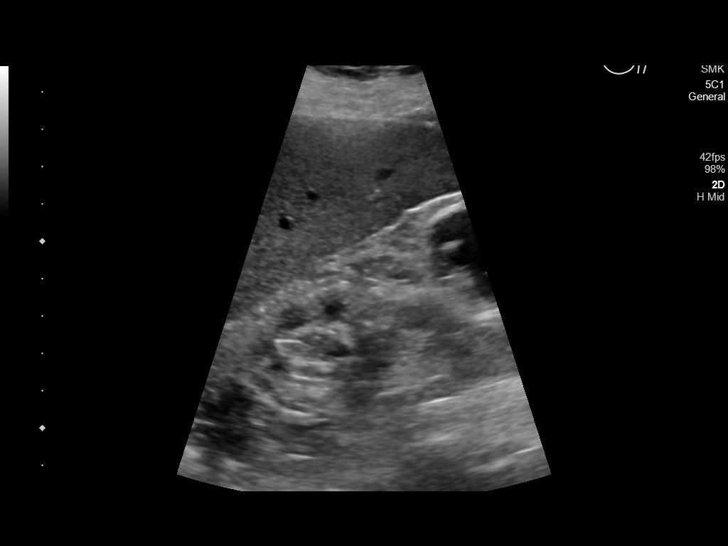
[im 9/52]
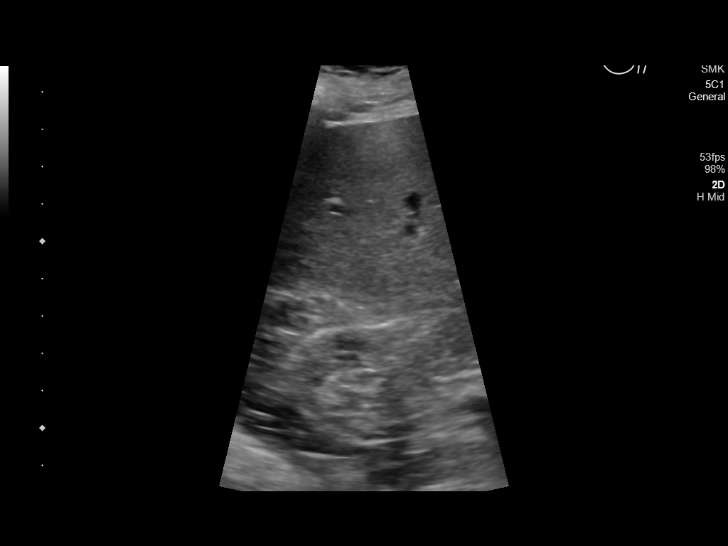
[im 13/52]
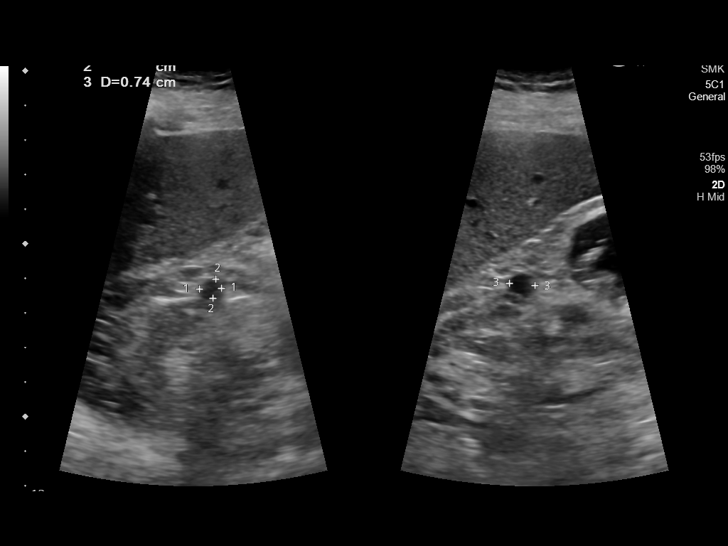
[im 18/52]
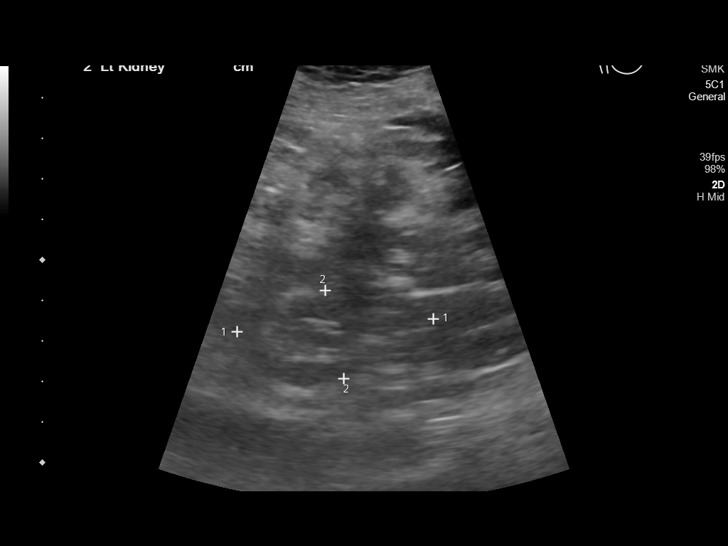
[im 22/52]
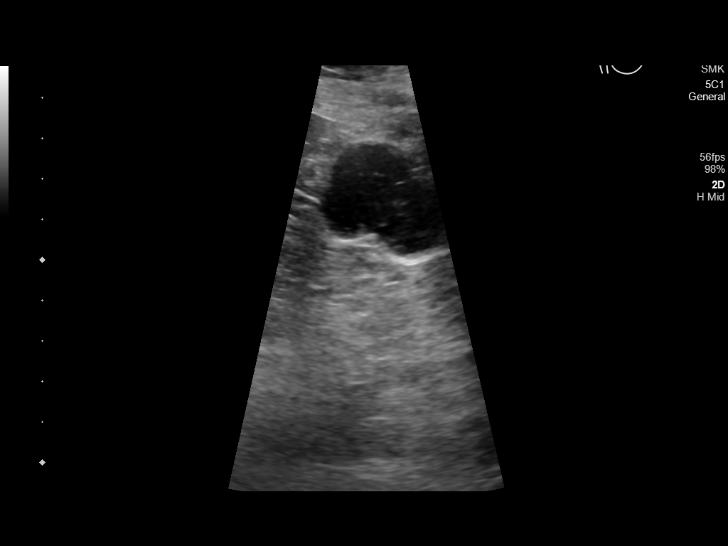
[im 26/52]
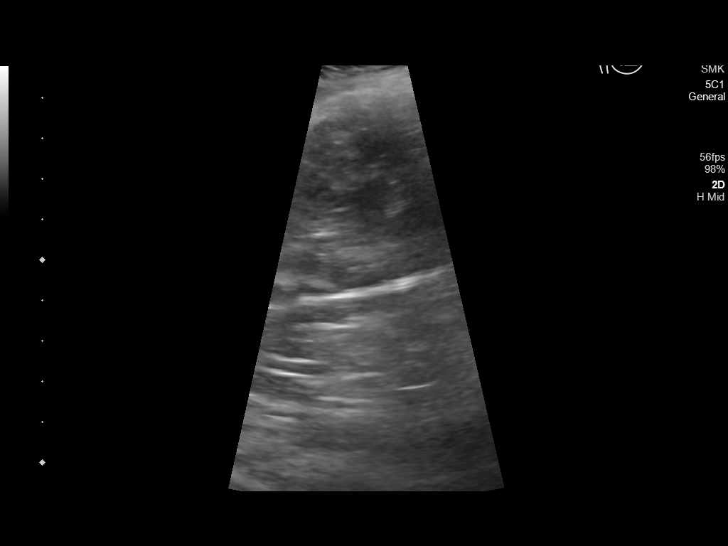
[im 30/52]
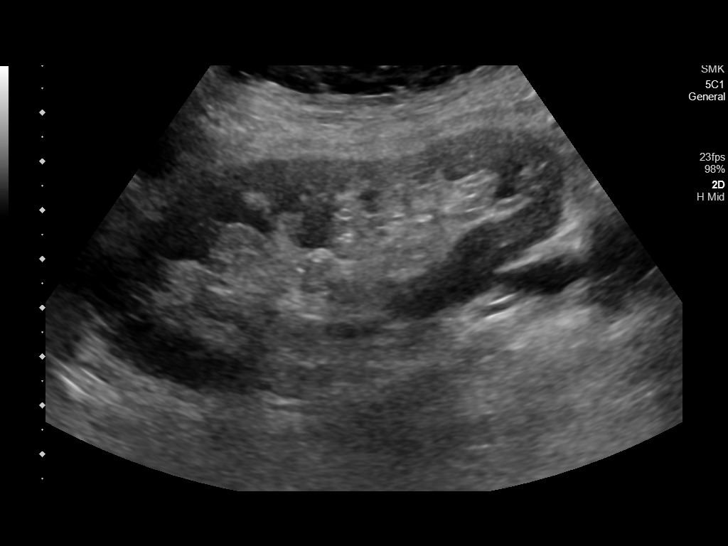
[im 35/52]
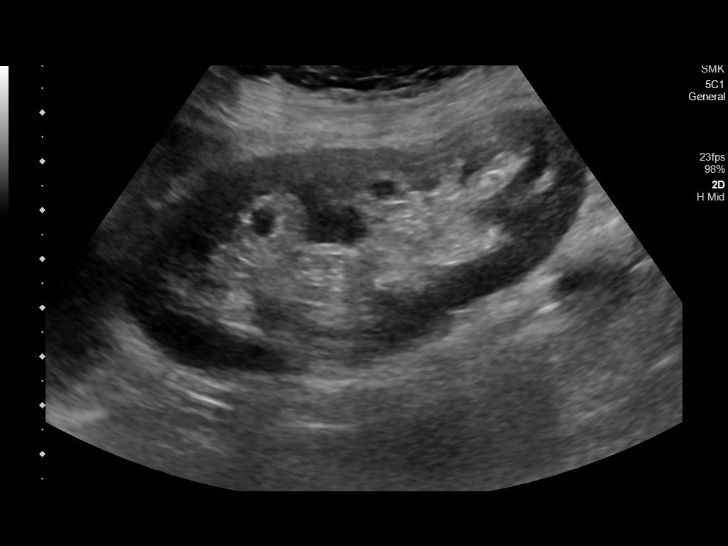
[im 39/52]
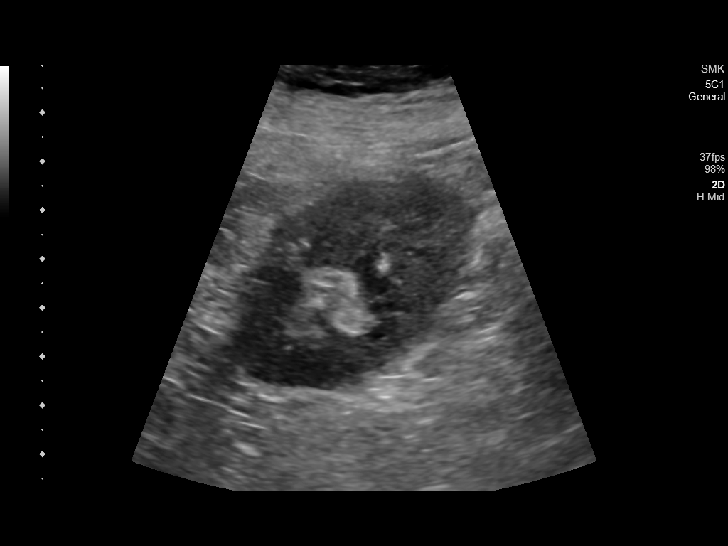
[im 43/52]
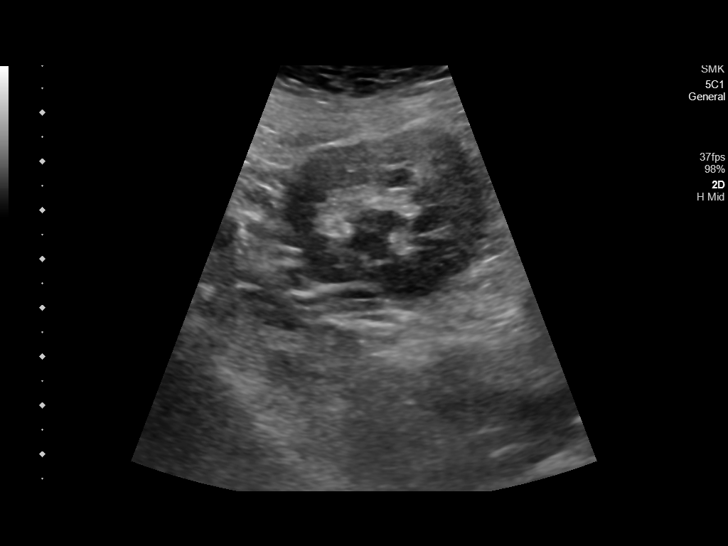
[im 47/52]
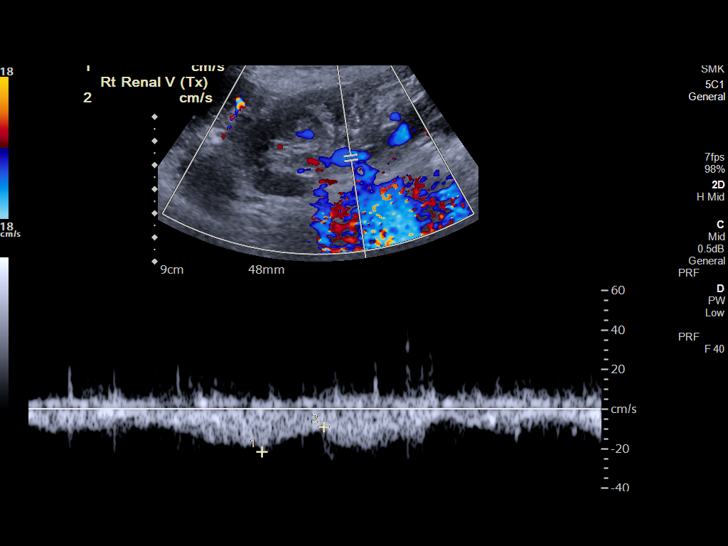
[im 52/52]
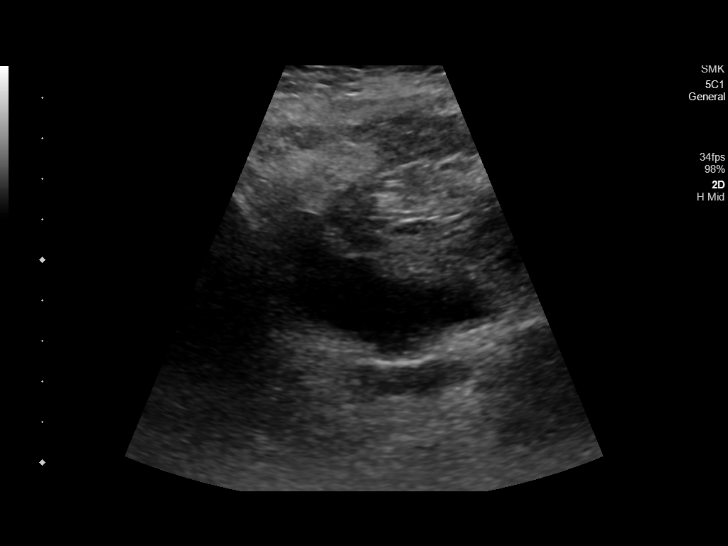

[13 of 25 positions shown; findings below may reference images not displayed]

FINDINGS: Transplant kidney location: RLQ

Transplant Kidney:

Renal measurements: 10.1 x 4.1 x 4.6 cm = volume: 98.6mL. Normal in
size and parenchymal echogenicity. No evidence of mass or
hydronephrosis. No peri-transplant fluid collection seen.

Color flow in the main renal artery:  Yes

Color flow in the main renal vein:  Yes

Duplex Doppler Evaluation:

Main Renal Artery Velocity: 67.4 cm/sec

Main Renal Artery Resistive Index:

Venous waveform in main renal vein:  Present

Intrarenal resistive index in upper pole:

(normal 0.6-0.8; equivocal 0.8-0.9; abnormal >= 0.9)

Intrarenal resistive index in lower pole:

(normal 0.6-0.8; equivocal 0.8-0.9; abnormal >= 0.9)

Bladder: Patient voided just prior to the exam, limiting evaluation
of the bladder.

Other findings: Native kidneys are atrophic, right kidney measuring
6.1 x 3.1 by 2.7 cm and the left kidney measuring 4.9 x 2.2 x
cm.
IMPRESSION: 1. Unremarkable right lower quadrant transplant kidney, with normal
Doppler interrogation.

## 2021-12-01 MED ORDER — TRAZODONE 50 MG TABLET
ORAL_TABLET | 2 refills | 0.00000 days
Start: 2021-12-01 — End: ?

## 2021-12-01 MED FILL — LOSARTAN 50 MG TABLET: ORAL | 90 days supply | Qty: 180 | Fill #3

## 2021-12-01 MED FILL — TACROLIMUS 0.5 MG CAPSULE, IMMEDIATE-RELEASE: ORAL | 30 days supply | Qty: 150 | Fill #2

## 2021-12-01 MED FILL — CHOLECALCIFEROL (VITAMIN D3) 25 MCG (1,000 UNIT) TABLET: ORAL | 100 days supply | Qty: 100 | Fill #3

## 2021-12-01 MED FILL — TRIAMTERENE 37.5 MG-HYDROCHLOROTHIAZIDE 25 MG CAPSULE: ORAL | 30 days supply | Qty: 30 | Fill #6

## 2021-12-29 MED ORDER — AMLODIPINE 2.5 MG TABLET
ORAL_TABLET | Freq: Every day | ORAL | 0 refills | 90 days | Status: CP
Start: 2021-12-29 — End: 2022-07-02
  Filled 2022-01-05: qty 180, 90d supply, fill #0

## 2021-12-29 NOTE — Unmapped (Signed)
[facility] Refill Coordination Note    Specialty Medication(s) to be Shipped:   Transplant: Myfortic 180mg  and tacrolimus 0.5mg     Other medication(s) to be shipped: atorvastatin, triam-hctz , and amlodipine     Sophia Davis, DOB: Nov 24, 1945  Phone: [phone] (home)       All above HIPAA information was verified with patient.     Was a Nurse, learning disability used for this call? No    Completed refill call assessment today to schedule patient's medication shipment from the [facility] [phone]).  All relevant notes have been reviewed.     Specialty medication(s) and dose(s) confirmed: Regimen is correct and unchanged.   Changes to medications: Bev reports no changes at this time.  Changes to insurance: No  New side effects reported not previously addressed with a pharmacist or physician: None reported  Questions for the pharmacist: No    Confirmed patient received a Conservation officer, historic buildings and a Surveyor, mining with first shipment. The patient will receive a drug information handout for each medication shipped and additional FDA Medication Guides as required.       DISEASE/MEDICATION-SPECIFIC INFORMATION        N/A    SPECIALTY MEDICATION ADHERENCE     Medication Adherence    Patient reported X missed doses in the last month: 0  Specialty Medication: Myfortic 180mg   Patient is on additional specialty medications: Yes  Additional Specialty Medications: Tacrolimus 0.5mg   Patient Reported Additional Medication X Missed Doses in the Last Month: 0  Patient is on more than two specialty medications: No  Adherence tools used: patient uses a pill box to manage medications              Were doses missed due to medication being on hold? No    Myfortic 180mg  mg: 7-10 days of medicine on hand   Tacrolimus 0.5mg  mg: 7-10 days of medicine on hand       REFERRAL TO PHARMACIST     Referral to the pharmacist: Not needed      [facility]     Shipping address confirmed in Epic.     Delivery Scheduled: Yes, Expected medication delivery date: 01/05/22.     Medication will be delivered via Same Day Courier to the prescription address in Epic [facility].    Unk Lightning   [facility] Specialty Technician

## 2022-01-05 MED FILL — TRIAMTERENE 37.5 MG-HYDROCHLOROTHIAZIDE 25 MG CAPSULE: ORAL | 30 days supply | Qty: 30 | Fill #7

## 2022-01-05 MED FILL — MYFORTIC 180 MG TABLET,DELAYED RELEASE: ORAL | 30 days supply | Qty: 120 | Fill #1

## 2022-01-05 MED FILL — ATORVASTATIN 40 MG TABLET: ORAL | 90 days supply | Qty: 90 | Fill #1

## 2022-01-05 MED FILL — TACROLIMUS 0.5 MG CAPSULE, IMMEDIATE-RELEASE: ORAL | 30 days supply | Qty: 150 | Fill #3

## 2022-01-07 ENCOUNTER — Ambulatory Visit: Admit: 2022-01-07 | Discharge: 2022-01-08 | Payer: MEDICARE

## 2022-01-07 DIAGNOSIS — I10 Essential (primary) hypertension: Principal | ICD-10-CM

## 2022-01-07 DIAGNOSIS — Z940 Kidney transplant status: Principal | ICD-10-CM

## 2022-01-07 MED ORDER — AMLODIPINE 5 MG TABLET
ORAL_TABLET | Freq: Every evening | ORAL | 0 refills | 185 days | Status: CP
Start: 2022-01-07 — End: 2022-07-11
  Filled 2022-02-02: qty 90, 90d supply, fill #0

## 2022-01-07 NOTE — Unmapped (Signed)
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 Lipid profile 2021 TG 110, LDL 58. HbA1C 5.0.  ?? Amlodipine 5mg  nightly  ?? Cozaar 50mg   ?? Triamterene-hydrochlorothiazide 37.5-25mg   ?? Aspirin 81mg   ?? Lipitor 40mg   ?? Not interested in consolidating to combination pill  ?? Reviewed ambulatory BP log which was at goal      CKDIIIb 2/2 GPA S/p Kidney Transplant 2014  Secondary Hyperparathyroidism  Baseline Cr. 1-1.2. No donor specific HLA Ab detected 2022. Labs 11/07/21 notable for Cr 1.34 (downtrending), normal urine protein/Cr ratio, no acidosis, no Ca/Ph abnormality, Mg 1.5, CMV viremia (viral load downtrending) and UCx with 10-100k E coli with no dysuria, increased frequency, or increased urgency.  ?? Myfortic 360mg  BID (confirmed with patient)  ?? Tacrolimus 1.5mg  daily and 1mg  nightly  ?? Renal US and CXR obtained today  ?? Did not treat asymptomatic bacteriuria  ?? Per uptodate for eGFR 39-59, recommend at least yearly Ca/Ph, PTH, and vitaminD for monitoring of secondary hyperparathyroidism.  ?? Gets routine lab-work q46m but now having transportation issues. Plan to obtain repeat labs at nephrology visit 02/25/22    Other Conditions  ?? Osteoporosis: fosamax 70mg  q7d, vitamin D3  ?? GERD: famotidine 20mg  BID as needed.   ?? Muscle strain: gel and heating pads as needed  ?? Grief: PHQ9 3. Brother passed away under questionable circumstances and friend recently lost boyfriend of ~20 years. Trazodone 50mg  nightly.  ?? Poor appetite: weight stable over the past few months. Eating one meal and some snacks throughout the day.    Healthcare Maintenance  [ ]  Foot exam, deferred until next visit  [ ]  Retinal eye exam, deferred until next visit  [ ]  HbA1C, deferred until next visit  [x]  PHQ9 3  [ ]  Dtap, not interested today  [ ]  Covid booster, not interested today  [ ]  Meningococcal vaccine, not interested today     1. Essential hypertension (RAF-HCC)    2. Kidney transplant recipient        Return in about 5 months (around 06/09/2022).    Staffed with Dr. Irena Cords, seen and discussed    __________________________________________________________    HPI:    [name] presents to clinic visit with her friend. She hurt her back after rearranging the furniture and lifting material. Her symptoms have improved after application of gel and heating pads, now barely noticeable. Her shoulder occasionally hurts after sleeping on her hand incorrectly.     Her appetite has not been the best, eating one meal and several snacks throughout the day. She has not continued to lose weight. She has had a poor appetite since her brother passed away in 10-16-2023, and his significant other at the time is under investigation. Her friend present at bedside lost her boyfriend of 19 years, and it has been stressful for Sophia Davis as well. She has had many stressors such as these weighing heavily on her mind. Since her friend drives her to appointments, she wonders if she has to coming for labwork or if it can be obtained at her upcoming appointments. She herself does not have a driver's license and does not like to drive.   __________________________________________________________    Medications:  Reviewed in EPIC  __________________________________________________________  Physical Exam:   Vital Signs:  Vitals:    01/07/22 0856   BP: 136/56   BP Site: L Arm   BP Position: Sitting   BP Cuff Size: Medium   Pulse: 63   Temp: 36.7 ??C (98.1 ??F)   TempSrc: Temporal   SpO2: 99%   Weight: 52 kg (114 lb 9.6 oz)   Height: 154.9 cm (5' 1)     Gen: Well appearing, NAD  CV: RRR, no murmurs, warm and well perfused, 3+ radial pulses bilaterally  Pulm: normal respiratory effort, CTAB, no crackles or wheezes  Abd: Soft, NTND, normal BS  Ext: No edema, diminished muscle tone    PHQ-9 Score:  PHQ-9 TOTAL SCORE: 3  GAD-7 Score:       Medication adherence and barriers to the treatment plan have been addressed. Opportunities to optimize healthy behaviors have been discussed. Patient / caregiver voiced understanding.

## 2022-01-07 NOTE — Unmapped (Deleted)
[facility] at [facility]     Reason for visit: Follow-up    Questions / Concerns that need to be addressed: NA    Hypertension:  ??? Have blood pressure cuff at home?: yes- arm cuff  ??? Regularly checking blood pressure?: yes  ??? If patient has a  record of recent blood pressures, please enter into flow sheets    PTHomeBP     Omron BPs (complete if screening BP has a systolic  > 129 or diastolic > 79)  BP#1 132/52  60   BP#2 138/59  65  BP#3 137/86  65    Average BP 136/56  63  (please note this as a comment in vitals)         Allergies reviewed: Yes    Medication reviewed: Yes  Pended refills? Yes, amlodipine and atorvastatin         HCDM reviewed and updated in Epic:    We are working to make sure all of our patients??? wishes are updated in Epic and part of that is documenting a Environmental health practitioner for each patient  A Health Care Decision Maker is someone you choose who can make health care decisions for you if you are not able - who would you most want to do this for you????  is already up to date.      HCDM (other legal document): Ellsworth Lennox - Son - [phone]    [facility] (patient stated preference): Lacey Jensen - Daughter - [phone]     BPAs completed:  PHQ9      COVID-19 Vaccine Summary  Which COVID-19 Vaccine was administered  Moderna  Type:  Complete  Dates Given:     Date last COVID Positive Test:   11/10/2020      Date last mAB infusion:  07/17/2020              Immunization History   Administered Date(s) Administered   ??? COVID-19 VACC,(JANSSEN)(PF) 01/11/2020   ??? COVID-19 VACCINE,MRNA(MODERNA)(PF) 10/02/2020   ??? INFLUENZA QUAD ADJUVANTED 4YR UP(FLUAD) 07/02/2021   ??? INFLUENZA TIV (TRI) PF (IM) 08/30/2006, 09/14/2007, 08/14/2009, 08/26/2011, 07/27/2012   ??? Influenza TRI (IIV3) 4+yrs PF 08/15/2013   ??? Influenza Vaccine Quad (IIV4 PF) 20mo+ injectable 07/04/2014, 07/23/2015, 10/06/2016, 07/27/2017, 06/28/2018, 07/12/2019, 10/02/2020   ??? PNEUMOCOCCAL POLYSACCHARIDE 23 06/22/2003, 08/12/2015   ??? Pneumococcal Conjugate 13-Valent 09/26/2013   ??? Pneumococcal Conjugate 20-valent 03/21/2021   ??? SHINGRIX-ZOSTER VACCINE (HZV), RECOMBINANT,SUB-UNIT,ADJUVANTED IM 01/12/2018, 06/17/2018       __________________________________________________________________________________________  Screening: Yes, SCREENINGS COMPLETED IN FLOWSHEETS    HARK Screening       AUDIT       PHQ2       PHQ9          P4 Suicidality Screener                GAD7       COPD Assessment       Falls Risk     Rooming completed by Coralyn Helling

## 2022-01-07 NOTE — Unmapped (Addendum)
It was a pleasure seeing you in clinic today!   I'm so sorry for your losses and all of the stress that you have been going through lately!  Your kidney labs looked good in January 2023, so okay to wait until your May appointment with nephrology to obtain [facility].  For your muscle strain, pleas continue to use the gel and heating pads as you have been using. Also okay to take Tylenol 1000mg  every 8 hours as needed.   Hiram Gash, MD

## 2022-01-11 NOTE — Unmapped (Signed)
I saw and evaluated the patient, participating in the key portions of the service.  I reviewed the resident’s note.  I agree with the resident’s findings and plan. Stephenie Navejas A Osamah Schmader, MD

## 2022-01-22 NOTE — Unmapped (Signed)
Spoke with Steward Drone on phone, she reports that she is doing well. Pt asked to get labs next week, she states that she will go sometime next week.   Labs orders updated and sent to [place].   Beuna aware of appt with Dr. Toni Arthurs in May.   No other needs at this time.

## 2022-01-26 ENCOUNTER — Other Ambulatory Visit
Admission: RE | Admit: 2022-01-26 | Discharge: 2022-01-26 | Disposition: A | Discharge status: Home / Self Care | Payer: Medicare Other | Source: Ambulatory Visit | Attending: Nephrology | Admitting: Nephrology

## 2022-01-26 DIAGNOSIS — T8610 Unspecified complication of kidney transplant: Secondary | ICD-10-CM | POA: Diagnosis not present

## 2022-01-26 DIAGNOSIS — B259 Cytomegaloviral disease, unspecified: Secondary | ICD-10-CM | POA: Insufficient documentation

## 2022-01-26 DIAGNOSIS — Z114 Encounter for screening for human immunodeficiency virus [HIV]: Secondary | ICD-10-CM | POA: Diagnosis present

## 2022-01-26 DIAGNOSIS — D631 Anemia in chronic kidney disease: Secondary | ICD-10-CM | POA: Diagnosis not present

## 2022-01-26 DIAGNOSIS — Z9483 Pancreas transplant status: Secondary | ICD-10-CM | POA: Diagnosis not present

## 2022-01-26 DIAGNOSIS — E1122 Type 2 diabetes mellitus with diabetic chronic kidney disease: Secondary | ICD-10-CM | POA: Diagnosis not present

## 2022-01-26 DIAGNOSIS — N189 Chronic kidney disease, unspecified: Secondary | ICD-10-CM | POA: Diagnosis not present

## 2022-01-26 DIAGNOSIS — D899 Disorder involving the immune mechanism, unspecified: Secondary | ICD-10-CM | POA: Insufficient documentation

## 2022-01-26 DIAGNOSIS — N390 Urinary tract infection, site not specified: Secondary | ICD-10-CM | POA: Insufficient documentation

## 2022-01-26 DIAGNOSIS — Y849 Medical procedure, unspecified as the cause of abnormal reaction of the patient, or of later complication, without mention of misadventure at the time of the procedure: Secondary | ICD-10-CM | POA: Diagnosis not present

## 2022-01-26 DIAGNOSIS — Z79899 Other long term (current) drug therapy: Secondary | ICD-10-CM | POA: Insufficient documentation

## 2022-01-26 LAB — CBC WITH DIFFERENTIAL/PLATELET
Abs Immature Granulocytes: 0.01 10*3/uL (ref 0.00–0.07)
Basophils Absolute: 0 10*3/uL (ref 0.0–0.1)
Basophils Relative: 1 %
Eosinophils Absolute: 0.1 10*3/uL (ref 0.0–0.5)
Eosinophils Relative: 2 %
HCT: 34 % — ABNORMAL LOW (ref 36.0–46.0)
Hemoglobin: 10.8 g/dL — ABNORMAL LOW (ref 12.0–15.0)
Immature Granulocytes: 0 %
Lymphocytes Relative: 27 %
Lymphs Abs: 1 10*3/uL (ref 0.7–4.0)
MCH: 26.5 pg (ref 26.0–34.0)
MCHC: 31.8 g/dL (ref 30.0–36.0)
MCV: 83.3 fL (ref 80.0–100.0)
Monocytes Absolute: 0.6 10*3/uL (ref 0.1–1.0)
Monocytes Relative: 15 %
Neutro Abs: 2 10*3/uL (ref 1.7–7.7)
Neutrophils Relative %: 55 %
Platelets: 200 10*3/uL (ref 150–400)
RBC: 4.08 MIL/uL (ref 3.87–5.11)
RDW: 13.8 % (ref 11.5–15.5)
WBC: 3.8 10*3/uL — ABNORMAL LOW (ref 4.0–10.5)
nRBC: 0 % (ref 0.0–0.2)

## 2022-01-26 LAB — BASIC METABOLIC PANEL
Anion gap: 9 (ref 5–15)
BUN: 27 mg/dL — ABNORMAL HIGH (ref 8–23)
CO2: 23 mmol/L (ref 22–32)
Calcium: 9.3 mg/dL (ref 8.9–10.3)
Chloride: 100 mmol/L (ref 98–111)
Creatinine, Ser: 1.57 mg/dL — ABNORMAL HIGH (ref 0.44–1.00)
GFR, Estimated: 34 mL/min — ABNORMAL LOW (ref >60)
Glucose, Bld: 103 mg/dL — ABNORMAL HIGH (ref 70–99)
Potassium: 3.8 mmol/L (ref 3.5–5.1)
Sodium: 132 mmol/L — ABNORMAL LOW (ref 135–145)

## 2022-01-26 LAB — MAGNESIUM: Magnesium: 1.5 mg/dL — ABNORMAL LOW (ref 1.7–2.4)

## 2022-01-26 LAB — PHOSPHORUS: Phosphorus: 3.7 mg/dL (ref 2.5–4.6)

## 2022-01-26 NOTE — Unmapped (Signed)
[facility] Refill Coordination Note    Specialty Medication(s) to be Shipped:   Transplant: Myfortic 180mg  and tacrolimus 0.5mg     Other medication(s) to be shipped: triamterene/hctz and amlodipine     Sophia Davis, DOB: [birth year]  Phone: [phone] (home)       All above HIPAA information was verified with patient.     Was a Nurse, learning disability used for this call? No    Completed refill call assessment today to schedule patient's medication shipment from the [facility] [phone]).  All relevant notes have been reviewed.     Specialty medication(s) and dose(s) confirmed: Regimen is correct and unchanged.   Changes to medications: Sophia Davis reports no changes at this time.  Changes to insurance: No  New side effects reported not previously addressed with a pharmacist or physician: None reported  Questions for the pharmacist: No    Confirmed patient received a Conservation officer, historic buildings and a Surveyor, mining with first shipment. The patient will receive a drug information handout for each medication shipped and additional FDA Medication Guides as required.       DISEASE/MEDICATION-SPECIFIC INFORMATION        N/A    SPECIALTY MEDICATION ADHERENCE     Medication Adherence    Patient reported X missed doses in the last month: 0  Specialty Medication: Myfortic 180mg   Patient is on additional specialty medications: Yes  Additional Specialty Medications: Tacrolimus 0.5mg   Patient Reported Additional Medication X Missed Doses in the Last Month: 0  Patient is on more than two specialty medications: No  Adherence tools used: patient uses a pill box to manage medications       Were doses missed due to medication being on hold? No    Myfortic 180 mg: 9 days of medicine on hand   Tacrolimus 0.5 mg: 9 days of medicine on hand     REFERRAL TO PHARMACIST     Referral to the pharmacist: Not needed      [facility]     Shipping address confirmed in Epic.     Delivery Scheduled: Yes, Expected medication delivery date: 02/02/2022.     Medication will be delivered via Same Day Courier to the prescription address in Epic WAM.    Sophia Davis   [facility] Specialty Technician

## 2022-01-27 LAB — TACROLIMUS LEVEL: Tacrolimus (FK506) - LabCorp: 7.6 ng/mL (ref 2.0–20.0)

## 2022-01-27 LAB — BASIC METABOLIC PANEL
ANION GAP: 9 (ref 5–15)
BLOOD UREA NITROGEN: 27 mg/dL — ABNORMAL HIGH (ref 8–23)
CALCIUM: 9.3 mg/dL (ref 8.9–10.3)
CHLORIDE: 100 mmol/L (ref 98–111)
CO2: 23 mmol/L (ref 22–32)
CREATININE: 1.57 mg/dL — ABNORMAL HIGH (ref 0.44–1.00)
EGFR CKD-EPI NON-AA MALE: 34 mL/min — ABNORMAL LOW
GLUCOSE RANDOM: 103 mg/dL — ABNORMAL HIGH (ref 70–99)
MAGNESIUM: 1.5 mg/dL — ABNORMAL LOW (ref 1.7–2.4)
PHOSPHORUS: 3.7 mg/dL (ref 2.5–4.6)
POTASSIUM: 3.8 mmol/L (ref 3.5–5.1)
SODIUM: 132 mmol/L — ABNORMAL LOW (ref 135–145)

## 2022-01-27 LAB — CBC W/ DIFFERENTIAL
BASOPHILS ABSOLUTE COUNT: 0 10*3/uL (ref 0.0–0.1)
BASOPHILS RELATIVE PERCENT: 1 %
EOSINOPHILS ABSOLUTE COUNT: 0.1 10*3/uL (ref 0.0–0.5)
EOSINOPHILS RELATIVE PERCENT: 2 %
HEMATOCRIT: 34 — ABNORMAL LOW (ref 36.0–46.0)
HEMOGLOBIN: 10.8 g/dL — ABNORMAL LOW (ref 12.0–15.0)
IMMATURE CELLS: 0 %
LYMPHOCYTES ABSOLUTE COUNT: 1 10*3/uL (ref 0.7–4.0)
LYMPHOCYTES RELATIVE PERCENT: 27 %
MEAN CORPUSCULAR HEMOGLOBIN CONC: 31.8 g/dL (ref 30.0–36.0)
MEAN CORPUSCULAR HEMOGLOBIN: 26.5 pg (ref 26.0–34.0)
MEAN CORPUSCULAR VOLUME: 83.3 fL (ref 80.0–100.0)
MONOCYTES ABSOLUTE COUNT: 0.6 10*3/uL (ref 0.1–1.0)
MONOCYTES RELATIVE PERCENT: 15 %
NEUTROPHILS RELATIVE PERCENT: 55 %
NUCLEATED RED BLOOD CELLS: 0 % (ref 0.0–0.2)
PLATELET COUNT: 200 10*3/uL (ref 150–400)
RED CELL DISTRIBUTION WIDTH: 13.8 % (ref 11.5–15.5)
WHITE BLOOD CELL COUNT: 3.8 10*3/uL — ABNORMAL LOW (ref 4.0–10.5)

## 2022-01-28 LAB — CMV DNA, QUANTITATIVE, PCR
CMV DNA Quant: POSITIVE IU/mL
Log10 CMV Qn DNA Pl: UNDETERMINED log10 IU/mL

## 2022-02-02 MED FILL — MYFORTIC 180 MG TABLET,DELAYED RELEASE: ORAL | 30 days supply | Qty: 120 | Fill #2

## 2022-02-02 MED FILL — TACROLIMUS 0.5 MG CAPSULE, IMMEDIATE-RELEASE: ORAL | 30 days supply | Qty: 150 | Fill #4

## 2022-02-02 MED FILL — TRIAMTERENE 37.5 MG-HYDROCHLOROTHIAZIDE 25 MG CAPSULE: ORAL | 30 days supply | Qty: 30 | Fill #8

## 2022-02-10 NOTE — Unmapped (Signed)
Called pt to let her know order for Omnigraf was placed per Dr. Toni Arthurs.   No answer, VM left for pt to call [facility] back

## 2022-02-10 NOTE — Unmapped (Signed)
Spoke with Sophia Davis, she was notified about Sophia Davis order and that they will call her to schedule a time. She was informed that they will draw her regular labs at the same time and to make sure she does not take her Prograf the morning of lab draw so we can get accurate level. She states understanding.  Sophia Davis does report that she has not been drinking as much water as she should and she will increase her water intake.   No other needs at this time

## 2022-02-18 DIAGNOSIS — E559 Vitamin D deficiency, unspecified: Principal | ICD-10-CM

## 2022-02-18 DIAGNOSIS — Z940 Kidney transplant status: Principal | ICD-10-CM

## 2022-02-18 DIAGNOSIS — Z79899 Other long term (current) drug therapy: Principal | ICD-10-CM

## 2022-02-23 NOTE — Unmapped (Signed)
[facility] Refill Coordination Note    Specialty Medication(s) to be Shipped:   Transplant: Myfortic 180mg  and tacrolimus 0.5mg     Other medication(s) to be shipped: alendronate, losartan, trazodone, and dyazide     Sophia Davis, DOB: Mar 15, 1946  Phone: [phone] (home)       All above HIPAA information was verified with patient.     Was a Nurse, learning disability used for this call? No    Completed refill call assessment today to schedule patient's medication shipment from the [facility] [phone]).  All relevant notes have been reviewed.     Specialty medication(s) and dose(s) confirmed: Regimen is correct and unchanged.   Changes to medications: Elisabeth reports no changes at this time.  Changes to insurance: No  New side effects reported not previously addressed with a pharmacist or physician: None reported  Questions for the pharmacist: No    Confirmed patient received a Conservation officer, historic buildings and a Surveyor, mining with first shipment. The patient will receive a drug information handout for each medication shipped and additional FDA Medication Guides as required.       DISEASE/MEDICATION-SPECIFIC INFORMATION        N/A    SPECIALTY MEDICATION ADHERENCE     Medication Adherence    Patient reported X missed doses in the last month: 0  Specialty Medication: Myfortic 180mg   Patient is on additional specialty medications: Yes  Additional Specialty Medications: Tacrolimus 0.5mg   Patient Reported Additional Medication X Missed Doses in the Last Month: 0  Patient is on more than two specialty medications: No  Adherence tools used: patient uses a pill box to manage medications        Were doses missed due to medication being on hold? No    Myfortic 180 mg: 9 days of medicine on hand   Tacrolimus 0.5 mg: 9 days of medicine on hand     REFERRAL TO PHARMACIST     Referral to the pharmacist: Not needed      [facility]     Shipping address confirmed in Epic.     Delivery Scheduled: Yes, Expected medication delivery date: 02/27/2022.     Medication will be delivered via Next Day Courier to the prescription address in Epic WAM.    [name]   [facility] Specialty Technician

## 2022-02-25 ENCOUNTER — Ambulatory Visit: Admit: 2022-02-25 | Discharge: 2022-02-26 | Payer: MEDICARE

## 2022-02-25 ENCOUNTER — Ambulatory Visit: Admit: 2022-02-25 | Discharge: 2022-02-26 | Payer: MEDICARE | Attending: Nephrology | Primary: Nephrology

## 2022-02-25 DIAGNOSIS — Z940 Kidney transplant status: Principal | ICD-10-CM

## 2022-02-25 DIAGNOSIS — Z79899 Other long term (current) drug therapy: Principal | ICD-10-CM

## 2022-02-25 LAB — HEPATIC FUNCTION PANEL
ALBUMIN: 4.3 g/dL (ref 3.4–5.0)
ALKALINE PHOSPHATASE: 71 U/L (ref 46–116)
ALT (SGPT): 11 U/L (ref 10–49)
AST (SGOT): 16 U/L (ref <=34)
BILIRUBIN DIRECT: 0.4 mg/dL — ABNORMAL HIGH (ref 0.00–0.30)
BILIRUBIN TOTAL: 1 mg/dL (ref 0.3–1.2)
PROTEIN TOTAL: 6.8 g/dL (ref 5.7–8.2)

## 2022-02-25 LAB — URINALYSIS WITH MICROSCOPY
BILIRUBIN UA: NEGATIVE
BLOOD UA: NEGATIVE
GLUCOSE UA: NEGATIVE
KETONES UA: NEGATIVE
NITRITE UA: NEGATIVE
PH UA: 5.5 (ref 5.0–9.0)
PROTEIN UA: NEGATIVE
RBC UA: <1 /HPF (ref <4)
SPECIFIC GRAVITY UA: 1.01 (ref 1.005–1.030)
SQUAMOUS EPITHELIAL: 7 /HPF — ABNORMAL HIGH (ref 0–5)
TRANSITIONAL EPITHELIAL: 3 /HPF — ABNORMAL HIGH (ref 0–2)
UROBILINOGEN UA: 0.2
WBC UA: 78 /HPF — ABNORMAL HIGH (ref 0–5)

## 2022-02-25 LAB — BASIC METABOLIC PANEL
ANION GAP: 8 mmol/L (ref 5–14)
BLOOD UREA NITROGEN: 20 mg/dL (ref 9–23)
BUN / CREAT RATIO: 14
CALCIUM: 10 mg/dL (ref 8.7–10.4)
CHLORIDE: 96 mmol/L — ABNORMAL LOW (ref 98–107)
CO2: 26.3 mmol/L (ref 20.0–31.0)
CREATININE: 1.47 mg/dL — ABNORMAL HIGH
EGFR CKD-EPI (2021) FEMALE: 37 mL/min/{1.73_m2} — ABNORMAL LOW (ref >=60)
GLUCOSE RANDOM: 103 mg/dL — ABNORMAL HIGH (ref 70–99)
POTASSIUM: 3.9 mmol/L (ref 3.4–4.8)
SODIUM: 130 mmol/L — ABNORMAL LOW (ref 135–145)

## 2022-02-25 LAB — HEMOGLOBIN A1C
ESTIMATED AVERAGE GLUCOSE: 103 mg/dL
HEMOGLOBIN A1C: 5.2 % (ref 4.8–5.6)

## 2022-02-25 LAB — CBC W/ AUTO DIFF
BASOPHILS ABSOLUTE COUNT: 0.1 10*9/L (ref 0.0–0.1)
BASOPHILS RELATIVE PERCENT: 1 %
EOSINOPHILS ABSOLUTE COUNT: 0.1 10*9/L (ref 0.0–0.5)
EOSINOPHILS RELATIVE PERCENT: 1.7 %
HEMATOCRIT: 34.4 % (ref 34.0–44.0)
HEMOGLOBIN: 11.4 g/dL (ref 11.3–14.9)
LYMPHOCYTES ABSOLUTE COUNT: 1 10*9/L — ABNORMAL LOW (ref 1.1–3.6)
LYMPHOCYTES RELATIVE PERCENT: 18.9 %
MEAN CORPUSCULAR HEMOGLOBIN CONC: 33.2 g/dL (ref 32.0–36.0)
MEAN CORPUSCULAR HEMOGLOBIN: 26.9 pg (ref 25.9–32.4)
MEAN CORPUSCULAR VOLUME: 81.1 fL (ref 77.6–95.7)
MEAN PLATELET VOLUME: 7.1 fL (ref 6.8–10.7)
MONOCYTES ABSOLUTE COUNT: 0.5 10*9/L (ref 0.3–0.8)
MONOCYTES RELATIVE PERCENT: 10.2 %
NEUTROPHILS ABSOLUTE COUNT: 3.5 10*9/L (ref 1.8–7.8)
NEUTROPHILS RELATIVE PERCENT: 68.2 %
NUCLEATED RED BLOOD CELLS: 0 /100{WBCs} (ref <=4)
PLATELET COUNT: 305 10*9/L (ref 150–450)
RED BLOOD CELL COUNT: 4.24 10*12/L (ref 3.95–5.13)
RED CELL DISTRIBUTION WIDTH: 14.5 % (ref 12.2–15.2)
WBC ADJUSTED: 5.2 10*9/L (ref 3.6–11.2)

## 2022-02-25 LAB — PROTEIN / CREATININE RATIO, URINE
CREATININE, URINE: 100.8 mg/dL
PROTEIN URINE: 19.7 mg/dL
PROTEIN/CREAT RATIO, URINE: 0.195

## 2022-02-25 LAB — LIPID PANEL
CHOLESTEROL/HDL RATIO SCREEN: 2.6 (ref 1.0–4.5)
CHOLESTEROL: 121 mg/dL (ref <=200)
HDL CHOLESTEROL: 47 mg/dL (ref 40–60)
LDL CHOLESTEROL CALCULATED: 59 mg/dL (ref 40–99)
NON-HDL CHOLESTEROL: 74 mg/dL (ref 70–130)
TRIGLYCERIDES: 77 mg/dL (ref 0–150)
VLDL CHOLESTEROL CAL: 15.4 mg/dL (ref 11–41)

## 2022-02-25 LAB — PHOSPHORUS: PHOSPHORUS: 4 mg/dL (ref 2.4–5.1)

## 2022-02-25 LAB — MAGNESIUM: MAGNESIUM: 1.5 mg/dL — ABNORMAL LOW (ref 1.6–2.6)

## 2022-02-25 LAB — TACROLIMUS LEVEL, TROUGH: TACROLIMUS, TROUGH: 5.3 ng/mL (ref 5.0–15.0)

## 2022-02-25 LAB — PARATHYROID HORMONE (PTH): PARATHYROID HORMONE INTACT: 54.6 pg/mL (ref 18.4–80.1)

## 2022-02-25 NOTE — Unmapped (Signed)
Transplant Coordinator, Clinic Visit   Pt seen today by transplant nephrology for follow up, reviewed medications and symptoms.   Met with pt during clinic visit. Pt reports that she is getting Trugraf drawn on this Friday am at 0730. Pt reports that she is going to [place] on June 5 for 3 weeks.     Assessment  BP: 141/61 today in clinic. Pt reports home BP's 119-120/50's  Lightheaded: denies  Headache: denies  Hand tremors: denies  Numbness/tingling: denies  Fevers: denies  Chills/sweats: denies  Shortness of breath: denies  Chest pain or pressure: denies  Palpitations: denies  Abdominal pain: denies  Heart burn: denies  Nausea/vomiting: denies  Diarrhea/constipation: denies  UTI symptoms: denies  Swelling: denies    decreased appetite; reports adequate hydration.     Any new medications? denies    Immunization status: up to date, declines covid vaccine    Functional Score: 100   Normal no complaints; no evidence of  disease.     I spent a total of 20 minutes with Steward Drone reviewing medications and symptoms.

## 2022-02-25 NOTE — Unmapped (Signed)
AOBP:  LEFT  arm  MEDIUM  cuff   Average:146/62  Pulse:68  1st reading: 152/64 Pulse:65  2nd reading: 145/61 Pulse:69  3rd reading: 141/61 Pulse:69

## 2022-02-26 LAB — CMV DNA, QUANTITATIVE, PCR
CMV QUANT LOG10: 2.08 {Log_IU}/mL — ABNORMAL HIGH (ref <0.00)
CMV QUANT: 120 [IU]/mL — ABNORMAL HIGH (ref <0)
CMV VIRAL LD: DETECTED — AB

## 2022-02-26 LAB — BK VIRUS QUANTITATIVE PCR, BLOOD: BK BLOOD RESULT: NOT DETECTED

## 2022-02-26 MED FILL — TRAZODONE 50 MG TABLET: ORAL | 90 days supply | Qty: 90 | Fill #1

## 2022-02-26 MED FILL — ALENDRONATE 70 MG TABLET: ORAL | 84 days supply | Qty: 12 | Fill #1

## 2022-02-26 MED FILL — TRIAMTERENE 37.5 MG-HYDROCHLOROTHIAZIDE 25 MG CAPSULE: ORAL | 30 days supply | Qty: 30 | Fill #9

## 2022-02-26 MED FILL — LOSARTAN 50 MG TABLET: ORAL | 90 days supply | Qty: 180 | Fill #4

## 2022-02-26 MED FILL — MYFORTIC 180 MG TABLET,DELAYED RELEASE: ORAL | 30 days supply | Qty: 120 | Fill #3

## 2022-02-26 MED FILL — TACROLIMUS 0.5 MG CAPSULE, IMMEDIATE-RELEASE: ORAL | 30 days supply | Qty: 150 | Fill #5

## 2022-03-02 LAB — VITAMIN D 25 HYDROXY: VITAMIN D, TOTAL (25OH): 36.5 ng/mL (ref 20.0–80.0)

## 2022-03-04 LAB — VITAMIN D 1,25 DIHYDROXY: VITAMIN D 1,25-DIHYDROXY: 22 pg/mL

## 2022-03-05 LAB — HLA DS POST TRANSPLANT
ANTI-DONOR DRW #1 MFI: 630 MFI
ANTI-DONOR DRW #2 MFI: 75 MFI
ANTI-DONOR HLA-A #1 MFI: 0 MFI
ANTI-DONOR HLA-B #1 MFI: 0 MFI
ANTI-DONOR HLA-B #2 MFI: 25 MFI
ANTI-DONOR HLA-C #1 MFI: 0 MFI
ANTI-DONOR HLA-C #2 MFI: 0 MFI
ANTI-DONOR HLA-DQB #1 MFI: 1 MFI
ANTI-DONOR HLA-DQB #2 MFI: 27 MFI
ANTI-DONOR HLA-DR #1 MFI: 10 MFI
ANTI-DONOR HLA-DR #2 MFI: 25 MFI

## 2022-03-05 LAB — FSAB CLASS 2 ANTIBODY SPECIFICITY
CLASS 2 ANTIBODIES IDENTIFIED: 1:01 {titer}
HLA CL2 AB RESULT: POSITIVE

## 2022-03-05 LAB — FSAB CLASS 1 ANTIBODY SPECIFICITY: HLA CLASS 1 ANTIBODY RESULT: POSITIVE

## 2022-03-05 MED ORDER — FAMOTIDINE 20 MG TABLET
ORAL_TABLET | Freq: Two times a day (BID) | ORAL | 3 refills | 90 days | Status: CP | PRN
Start: 2022-03-05 — End: 2023-03-05

## 2022-03-05 NOTE — Unmapped (Signed)
TrueGraf Lab Report

## 2022-03-05 NOTE — Unmapped (Signed)
The patient is requesting a medication refill

## 2022-03-06 NOTE — Unmapped (Signed)
[facility] transplant nephrology clinic visit    assessment and plan  1. s/p kidney transplant 05/17/13. baseline creatinine 1.2-1.4 mg/dl. stage iiib chronic kidney disease. no proteinuria. no donor specific hla ab detected '23. trugraf kidney biomarker surveillance to be scheduled.  2. immunosuppression. mycophenolate 360mg  bid. tacrolimus 12hr lvl 4-7 ng/ml.  3. hypertension. blood pressure goal < 130/80 mmhg.  4. cmv viremia. asymptomatic. cmv pcr < 200.  5. health maintenance. influenza '21. pcv13 pneumococcal '20 administered. zoster '19. covid-19 janssen/moderna vaccine '21. mammogram '21. gyn exam '13/post hysterectomy. cystoscopy '14. colonoscopy '20. dermatology '21. kidney ultrasound '22.    history of present illness    [name] is a 76 year old woman seen in follow up post kidney transplant 05/17/2013. she feels well. appetite fair. physical exercise tolerance good. no fevers chills or sweats. no chest pain cough or shortness of breath. no lower extremity edema. no abdominal pain no n/v/d. no muscle pain or cramping. no dysuria hematuria or difficulty voiding. blood pressure 120s/60s. all other systems reviewed and negative x10 systems.    past medical hx:   1. s/p deceased donor kidney transplant 05/17/2013. mpo-anca+ chronic gn. alemtuzumab induction. baseline creatinine 1.2-1.4 mg/dl.  2. mpo-anca+ vasculitis/granulomatosis with polyangiitis. hx pulmonary hemorrhage. trtmt plasma exchange, iv methylprednisolone, iv/po cyclophosphamide x total 76m '03-'07; rituximab 1gm x2 '07.  3. hypertension   4. coronary artery disease s/p pci/stent '97. nl cards stress '13: lvef > 65%. no myocardial ischemia   5. hyperlipidemia  6. osteoporosis  7. secondary/tertiary hyperparathyroidism    past surgical hx: total abdominal hysterectomy '98. 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 kidney transplant '14. right hand 3rd/4th trigger finger release '16.    allergies: nkda     medications: tacrolimus 1.5mg /1mg  am/pm, mycophenolate sodium 360mg  bid, losartan 50mg  bid, amlodipine 2.5mg  q.pm, triamterene/hydrochlorothiazide 37.5mg /25mg  daily, atorvastatin 40mg  daily, aspirin 81mg  daily, vitamin d 1,000u daily, famotidine 20mg  prn, alendronate 70mg  q.week.    soc hx: widowed x3 children. distant smoking hx; none > 40 yrs.    physical exam: t96.1 p57 bp149/55 wt51.3kg bmi21.3. wd/wn woman appropriate affect and mood. nl sclera anicteric. mmm no thrush. neck supple no palpable ln. heart rrr nl s1s2 no m/r/g. lungs clear bilateral. abdomen soft nt/nd. no lower ext edema. msk no synovitis or tophi. skin no rash. neuro alert oriented non focal exam.

## 2022-03-12 NOTE — Unmapped (Signed)
Trugraf results from 02/27/22 are 0.26.  Dr. Toni Arthurs made aware

## 2022-03-13 MED ORDER — CHOLECALCIFEROL (VITAMIN D3) 25 MCG (1,000 UNIT) TABLET
ORAL_TABLET | Freq: Every day | ORAL | 3 refills | 90 days | Status: CP
Start: 2022-03-13 — End: 2023-03-13
  Filled 2022-03-17: qty 90, 90d supply, fill #0

## 2022-03-13 MED ORDER — ATORVASTATIN 40 MG TABLET
ORAL_TABLET | Freq: Every evening | ORAL | 3 refills | 90 days | Status: CP
Start: 2022-03-13 — End: 2023-03-13
  Filled 2022-03-17: qty 90, 90d supply, fill #0

## 2022-03-13 NOTE — Unmapped (Signed)
[facility] Pharmacy Refill Coordination Note    Pt going to [place], [place] for a month June 2nd- July 2nd    Specialty Medication(s) to be Shipped:   Transplant: Myfortic 180mg  and tacrolimus 0.5mg     Other medication(s) to be shipped:  dyazide, Vitamin D and Atorvastatin     Sophia Davis, DOB: [birth year]  Phone: [phone] (home)       All above HIPAA information was verified with patient.     Was a Nurse, learning disability used for this call? No    Completed refill call assessment today to schedule patient's medication shipment from the [facility] [phone]).  All relevant notes have been reviewed.     Specialty medication(s) and dose(s) confirmed: Regimen is correct and unchanged.   Changes to medications: Adreana reports no changes at this time.  Changes to insurance: No  New side effects reported not previously addressed with a pharmacist or physician: None reported  Questions for the pharmacist: No    Confirmed patient received a Conservation officer, historic buildings and a Surveyor, mining with first shipment. The patient will receive a drug information handout for each medication shipped and additional FDA Medication Guides as required.       DISEASE/MEDICATION-SPECIFIC INFORMATION        N/A    SPECIALTY MEDICATION ADHERENCE     Medication Adherence    Patient reported X missed doses in the last month: 0  Specialty Medication: Myfortic 180mg   Patient is on additional specialty medications: Yes  Additional Specialty Medications: Tacrolimus 0.5mg   Patient Reported Additional Medication X Missed Doses in the Last Month: 0  Patient is on more than two specialty medications: No  Informant: patient  Adherence tools used: patient uses a pill box to manage medications        Were doses missed due to medication being on hold? No    Myfortic 180 mg: 14 days of medicine on hand   Tacrolimus 0.5 mg: 14 days of medicine on hand     REFERRAL TO PHARMACIST     Referral to the pharmacist: Not needed      [facility]     Shipping address confirmed in Epic.     Delivery Scheduled: Yes, Expected medication delivery date: 03/18/22.  However, Rx request for refills was sent to the provider as there are none remaining.     Medication will be delivered via Next Day Courier to the prescription address in Epic [place].    Wyatt Mage M Elisabeth Cara   [facility] Specialty Technician

## 2022-03-13 NOTE — Unmapped (Signed)
The patient is requesting a medication refill

## 2022-03-17 MED FILL — TRIAMTERENE 37.5 MG-HYDROCHLOROTHIAZIDE 25 MG CAPSULE: ORAL | 30 days supply | Qty: 30 | Fill #10

## 2022-03-17 MED FILL — TACROLIMUS 0.5 MG CAPSULE, IMMEDIATE-RELEASE: ORAL | 30 days supply | Qty: 150 | Fill #6

## 2022-03-17 MED FILL — MYFORTIC 180 MG TABLET,DELAYED RELEASE: ORAL | 30 days supply | Qty: 120 | Fill #4

## 2022-03-18 NOTE — Unmapped (Signed)
Sophia Davis 's entire shipment will be delayed as a result of Courier returned package      I have reached out to the patient  at [phone] and communicated the delivery change. We will reschedule the medication for the delivery date that the patient agreed upon.  We have confirmed the delivery date as 06/01, via next day courier.

## 2022-04-20 NOTE — Unmapped (Signed)
[facility] Pharm[facility] Clinical Assessment & Refill Coordination Note    Sophia Davis, DOB: 11-18-1945  Phone: [phone] (home)     All above HIPAA information was verified with patient.     Was a Nurse, learning disability used for this call? No    Specialty Medication(s):   Transplant: Myfortic 180mg  and tacrolimus 0.5mg      Current Outpatient Medications   Medication Sig Dispense Refill    alendronate (FOSAMAX) 70 MG tablet Take 1 tablet (70 mg total) by mouth every seven (7) days. 12 tablet 2    amLODIPine (NORVASC) 5 MG tablet Take 1 tablet (5 mg total) by mouth nightly. 180 tablet 0    aspirin 81 MG chewable tablet Chew 1 tablet (81 mg total) daily.      atorvastatin (LIPITOR) 40 MG tablet Take 1 tablet by mouth every evening 90 tablet 3    cholecalciferol, vitamin D3 25 mcg, 1,000 units,, 1,000 unit (25 mcg) tablet Take 1 tablet (25 mcg total) by mouth daily. 90 tablet 3    famotidine (PEPCID) 20 MG tablet TAKE 1 TABLET (20 MG TOTAL) BY MOUTH TWO (2) TIMES A DAY AS NEEDED FOR HEARTBURN. 180 tablet 3    losartan (COZAAR) 50 MG tablet Take 1 tablet (50 mg total) by mouth two (2) times a day. 180 tablet PRN    MYFORTIC 180 mg EC tablet Take 2 tablets (360 mg total) by mouth two (2) times a day. 360 tablet 3    tacrolimus (PROGRAF) 0.5 MG capsule Take 3 capsules (1.5 mg total) by mouth in the morning AND 2 capsules (1 mg total) in the evening. 150 capsule 11    traZODone (DESYREL) 50 MG tablet Take 1 tablet (50 mg total) by mouth nightly. 90 tablet 3    triamterene-hydrochlorothiazide (DYAZIDE) 37.5-25 mg per capsule Take 1 capsule by mouth every morning. 30 capsule 11     No current facility-administered medications for this visit.        Changes to medications: Sophia Davis reports no changes at this time.    No Known Allergies    Changes to allergies: No    SPECIALTY MEDICATION ADHERENCE     Myfortic 180 mg: 10 days of medicine on hand   Tacrolimus 0.5 mg: 10 days of medicine on hand     Medication Adherence    Patient reported X missed doses in the last month: 0  Specialty Medication: Myfortic 180mg   Patient is on additional specialty medications: Yes  Additional Specialty Medications: Tacrolimus 0.5mg   Patient Reported Additional Medication X Missed Doses in the Last Month: 0  Patient is on more than two specialty medications: No  Adherence tools used: patient uses a pill box to manage medications          Specialty medication(s) dose(s) confirmed: Regimen is correct and unchanged.     Are there any concerns with adherence? No    Adherence counseling provided? Not needed    CLINICAL MANAGEMENT AND INTERVENTION      Clinical Benefit Assessment:    Do you feel the medicine is effective or helping your condition? Yes    Clinical Benefit counseling provided? Not needed    Adverse Effects Assessment:    Are you experiencing any side effects? No    Are you experiencing difficulty administering your medicine? No    Quality of Life Assessment:           How many days over the past month did your kidney transplant  keep you from your normal activities? For example, brushing your teeth or getting up in the morning. 0    Have you discussed this with your provider? Not needed    Acute Infection Status:    Acute infections noted within Epic:  No active infections  Patient reported infection: None    Therapy Appropriateness:    Is therapy appropriate and patient progressing towards therapeutic goals? Yes, therapy is appropriate and should be continued    DISEASE/MEDICATION-SPECIFIC INFORMATION      N/A    PATIENT SPECIFIC NEEDS     Does the patient have any physical, cognitive, or cultural barriers? No    Is the patient high risk? Yes, patient is taking a REMS drug. Medication is dispensed in compliance with REMS program    Does the patient require a Care Management Plan? No     SOCIAL DETERMINANTS OF HEALTH     At the [facility], we have learned that life circumstances - like trouble affording food, housing, utilities, or transportation can affect the health of many of our patients.   That is why we wanted to ask: are you currently experiencing any life circumstances that are negatively impacting your health and/or quality of life? No    Social Determinants of Psychologist, prison and probation services Strain: Not on file   Internet Connectivity: Not on file   Food Insecurity: Not on file   Tobacco Use: Low Risk     Smoking Tobacco Use: Never    Smokeless Tobacco Use: Never    Passive Exposure: Not on file   Housing/Utilities: Not on file   Alcohol Use: Not At Risk    How often do you have a drink containing alcohol?: Never    How many drinks containing alcohol do you have on a typical day when you are drinking?: 1 - 2    How often do you have 5 or more drinks on one occasion?: Never   Transportation Needs: Not on file   Substance Use: Not on file   Health Literacy: Not on file   Physical Activity: Not on file   Interpersonal Safety: Not on file   Stress: Not on file   Intimate Partner Violence: Not on file   Depression: Not on file   Social Connections: Not on file       Would you be willing to receive help with any of the needs that you have identified today? Not applicable       SHIPPING     Specialty Medication(s) to be Shipped:   Transplant: Myfortic 180mg  and tacrolimus 0.5mg     Other medication(s) to be shipped:  triamterene/hctz     Changes to insurance: No    Delivery Scheduled: Yes, Expected medication delivery date: 04/27/22.     Medication will be delivered via Same Day Courier to the confirmed prescription address in [facility].    The patient will receive a drug information handout for each medication shipped and additional FDA Medication Guides as required.  Verified that patient has previously received a Conservation officer, historic buildings and a Surveyor, mining.    The patient or caregiver noted above participated in the development of this care plan and knows that they can request review of or adjustments to the care plan at any time.      All of the patient's questions and concerns have been addressed.    [name]   [facility] Specialty Pharmacist

## 2022-04-27 MED FILL — MYFORTIC 180 MG TABLET,DELAYED RELEASE: ORAL | 30 days supply | Qty: 120 | Fill #5

## 2022-04-27 MED FILL — TRIAMTERENE 37.5 MG-HYDROCHLOROTHIAZIDE 25 MG CAPSULE: ORAL | 30 days supply | Qty: 30 | Fill #11

## 2022-04-27 MED FILL — TACROLIMUS 0.5 MG CAPSULE, IMMEDIATE-RELEASE: ORAL | 30 days supply | Qty: 150 | Fill #7

## 2022-05-19 MED ORDER — TRIAMTERENE 37.5 MG-HYDROCHLOROTHIAZIDE 25 MG CAPSULE
ORAL_CAPSULE | Freq: Every morning | ORAL | 11 refills | 30 days
Start: 2022-05-19 — End: 2023-05-19

## 2022-05-19 MED ORDER — LOSARTAN 50 MG TABLET
ORAL_TABLET | Freq: Two times a day (BID) | ORAL | 2 refills | 90 days | Status: CP
Start: 2022-05-19 — End: 2023-03-10
  Filled 2022-05-26: qty 180, 90d supply, fill #0

## 2022-05-19 NOTE — Unmapped (Signed)
[facility] Pharm[facility] Refill Coordination Note    Specialty Medication(s) to be Shipped:   Transplant:  mycophenolic acid 180mg  and tacrolimus 0.5mg     Other medication(s) to be shipped: amLODIPine 5 MG,losartan 50 MG,traZODone 50 MG,triamterene-hydrochlorothiazide 37.5-25 mg,traZODone 50 MG.     Sophia Davis, DOB: 05-Feb-1946  Phone: [phone] (home)       All above HIPAA information was verified with patient.     Was a Nurse, learning disability used for this call? No    Completed refill call assessment today to schedule patient's medication shipment from the [facility] [phone]).  All relevant notes have been reviewed.     Specialty medication(s) and dose(s) confirmed: Regimen is correct and unchanged.   Changes to medications: [name] reports no changes at this time.  Changes to insurance: No  New side effects reported not previously addressed with a pharmacist or physician: None reported  Questions for the pharmacist: No    Confirmed patient received a Conservation officer, historic buildings and a Surveyor, mining with first shipment. The patient will receive a drug information handout for each medication shipped and additional FDA Medication Guides as required.       DISEASE/MEDICATION-SPECIFIC INFORMATION        N/A    SPECIALTY MEDICATION ADHERENCE     Medication Adherence    Patient reported X missed doses in the last month: 0  Specialty Medication: tacrolimus 0.5 MG  Patient is on additional specialty medications: Yes  Additional Specialty Medications: mycophenolate: MYFORTIC 180 MG  Patient Reported Additional Medication X Missed Doses in the Last Month: 0  Patient is on more than two specialty medications: No  Adherence tools used: patient uses a pill box to manage medications              Were doses missed due to medication being on hold? No    tacrolimus 0.5 mg: 10  days of medicine on hand   mycophenolate: MYFORTIC 180 mg: 10 days of medicine on hand     REFERRAL TO PHARMACIST     Referral to the pharmacist: Not needed      [facility]     Shipping address confirmed in Epic.     Delivery Scheduled: Yes, Expected medication delivery date: 05/27/2022.     Medication will be delivered via UPS to the prescription address in Epic WAM.    Sophia Davis   [facility] Specialty Technician

## 2022-05-20 MED ORDER — TRIAMTERENE 37.5 MG-HYDROCHLOROTHIAZIDE 25 MG CAPSULE
ORAL_CAPSULE | Freq: Every morning | ORAL | 11 refills | 30 days | Status: CP
Start: 2022-05-20 — End: 2023-05-20
  Filled 2022-05-26: qty 30, 30d supply, fill #0

## 2022-05-26 MED FILL — TRAZODONE 50 MG TABLET: ORAL | 90 days supply | Qty: 90 | Fill #2

## 2022-05-26 MED FILL — AMLODIPINE 5 MG TABLET: ORAL | 90 days supply | Qty: 90 | Fill #1

## 2022-05-26 MED FILL — MYFORTIC 180 MG TABLET,DELAYED RELEASE: ORAL | 30 days supply | Qty: 120 | Fill #6

## 2022-05-26 MED FILL — TACROLIMUS 0.5 MG CAPSULE, IMMEDIATE-RELEASE: ORAL | 30 days supply | Qty: 150 | Fill #8

## 2022-05-27 ENCOUNTER — Ambulatory Visit: Admit: 2022-05-27 | Discharge: 2022-05-27 | Payer: MEDICARE

## 2022-05-27 DIAGNOSIS — M3130 Wegener's granulomatosis without renal involvement: Principal | ICD-10-CM

## 2022-05-27 DIAGNOSIS — I10 Essential (primary) hypertension: Principal | ICD-10-CM

## 2022-05-27 DIAGNOSIS — N1832 Stage 3b chronic kidney disease (CMS-HCC): Principal | ICD-10-CM

## 2022-05-27 DIAGNOSIS — M810 Age-related osteoporosis without current pathological fracture: Principal | ICD-10-CM

## 2022-05-27 DIAGNOSIS — Z940 Kidney transplant status: Principal | ICD-10-CM

## 2022-05-27 DIAGNOSIS — C539 Malignant neoplasm of cervix uteri, unspecified: Principal | ICD-10-CM

## 2022-05-27 DIAGNOSIS — Z90710 Acquired absence of both cervix and uterus: Principal | ICD-10-CM

## 2022-05-27 DIAGNOSIS — I2510 Atherosclerotic heart disease of native coronary artery without angina pectoris: Principal | ICD-10-CM

## 2022-05-27 DIAGNOSIS — D849 Immunodeficiency, unspecified: Principal | ICD-10-CM

## 2022-05-27 DIAGNOSIS — N2581 Secondary hyperparathyroidism of renal origin: Principal | ICD-10-CM

## 2022-05-27 NOTE — Unmapped (Signed)
[facility]       Type of visit:  face to face    Reason for visit: Follow up     Questions / Concerns that need to be addressed:     - would like to discuss home lab draw results   - would prefer to do mammogram in [place] if possible     General Consent to Treat (GCT) for non-epic video visits only: Verbal consent      Hypertension:  Have blood pressure cuff at home?: yes- arm cuff  Regularly checking blood pressure?: yes  PTHomeBP     Manual BP #1- 150/58   Manual BP #2- 148/56      Allergies reviewed: Yes    Medication reviewed: Yes  Pended refills? No        HCDM reviewed and updated in Epic:    We are working to make sure all of our patients??? wishes are updated in Epic and part of that is documenting a Environmental health practitioner for each patient  A Health Care Decision Maker is someone you choose who can make health care decisions for you if you are not able - who would you most want to do this for you????  is already up to date.        BPAs completed:  none          __________________________________________________________________________________________    SCREENINGS COMPLETED IN FLOWSHEETS    HARK Screening       AUDIT       PHQ2       PHQ9          P4 Suicidality Screener                GAD7       COPD Assessment

## 2022-05-27 NOTE — Unmapped (Signed)
[facility]   [address], [place], [place] [address]

## 2022-05-28 NOTE — Unmapped (Signed)
[employer] forms

## 2022-06-02 ENCOUNTER — Other Ambulatory Visit
Admission: RE | Admit: 2022-06-02 | Discharge: 2022-06-02 | Disposition: A | Discharge status: Home / Self Care | Payer: Medicare Other | Attending: Nephrology | Admitting: Nephrology

## 2022-06-02 DIAGNOSIS — Z940 Kidney transplant status: Secondary | ICD-10-CM | POA: Insufficient documentation

## 2022-06-02 LAB — CBC WITH DIFFERENTIAL/PLATELET
Abs Immature Granulocytes: 0.01 10*3/uL (ref 0.00–0.07)
Basophils Absolute: 0 10*3/uL (ref 0.0–0.1)
Basophils Relative: 0 %
Eosinophils Absolute: 0.1 10*3/uL (ref 0.0–0.5)
Eosinophils Relative: 2 %
HCT: 33.9 % — ABNORMAL LOW (ref 36.0–46.0)
Hemoglobin: 10.7 g/dL — ABNORMAL LOW (ref 12.0–15.0)
Immature Granulocytes: 0 %
Lymphocytes Relative: 20 %
Lymphs Abs: 1 10*3/uL (ref 0.7–4.0)
MCH: 26.9 pg (ref 26.0–34.0)
MCHC: 31.6 g/dL (ref 30.0–36.0)
MCV: 85.2 fL (ref 80.0–100.0)
Monocytes Absolute: 0.5 10*3/uL (ref 0.1–1.0)
Monocytes Relative: 10 %
Neutro Abs: 3.4 10*3/uL (ref 1.7–7.7)
Neutrophils Relative %: 68 %
Platelets: 235 10*3/uL (ref 150–400)
RBC: 3.98 MIL/uL (ref 3.87–5.11)
RDW: 13.4 % (ref 11.5–15.5)
WBC: 5 10*3/uL (ref 4.0–10.5)
nRBC: 0 % (ref 0.0–0.2)

## 2022-06-02 LAB — COMPREHENSIVE METABOLIC PANEL
ALT: 10 U/L (ref 0–44)
AST: 15 U/L (ref 15–41)
Albumin: 4.4 g/dL (ref 3.5–5.0)
Alkaline Phosphatase: 55 U/L (ref 38–126)
Anion gap: 7 (ref 5–15)
BUN: 27 mg/dL — ABNORMAL HIGH (ref 8–23)
CO2: 22 mmol/L (ref 22–32)
Calcium: 9.6 mg/dL (ref 8.9–10.3)
Chloride: 108 mmol/L (ref 98–111)
Creatinine, Ser: 1.63 mg/dL — ABNORMAL HIGH (ref 0.44–1.00)
GFR, Estimated: 32 mL/min — ABNORMAL LOW (ref >60)
Glucose, Bld: 102 mg/dL — ABNORMAL HIGH (ref 70–99)
Potassium: 4.2 mmol/L (ref 3.5–5.1)
Sodium: 137 mmol/L (ref 135–145)
Total Bilirubin: 0.6 mg/dL (ref 0.3–1.2)
Total Protein: 6.7 g/dL (ref 6.5–8.1)

## 2022-06-02 NOTE — Unmapped (Signed)
Internal Medicine Clinic Visit    Reason for visit: 59-month follow-up for multiple problems    A/P:        Sophia Davis was seen today for follow-up.    Diagnoses and all orders for this visit:    Kidney transplant 05/17/2013  -     Comprehensive Metabolic Panel; Future  -     CBC w/ Differential; Future  -     Tacrolimus Level, Trough; Future  She is due for her every 54-month blood work.  Because these were not ordered ahead of time she has not had them drawn yet.  I have placed the orders and she will get them drawn next week.  Secondary hyperparathyroidism (CMS-HCC)  Continue on vitamin D  Granulomatosis with polyangiitis, unspecified whether renal involvement (CMS-HCC)  Her polyangiitis appears to be quiescent and no change needed in her medication regimen.  She is on immunosuppressive due to her kidney transplant.  Malignant neoplasm of cervix, unspecified site (CMS-HCC)  She has a history of cervical cancer but had a hysterectomy in has been cancer free for many many years.  Stage 3b chronic kidney disease  Her creatinine has been stable over time her last GFR was 37.  We will continue to monitor should get her blood drawn next week.  Status post total abdominal hysterectomy  As noted above for history of cervical cancer  Immunosuppression (CMS-HCC)  She is in an immunosuppressed state due to her kidney transplant.  She is tolerating that well.  She does not want further COVID vaccines.  She is worried that they make her sicker.  Essential hypertension (RAF-HCC)  Her blood pressure is well controlled I noted that her home blood pressure was 121/65.  She is often high here in the clinic but were not making adjustments as long as her home blood pressures are normal.  Coronary artery disease involving native heart without angina pectoris, unspecified vessel or lesion type  She is on good medical management of her coronary disease.  She will continue on aspirin and atorvastatin.  Her blood pressure is well controlled.  Osteoporosis, unspecified osteoporosis type, unspecified pathological fracture presence  Continue to take alendronate  Cramping  I recommended Stretching exercises before getting into bed.      __________________________________________________________    HPI:    76 y.o. female here for 33-month follow-up.  She is overall doing well.  She does occasionally get nighttime cramps that seem to affect her feet and lower legs.    She remains active and continues to live independently cooking for herself and taking care of the house.    She has not had any chest pain.  Her blood pressures been well controlled at home.  She is taking all of her medications regularly.  __________________________________________________________      Medications:  See below.    __________________________________________________________    Physical Exam:   Vital Signs:  Vitals:    05/27/22 0909 05/27/22 0920   BP: 150/58 148/56   Temp: 36.4 ??C (97.5 ??F)    TempSrc: Oral    SpO2: 98%    Weight: 51.3 kg (113 lb 3.2 oz)    Height: 154.9 cm (5' 1)        Gen: Well appearing, NAD  HEENT: No cervical lymphadenopathy, OP clear. No thyromegaly  CV: RRR, no murmurs  Pulm: CTA bilaterally, no crackles or wheezes  GI: abdomen soft, NTND, normal BS. No HSM.  Pulses: 2+ radial  Ext: No edema  Your Medication List            Accurate as of May 27, 2022 11:59 PM. If you have any questions, ask your nurse or doctor.                CONTINUE taking these medications      alendronate 70 MG tablet  Commonly known as: FOSAMAX  Take 1 tablet (70 mg total) by mouth every seven (7) days.     amLODIPine 5 MG tablet  Commonly known as: NORVASC  Take 1 tablet (5 mg total) by mouth nightly.     aspirin 81 MG chewable tablet  Chew 1 tablet (81 mg total) daily.     atorvastatin 40 MG tablet  Commonly known as: LIPITOR  Take 1 tablet by mouth every evening     cholecalciferol (vitamin D3 25 mcg (1,000 units)) 1,000 unit (25 mcg) tablet  Take 1 tablet (25 mcg total) by mouth daily.     famotidine 20 MG tablet  Commonly known as: PEPCID  TAKE 1 TABLET (20 MG TOTAL) BY MOUTH TWO (2) TIMES A DAY AS NEEDED FOR HEARTBURN.     losartan 50 MG tablet  Commonly known as: COZAAR  Take 1 tablet (50 mg total) by mouth two (2) times a day.     MYFORTIC 180 MG EC tablet  Generic drug: mycophenolate  Take 2 tablets (360 mg total) by mouth two (2) times a day.     tacrolimus 0.5 MG capsule  Commonly known as: PROGRAF  Take 3 capsules (1.5 mg total) by mouth in the morning AND 2 capsules (1 mg total) in the evening.     traZODone 50 MG tablet  Commonly known as: DESYREL  Take 1 tablet (50 mg total) by mouth nightly.     triamterene-hydroCHLOROthiazide 37.5-25 mg per capsule  Commonly known as: DYAZIDE  Take 1 capsule by mouth every morning.                This note was dictated using voice recognition software.  Please forgive typos.

## 2022-06-04 LAB — TACROLIMUS LEVEL: Tacrolimus (FK506) - LabCorp: 4.9 ng/mL (ref 2.0–20.0)

## 2022-06-18 MED ORDER — ATORVASTATIN 40 MG TABLET
ORAL_TABLET | Freq: Every evening | ORAL | 3 refills | 90 days
Start: 2022-06-18 — End: 2023-06-18

## 2022-06-18 MED ORDER — CHOLECALCIFEROL (VITAMIN D3) 25 MCG (1,000 UNIT) TABLET
ORAL_TABLET | Freq: Every day | ORAL | 3 refills | 90.00000 days | Status: CP
Start: 2022-06-18 — End: 2023-06-18
  Filled 2022-06-24: qty 90, 90d supply, fill #0

## 2022-06-18 NOTE — Unmapped (Signed)
Received  message from Ernestine Mcmurray with [facility] that the order for the cholecalciferol had been accidentally discontinued while preparing for shipment.  A new prescription for the cholecalciferol was sent to [facility] as requested.

## 2022-06-18 NOTE — Unmapped (Signed)
The patient is requesting a medication refill

## 2022-06-18 NOTE — Unmapped (Signed)
[facility] Refill Coordination Note    Specialty Medication(s) to be Shipped:   Transplant: Myfortic 180mg  and tacrolimus 0.5mg     Other medication(s) to be shipped:  vitamind, atorvastatin 40mg , alendronate 70 MG,triamterene-hydroCHLOROthiazide 37.5-25 mg     Sophia Davis, DOB: 1946-06-23  Phone: [phone] (home)       All above HIPAA information was verified with patient.     Was a Nurse, learning disability used for this call? No    Completed refill call assessment today to schedule patient's medication shipment from the [facility] [phone]).  All relevant notes have been reviewed.     Specialty medication(s) and dose(s) confirmed: Regimen is correct and unchanged.   Changes to medications: Linsay reports no changes at this time.  Changes to insurance: No  New side effects reported not previously addressed with a pharmacist or physician: None reported  Questions for the pharmacist: No    Confirmed patient received a Conservation officer, historic buildings and a Surveyor, mining with first shipment. The patient will receive a drug information handout for each medication shipped and additional FDA Medication Guides as required.       DISEASE/MEDICATION-SPECIFIC INFORMATION        N/A    SPECIALTY MEDICATION ADHERENCE     Medication Adherence    Patient reported X missed doses in the last month: 0  Specialty Medication: MYFORTIC 180 MG  Patient is on additional specialty medications: Yes  Additional Specialty Medications: tacrolimus 0.5 MG  Patient Reported Additional Medication X Missed Doses in the Last Month: 0  Patient is on more than two specialty medications: No      Adherence tools used: patient uses a pill box to manage medications                          Were doses missed due to medication being on hold? No    MYFORTIC 180 mg: 7 days of medicine on hand   tacrolimus 0.5 mg: 7 days of medicine on hand       REFERRAL TO PHARMACIST     Referral to the pharmacist: Not needed      [facility]     Shipping address confirmed in Epic.     Delivery Scheduled: Yes, Expected medication delivery date: 06/24/22.     Medication will be delivered via Same Day Courier to the prescription address in Epic WAM.    Ernestine Mcmurray   [facility] Specialty Technician

## 2022-06-18 NOTE — Unmapped (Signed)
Last ordered: 3 months ago (03/13/2022) by Edmonia James, MD

## 2022-06-24 MED FILL — ATORVASTATIN 40 MG TABLET: ORAL | 90 days supply | Qty: 90 | Fill #1

## 2022-06-24 MED FILL — TRIAMTERENE 37.5 MG-HYDROCHLOROTHIAZIDE 25 MG CAPSULE: ORAL | 30 days supply | Qty: 30 | Fill #1

## 2022-06-24 MED FILL — TACROLIMUS 0.5 MG CAPSULE, IMMEDIATE-RELEASE: ORAL | 30 days supply | Qty: 150 | Fill #9

## 2022-06-24 MED FILL — ALENDRONATE 70 MG TABLET: ORAL | 84 days supply | Qty: 12 | Fill #2

## 2022-06-24 MED FILL — MYFORTIC 180 MG TABLET,DELAYED RELEASE: ORAL | 30 days supply | Qty: 120 | Fill #7

## 2022-06-30 MED ORDER — ATORVASTATIN 40 MG TABLET
ORAL_TABLET | Freq: Every evening | ORAL | 3 refills | 90 days | Status: CP
Start: 2022-06-30 — End: 2023-06-30
  Filled 2022-09-15: qty 90, 90d supply, fill #0

## 2022-07-15 NOTE — Unmapped (Signed)
[facility] Refill Coordination Note    Specialty Medication(s) to be Shipped:   Transplant: Myfortic 180mg  and tacrolimus 0.5mg     Other medication(s) to be shipped:  triamterene/hctz     Sophia Davis, DOB: 1946/01/20  Phone: [phone] (home)       All above HIPAA information was verified with patient.     Was a Nurse, learning disability used for this call? No    Completed refill call assessment today to schedule patient's medication shipment from the [facility] [phone]).  All relevant notes have been reviewed.     Specialty medication(s) and dose(s) confirmed: Regimen is correct and unchanged.   Changes to medications: Sophia Davis reports no changes at this time.  Changes to insurance: No  New side effects reported not previously addressed with a pharmacist or physician: None reported  Questions for the pharmacist: No    Confirmed patient received a Conservation officer, historic buildings and a Surveyor, mining with first shipment. The patient will receive a drug information handout for each medication shipped and additional FDA Medication Guides as required.       DISEASE/MEDICATION-SPECIFIC INFORMATION        N/A    SPECIALTY MEDICATION ADHERENCE     Medication Adherence    Patient reported X missed doses in the last month: 0  Specialty Medication: Myfortic 180mg   Patient is on additional specialty medications: Yes  Additional Specialty Medications: Tacrolimus 0.5mg   Patient Reported Additional Medication X Missed Doses in the Last Month: 0  Patient is on more than two specialty medications: No      Adherence tools used: patient uses a pill box to manage medications                          Were doses missed due to medication being on hold? No    Myfortic 180 mg: 10 days of medicine on hand   Tacrolimus 0.5 mg: 10 days of medicine on hand     REFERRAL TO PHARMACIST     Referral to the pharmacist: Not needed      [facility]     Shipping address confirmed in Epic.     Delivery Scheduled: Yes, Expected medication delivery date: 07/20/22.     Medication will be delivered via Same Day Courier to the prescription address in Epic WAM.    Sophia Davis, [facility]   [facility] Shared [facility] Specialty Pharmacist

## 2022-07-20 MED FILL — MYFORTIC 180 MG TABLET,DELAYED RELEASE: ORAL | 30 days supply | Qty: 120 | Fill #8

## 2022-07-20 MED FILL — TRIAMTERENE 37.5 MG-HYDROCHLOROTHIAZIDE 25 MG CAPSULE: ORAL | 30 days supply | Qty: 30 | Fill #2

## 2022-07-20 MED FILL — TACROLIMUS 0.5 MG CAPSULE, IMMEDIATE-RELEASE: ORAL | 30 days supply | Qty: 150 | Fill #10

## 2022-08-10 MED ORDER — AMLODIPINE 5 MG TABLET
ORAL_TABLET | Freq: Every evening | ORAL | 3 refills | 180 days | Status: CP
Start: 2022-08-10 — End: 2024-07-30
  Filled 2022-08-18: qty 90, 90d supply, fill #0

## 2022-08-10 NOTE — Unmapped (Signed)
[facility] Refill Coordination Note    Specialty Medication(s) to be Shipped:   Transplant: Myfortic 180mg  and tacrolimus 0.5mg     Other medication(s) to be shipped:  amlodipine,losartan ,Sophia Davis, DOB: 16-Oct-1946  Phone: [phone] (home)       All above HIPAA information was verified with patient.     Was a Nurse, learning disability used for this call? No    Completed refill call assessment today to schedule patient's medication shipment from the [facility] [phone]).  All relevant notes have been reviewed.     Specialty medication(s) and dose(s) confirmed: Regimen is correct and unchanged.   Changes to medications: Syeeda reports no changes at this time.  Changes to insurance: No  New side effects reported not previously addressed with a pharmacist or physician: None reported  Questions for the pharmacist: No    Confirmed patient received a Conservation officer, historic buildings and a Surveyor, mining with first shipment. The patient will receive a drug information handout for each medication shipped and additional FDA Medication Guides as required.       DISEASE/MEDICATION-SPECIFIC INFORMATION        N/A    SPECIALTY MEDICATION ADHERENCE     Medication Adherence    Patient reported X missed doses in the last month: 0  Specialty Medication: Myfortic 180 mg  Patient is on additional specialty medications: Yes  Additional Specialty Medications: Tacrolimus 0.5 mg   Patient Reported Additional Medication X Missed Doses in the Last Month: 0  Patient is on more than two specialty medications: No  Any gaps in refill history greater than 2 weeks in the last 3 months: no  Demonstrates understanding of importance of adherence: yes  Informant: patient  Reliability of informant: reliable  Provider-estimated medication adherence level: good  Patient is at risk for Non-Adherence: No  Reasons for non-adherence: no problems identified      Adherence tools used: patient uses a pill box to manage medications            Confirmed plan for next specialty medication refill: delivery by pharmacy  Refills needed for supportive medications: not needed          Refill Coordination    Has the Patients' Contact Information Changed: No  Is the Shipping Address Different: No         Were doses missed due to medication being on hold? No    myfortic 180 mg: 10 days of medicine on hand   Tacrolimus  0.5 mg: 10 days of medicine on hand       REFERRAL TO PHARMACIST     Referral to the pharmacist: Not needed      [facility]     Shipping address confirmed in Epic.     Delivery Scheduled: Yes, Expected medication delivery date: 11/01.     Medication will be delivered via Next Day Courier to the prescription address in Epic WAM.    Antonietta Barcelona   [facility] Specialty Technician

## 2022-08-18 MED FILL — TRAZODONE 50 MG TABLET: ORAL | 90 days supply | Qty: 90 | Fill #3

## 2022-08-18 MED FILL — LOSARTAN 50 MG TABLET: ORAL | 90 days supply | Qty: 180 | Fill #1

## 2022-08-18 MED FILL — MYFORTIC 180 MG TABLET,DELAYED RELEASE: ORAL | 30 days supply | Qty: 120 | Fill #9

## 2022-08-18 MED FILL — TRIAMTERENE 37.5 MG-HYDROCHLOROTHIAZIDE 25 MG CAPSULE: ORAL | 30 days supply | Qty: 30 | Fill #3

## 2022-08-18 MED FILL — TACROLIMUS 0.5 MG CAPSULE, IMMEDIATE-RELEASE: ORAL | 30 days supply | Qty: 150 | Fill #11

## 2022-09-08 DIAGNOSIS — Z940 Kidney transplant status: Principal | ICD-10-CM

## 2022-09-08 DIAGNOSIS — D849 Immunodeficiency, unspecified: Principal | ICD-10-CM

## 2022-09-08 MED ORDER — TACROLIMUS 0.5 MG CAPSULE, IMMEDIATE-RELEASE
ORAL_CAPSULE | ORAL | 11 refills | 30 days | Status: CP
Start: 2022-09-08 — End: ?
  Filled 2022-09-15: qty 150, 30d supply, fill #0

## 2022-09-08 MED ORDER — ALENDRONATE 70 MG TABLET
ORAL_TABLET | ORAL | 2 refills | 84 days | Status: CP
Start: 2022-09-08 — End: 2023-09-08
  Filled 2022-09-15: qty 12, 84d supply, fill #0

## 2022-09-08 NOTE — Unmapped (Signed)
Pt request for RX Refill

## 2022-09-08 NOTE — Unmapped (Addendum)
[facility] Clinical Assessment & Refill Coordination Note    Sophia Davis, DOB: 1946-02-28  Phone: [phone] (home)     All above HIPAA information was verified with patient.     Was a Nurse, learning disability used for this call? No    Specialty Medication(s):   Transplant: Myfortic 180mg  and tacrolimus 0.5mg      Current Outpatient Medications   Medication Sig Dispense Refill   ??? alendronate (FOSAMAX) 70 MG tablet Take 1 tablet (70 mg total) by mouth every seven (7) days. 12 tablet 2   ??? amlodipine (NORVASC) 5 MG tablet Take 1 tablet (5 mg total) by mouth nightly. 180 tablet 3   ??? aspirin 81 MG chewable tablet Chew 1 tablet (81 mg total) daily.     ??? atorvastatin (LIPITOR) 40 MG tablet Take 1 tablet by mouth every evening 90 tablet 3   ??? cholecalciferol, vitamin D3 25 mcg, 1,000 units,, 1,000 unit (25 mcg) tablet Take 1 tablet (25 mcg total) by mouth daily. 90 tablet 3   ??? famotidine (PEPCID) 20 MG tablet TAKE 1 TABLET (20 MG TOTAL) BY MOUTH TWO (2) TIMES A DAY AS NEEDED FOR HEARTBURN. 180 tablet 3   ??? losartan (COZAAR) 50 MG tablet Take 1 tablet (50 mg total) by mouth two (2) times a day. 180 tablet 2   ??? MYFORTIC 180 mg EC tablet Take 2 tablets (360 mg total) by mouth two (2) times a day. 360 tablet 3   ??? tacrolimus (PROGRAF) 0.5 MG capsule Take 3 capsules (1.5 mg total) by mouth in the morning AND 2 capsules (1 mg total) in the evening. 150 capsule 11   ??? traZODone (DESYREL) 50 MG tablet Take 1 tablet (50 mg total) by mouth nightly. 90 tablet 3   ??? triamterene-hydroCHLOROthiazide (DYAZIDE) 37.5-25 mg per capsule Take 1 capsule by mouth every morning. 30 capsule 11     No current facility-administered medications for this visit.        Changes to medications: Irianna reports no changes at this time.    No Known Allergies    Changes to allergies: No    SPECIALTY MEDICATION ADHERENCE     Myfortic 180mg   : 12 days of medicine on hand   Tacrolimus 0.5mg   : 12 days of medicine on hand Medication Adherence    Patient reported X missed doses in the last month: 0  Specialty Medication: myfortic  180mg   Patient is on additional specialty medications: Yes  Additional Specialty Medications: Tacrolimus 0.5mg   Patient Reported Additional Medication X Missed Doses in the Last Month: 0      Adherence tools used: patient uses a pill box to manage medications                  Specialty medication(s) dose(s) confirmed: Regimen is correct and unchanged.     Are there any concerns with adherence? No    Adherence counseling provided? Not needed    CLINICAL MANAGEMENT AND INTERVENTION      Clinical Benefit Assessment:    Do you feel the medicine is effective or helping your condition? Yes    Clinical Benefit counseling provided? Not needed    Adverse Effects Assessment:    Are you experiencing any side effects? No    Are you experiencing difficulty administering your medicine? No    Quality of Life Assessment:    Quality of Life    Rheumatology  Oncology  Dermatology  Cystic Fibrosis  How many days over the past month did your transplant  keep you from your normal activities? For example, brushing your teeth or getting up in the morning. 0    Have you discussed this with your provider? Not needed    Acute Infection Status:    Acute infections noted within Epic:  No active infections  Patient reported infection: None    Therapy Appropriateness:    Is therapy appropriate and patient progressing towards therapeutic goals? Yes, therapy is appropriate and should be continued    DISEASE/MEDICATION-SPECIFIC INFORMATION      N/A    Solid Organ Transplant: Not Applicable    PATIENT SPECIFIC NEEDS     - Does the patient have any physical, cognitive, or cultural barriers? No    - Is the patient high risk? Yes, patient is taking a REMS drug. Medication is dispensed in compliance with REMS program    - Did the patient require a clinical intervention? No    - Does the patient require physician intervention or other additional services (i.e., nutrition, smoking cessation, social work)? No    SOCIAL DETERMINANTS OF HEALTH     At the [facility], we have learned that life circumstances - like trouble affording food, housing, utilities, or transportation can affect the health of many of our patients.   That is why we wanted to ask: are you currently experiencing any life circumstances that are negatively impacting your health and/or quality of life? No    Social Determinants of Health     Financial Resource Strain: Not on file   Internet Connectivity: Not on file   Food Insecurity: Not on file   Tobacco Use: Low Risk  (05/27/2022)    Patient History    ??? Smoking Tobacco Use: Never    ??? Smokeless Tobacco Use: Never    ??? Passive Exposure: Not on file   Housing/Utilities: Not on file   Alcohol Use: Not At Risk (07/02/2021)    Alcohol Use    ??? How often do you have a drink containing alcohol?: Never    ??? How many drinks containing alcohol do you have on a typical day when you are drinking?: 1 - 2    ??? How often do you have 5 or more drinks on one occasion?: Never   Transportation Needs: Not on file   Substance Use: Low Risk  (10/02/2020)    Substance Use    ??? Taken prescription drugs for non-medical reasons: Never    ??? Taken illegal drugs: Never    ??? Patient indicated they have taken drugs in the past year for non-medical reasons: Yes, [positive answer(s)]: Not on file   Health Literacy: Not on file   Physical Activity: Not on file   Interpersonal Safety: Not on file   Stress: Not on file   Intimate Partner Violence: Not At Risk (10/02/2020)    Humiliation, Afraid, Rape, and Kick questionnaire    ??? Fear of Current or Ex-Partner: No    ??? Emotionally Abused: No    ??? Physically Abused: No    ??? Sexually Abused: No   Depression: Not at risk (01/01/2021)    PHQ-2    ??? PHQ-2 Score: 0   Social Connections: Not on file       Would you be willing to receive help with any of the needs that you have identified today? Not applicable SHIPPING     Specialty Medication(s) to be Shipped:   Transplant: Myfortic 180mg  and  tacrolimus 0.5mg     Other medication(s) to be shipped: alendronate, atorvastatin, vitamin d, dyazide     Changes to insurance: No    Delivery Scheduled: Yes, Expected medication delivery date: 09/16/2022.  However, Rx request for refills was sent to the provider as there are none remaining.     Medication will be delivered via Next Day Courier to the confirmed prescription address in [facility].    The patient will receive a drug information handout for each medication shipped and additional FDA Medication Guides as required.  Verified that patient has previously received a Conservation officer, historic buildings and a Surveyor, mining.    The patient or caregiver noted above participated in the development of this care plan and knows that they can request review of or adjustments to the care plan at any time.      All of the patient's questions and concerns have been addressed.    Thad Ranger, PharmD   [facility] Specialty Pharmacist

## 2022-09-15 MED FILL — MYFORTIC 180 MG TABLET,DELAYED RELEASE: ORAL | 30 days supply | Qty: 120 | Fill #10

## 2022-09-15 MED FILL — CHOLECALCIFEROL (VITAMIN D3) 25 MCG (1,000 UNIT) TABLET: ORAL | 90 days supply | Qty: 90 | Fill #1

## 2022-09-15 MED FILL — TRIAMTERENE 37.5 MG-HYDROCHLOROTHIAZIDE 25 MG CAPSULE: ORAL | 30 days supply | Qty: 30 | Fill #4

## 2022-10-14 NOTE — Unmapped (Signed)
[facility] Refill Coordination Note    Specialty Medication(s) to be Shipped:   Transplant: Myfortic 180mg  and tacrolimus 0.5mg     Other medication(s) to be shipped:  triamterene/hctz     Sophia Davis, DOB: Mar 17, 1946  Phone: [phone] (home)       All above HIPAA information was verified with patient.     Was a Nurse, learning disability used for this call? No    Completed refill call assessment today to schedule patient's medication shipment from the [facility] [phone]).  All relevant notes have been reviewed.     Specialty medication(s) and dose(s) confirmed: Regimen is correct and unchanged.   Changes to medications: Sophia Davis reports no changes at this time.  Changes to insurance: No  New side effects reported not previously addressed with a pharmacist or physician: None reported  Questions for the pharmacist: No    Confirmed patient received a Conservation officer, historic buildings and a Surveyor, mining with first shipment. The patient will receive a drug information handout for each medication shipped and additional FDA Medication Guides as required.       DISEASE/MEDICATION-SPECIFIC INFORMATION        N/A    SPECIALTY MEDICATION ADHERENCE     Medication Adherence    Patient reported X missed doses in the last month: 0  Specialty Medication: MYFORTIC 180 MG EC tablet (mycophenolate)  Patient is on additional specialty medications: Yes  Additional Specialty Medications: tacrolimus 0.5 MG capsule (PROGRAF)  Patient Reported Additional Medication X Missed Doses in the Last Month: 0  Patient is on more than two specialty medications: No      Adherence tools used: patient uses a pill box to manage medications                          Were doses missed due to medication being on hold? No    Myfortic 180 mg: 10 days of medicine on hand   Tacrolimus 0.5 mg: 10 days of medicine on hand     REFERRAL TO PHARMACIST     Referral to the pharmacist: Not needed      [facility]     Shipping address confirmed in Epic.     Delivery Scheduled: Yes, Expected medication delivery date: 10/16/22.     Medication will be delivered via Same Day Courier to the prescription address in Epic WAM.    Ernestine Mcmurray   [facility] Shared [facility] Specialty Technician

## 2022-10-16 MED FILL — MYFORTIC 180 MG TABLET,DELAYED RELEASE: ORAL | 30 days supply | Qty: 120 | Fill #11

## 2022-10-16 MED FILL — TACROLIMUS 0.5 MG CAPSULE, IMMEDIATE-RELEASE: ORAL | 30 days supply | Qty: 150 | Fill #1

## 2022-10-16 MED FILL — TRIAMTERENE 37.5 MG-HYDROCHLOROTHIAZIDE 25 MG CAPSULE: ORAL | 90 days supply | Qty: 90 | Fill #5

## 2022-11-05 DIAGNOSIS — Z940 Kidney transplant status: Principal | ICD-10-CM

## 2022-11-05 MED ORDER — TRAZODONE 50 MG TABLET
ORAL_TABLET | Freq: Every evening | ORAL | 3 refills | 90 days | Status: CP
Start: 2022-11-05 — End: ?
  Filled 2022-11-10: qty 90, 90d supply, fill #0

## 2022-11-05 MED ORDER — MYFORTIC 180 MG TABLET,DELAYED RELEASE
ORAL_TABLET | Freq: Two times a day (BID) | ORAL | 3 refills | 90 days | Status: CP
Start: 2022-11-05 — End: 2023-11-05
  Filled 2022-11-10: qty 120, 30d supply, fill #0

## 2022-11-05 NOTE — Unmapped (Signed)
[facility] Refill Coordination Note    Specialty Medication(s) to be Shipped:   Transplant: Myfortic 180mg  and tacrolimus 1mg     Other medication(s) to be shipped:  amlodipine , losartan , trazodone      Sophia Davis, DOB: 1946/06/10  Phone: [phone] (home)       All above HIPAA information was verified with patient.     Was a Nurse, learning disability used for this call? No    Completed refill call assessment today to schedule patient's medication shipment from the [facility] [phone]).  All relevant notes have been reviewed.     Specialty medication(s) and dose(s) confirmed: Regimen is correct and unchanged.   Changes to medications: Sophia Davis reports no changes at this time.  Changes to insurance: No  New side effects reported not previously addressed with a pharmacist or physician: None reported  Questions for the pharmacist: No    Confirmed patient received a Conservation officer, historic buildings and a Surveyor, mining with first shipment. The patient will receive a drug information handout for each medication shipped and additional FDA Medication Guides as required.       DISEASE/MEDICATION-SPECIFIC INFORMATION        N/A    SPECIALTY MEDICATION ADHERENCE     Medication Adherence    Patient reported X missed doses in the last month: 0  Specialty Medication: MYFORTIC 180 MG EC tablet (mycophenolate)  Patient is on additional specialty medications: Yes  Additional Specialty Medications: tacrolimus 0.5 MG capsule (PROGRAF)  Patient Reported Additional Medication X Missed Doses in the Last Month: 0  Patient is on more than two specialty medications: No      Adherence tools used: patient uses a pill box to manage medications                          Were doses missed due to medication being on hold? No    Myfortic 180 mg: 10 days of medicine on hand   tacrolimus 0.5 mg: 10 days of medicine on hand       REFERRAL TO PHARMACIST     Referral to the pharmacist: Not needed      [facility] Shipping address confirmed in Epic.     Delivery Scheduled: Yes, Expected medication delivery date: 11/11/22.     Medication will be delivered via Next Day Courier to the prescription address in Epic WAM.    [name]   [facility] Specialty Technician

## 2022-11-09 ENCOUNTER — Other Ambulatory Visit
Admission: RE | Admit: 2022-11-09 | Discharge: 2022-11-09 | Disposition: A | Discharge status: Home / Self Care | Payer: Medicare Other | Attending: Nephrology | Admitting: Nephrology

## 2022-11-09 DIAGNOSIS — E1129 Type 2 diabetes mellitus with other diabetic kidney complication: Secondary | ICD-10-CM | POA: Diagnosis not present

## 2022-11-09 DIAGNOSIS — D899 Disorder involving the immune mechanism, unspecified: Secondary | ICD-10-CM | POA: Insufficient documentation

## 2022-11-09 DIAGNOSIS — Z940 Kidney transplant status: Secondary | ICD-10-CM | POA: Insufficient documentation

## 2022-11-09 DIAGNOSIS — B259 Cytomegaloviral disease, unspecified: Secondary | ICD-10-CM | POA: Insufficient documentation

## 2022-11-09 DIAGNOSIS — T8610 Unspecified complication of kidney transplant: Secondary | ICD-10-CM | POA: Insufficient documentation

## 2022-11-09 DIAGNOSIS — Z79899 Other long term (current) drug therapy: Secondary | ICD-10-CM | POA: Insufficient documentation

## 2022-11-09 DIAGNOSIS — Z789 Other specified health status: Secondary | ICD-10-CM | POA: Diagnosis not present

## 2022-11-09 DIAGNOSIS — E559 Vitamin D deficiency, unspecified: Secondary | ICD-10-CM | POA: Insufficient documentation

## 2022-11-09 DIAGNOSIS — Z09 Encounter for follow-up examination after completed treatment for conditions other than malignant neoplasm: Secondary | ICD-10-CM | POA: Insufficient documentation

## 2022-11-09 DIAGNOSIS — D631 Anemia in chronic kidney disease: Secondary | ICD-10-CM | POA: Diagnosis not present

## 2022-11-09 DIAGNOSIS — N390 Urinary tract infection, site not specified: Secondary | ICD-10-CM | POA: Diagnosis not present

## 2022-11-09 DIAGNOSIS — Z114 Encounter for screening for human immunodeficiency virus [HIV]: Secondary | ICD-10-CM | POA: Insufficient documentation

## 2022-11-09 DIAGNOSIS — Z9483 Pancreas transplant status: Secondary | ICD-10-CM | POA: Insufficient documentation

## 2022-11-09 LAB — CBC WITH DIFFERENTIAL/PLATELET
Abs Immature Granulocytes: 0.02 10*3/uL (ref 0.00–0.07)
Basophils Absolute: 0 10*3/uL (ref 0.0–0.1)
Basophils Relative: 0 %
Eosinophils Absolute: 0.1 10*3/uL (ref 0.0–0.5)
Eosinophils Relative: 2 %
HCT: 32.7 % — ABNORMAL LOW (ref 36.0–46.0)
Hemoglobin: 10.1 g/dL — ABNORMAL LOW (ref 12.0–15.0)
Immature Granulocytes: 0 %
Lymphocytes Relative: 16 %
Lymphs Abs: 0.8 10*3/uL (ref 0.7–4.0)
MCH: 26.7 pg (ref 26.0–34.0)
MCHC: 30.9 g/dL (ref 30.0–36.0)
MCV: 86.5 fL (ref 80.0–100.0)
Monocytes Absolute: 0.5 10*3/uL (ref 0.1–1.0)
Monocytes Relative: 9 %
Neutro Abs: 3.5 10*3/uL (ref 1.7–7.7)
Neutrophils Relative %: 73 %
Platelets: 236 10*3/uL (ref 150–400)
RBC: 3.78 MIL/uL — ABNORMAL LOW (ref 3.87–5.11)
RDW: 14.1 % (ref 11.5–15.5)
WBC: 4.8 10*3/uL (ref 4.0–10.5)
nRBC: 0 % (ref 0.0–0.2)

## 2022-11-09 LAB — BASIC METABOLIC PANEL
Anion gap: 8 (ref 5–15)
BUN: 20 mg/dL (ref 8–23)
CO2: 23 mmol/L (ref 22–32)
Calcium: 8.9 mg/dL (ref 8.9–10.3)
Chloride: 106 mmol/L (ref 98–111)
Creatinine, Ser: 1.41 mg/dL — ABNORMAL HIGH (ref 0.44–1.00)
GFR, Estimated: 38 mL/min — ABNORMAL LOW (ref >60)
Glucose, Bld: 104 mg/dL — ABNORMAL HIGH (ref 70–99)
Potassium: 4.2 mmol/L (ref 3.5–5.1)
Sodium: 137 mmol/L (ref 135–145)

## 2022-11-09 LAB — PHOSPHORUS: Phosphorus: 4 mg/dL (ref 2.5–4.6)

## 2022-11-09 LAB — MAGNESIUM: Magnesium: 1.7 mg/dL (ref 1.7–2.4)

## 2022-11-10 LAB — TACROLIMUS LEVEL: Tacrolimus (FK506) - LabCorp: 5.4 ng/mL (ref 2.0–20.0)

## 2022-11-10 MED FILL — LOSARTAN 50 MG TABLET: ORAL | 90 days supply | Qty: 180 | Fill #2

## 2022-11-10 MED FILL — TACROLIMUS 0.5 MG CAPSULE, IMMEDIATE-RELEASE: ORAL | 30 days supply | Qty: 150 | Fill #2

## 2022-11-10 MED FILL — AMLODIPINE 5 MG TABLET: ORAL | 90 days supply | Qty: 90 | Fill #1

## 2022-12-01 NOTE — Unmapped (Signed)
[facility] Refill Coordination Note    Specialty Medication(s) to be Shipped:   Transplant: Myfortic 180mg  and tacrolimus 0.5mg     Other medication(s) to be shipped:  Vit D, alendronate , atorvastatin     Sophia Davis, DOB: 1946-03-16  Phone: [phone] (home)       All above HIPAA information was verified with patient.     Was a Nurse, learning disability used for this call? No    Completed refill call assessment today to schedule patient's medication shipment from the [facility] [phone]).  All relevant notes have been reviewed.     Specialty medication(s) and dose(s) confirmed: Regimen is correct and unchanged.   Changes to medications: [name] reports no changes at this time.  Changes to insurance: No  New side effects reported not previously addressed with a pharmacist or physician: None reported  Questions for the pharmacist: No    Confirmed patient received a Conservation officer, historic buildings and a Surveyor, mining with first shipment. The patient will receive a drug information handout for each medication shipped and additional FDA Medication Guides as required.       DISEASE/MEDICATION-SPECIFIC INFORMATION        N/A    SPECIALTY MEDICATION ADHERENCE     Medication Adherence    Patient reported X missed doses in the last month: 0  Specialty Medication: Myfortic 180mg   Patient is on additional specialty medications: Yes  Additional Specialty Medications: Tacrolimus 0.5mg   Patient Reported Additional Medication X Missed Doses in the Last Month: 0  Patient is on more than two specialty medications: No  Adherence tools used: patient uses a pill box to manage medications              Were doses missed due to medication being on hold? No    Myfortic 180 mg: 10 days of medicine on hand   Tacrolimus 0.5 mg: 10 days of medicine on hand     REFERRAL TO PHARMACIST     Referral to the pharmacist: Not needed      [facility]     Shipping address confirmed in Epic.     Patient was notified of new phone menu : Yes: Patient is aware of changes coming in March.    Delivery Scheduled: Yes, Expected medication delivery date: 12/08/22.     Medication will be delivered via Next Day Courier to the prescription address in Epic WAM.    [name], [facility]   [facility] Specialty Pharmacist

## 2022-12-02 ENCOUNTER — Ambulatory Visit: Admit: 2022-12-02 | Discharge: 2022-12-03 | Payer: MEDICARE

## 2022-12-02 DIAGNOSIS — Z940 Kidney transplant status: Principal | ICD-10-CM

## 2022-12-02 DIAGNOSIS — M810 Age-related osteoporosis without current pathological fracture: Principal | ICD-10-CM

## 2022-12-02 DIAGNOSIS — I10 Essential (primary) hypertension: Principal | ICD-10-CM

## 2022-12-02 LAB — COMPREHENSIVE METABOLIC PANEL
ALBUMIN: 3.9 g/dL (ref 3.4–5.0)
ALKALINE PHOSPHATASE: 67 U/L (ref 46–116)
ALT (SGPT): 9 U/L — ABNORMAL LOW (ref 10–49)
ANION GAP: 9 mmol/L (ref 5–14)
AST (SGOT): 16 U/L (ref <=34)
BILIRUBIN TOTAL: 0.8 mg/dL (ref 0.3–1.2)
BLOOD UREA NITROGEN: 18 mg/dL (ref 9–23)
BUN / CREAT RATIO: 15
CALCIUM: 9.3 mg/dL (ref 8.7–10.4)
CHLORIDE: 104 mmol/L (ref 98–107)
CO2: 23.3 mmol/L (ref 20.0–31.0)
CREATININE: 1.2 mg/dL — ABNORMAL HIGH
EGFR CKD-EPI (2021) FEMALE: 47 mL/min/{1.73_m2} — ABNORMAL LOW (ref >=60)
GLUCOSE RANDOM: 102 mg/dL — ABNORMAL HIGH (ref 70–99)
POTASSIUM: 3.8 mmol/L (ref 3.4–4.8)
PROTEIN TOTAL: 6.3 g/dL (ref 5.7–8.2)
SODIUM: 136 mmol/L (ref 135–145)

## 2022-12-02 LAB — CBC W/ AUTO DIFF
BASOPHILS ABSOLUTE COUNT: 0 10*9/L (ref 0.0–0.1)
BASOPHILS RELATIVE PERCENT: 0.8 %
EOSINOPHILS ABSOLUTE COUNT: 0.1 10*9/L (ref 0.0–0.5)
EOSINOPHILS RELATIVE PERCENT: 1 %
HEMATOCRIT: 31.3 % — ABNORMAL LOW (ref 34.0–44.0)
HEMOGLOBIN: 10.3 g/dL — ABNORMAL LOW (ref 11.3–14.9)
LYMPHOCYTES ABSOLUTE COUNT: 0.9 10*9/L — ABNORMAL LOW (ref 1.1–3.6)
LYMPHOCYTES RELATIVE PERCENT: 15.8 %
MEAN CORPUSCULAR HEMOGLOBIN CONC: 33 g/dL (ref 32.0–36.0)
MEAN CORPUSCULAR HEMOGLOBIN: 27.8 pg (ref 25.9–32.4)
MEAN CORPUSCULAR VOLUME: 84 fL (ref 77.6–95.7)
MEAN PLATELET VOLUME: 8.6 fL (ref 6.8–10.7)
MONOCYTES ABSOLUTE COUNT: 0.6 10*9/L (ref 0.3–0.8)
MONOCYTES RELATIVE PERCENT: 11.5 %
NEUTROPHILS ABSOLUTE COUNT: 3.8 10*9/L (ref 1.8–7.8)
NEUTROPHILS RELATIVE PERCENT: 70.9 %
PLATELET COUNT: 225 10*9/L (ref 150–450)
RED BLOOD CELL COUNT: 3.73 10*12/L — ABNORMAL LOW (ref 3.95–5.13)
RED CELL DISTRIBUTION WIDTH: 15.8 % — ABNORMAL HIGH (ref 12.2–15.2)
WBC ADJUSTED: 5.4 10*9/L (ref 3.6–11.2)

## 2022-12-02 LAB — TACROLIMUS LEVEL, TROUGH: TACROLIMUS, TROUGH: 4.4 ng/mL — ABNORMAL LOW (ref 5.0–15.0)

## 2022-12-02 NOTE — Unmapped (Signed)
[facility]     Type of visit: face to face    Are you located in [place]? (for virtual visits only) N/A    Reason for visit: Follow up    Questions / Concerns that need to be addressed:     Screening BP     Omron BPs (complete if screening BP has a systolic  > 130 or diastolic > 80)  BP#1 144/48   BP#2 152/41  BP#3 131/46    Average BP 142/45 P 62  (please note this as a comment in vitals)     PTHomeBP     HCDM reviewed and updated in Epic:    We are working to make sure all of our patients??? wishes are updated in Epic and part of that is documenting a Environmental health practitioner for each patient  A Health Care Decision Maker is someone you choose who can make health care decisions for you if you are not able - who would you most want to do this for you????  was updated.    HCDM (other legal document): Ellsworth Lennox - Son - [phone]    [facility] (patient stated preference): Lacey Jensen - Daughter - [phone]    BPAs completed:  PHQ2, AUDIT - Alcohol Screen, HARK - Interpersonal Screen, and Falls Risk - adults 65+    COVID-19 Vaccine Summary  Which COVID-19 Vaccine was administered  Moderna  Type:  Complete  Dates Given:          Date last mAB infusion:  07/17/2020            Immunization History   Administered Date(s) Administered    COVID-19 VACC,(JANSSEN)(PF) 01/11/2020    COVID-19 VACCINE,MRNA(MODERNA)(PF) 10/02/2020    INFLUENZA QUAD ADJUVANTED 54YR UP(FLUAD) 07/02/2021    INFLUENZA TIV (TRI) PF (IM) 08/30/2006, 09/14/2007, 08/14/2009, 08/26/2011, 07/27/2012    Influenza TRI (IIV3) 4+yrs PF 08/15/2013    Influenza Vaccine Quad(IM)6 MO-Adult(PF) 07/04/2014, 07/23/2015, 10/06/2016, 07/27/2017, 06/28/2018, 07/12/2019, 10/02/2020    PNEUMOCOCCAL POLYSACCHARIDE 23-VALENT 06/22/2003, 08/12/2015    Pneumococcal Conjugate 13-Valent 09/26/2013    Pneumococcal Conjugate 20-valent 03/21/2021    SHINGRIX-ZOSTER VACCINE (HZV),RECOMBINANT,ADJUVANTED(IM) 01/12/2018, 06/17/2018 __________________________________________________________________________________________    SCREENINGS COMPLETED IN FLOWSHEETS    HARK Screening       AUDIT       PHQ2       PHQ9          P4 Suicidality Screener                GAD7       COPD Assessment       Falls Risk  Falls Risk  Have you fallen in the past year?: No  Do you feel unsteady when standing or walking?: No

## 2022-12-02 NOTE — Unmapped (Addendum)
Make an appointment with Dr. Toni Arthurs in May:  ?[phone].      Here is an inexpensive blood pressure monitor at [email or web address]    [email or web address]

## 2022-12-02 NOTE — Unmapped (Signed)
Internal Medicine Clinic Visit    Reason for visit: Follow-up multiple issues below    A/P:        Sheree was seen today for follow-up.    Diagnoses and all orders for this visit:    Essential hypertension (RAF-HCC)  -     Home Medical Equipment  We have agreed that she will get a blood pressure monitor for her upper arm.  And she will bring readings to her next visits.  No changes to her therapy today.  Osteoporosis, unspecified osteoporosis type, unspecified pathological fracture presence  -     Dexa Bone Density Skeletal; Future  -     Dexa Bone Density Skeletal; Future  Last DEXA scan was over 5 years ago.  She has been on alendronate.  Will recheck and see how she is doing.  She may be due for an alendronate vacation.  Kidney transplant 05/17/2013  -     Tacrolimus Level, Trough; Future  -     CBC w/ Differential; Future  -     Comprehensive Metabolic Panel; Future  -     Home Medical Equipment  Her creatinine today was excellent with an EGFR of 47 which is as high as it has been in several years.  Other blood tests are okay.  I do note that she has a mild anemia with a hemoglobin of 10.3.  This is not too far from where she has been in the past but we will continue to monitor.      I personally spent 28 minutes face-to-face and non-face-to-face in the care of this patient, which includes all pre, intra, and post visit time on the date of service.    __________________________________________________________    HPI:    77 y.o. female here for follow-up.  Overall she is doing well.  She notes that she had the flu in December but is fully recovered at this time.  She does not want a flu shot or any more COVID shots.    She has been struggling emotionally lately because her dog is 8 years old and beginning to have geriatric conditions.  She is very attached to this dog.    She has been measuring her blood pressure at home with a wrist cuff and it is generally runs around 123/62.  Looking back in the clinic her blood pressure tends to be higher here.  Today it was 145/54 when I remeasured it.    She does not have a current follow-up appointment scheduled with Dr. Toni Arthurs so I given her the phone number to call.  __________________________________________________________      Medications:  See below.    __________________________________________________________    Physical Exam:   Vital Signs:  Vitals:    12/02/22 0925   BP: 142/45   BP Site: L Arm   BP Position: Sitting   BP Cuff Size: Small   Resp: 18   Temp: 35.7 ??C (96.3 ??F)   TempSrc: Temporal   SpO2: 99%   Weight: 49.8 kg (109 lb 12.8 oz)   Height: 154 cm (5' 0.63)       Gen: Well appearing, NAD  HEENT: No cervical lymphadenopathy, OP clear. No thyromegaly  CV: RRR, no murmurs  Pulm: CTA bilaterally, no crackles or wheezes  GI: abdomen soft, NTND, normal BS. No HSM.  Pulses: 2+ radial  Ext: No edema         Your Medication List  Accurate as of December 02, 2022  3:19 PM. If you have any questions, ask your nurse or doctor.                CONTINUE taking these medications      alendronate 70 MG tablet  Commonly known as: FOSAMAX  Take 1 tablet (70 mg total) by mouth every seven (7) days.     amlodipine 5 MG tablet  Commonly known as: NORVASC  Take 1 tablet (5 mg total) by mouth nightly.     aspirin 81 MG chewable tablet  Chew 1 tablet (81 mg total) daily.     atorvastatin 40 MG tablet  Commonly known as: LIPITOR  Take 1 tablet by mouth every evening     cholecalciferol (vitamin D3 25 mcg (1,000 units)) 1,000 unit (25 mcg) tablet  Take 1 tablet (25 mcg total) by mouth daily.     famotidine 20 MG tablet  Commonly known as: PEPCID  TAKE 1 TABLET (20 MG TOTAL) BY MOUTH TWO (2) TIMES A DAY AS NEEDED FOR HEARTBURN.     losartan 50 MG tablet  Commonly known as: COZAAR  Take 1 tablet (50 mg total) by mouth two (2) times a day.     MYFORTIC 180 mg EC tablet  Generic drug: mycophenolate  Take 2 tablets (360 mg total) by mouth two (2) times a day.     tacrolimus 0.5 MG capsule  Commonly known as: PROGRAF  Take 3 capsules (1.5 mg total) by mouth in the morning AND 2 capsules (1 mg total) in the evening.     traZODone 50 MG tablet  Commonly known as: DESYREL  Take 1 tablet (50 mg total) by mouth nightly.     triamterene-hydroCHLOROthiazide 37.5-25 mg per capsule  Commonly known as: DYAZIDE  Take 1 capsule by mouth every morning.                This note was dictated using voice recognition software.  Please forgive typos.

## 2022-12-03 NOTE — Unmapped (Signed)
Addended by: Dolores Hoose on: 12/02/2022 04:55 PM     Modules accepted: Level of Service

## 2022-12-07 MED FILL — TACROLIMUS 0.5 MG CAPSULE, IMMEDIATE-RELEASE: ORAL | 30 days supply | Qty: 150 | Fill #3

## 2022-12-07 MED FILL — MYFORTIC 180 MG TABLET,DELAYED RELEASE: ORAL | 30 days supply | Qty: 120 | Fill #1

## 2022-12-07 MED FILL — CHOLECALCIFEROL (VITAMIN D3) 25 MCG (1,000 UNIT) TABLET: ORAL | 90 days supply | Qty: 90 | Fill #2

## 2022-12-07 MED FILL — ALENDRONATE 70 MG TABLET: ORAL | 84 days supply | Qty: 12 | Fill #1

## 2022-12-07 MED FILL — ATORVASTATIN 40 MG TABLET: ORAL | 90 days supply | Qty: 90 | Fill #1

## 2022-12-29 NOTE — Unmapped (Signed)
[facility] Refill Coordination Note    Specialty Medication(s) to be Shipped:   Transplant: Myfortic 180mg  and tacrolimus 0.5mg     Other medication(s) to be shipped:  triamterene-hctz     Sophia Davis, DOB: 12/02/1945  Phone: [phone] (home)       All above HIPAA information was verified with patient.     Was a Nurse, learning disability used for this call? No    Completed refill call assessment today to schedule patient's medication shipment from the [facility] [phone]).  All relevant notes have been reviewed.     Specialty medication(s) and dose(s) confirmed: Regimen is correct and unchanged.   Changes to medications: Sophia Davis reports no changes at this time.  Changes to insurance: No  New side effects reported not previously addressed with a pharmacist or physician: None reported  Questions for the pharmacist: No    Confirmed patient received a Conservation officer, historic buildings and a Surveyor, mining with first shipment. The patient will receive a drug information handout for each medication shipped and additional FDA Medication Guides as required.       DISEASE/MEDICATION-SPECIFIC INFORMATION        N/A    SPECIALTY MEDICATION ADHERENCE     Medication Adherence    Patient reported X missed doses in the last month: 0  Specialty Medication: MYFORTIC 180 mg EC tablet (mycophenolate)  Patient is on additional specialty medications: Yes  Additional Specialty Medications: tacrolimus 0.5 MG capsule (PROGRAF)  Patient Reported Additional Medication X Missed Doses in the Last Month: 0  Patient is on more than two specialty medications: No  Adherence tools used: patient uses a pill box to manage medications              Were doses missed due to medication being on hold? No    Myfortic 180 mg: 10 days of medicine on hand   Tacrolimus  0.5 mg: 10 days of medicine on hand     REFERRAL TO PHARMACIST     Referral to the pharmacist: Not needed      [facility]     Shipping address confirmed in Epic. Patient was notified of new phone menu : No    Delivery Scheduled: Yes, Expected medication delivery date: 01/04/23.     Medication will be delivered via Same Day Courier to the prescription address in Epic WAM.    [name]   [facility] Specialty Technician

## 2023-01-04 MED FILL — TACROLIMUS 0.5 MG CAPSULE, IMMEDIATE-RELEASE: ORAL | 30 days supply | Qty: 150 | Fill #4

## 2023-01-04 MED FILL — MYFORTIC 180 MG TABLET,DELAYED RELEASE: ORAL | 30 days supply | Qty: 120 | Fill #2

## 2023-01-04 MED FILL — TRIAMTERENE 37.5 MG-HYDROCHLOROTHIAZIDE 25 MG CAPSULE: ORAL | 90 days supply | Qty: 90 | Fill #6

## 2023-01-27 MED ORDER — LOSARTAN 50 MG TABLET
ORAL_TABLET | Freq: Two times a day (BID) | ORAL | 2 refills | 90 days | Status: CP
Start: 2023-01-27 — End: 2023-11-18
  Filled 2023-02-02: qty 180, 90d supply, fill #0

## 2023-01-27 NOTE — Unmapped (Signed)
[facility] Refill Coordination Note    Specialty Medication(s) to be Shipped:   Transplant: Myfortic 180 mg and tacrolimus 0.5mg     Other medication(s) to be shipped:  amlodipine , losartan , trazodone      Sophia Davis, DOB: Jan 11, 1946  Phone: [phone] (home)       All above HIPAA information was verified with patient.     Was a Nurse, learning disability used for this call? No    Completed refill call assessment today to schedule patient's medication shipment from the [facility] [phone]).  All relevant notes have been reviewed.     Specialty medication(s) and dose(s) confirmed: Regimen is correct and unchanged.   Changes to medications: [name] reports no changes at this time.  Changes to insurance: No  New side effects reported not previously addressed with a pharmacist or physician: None reported  Questions for the pharmacist: No    Confirmed patient received a Conservation officer, historic buildings and a Surveyor, mining with first shipment. The patient will receive a drug information handout for each medication shipped and additional FDA Medication Guides as required.       DISEASE/MEDICATION-SPECIFIC INFORMATION        N/A    SPECIALTY MEDICATION ADHERENCE     Medication Adherence    Patient reported X missed doses in the last month: 0  Specialty Medication: MYFORTIC 180 mg EC tablet (mycophenolate)  Patient is on additional specialty medications: Yes  Additional Specialty Medications: tacrolimus 0.5 MG capsule (PROGRAF)  Patient Reported Additional Medication X Missed Doses in the Last Month: 0  Patient is on more than two specialty medications: No  Adherence tools used: patient uses a pill box to manage medications              Were doses missed due to medication being on hold? No    Myfortic 180 mg: 10 days of medicine on hand   tacrolimus 0.5 mg: 10 days of medicine on hand       REFERRAL TO PHARMACIST     Referral to the pharmacist: Not needed      [facility]     Shipping address confirmed in Epic.       Delivery Scheduled: Yes, Expected medication delivery date: 02/02/23.     Medication will be delivered via Same Day Courier to the prescription address in Epic WAM.    [name]   [facility] Specialty Technician

## 2023-02-02 MED FILL — AMLODIPINE 5 MG TABLET: ORAL | 90 days supply | Qty: 90 | Fill #2

## 2023-02-02 MED FILL — TACROLIMUS 0.5 MG CAPSULE, IMMEDIATE-RELEASE: ORAL | 30 days supply | Qty: 150 | Fill #5

## 2023-02-02 MED FILL — MYFORTIC 180 MG TABLET,DELAYED RELEASE: ORAL | 30 days supply | Qty: 120 | Fill #3

## 2023-02-02 MED FILL — TRAZODONE 50 MG TABLET: ORAL | 90 days supply | Qty: 90 | Fill #1

## 2023-02-17 DIAGNOSIS — R799 Abnormal finding of blood chemistry, unspecified: Principal | ICD-10-CM

## 2023-02-17 DIAGNOSIS — N186 End stage renal disease: Principal | ICD-10-CM

## 2023-02-17 DIAGNOSIS — Z940 Kidney transplant status: Principal | ICD-10-CM

## 2023-02-17 DIAGNOSIS — D849 Immunodeficiency, unspecified: Principal | ICD-10-CM

## 2023-02-17 DIAGNOSIS — E559 Vitamin D deficiency, unspecified: Principal | ICD-10-CM

## 2023-02-23 NOTE — Unmapped (Signed)
[facility] Refill Coordination Note    Specialty Medication(s) to be Shipped:   Transplant: Myfortic 180mg  and tacrolimus 0.5mg     Other medication(s) to be shipped:  alendrnate     Sophia Davis, DOB: 1946-01-29  Phone: [phone] (home)       All above HIPAA information was verified with patient.     Was a Nurse, learning disability used for this call? No    Completed refill call assessment today to schedule patient's medication shipment from the [facility] [phone]).  All relevant notes have been reviewed.     Specialty medication(s) and dose(s) confirmed: Regimen is correct and unchanged.   Changes to medications: Sophia Davis reports no changes at this time.  Changes to insurance: No  New side effects reported not previously addressed with a pharmacist or physician: None reported  Questions for the pharmacist: No    Confirmed patient received a Conservation officer, historic buildings and a Surveyor, mining with first shipment. The patient will receive a drug information handout for each medication shipped and additional FDA Medication Guides as required.       DISEASE/MEDICATION-SPECIFIC INFORMATION        N/A    SPECIALTY MEDICATION ADHERENCE     Medication Adherence    Patient reported X missed doses in the last month: 0  Specialty Medication: MYFORTIC 180 mg EC tablet (mycophenolate)  Patient is on additional specialty medications: Yes  Additional Specialty Medications: tacrolimus 0.5 MG capsule (PROGRAF)  Patient Reported Additional Medication X Missed Doses in the Last Month: 0  Patient is on more than two specialty medications: No  Adherence tools used: patient uses a pill box to manage medications              Were doses missed due to medication being on hold? No    Myfortic 180 mg: 10 days of medicine on hand   tacrolimus 0.5 mg: 10 days of medicine on hand       REFERRAL TO PHARMACIST     Referral to the pharmacist: Not needed      [facility]     Shipping address confirmed in Epic. Delivery Scheduled: Yes, Expected medication delivery date: 03/01/23.     Medication will be delivered via Same Day Courier to the prescription address in Epic WAM.    [name]   [facility] Specialty Technician

## 2023-03-01 MED FILL — MYFORTIC 180 MG TABLET,DELAYED RELEASE: ORAL | 30 days supply | Qty: 120 | Fill #4

## 2023-03-01 MED FILL — ALENDRONATE 70 MG TABLET: ORAL | 84 days supply | Qty: 12 | Fill #2

## 2023-03-01 MED FILL — TACROLIMUS 0.5 MG CAPSULE, IMMEDIATE-RELEASE: ORAL | 30 days supply | Qty: 150 | Fill #6

## 2023-03-05 ENCOUNTER — Encounter: Admit: 2023-03-05 | Discharge: 2023-03-06 | Payer: MEDICARE | Attending: Nephrology | Primary: Nephrology

## 2023-03-05 ENCOUNTER — Ambulatory Visit: Admit: 2023-03-05 | Discharge: 2023-03-06 | Payer: MEDICARE | Attending: Nephrology | Primary: Nephrology

## 2023-03-05 DIAGNOSIS — Z940 Kidney transplant status: Principal | ICD-10-CM

## 2023-03-05 DIAGNOSIS — R197 Diarrhea, unspecified: Principal | ICD-10-CM

## 2023-03-05 DIAGNOSIS — R799 Abnormal finding of blood chemistry, unspecified: Principal | ICD-10-CM

## 2023-03-05 DIAGNOSIS — D849 Immunodeficiency, unspecified: Principal | ICD-10-CM

## 2023-03-05 DIAGNOSIS — E559 Vitamin D deficiency, unspecified: Principal | ICD-10-CM

## 2023-03-05 LAB — LIPID PANEL
CHOLESTEROL/HDL RATIO SCREEN: 3.5 (ref 1.0–4.5)
CHOLESTEROL: 77 mg/dL (ref <=200)
HDL CHOLESTEROL: 22 mg/dL — ABNORMAL LOW (ref 40–60)
LDL CHOLESTEROL CALCULATED: 36 mg/dL — ABNORMAL LOW (ref 40–99)
NON-HDL CHOLESTEROL: 55 mg/dL — ABNORMAL LOW (ref 70–130)
TRIGLYCERIDES: 94 mg/dL (ref 0–150)
VLDL CHOLESTEROL CAL: 18.8 mg/dL (ref 11–41)

## 2023-03-05 LAB — BASIC METABOLIC PANEL
ANION GAP: 10 mmol/L (ref 5–14)
BLOOD UREA NITROGEN: 41 mg/dL — ABNORMAL HIGH (ref 9–23)
BUN / CREAT RATIO: 20
CALCIUM: 9.1 mg/dL (ref 8.7–10.4)
CHLORIDE: 105 mmol/L (ref 98–107)
CO2: 18.9 mmol/L — ABNORMAL LOW (ref 20.0–31.0)
CREATININE: 2.05 mg/dL — ABNORMAL HIGH
EGFR CKD-EPI (2021) FEMALE: 24 mL/min/{1.73_m2} — ABNORMAL LOW (ref >=60)
GLUCOSE RANDOM: 99 mg/dL (ref 70–179)
POTASSIUM: 5.2 mmol/L — ABNORMAL HIGH (ref 3.4–4.8)
SODIUM: 134 mmol/L — ABNORMAL LOW (ref 135–145)

## 2023-03-05 LAB — CBC W/ AUTO DIFF
BASOPHILS ABSOLUTE COUNT: 0 10*9/L (ref 0.0–0.1)
BASOPHILS RELATIVE PERCENT: 0.3 %
EOSINOPHILS ABSOLUTE COUNT: 0 10*9/L (ref 0.0–0.5)
EOSINOPHILS RELATIVE PERCENT: 0.2 %
HEMATOCRIT: 31.2 % — ABNORMAL LOW (ref 34.0–44.0)
HEMOGLOBIN: 10.4 g/dL — ABNORMAL LOW (ref 11.3–14.9)
LYMPHOCYTES ABSOLUTE COUNT: 0.7 10*9/L — ABNORMAL LOW (ref 1.1–3.6)
LYMPHOCYTES RELATIVE PERCENT: 8.5 %
MEAN CORPUSCULAR HEMOGLOBIN CONC: 33.5 g/dL (ref 32.0–36.0)
MEAN CORPUSCULAR HEMOGLOBIN: 27.7 pg (ref 25.9–32.4)
MEAN CORPUSCULAR VOLUME: 82.6 fL (ref 77.6–95.7)
MEAN PLATELET VOLUME: 8 fL (ref 6.8–10.7)
MONOCYTES ABSOLUTE COUNT: 0.6 10*9/L (ref 0.3–0.8)
MONOCYTES RELATIVE PERCENT: 6.7 %
NEUTROPHILS ABSOLUTE COUNT: 7.2 10*9/L (ref 1.8–7.8)
NEUTROPHILS RELATIVE PERCENT: 84.3 %
PLATELET COUNT: 401 10*9/L (ref 150–450)
RED BLOOD CELL COUNT: 3.77 10*12/L — ABNORMAL LOW (ref 3.95–5.13)
RED CELL DISTRIBUTION WIDTH: 14.3 % (ref 12.2–15.2)
WBC ADJUSTED: 8.6 10*9/L (ref 3.6–11.2)

## 2023-03-05 LAB — HEPATIC FUNCTION PANEL
ALBUMIN: 3 g/dL — ABNORMAL LOW (ref 3.4–5.0)
ALKALINE PHOSPHATASE: 91 U/L (ref 46–116)
ALT (SGPT): 10 U/L (ref 10–49)
AST (SGOT): 17 U/L (ref <=34)
BILIRUBIN DIRECT: 0.2 mg/dL (ref 0.00–0.30)
BILIRUBIN TOTAL: 0.4 mg/dL (ref 0.3–1.2)
PROTEIN TOTAL: 6.2 g/dL (ref 5.7–8.2)

## 2023-03-05 LAB — HEMOGLOBIN A1C
ESTIMATED AVERAGE GLUCOSE: 114 mg/dL
HEMOGLOBIN A1C: 5.6 % (ref 4.8–5.6)

## 2023-03-05 LAB — PHOSPHORUS: PHOSPHORUS: 3.9 mg/dL (ref 2.4–5.1)

## 2023-03-05 LAB — MAGNESIUM: MAGNESIUM: 1.5 mg/dL — ABNORMAL LOW (ref 1.6–2.6)

## 2023-03-05 MED ADMIN — sodium chloride 0.9% (NS) bolus 500 mL: 500 mL | INTRAVENOUS | @ 19:00:00 | Stop: 2023-03-05

## 2023-03-05 MED ADMIN — sodium chloride 0.9% (NS) bolus 1,000 mL: 1000 mL | INTRAVENOUS | @ 18:00:00 | Stop: 2023-03-05

## 2023-03-05 NOTE — Unmapped (Signed)
1503 MD ordered additional NS bolus. Running without problem.  Call bell in reach.   1410 NS 500 ml bolus complete. PIV left in place pending MD assessment.

## 2023-03-05 NOTE — Unmapped (Signed)
Saw patient in clinic.     +Diarrhea and fatigue for past 2 weeks.    +stomach feels sick on the inside    +lightheaded intermittently for 2 weeks    Not drinking much, 1 bottle of water a day    Appetite poor, not eating a full meal a day. Feels hungry but has to go the bathroom right away    Has not had labs yet but did take her meds today    Dog passed away 2.15 and now has new puppy Abby Grace    BP at home 110-120/60    Denies HA, CP, heart palpitations, SOB, n/v, urinary problems, swelling, numbness/tingling, tremors, or fevers

## 2023-03-05 NOTE — Unmapped (Addendum)
hold myfortic for now.  stool sample for c. difficile and GI pathogen panel  hold losartan and dyazide  monitor blood pressure readings; do not take amlodipine if BP < 100/60 mmHg or if dizzy or lightheaded. please call me [phone] or your transplant coordinator [phone] next week to let us know how you are feeling. please call  kidney transplant coordinator on call [phone] if problems at night or on weekend.  please repeat labs next week in [place]

## 2023-03-05 NOTE — Unmapped (Signed)
1335 PIV inserted as ordered. Good blood return and flushed well.  NS bolus fluids running. Patient daughter in law present. Call bell in place, patient resting with feet up.

## 2023-03-05 NOTE — Unmapped (Signed)
1531  PIV discontinued without problem.  Catheter tip intact.  Pressure bandage placed. Instruction on removing within 20 mins. Patient and daughter in law verbalized understanding.

## 2023-03-06 LAB — CMV DNA, QUANTITATIVE, PCR
CMV QUANT LOG(10): 3.93 {Log_IU}/mL — ABNORMAL HIGH (ref <0.00)
CMV QUANT: 8540 [IU]/mL — ABNORMAL HIGH (ref <0)
CMV VIRAL LD: DETECTED — AB

## 2023-03-06 LAB — BK VIRUS QUANTITATIVE PCR, BLOOD: BK BLOOD RESULT: NOT DETECTED

## 2023-03-06 LAB — TACROLIMUS LEVEL, TROUGH: TACROLIMUS, TROUGH: 13.4 ng/mL (ref 5.0–15.0)

## 2023-03-08 DIAGNOSIS — B259 Cytomegaloviral disease, unspecified: Principal | ICD-10-CM

## 2023-03-08 MED ORDER — VALGANCICLOVIR 450 MG TABLET
ORAL_TABLET | Freq: Every day | ORAL | 3 refills | 90 days | Status: CP
Start: 2023-03-08 — End: 2024-03-07

## 2023-03-08 NOTE — Unmapped (Signed)
Reviewed CMV level with Dr. Toni Arthurs- order for Valcyte 450 mg BID, hold mycophenolate and repeat labs next week.     Spoke with Steward Drone, let her know that her labs were positive for CMV, she was notified that she would need to start taking valcyte 450 mg twice per day, she will go pick up today. Pt agrees to get labs next week as well.    Pt with no other questions or needs at this time.

## 2023-03-09 LAB — VITAMIN D 25 HYDROXY: VITAMIN D, TOTAL (25OH): 27.5 ng/mL (ref 20.0–80.0)

## 2023-03-11 LAB — HLA DS POST TRANSPLANT
ANTI-DONOR DRW #1 MFI: 975 MFI
ANTI-DONOR DRW #2 MFI: 292 MFI
ANTI-DONOR HLA-A #1 MFI: 7 MFI
ANTI-DONOR HLA-B #1 MFI: 0 MFI
ANTI-DONOR HLA-B #2 MFI: 91 MFI
ANTI-DONOR HLA-C #1 MFI: 0 MFI
ANTI-DONOR HLA-C #2 MFI: 0 MFI
ANTI-DONOR HLA-DQB #1 MFI: 246 MFI
ANTI-DONOR HLA-DQB #2 MFI: 139 MFI
ANTI-DONOR HLA-DR #1 MFI: 0 MFI
ANTI-DONOR HLA-DR #2 MFI: 74 MFI

## 2023-03-11 LAB — FSAB CLASS 1 ANTIBODY SPECIFICITY: HLA CLASS 1 ANTIBODY RESULT: POSITIVE

## 2023-03-11 LAB — FSAB CLASS 2 ANTIBODY SPECIFICITY: HLA CL2 AB RESULT: POSITIVE

## 2023-03-11 LAB — VITAMIN D 1,25 DIHYDROXY: VITAMIN D 1,25-DIHYDROXY: 36 pg/mL

## 2023-03-14 NOTE — Unmapped (Signed)
[facility] transplant nephrology clinic visit    assessment and plan  1. kidney transplant 05/17/13. +prerenal acute kidney injury baseline creatinine 1.2-1.4 mg/dl. stage iiib chronic kidney disease. no proteinuria. no donor specific hla ab detected '23. trugraf kidney biomarker normal.  2. immunosuppression. hold mycophenolate. tacrolimus 12hr lvl 4-7 ng/ml.  3. hypertension. hold losartan and diuretic antihypertensive medication. blood pressure goal < 130/80 mmhg.  4. cmv viremia. hold mycophenolate. valganciclovir 450mg  bid.  5. health maintenance. influenza '21. pcv13 pneumococcal '20 administered. zoster '19. covid-19 janssen/moderna vaccine '21. mammogram '21. gyn exam '13/post hysterectomy. cystoscopy '14. colonoscopy '20. dermatology '21. kidney ultrasound '22.    history of present illness    [name] is a 77 year old woman seen in follow up post kidney transplant 05/17/2013. +fatigue. +myalgias. +diarrhea with diminished appetite.    past medical hx:   1. deceased donor kidney transplant 05/17/2013. mpo-anca+ chronic gn. alemtuzumab induction. baseline creatinine 1.2-1.4 mg/dl.  2. mpo-anca+ vasculitis/granulomatosis with polyangiitis. hx pulmonary hemorrhage. trtmt plasma exchange, iv methylprednisolone, iv/po cyclophosphamide x total 9m '03-'07; rituximab 1gm x2 '07.  3. hypertension   4. coronary artery disease s/p pci/stent '97. nl cards stress '13: lvef > 65%. no myocardial ischemia   5. hyperlipidemia  6. osteoporosis  7. secondary/tertiary hyperparathyroidism    past surgical hx: total abdominal hysterectomy '98. 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 kidney transplant '14. right hand 3rd/4th trigger finger release '16.    allergies: no known drug allergies    medications: tacrolimus 1.5mg /1mg  am/pm, mycophenolate sodium 360mg  bid, losartan 50mg  bid, amlodipine 2.5mg  q.pm, triamterene/hydrochlorothiazide 37.5mg /25mg  daily, atorvastatin 40mg  daily, aspirin 81mg  daily, vitamin d 1,000u daily, famotidine 20mg  prn, alendronate 70mg  q.week.    soc hx: widowed x3 children. distant smoking hx; none > 40 yrs.    physical exam: t96.1 p57 bp149/55 wt51.3kg bmi21.3. wd/wn woman appropriate affect and mood. nl sclera anicteric. mmm no thrush. neck supple no palpable ln. heart rrr nl s1s2 no m/r/g. lungs clear bilateral. abdomen soft nt/nd. no lower ext edema. msk no synovitis or tophi. skin no rash. neuro alert oriented non focal exam.

## 2023-03-16 ENCOUNTER — Other Ambulatory Visit
Admission: EM | Admit: 2023-03-16 | Discharge: 2023-03-16 | Disposition: A | Discharge status: Home / Self Care | Payer: Medicare Other | Attending: Nephrology | Admitting: Nephrology

## 2023-03-16 DIAGNOSIS — Z09 Encounter for follow-up examination after completed treatment for conditions other than malignant neoplasm: Secondary | ICD-10-CM | POA: Insufficient documentation

## 2023-03-16 DIAGNOSIS — D631 Anemia in chronic kidney disease: Secondary | ICD-10-CM | POA: Diagnosis not present

## 2023-03-16 DIAGNOSIS — Z9483 Pancreas transplant status: Secondary | ICD-10-CM | POA: Diagnosis not present

## 2023-03-16 DIAGNOSIS — E559 Vitamin D deficiency, unspecified: Secondary | ICD-10-CM | POA: Diagnosis not present

## 2023-03-16 DIAGNOSIS — Z789 Other specified health status: Secondary | ICD-10-CM | POA: Diagnosis not present

## 2023-03-16 DIAGNOSIS — Z79899 Other long term (current) drug therapy: Secondary | ICD-10-CM | POA: Insufficient documentation

## 2023-03-16 DIAGNOSIS — Z114 Encounter for screening for human immunodeficiency virus [HIV]: Secondary | ICD-10-CM | POA: Insufficient documentation

## 2023-03-16 DIAGNOSIS — Z940 Kidney transplant status: Secondary | ICD-10-CM | POA: Insufficient documentation

## 2023-03-16 DIAGNOSIS — B259 Cytomegaloviral disease, unspecified: Secondary | ICD-10-CM | POA: Diagnosis not present

## 2023-03-16 DIAGNOSIS — E1129 Type 2 diabetes mellitus with other diabetic kidney complication: Secondary | ICD-10-CM | POA: Insufficient documentation

## 2023-03-16 DIAGNOSIS — N390 Urinary tract infection, site not specified: Secondary | ICD-10-CM | POA: Insufficient documentation

## 2023-03-16 LAB — CBC WITH DIFFERENTIAL/PLATELET
Abs Immature Granulocytes: 0.05 10*3/uL (ref 0.00–0.07)
Basophils Absolute: 0 10*3/uL (ref 0.0–0.1)
Basophils Relative: 0 %
Eosinophils Absolute: 0 10*3/uL (ref 0.0–0.5)
Eosinophils Relative: 1 %
HCT: 29.2 % — ABNORMAL LOW (ref 36.0–46.0)
Hemoglobin: 9 g/dL — ABNORMAL LOW (ref 12.0–15.0)
Immature Granulocytes: 1 %
Lymphocytes Relative: 25 %
Lymphs Abs: 1.4 10*3/uL (ref 0.7–4.0)
MCH: 26.2 pg (ref 26.0–34.0)
MCHC: 30.8 g/dL (ref 30.0–36.0)
MCV: 85.1 fL (ref 80.0–100.0)
Monocytes Absolute: 0.4 10*3/uL (ref 0.1–1.0)
Monocytes Relative: 7 %
Neutro Abs: 3.7 10*3/uL (ref 1.7–7.7)
Neutrophils Relative %: 66 %
Platelets: 428 10*3/uL — ABNORMAL HIGH (ref 150–400)
RBC: 3.43 MIL/uL — ABNORMAL LOW (ref 3.87–5.11)
RDW: 13.9 % (ref 11.5–15.5)
WBC: 5.6 10*3/uL (ref 4.0–10.5)
nRBC: 0 % (ref 0.0–0.2)

## 2023-03-16 LAB — PHOSPHORUS: Phosphorus: 3.4 mg/dL (ref 2.5–4.6)

## 2023-03-16 LAB — BASIC METABOLIC PANEL
Anion gap: 6 (ref 5–15)
BUN: 21 mg/dL (ref 8–23)
CO2: 25 mmol/L (ref 22–32)
Calcium: 8.4 mg/dL — ABNORMAL LOW (ref 8.9–10.3)
Chloride: 108 mmol/L (ref 98–111)
Creatinine, Ser: 1.2 mg/dL — ABNORMAL HIGH (ref 0.44–1.00)
GFR, Estimated: 46 mL/min — ABNORMAL LOW (ref >60)
Glucose, Bld: 93 mg/dL (ref 70–99)
Potassium: 4.6 mmol/L (ref 3.5–5.1)
Sodium: 139 mmol/L (ref 135–145)

## 2023-03-16 LAB — MAGNESIUM: Magnesium: 1.6 mg/dL — ABNORMAL LOW (ref 1.7–2.4)

## 2023-03-17 LAB — CMV DNA, QUANTITATIVE, PCR
CMV DNA Quant: 710 IU/mL
Log10 CMV Qn DNA Pl: 2.851 log10 IU/mL

## 2023-03-18 LAB — TACROLIMUS LEVEL: Tacrolimus (FK506) - LabCorp: 5.5 ng/mL (ref 2.0–20.0)

## 2023-03-23 NOTE — Unmapped (Signed)
[facility] Refill Coordination Note    Specialty Medication(s) to be Shipped:   tacrolimus 0.5 MG capsule (PROGRAF)    Other medication(s) to be shipped: No additional medications requested for fill at this time     Sophia Davis, DOB: 25-Feb-1946  Phone: [phone] (home)       All above HIPAA information was verified with patient.     Was a Nurse, learning disability used for this call? No    Completed refill call assessment today to schedule patient's medication shipment from the [facility] [phone]).  All relevant notes have been reviewed.     Specialty medication(s) and dose(s) confirmed: Regimen is correct and unchanged.   Changes to medications: Sophia Davis reports no changes at this time.  Changes to insurance: No  New side effects reported not previously addressed with a pharmacist or physician: None reported  Questions for the pharmacist: No    Confirmed patient received a Conservation officer, historic buildings and a Surveyor, mining with first shipment. The patient will receive a drug information handout for each medication shipped and additional FDA Medication Guides as required.       DISEASE/MEDICATION-SPECIFIC INFORMATION        N/A    SPECIALTY MEDICATION ADHERENCE     Medication Adherence    Patient reported X missed doses in the last month: 0  Specialty Medication: tacrolimus 0.5 MG capsule (PROGRAF)  Patient is on additional specialty medications: No  Adherence tools used: patient uses a pill box to manage medications              Were doses missed due to medication being on hold? No     tacrolimus 0.5 MG capsule (PROGRAF): 14 days of medicine on hand       REFERRAL TO PHARMACIST     Referral to the pharmacist: Not needed      [facility]     Shipping address confirmed in Epic.       Delivery Scheduled: Yes, Expected medication delivery date: 04/05/2023.     Medication will be delivered via Same Day Courier to the prescription address in Epic [place].    Lulie Hurd J Helane Gunther   [facility] Specialty Technician

## 2023-03-31 NOTE — Unmapped (Signed)
[facility] Clinical Intervention    Type of intervention: Narrow therapeutic index drug    Medication involved: tacrolimus    Problem identified: mfg change due to [place] alignment, clinic ok with switch    Intervention performed: pt aware of and ok with switch, knows to alert clinic on date of switch and that bloodwork may be needed    Follow-up needed: clinic aware, pt will stay on consistent mfg going forward    Approximate time spent: 5-10 minutes    Clinical evidence used to support intervention: FDA Orange Book    Result of the intervention: Prevention of an adverse drug event    Thad Ranger, PharmD   [facility] Specialty Pharmacist      [facility] Clinical Assessment & Refill Coordination Note    Sophia Davis, DOB: [birth year]  Phone: [phone] (home)     All above HIPAA information was verified with patient.     Was a Nurse, learning disability used for this call? No    Specialty Medication(s):   Transplant: tacrolimus 0.5mg   Mycophenolate 180mg - not taking at this time, verified per epic notes     Current Outpatient Medications   Medication Sig Dispense Refill    alendronate (FOSAMAX) 70 MG tablet Take 1 tablet (70 mg total) by mouth every seven (7) days. 12 tablet 2    amlodipine (NORVASC) 5 MG tablet Take 1 tablet (5 mg total) by mouth nightly. 180 tablet 3    aspirin 81 MG chewable tablet Chew 1 tablet (81 mg total) daily.      atorvastatin (LIPITOR) 40 MG tablet Take 1 tablet by mouth every evening 90 tablet 3    cholecalciferol, vitamin D3 25 mcg, 1,000 units,, 1,000 unit (25 mcg) tablet Take 1 tablet (25 mcg total) by mouth daily. 90 tablet 3    famotidine (PEPCID) 20 MG tablet TAKE 1 TABLET (20 MG TOTAL) BY MOUTH TWO (2) TIMES A DAY AS NEEDED FOR HEARTBURN. 180 tablet 3    tacrolimus (PROGRAF) 0.5 MG capsule Take 3 capsules (1.5 mg total) by mouth in the morning AND 2 capsules (1 mg total) in the evening. 150 capsule 11    traZODone (DESYREL) 50 MG tablet Take 1 tablet (50 mg total) by mouth nightly. 90 tablet 3    valGANciclovir (VALCYTE) 450 mg tablet Take 2 tablets (900 mg total) by mouth daily. 180 tablet 3     No current facility-administered medications for this visit.        Changes to medications: Sophia Davis reports no changes at this time.    No Known Allergies    Changes to allergies: No    SPECIALTY MEDICATION ADHERENCE     Tacrolimus 0.5mg   : 7 days of medicine on hand   Mycophenolate 180mg   : 15 days of medicine on hand but not currently taking      Medication Adherence    Patient reported X missed doses in the last month: 0  Specialty Medication: TACROLIMUS 0.5MG   Patient is on additional specialty medications: Yes  Additional Specialty Medications: Mycophenolate 180mg   Patient Reported Additional Medication X Missed Doses in the Last Month: 0  Adherence tools used: patient uses a pill box to manage medications          Specialty medication(s) dose(s) confirmed: Patient reports changes to the regimen as follows: holding mycophenolate, started valcyte but getting it from outside pharmacy      Are there  any concerns with adherence? No    Adherence counseling provided? Not needed    CLINICAL MANAGEMENT AND INTERVENTION      Clinical Benefit Assessment:    Do you feel the medicine is effective or helping your condition? Yes    Clinical Benefit counseling provided? Not needed    Adverse Effects Assessment:    Are you experiencing any side effects? No    Are you experiencing difficulty administering your medicine? No    Quality of Life Assessment:    Quality of Life    Rheumatology  Oncology  Dermatology  Cystic Fibrosis          How many days over the past month did your transplant  keep you from your normal activities? For example, brushing your teeth or getting up in the morning. 0    Have you discussed this with your provider? Not needed    Acute Infection Status:    Acute infections noted within Epic:  No active infections  Patient reported infection: None    Therapy Appropriateness:    Is therapy appropriate and patient progressing towards therapeutic goals? Yes, therapy is appropriate and should be continued    DISEASE/MEDICATION-SPECIFIC INFORMATION      N/A    Solid Organ Transplant: Not Applicable    PATIENT SPECIFIC NEEDS     Does the patient have any physical, cognitive, or cultural barriers? No    Is the patient high risk? No    Did the patient require a clinical intervention?  Yes see notes above    Does the patient require physician intervention or other additional services (i.e., nutrition, smoking cessation, social work)? No    SOCIAL DETERMINANTS OF HEALTH     At the [facility], we have learned that life circumstances - like trouble affording food, housing, utilities, or transportation can affect the health of many of our patients.   That is why we wanted to ask: are you currently experiencing any life circumstances that are negatively impacting your health and/or quality of life? No    Social Determinants of Health     Financial Resource Strain: Low Risk  (12/02/2022)    Overall Financial Resource Strain (CARDIA)     Difficulty of Paying Living Expenses: Not hard at all   Internet Connectivity: Not on file   Food Insecurity: No Food Insecurity (12/02/2022)    Hunger Vital Sign     Worried About Running Out of Food in the Last Year: Never true     Ran Out of Food in the Last Year: Never true   Tobacco Use: Low Risk  (03/05/2023)    Patient History     Smoking Tobacco Use: Never     Smokeless Tobacco Use: Never     Passive Exposure: Not on file   Housing/Utilities: Low Risk  (12/02/2022)    Housing/Utilities     Within the past 12 months, have you ever stayed: outside, in a car, in a tent, in an overnight shelter, or temporarily in someone else's home (i.e. couch-surfing)?: No     Are you worried about losing your housing?: No     Within the past 12 months, have you been unable to get utilities (heat, electricity) when it was really needed?: No   Alcohol Use: Not At Risk (12/02/2022)    Alcohol Use     How often do you have a drink containing alcohol?: Never     How many drinks containing alcohol do you have on  a typical day when you are drinking?: 1 - 2     How often do you have 5 or more drinks on one occasion?: Never   Transportation Needs: No Transportation Needs (12/02/2022)    PRAPARE - Transportation     Lack of Transportation (Medical): No     Lack of Transportation (Non-Medical): No   Substance Use: Low Risk  (10/02/2020)    Substance Use     Taken prescription drugs for non-medical reasons: Never     Taken illegal drugs: Never     Patient indicated they have taken drugs in the past year for non-medical reasons: Yes, [positive answer(s)]: Not on file   Health Literacy: Not on file   Physical Activity: Not on file   Interpersonal Safety: Not on file   Stress: Not on file   Intimate Partner Violence: Not At Risk (12/02/2022)    Humiliation, Afraid, Rape, and Kick questionnaire     Fear of Current or Ex-Partner: No     Emotionally Abused: No     Physically Abused: No     Sexually Abused: No   Depression: Not at risk (12/02/2022)    PHQ-2     PHQ-2 Score: 0   Social Connections: Not on file       Would you be willing to receive help with any of the needs that you have identified today? Not applicable       SHIPPING     Specialty Medication(s) to be Shipped:   N/a    Other medication(s) to be shipped: No additional medications requested for fill at this time     Changes to insurance: No    Delivery Scheduled:  delivery already set up by another team member      Medication will be delivered via  n/a  to the confirmed  n/a  address in [facility].    The patient will receive a drug information handout for each medication shipped and additional FDA Medication Guides as required.  Verified that patient has previously received a Conservation officer, historic buildings and a Surveyor, mining.    The patient or caregiver noted above participated in the development of this care plan and knows that they can request review of or adjustments to the care plan at any time.      All of the patient's questions and concerns have been addressed.    Thad Ranger, PharmD   [facility] Specialty Pharmacist

## 2023-04-05 MED FILL — TACROLIMUS 0.5 MG CAPSULE, IMMEDIATE-RELEASE: ORAL | 30 days supply | Qty: 150 | Fill #7

## 2023-04-12 DIAGNOSIS — Z940 Kidney transplant status: Principal | ICD-10-CM

## 2023-04-12 DIAGNOSIS — R609 Edema, unspecified: Principal | ICD-10-CM

## 2023-04-12 LAB — PHOSPHORUS: PHOSPHORUS: 3.4

## 2023-04-12 LAB — CMV DNA, QUANTITATIVE, PCR
CMV QUANT LOG(10): 2.851
CMV QUANT: 710

## 2023-04-12 MED ORDER — TRIAMTERENE 37.5 MG-HYDROCHLOROTHIAZIDE 25 MG TABLET
ORAL_TABLET | Freq: Every day | ORAL | 0 refills | 30 days | Status: CP
Start: 2023-04-12 — End: 2024-04-11

## 2023-04-12 NOTE — Unmapped (Signed)
Spoke with Sophia Davis, she reports bilateral lower leg edema and wanted to start taking dyazide again.   Reviewed with Dr. Toni Arthurs- please restart triamterene/hydrochlorothiazide 37.5/25 at 1/2 tablet daily. thank you     Sophia Davis was notified to take half a tablet, she will do this. Prescription was sent to local pharmacy.   Recent labs reviewed.   Sophia Davis with no other questions or concerns.

## 2023-05-04 DIAGNOSIS — Z940 Kidney transplant status: Principal | ICD-10-CM

## 2023-05-04 DIAGNOSIS — R609 Edema, unspecified: Principal | ICD-10-CM

## 2023-05-04 MED ORDER — TRIAMTERENE 37.5 MG-HYDROCHLOROTHIAZIDE 25 MG TABLET
ORAL_TABLET | 1 refills | 0 days | Status: CP
Start: 2023-05-04 — End: ?

## 2023-05-04 NOTE — Unmapped (Signed)
[facility] Refill Coordination Note    Specialty Medication(s) to be Shipped:   Transplant: tacrolimus 0.5mg     Other medication(s) to be shipped:  trazodone, Vit D3, atorvastatin     Sophia Davis, DOB: 05/16/1946  Phone: [phone] (home)       All above HIPAA information was verified with patient.     Was a Nurse, learning disability used for this call? No    Completed refill call assessment today to schedule patient's medication shipment from the [facility] [phone]).  All relevant notes have been reviewed.     Specialty medication(s) and dose(s) confirmed: Regimen is correct and unchanged.   Changes to medications: Laiana reports no changes at this time.  Changes to insurance: No  New side effects reported not previously addressed with a pharmacist or physician: None reported  Questions for the pharmacist: No    Confirmed patient received a Conservation officer, historic buildings and a Surveyor, mining with first shipment. The patient will receive a drug information handout for each medication shipped and additional FDA Medication Guides as required.       DISEASE/MEDICATION-SPECIFIC INFORMATION        N/A    SPECIALTY MEDICATION ADHERENCE     Medication Adherence    Patient reported X missed doses in the last month: 0  Specialty Medication: tacrolimus 0.5 MG capsule (PROGRAF)  Patient is on additional specialty medications: No  Patient is on more than two specialty medications: No  Any gaps in refill history greater than 2 weeks in the last 3 months: no  Demonstrates understanding of importance of adherence: yes  Informant: patient  Adherence tools used: patient uses a pill box to manage medications  Confirmed plan for next specialty medication refill: delivery by pharmacy  Refills needed for supportive medications: not needed          Refill Coordination    Has the Patients' Contact Information Changed: No  Is the Shipping Address Different: No         Were doses missed due to medication being on hold? No    tacrolimus 0.5  mg: 14 days of medicine on hand       REFERRAL TO PHARMACIST     Referral to the pharmacist: Not needed      [facility]     Shipping address confirmed in Epic.       Delivery Scheduled: Yes, Expected medication delivery date: 05/10/2023.     Medication will be delivered via Same Day Courier to the prescription address in Epic [place].    Kerby Less   [facility] Specialty Technician

## 2023-05-10 MED FILL — CHOLECALCIFEROL (VITAMIN D3) 25 MCG (1,000 UNIT) TABLET: ORAL | 90 days supply | Qty: 90 | Fill #3

## 2023-05-10 MED FILL — TACROLIMUS 0.5 MG CAPSULE, IMMEDIATE-RELEASE: ORAL | 30 days supply | Qty: 150 | Fill #8

## 2023-05-10 MED FILL — ATORVASTATIN 40 MG TABLET: ORAL | 90 days supply | Qty: 90 | Fill #2

## 2023-05-10 MED FILL — TRAZODONE 50 MG TABLET: ORAL | 90 days supply | Qty: 90 | Fill #2

## 2023-05-15 DIAGNOSIS — Z940 Kidney transplant status: Principal | ICD-10-CM

## 2023-05-28 NOTE — Unmapped (Signed)
[employer] forms

## 2023-06-10 MED ORDER — ALENDRONATE 70 MG TABLET
ORAL_TABLET | ORAL | 2 refills | 84 days | Status: CP
Start: 2023-06-10 — End: 2024-06-09
  Filled 2023-07-27: qty 12, 84d supply, fill #0

## 2023-06-10 NOTE — Unmapped (Signed)
Pt request for RX Refill

## 2023-06-10 NOTE — Unmapped (Signed)
Sophia Davis has been contacted in regards to their refill of tacrolimus. At this time, they have declined refill due to  pt is currently out of town and will not be back until 9/4 . Refill assessment call date has been updated per the patient's request.

## 2023-06-22 NOTE — Unmapped (Signed)
[facility] Refill Coordination Note    Specialty Medication(s) to be Shipped:   Transplant: tacrolimus 0.5mg     Other medication(s) to be shipped: No additional medications requested for fill at this time     Sophia Davis, DOB: 1946/04/22  Phone: [phone] (home)       All above HIPAA information was verified with patient.     Was a Nurse, learning disability used for this call? No    Completed refill call assessment today to schedule patient's medication shipment from the [facility] [phone]).  All relevant notes have been reviewed.     Specialty medication(s) and dose(s) confirmed: Regimen is correct and unchanged.   Changes to medications: Delorese reports no changes at this time.  Changes to insurance: No  New side effects reported not previously addressed with a pharmacist or physician: None reported  Questions for the pharmacist: No    Confirmed patient received a Conservation officer, historic buildings and a Surveyor, mining with first shipment. The patient will receive a drug information handout for each medication shipped and additional FDA Medication Guides as required.       DISEASE/MEDICATION-SPECIFIC INFORMATION        N/A    SPECIALTY MEDICATION ADHERENCE     Medication Adherence    Patient reported X missed doses in the last month: 0  Specialty Medication: tacrolimus 0.5 MG capsule (PROGRAF)  Patient is on additional specialty medications: No  Patient is on more than two specialty medications: No  Any gaps in refill history greater than 2 weeks in the last 3 months: no  Demonstrates understanding of importance of adherence: yes  Informant: patient  Adherence tools used: patient uses a pill box to manage medications  Confirmed plan for next specialty medication refill: delivery by pharmacy  Refills needed for supportive medications: not needed          Refill Coordination    Has the Patients' Contact Information Changed: No  Is the Shipping Address Different: No         Were doses missed due to medication being on hold? No    tacrolimus 0.5  mg: 6 days of medicine on hand       REFERRAL TO PHARMACIST     Referral to the pharmacist: Not needed      [facility]     Shipping address confirmed in Epic.       Delivery Scheduled: Yes, Expected medication delivery date: 06/28/2023.     Medication will be delivered via Same Day Courier to the prescription address in Epic W[facility].    Kerby Less   [facility] Specialty Technician

## 2023-06-28 MED FILL — TACROLIMUS 0.5 MG CAPSULE, IMMEDIATE-RELEASE: ORAL | 30 days supply | Qty: 150 | Fill #9

## 2023-07-09 ENCOUNTER — Ambulatory Visit: Admit: 2023-07-09 | Discharge: 2023-07-10 | Payer: MEDICARE

## 2023-07-09 DIAGNOSIS — Z940 Kidney transplant status: Principal | ICD-10-CM

## 2023-07-09 LAB — CBC W/ AUTO DIFF
BASOPHILS ABSOLUTE COUNT: 0 10*9/L (ref 0.0–0.1)
BASOPHILS RELATIVE PERCENT: 0.5 %
EOSINOPHILS ABSOLUTE COUNT: 0.2 10*9/L (ref 0.0–0.5)
EOSINOPHILS RELATIVE PERCENT: 2.8 %
HEMATOCRIT: 32.6 % — ABNORMAL LOW (ref 34.0–44.0)
HEMOGLOBIN: 10.6 g/dL — ABNORMAL LOW (ref 11.3–14.9)
LYMPHOCYTES ABSOLUTE COUNT: 1.8 10*9/L (ref 1.1–3.6)
LYMPHOCYTES RELATIVE PERCENT: 28.3 %
MEAN CORPUSCULAR HEMOGLOBIN CONC: 32.4 g/dL (ref 32.0–36.0)
MEAN CORPUSCULAR HEMOGLOBIN: 28.4 pg (ref 25.9–32.4)
MEAN CORPUSCULAR VOLUME: 87.9 fL (ref 77.6–95.7)
MEAN PLATELET VOLUME: 7.7 fL (ref 6.8–10.7)
MONOCYTES ABSOLUTE COUNT: 0.3 10*9/L (ref 0.3–0.8)
MONOCYTES RELATIVE PERCENT: 4 %
NEUTROPHILS ABSOLUTE COUNT: 4.2 10*9/L (ref 1.8–7.8)
NEUTROPHILS RELATIVE PERCENT: 64.4 %
PLATELET COUNT: 188 10*9/L (ref 150–450)
RED BLOOD CELL COUNT: 3.71 10*12/L — ABNORMAL LOW (ref 3.95–5.13)
RED CELL DISTRIBUTION WIDTH: 14.5 % (ref 12.2–15.2)
WBC ADJUSTED: 6.5 10*9/L (ref 3.6–11.2)

## 2023-07-09 LAB — COMPREHENSIVE METABOLIC PANEL
ALBUMIN: 3.4 g/dL (ref 3.4–5.0)
ALKALINE PHOSPHATASE: 75 U/L (ref 46–116)
ALT (SGPT): <7 U/L — ABNORMAL LOW (ref 10–49)
ANION GAP: 7 mmol/L (ref 5–14)
AST (SGOT): 11 U/L (ref <=34)
BILIRUBIN TOTAL: 0.5 mg/dL (ref 0.3–1.2)
BLOOD UREA NITROGEN: 34 mg/dL — ABNORMAL HIGH (ref 9–23)
BUN / CREAT RATIO: 18
CALCIUM: 9.8 mg/dL (ref 8.7–10.4)
CHLORIDE: 110 mmol/L — ABNORMAL HIGH (ref 98–107)
CO2: 23.8 mmol/L (ref 20.0–31.0)
CREATININE: 1.85 mg/dL — ABNORMAL HIGH
EGFR CKD-EPI (2021) FEMALE: 28 mL/min/{1.73_m2} — ABNORMAL LOW (ref >=60)
GLUCOSE RANDOM: 91 mg/dL (ref 70–99)
POTASSIUM: 3.5 mmol/L (ref 3.4–4.8)
PROTEIN TOTAL: 6.3 g/dL (ref 5.7–8.2)
SODIUM: 141 mmol/L (ref 135–145)

## 2023-07-09 LAB — PHOSPHORUS: PHOSPHORUS: 4.3 mg/dL (ref 2.4–5.1)

## 2023-07-09 LAB — MAGNESIUM: MAGNESIUM: 1.6 mg/dL (ref 1.6–2.6)

## 2023-07-09 LAB — TACROLIMUS LEVEL, TROUGH: TACROLIMUS, TROUGH: 5.2 ng/mL (ref 5.0–15.0)

## 2023-07-20 NOTE — Unmapped (Signed)
Called patient to schedule annual visit with Dr.Fuller. No answer, left voice message.

## 2023-07-23 MED ORDER — ATORVASTATIN 40 MG TABLET
ORAL_TABLET | ORAL | 2 refills | 90 days | Status: CP
Start: 2023-07-23 — End: 2024-07-22
  Filled 2023-07-27: qty 90, 90d supply, fill #0

## 2023-07-23 MED ORDER — CHOLECALCIFEROL (VITAMIN D3) 25 MCG (1,000 UNIT) TABLET
ORAL_TABLET | Freq: Every day | ORAL | 3 refills | 90 days | Status: CP
Start: 2023-07-23 — End: 2024-07-22
  Filled 2023-07-27: qty 100, 100d supply, fill #0

## 2023-07-23 NOTE — Unmapped (Signed)
[facility] Refill Coordination Note    Specialty Medication(s) to be Shipped:   Transplant: tacrolimus 0.5mg     Other medication(s) to be shipped:  Alendronate ,  Trazodone ,  Cholecalciferol ,  Atorvastatin       Sophia Davis, DOB: 02/06/1946  Phone: [phone] (home)       All above HIPAA information was verified with patient.     Was a Nurse, learning disability used for this call? No    Completed refill call assessment today to schedule patient's medication shipment from the [facility] [phone]).  All relevant notes have been reviewed.     Specialty medication(s) and dose(s) confirmed: Regimen is correct and unchanged.   Changes to medications: Sophia Davis reports no changes at this time.  Changes to insurance: No  New side effects reported not previously addressed with a pharmacist or physician: None reported  Questions for the pharmacist: No    Confirmed patient received a Conservation officer, historic buildings and a Surveyor, mining with first shipment. The patient will receive a drug information handout for each medication shipped and additional FDA Medication Guides as required.       DISEASE/MEDICATION-SPECIFIC INFORMATION        N/A    SPECIALTY MEDICATION ADHERENCE     Medication Adherence    Patient reported X missed doses in the last month: 0  Specialty Medication: tacrolimus 0.5 MG capsule (PROGRAF)  Patient is on additional specialty medications: No  Patient is on more than two specialty medications: No  Any gaps in refill history greater than 2 weeks in the last 3 months: no  Demonstrates understanding of importance of adherence: yes  Adherence tools used: patient uses a pill box to manage medications                Were doses missed due to medication being on hold? No    tacrolimus 0.5  mg: 6 -7 days of medicine on hand       REFERRAL TO PHARMACIST     Referral to the pharmacist: Not needed      [facility]     Shipping address confirmed in Epic.       Delivery Scheduled: Yes, Expected medication delivery date: 07/27/2023.     Medication will be delivered via Same Day Courier to the prescription address in Epic W[facility].    Ricci Barker   [facility] Specialty Technician

## 2023-07-27 MED FILL — TRAZODONE 50 MG TABLET: ORAL | 90 days supply | Qty: 90 | Fill #3

## 2023-07-27 MED FILL — TACROLIMUS 0.5 MG CAPSULE, IMMEDIATE-RELEASE: ORAL | 30 days supply | Qty: 150 | Fill #10

## 2023-07-28 ENCOUNTER — Ambulatory Visit: Admit: 2023-07-28 | Discharge: 2023-07-28 | Payer: MEDICARE

## 2023-07-28 DIAGNOSIS — R0602 Shortness of breath: Principal | ICD-10-CM

## 2023-07-28 DIAGNOSIS — R051 Acute cough: Principal | ICD-10-CM

## 2023-07-28 DIAGNOSIS — M810 Age-related osteoporosis without current pathological fracture: Principal | ICD-10-CM

## 2023-07-28 DIAGNOSIS — I10 Essential (primary) hypertension: Principal | ICD-10-CM

## 2023-07-28 DIAGNOSIS — R002 Palpitations: Principal | ICD-10-CM

## 2023-07-28 DIAGNOSIS — I4819 Other persistent atrial fibrillation: Principal | ICD-10-CM

## 2023-07-28 MED ORDER — APIXABAN 2.5 MG TABLET
ORAL_TABLET | Freq: Two times a day (BID) | ORAL | 6 refills | 30 days | Status: CP
Start: 2023-07-28 — End: ?

## 2023-07-28 MED ORDER — ELIQUIS 2.5 MG TABLET
ORAL_TABLET | Freq: Two times a day (BID) | ORAL | 0 refills | 30.00 days
Start: 2023-07-28 — End: ?

## 2023-07-28 MED ORDER — ALBUTEROL SULFATE HFA 90 MCG/ACTUATION AEROSOL INHALER
Freq: Four times a day (QID) | RESPIRATORY_TRACT | 1 refills | 0 days | Status: CP | PRN
Start: 2023-07-28 — End: 2024-07-27

## 2023-07-28 MED ORDER — METOPROLOL TARTRATE 25 MG TABLET
ORAL_TABLET | Freq: Two times a day (BID) | ORAL | 6 refills | 30 days | Status: CP
Start: 2023-07-28 — End: 2024-07-27

## 2023-07-28 MED ORDER — AEROCHAMBER MV SPACER
Freq: Every day | 0 refills | 1 days | Status: CP
Start: 2023-07-28 — End: ?

## 2023-07-28 NOTE — Unmapped (Unsigned)
Internal Medicine Clinic Visit    Reason for visit: Acute cough, wheezing, and heart palpitations     A/P:    Sophia Davis is a 77 y.o. F with PMHx of CAD, HTN, osteoporosis, stage 3b CKD, kidney transplant with immunosuppression here for 5 days of cough, wheezing, and heart palpations.     Assessment & Plan  Acute cough  Patient with 5 days of non-productive, wet cough, fatigue, wheezing, and heart palpitations that started after she returned from a cruise following 1 day of sore throat (now resolved) and most recently 2 days of dizziness and weakness. Denies fever, N/V/D. On exam, diffuse wheezes throughout inspiration and expiration were present. CV exam notable for tachycardia to the 160s. Highest concern for acute bronchitis 2/2 URI most likely of viral etiology given her recent cruise, wheezing, URI symptoms of cough and sore throat. Given her new onset of elevated HR, there is some concern for pneumonia though lack of fever and signs of consolidation on exam make this less likely. Lower concern for new COPD given lack of smoking history and normal O2 sats. Should assess for specific pathogens as well. Would be beneficial to start albuterol inhaler to help with wheezing and difficulty breathing. Low threshold for starting antibiotics if there are any concerning CXR signs or if condition worsens.   - CXR  - RPR  - Albuterol 2 pufss q6hr PRN with aerochamber  Orders:    RAPID INFLUENZA/RSV/COVID-19 PCR; Future    RAPID INFLUENZA/RSV/COVID-19 PCR    RAPID INFLUENZA/RSV/COVID PCR    Shortness of breath  As above.  Orders:    XR Chest 2 views; Future    albuterol HFA 90 mcg/actuation inhaler; Inhale 2 puffs every six (6) hours as needed for wheezing.    inhalational spacing device (AEROCHAMBER MV) Spcr; 1 each by Miscellaneous route in the morning.    Palpitation  Patient with two days of heart palpitations with HR as high as 160s in office. She has also had associated dizziness, weakness, and fatigue. EKG with tachycardia and evidence of both atrial flutter and atrial fibrillation with RVR on separate measurements. It is unclear how long she has been in Afib or the exact etiology at this time, though it could be related to her recent likely viral illness. Given her new onset Afib, it is reasonable to begin rate control to lower her HR with plan for close follow up and consultation with EP. Also plan to start anticoagulation today with renal dosing due to stage 3 CKD and history of kidney transplant with most recent GFR of 28 and Cr stable and improved at 1.85 (07/09/23).   Orders:    ECG 12 lead    Persistent atrial fibrillation (CMS-HCC)  As above.   Orders:    metoPROLOL tartrate (LOPRESSOR) 25 MG tablet; Take 1 tablet (25 mg total) by mouth two (2) times a day.    apixaban (ELIQUIS) 2.5 mg Tab; Take 1 tablet (2.5 mg total) by mouth two (2) times a day.    Ambulatory referral to Cardiac Electrophysiology; Future    Osteoporosis, unspecified osteoporosis type, unspecified pathological fracture presence  Did not discuss DEXA and alendronate therapy with patient today due to acute illness, however should discuss alendronate holiday and repeat DEXA at future follow up.  Orders:    Dexa Bone Density Skeletal; Future    Essential hypertension (RAF-HCC)  BP in office was wnl today at 120/60. There has been some confusion about which BP medications to continue  vs discontinue. She has not been taking amlodipine or losartan, but has continued to take hydrochlorothiazide. Given good BP control today and acute illness, will plan to follow up with Dr. Toni Arthurs on reason for discontinuation of losartan and adjust other medications in the future.  - Follow up with Dr. Toni Arthurs on BP medications and management plan          I personally spent *** minutes face-to-face and non-face-to-face in the care of this patient, which includes all pre, intra, and post visit time on the date of service.    __________________________________________________________    HPI:    77 y.o. female here for acute cough and tachycardia.     Patient has been having racing heart rate for 1-2 weeks. She is also feeling dizzy today and yesterday. This has never happened before.     She recently went on a cruise and is now feeling sick. She has been having a non-productive, wet cough, wheezing, and feeling weak since Saturday. She has taken delsum kids. It started with a sore throat, but that has resolved now. She has not been eating or drinking much since she started feeling ill. No nausea or diarrhea, though she vomited once which she reports was due to post nasal drip. She has pain with cough across her abdomen. She denies fever.     BP: Dr. Toni Arthurs stopped losartan in July 2024, though unclear why. She has not been taking this since May. She has also not been taking her amlodipine due to a mistake written in her prescription       __________________________________________________________      Medications:  See below.    __________________________________________________________    Physical Exam:   Vital Signs:  Vitals:    07/28/23 1010   BP: 120/60   BP Site: L Arm   BP Position: Sitting   Pulse: 141   Resp: 16   Temp: 36.9 ??C (98.5 ??F)   TempSrc: Oral   SpO2: 98%   Weight: 46.6 kg (102 lb 12.8 oz)   Height: 154.9 cm (5' 0.98)       Gen: Ill-appearing, in mild distress  HEENT: No cervical lymphadenopathy, OP clear  CV: Tachycardic, no clear abnormal rhythm appreciated   Pulm: Diffuse wheezes bilaterally in all lung fields, course crackles heard with cough          Your Medication List            Accurate as of July 28, 2023  9:50 AM. If you have any questions, ask your nurse or doctor.                CONTINUE taking these medications      alendronate 70 MG tablet  Commonly known as: FOSAMAX  Take 1 tablet (70 mg total) by mouth every seven (7) days.     amlodipine 5 MG tablet  Commonly known as: NORVASC  Take 1 tablet (5 mg total) by mouth nightly.     aspirin 81 MG chewable tablet  Chew 1 tablet (81 mg total) daily.     atorvastatin 40 MG tablet  Commonly known as: LIPITOR  Take 1 tablet by mouth every evening     cholecalciferol (vitamin D3 25 mcg (1,000 units)) 1,000 unit (25 mcg) tablet  Take 1 tablet (25 mcg total) by mouth daily.     famotidine 20 MG tablet  Commonly known as: PEPCID  TAKE 1 TABLET (20 MG TOTAL) BY MOUTH TWO (2) TIMES A DAY  AS NEEDED FOR HEARTBURN.     tacrolimus 0.5 MG capsule  Commonly known as: PROGRAF  Take 3 capsules (1.5 mg total) by mouth in the morning AND 2 capsules (1 mg total) in the evening.     traZODone 50 MG tablet  Commonly known as: DESYREL  Take 1 tablet (50 mg total) by mouth nightly.     triamterene-hydroCHLOROthiazide 37.5-25 mg per tablet  Commonly known as: MAXZIDE-25  TAKE 0.5 TABLET BY MOUTH DAILY     valGANciclovir 450 mg tablet  Commonly known as: VALCYTE  Take 2 tablets (900 mg total) by mouth daily.                Milagros Loll, MS3

## 2023-07-28 NOTE — Unmapped (Addendum)
Did not discuss DEXA and alendronate therapy with patient today due to acute illness, however should discuss alendronate holiday and repeat DEXA at future follow up.  Orders:    Dexa Bone Density Skeletal; Future

## 2023-07-28 NOTE — Unmapped (Addendum)
BP in office was wnl today at 120/60. There has been some confusion about which BP medications to continue vs discontinue. She has not been taking amlodipine or losartan, but has continued to take hydrochlorothiazide. Given good BP control today and acute illness, will plan to follow up with Dr. Toni Arthurs on reason for discontinuation of losartan and adjust other medications in the future.  - Follow up with Dr. Toni Arthurs on BP medications and management plan

## 2023-07-28 NOTE — Unmapped (Addendum)
START taking Metoprolol 25mg  (medicine to slow down your heart rate) twice per day  STOP taking the baby aspirin  START eliquis 2.5mg  (blood thinner) twice a day   START using albuterol inhaler with a spacer for wheezing.    Please send Dr. Irena Cords a note by email with your blood pressure and heart rate readings. Send me note by Friday of this week.  [email or web address]    If easier, you can call the clinic and tell the nurse your numbers: [phone]

## 2023-08-03 DIAGNOSIS — R609 Edema, unspecified: Principal | ICD-10-CM

## 2023-08-03 DIAGNOSIS — Z940 Kidney transplant status: Principal | ICD-10-CM

## 2023-08-03 MED ORDER — TRIAMTERENE 37.5 MG-HYDROCHLOROTHIAZIDE 25 MG TABLET
ORAL_TABLET | Freq: Every day | ORAL | 1 refills | 0 days
Start: 2023-08-03 — End: ?

## 2023-08-04 MED ORDER — TRIAMTERENE 37.5 MG-HYDROCHLOROTHIAZIDE 25 MG TABLET
ORAL_TABLET | Freq: Every day | ORAL | 1 refills | 90 days | Status: CP
Start: 2023-08-04 — End: ?

## 2023-08-05 ENCOUNTER — Ambulatory Visit: Admit: 2023-08-05 | Discharge: 2023-08-06 | Payer: MEDICARE

## 2023-08-09 DIAGNOSIS — Z940 Kidney transplant status: Principal | ICD-10-CM

## 2023-08-09 DIAGNOSIS — D849 Immunodeficiency, unspecified: Principal | ICD-10-CM

## 2023-08-09 DIAGNOSIS — E559 Vitamin D deficiency, unspecified: Principal | ICD-10-CM

## 2023-08-09 DIAGNOSIS — N186 End stage renal disease: Principal | ICD-10-CM

## 2023-08-09 NOTE — Unmapped (Signed)
Called pt and asked her to get labs when she comes for appt on Oct 30, she was reminded to hold tac until after labs. She states understanding and will do this.   She reports while on phone, she was told she has an arrhythmia and was started on medication for same by PCP. Pt started on Metoprolol and eliquis

## 2023-08-16 NOTE — Unmapped (Signed)
[facility] Delivery Pharm[facility] Refill Coordination Note    Specialty Medication(s) to be Shipped:   Transplant: tacrolimus 0.5 mg    Other medication(s) to be shipped: No additional medications requested for fill at this time     Sophia Davis, DOB: 04/19/1946  Phone: [phone] (home)       All above HIPAA information was verified with patient.     Was a Nurse, learning disability used for this call? No    Completed refill call assessment today to schedule patient's medication shipment from the [facility]  [phone]).  All relevant notes have been reviewed.     Specialty medication(s) and dose(s) confirmed: Regimen is correct and unchanged.   Changes to medications: Sophia Davis reports no changes at this time.  Changes to insurance: No  New side effects reported not previously addressed with a pharmacist or physician: None reported  Questions for the pharmacist: No    Confirmed patient received a Conservation officer, historic buildings and a Surveyor, mining with first shipment. The patient will receive a drug information handout for each medication shipped and additional FDA Medication Guides as required.       DISEASE/MEDICATION-SPECIFIC INFORMATION        N/A    SPECIALTY MEDICATION ADHERENCE     Medication Adherence    Patient reported X missed doses in the last month: 0  Specialty Medication: tacrolimus 0.5 MG capsule (PROGRAF)  Patient is on additional specialty medications: No  Adherence tools used: patient uses a pill box to manage medications              Were doses missed due to medication being on hold? No    tacrolimus 0.5 mg: 7 days of medicine on hand       REFERRAL TO PHARMACIST     Referral to the pharmacist: Not needed      [facility]     Shipping address confirmed in Epic.       Delivery Scheduled: Yes, Expected medication delivery date: 08/19/23.     Medication will be delivered via Same Day Courier to the prescription address in Epic WAM.    Sophia Davis   [facility]  Specialty Technician

## 2023-08-18 ENCOUNTER — Ambulatory Visit: Admit: 2023-08-18 | Discharge: 2023-08-19 | Payer: MEDICARE

## 2023-08-18 DIAGNOSIS — D849 Immunodeficiency, unspecified: Principal | ICD-10-CM

## 2023-08-18 DIAGNOSIS — Z940 Kidney transplant status: Principal | ICD-10-CM

## 2023-08-18 DIAGNOSIS — M3130 Wegener's granulomatosis without renal involvement: Principal | ICD-10-CM

## 2023-08-18 DIAGNOSIS — C539 Malignant neoplasm of cervix uteri, unspecified: Principal | ICD-10-CM

## 2023-08-18 DIAGNOSIS — E559 Vitamin D deficiency, unspecified: Principal | ICD-10-CM

## 2023-08-18 DIAGNOSIS — I10 Essential (primary) hypertension: Principal | ICD-10-CM

## 2023-08-18 DIAGNOSIS — N186 End stage renal disease: Principal | ICD-10-CM

## 2023-08-18 DIAGNOSIS — R051 Acute cough: Principal | ICD-10-CM

## 2023-08-18 DIAGNOSIS — M810 Age-related osteoporosis without current pathological fracture: Principal | ICD-10-CM

## 2023-08-18 DIAGNOSIS — I4819 Other persistent atrial fibrillation: Principal | ICD-10-CM

## 2023-08-18 LAB — PARATHYROID HORMONE (PTH): PARATHYROID HORMONE INTACT: 80.3 pg/mL — ABNORMAL HIGH (ref 18.4–80.1)

## 2023-08-18 LAB — URINALYSIS WITH MICROSCOPY
BILIRUBIN UA: NEGATIVE
GLUCOSE UA: NEGATIVE
KETONES UA: NEGATIVE
NITRITE UA: POSITIVE — AB
PH UA: 6 (ref 5.0–9.0)
PROTEIN UA: 70 — AB
RBC UA: 16 /HPF — ABNORMAL HIGH (ref <=4)
SPECIFIC GRAVITY UA: 1.012 (ref 1.003–1.030)
SQUAMOUS EPITHELIAL: 1 /HPF (ref 0–5)
UROBILINOGEN UA: <2
WBC UA: 16 /HPF — ABNORMAL HIGH (ref 0–5)

## 2023-08-18 LAB — PROTEIN / CREATININE RATIO, URINE
CREATININE, URINE: 94.9 mg/dL
PROTEIN URINE: 102.6 mg/dL
PROTEIN/CREAT RATIO, URINE: 1.081

## 2023-08-18 LAB — CBC W/ AUTO DIFF
BASOPHILS ABSOLUTE COUNT: 0 10*9/L (ref 0.0–0.1)
BASOPHILS RELATIVE PERCENT: 0.7 %
EOSINOPHILS ABSOLUTE COUNT: 0.3 10*9/L (ref 0.0–0.5)
EOSINOPHILS RELATIVE PERCENT: 4.6 %
HEMATOCRIT: 33.2 % — ABNORMAL LOW (ref 34.0–44.0)
HEMOGLOBIN: 10.8 g/dL — ABNORMAL LOW (ref 11.3–14.9)
LYMPHOCYTES ABSOLUTE COUNT: 1.8 10*9/L (ref 1.1–3.6)
LYMPHOCYTES RELATIVE PERCENT: 28.2 %
MEAN CORPUSCULAR HEMOGLOBIN CONC: 32.6 g/dL (ref 32.0–36.0)
MEAN CORPUSCULAR HEMOGLOBIN: 28.5 pg (ref 25.9–32.4)
MEAN CORPUSCULAR VOLUME: 87.4 fL (ref 77.6–95.7)
MEAN PLATELET VOLUME: 8.4 fL (ref 6.8–10.7)
MONOCYTES ABSOLUTE COUNT: 0.7 10*9/L (ref 0.3–0.8)
MONOCYTES RELATIVE PERCENT: 10.9 %
NEUTROPHILS ABSOLUTE COUNT: 3.6 10*9/L (ref 1.8–7.8)
NEUTROPHILS RELATIVE PERCENT: 55.6 %
PLATELET COUNT: 219 10*9/L (ref 150–450)
RED BLOOD CELL COUNT: 3.8 10*12/L — ABNORMAL LOW (ref 3.95–5.13)
RED CELL DISTRIBUTION WIDTH: 16.1 % — ABNORMAL HIGH (ref 12.2–15.2)
WBC ADJUSTED: 6.5 10*9/L (ref 3.6–11.2)

## 2023-08-18 LAB — RENAL FUNCTION PANEL
ALBUMIN: 3.5 g/dL (ref 3.4–5.0)
ANION GAP: 8 mmol/L (ref 5–14)
BLOOD UREA NITROGEN: 24 mg/dL — ABNORMAL HIGH (ref 9–23)
BUN / CREAT RATIO: 18
CALCIUM: 9.6 mg/dL (ref 8.7–10.4)
CHLORIDE: 105 mmol/L (ref 98–107)
CO2: 27.5 mmol/L (ref 20.0–31.0)
CREATININE: 1.32 mg/dL — ABNORMAL HIGH
EGFR CKD-EPI (2021) FEMALE: 41 mL/min/{1.73_m2} — ABNORMAL LOW (ref >=60)
GLUCOSE RANDOM: 90 mg/dL (ref 70–99)
PHOSPHORUS: 4.6 mg/dL (ref 2.4–5.1)
POTASSIUM: 3.6 mmol/L (ref 3.4–4.8)
SODIUM: 140 mmol/L (ref 135–145)

## 2023-08-18 LAB — CMV DNA, QUANTITATIVE, PCR: CMV VIRAL LD: NOT DETECTED

## 2023-08-18 LAB — MAGNESIUM: MAGNESIUM: 1.5 mg/dL — ABNORMAL LOW (ref 1.6–2.6)

## 2023-08-18 MED ORDER — LOSARTAN 50 MG TABLET
ORAL_TABLET | ORAL | 3 refills | 90 days | Status: CP
Start: 2023-08-18 — End: 2024-08-17

## 2023-08-18 NOTE — Unmapped (Addendum)
Atrial fibrillation  - Please attend your cardiology appointment on 12/9, they will help [place] determine ways return your heart to a normal rhythm.    Osteoporosis  - We are referring you to an Endocrinology clinic. Please attend their appointment to help [place] best protect your bone health  - Please stop taking your alendronate    Blood pressure  - We are waiting to see your blood work from today. We will discuss with Dr. Toni Arthurs on what medications would be best to lower your blood pressure. We will reach out with recommendations.

## 2023-08-18 NOTE — Unmapped (Signed)
Patient states heart rate is normal at home when checking at home. Taking metoprolol 25mg  BID and eliquis 2.5mg  BId. No sense of fluttering in chest, no chest pain, no dizziness, no light-headedness. No easy bleeding. ECG today with atrial fibrillation.   - Appointment with Cardiology 12/9 to consider rhythm control effort.  Orders:    ECG 12 Lead

## 2023-08-18 NOTE — Unmapped (Addendum)
No active symptoms, on immunosuppressive therapy

## 2023-08-18 NOTE — Unmapped (Addendum)
Blood pressure in clinic today 152/72. States she checks it at home and it ranges from 120s-140s/50s-70s. No headaches or vision changes. Given her stable sCr, starting losartan 25mg  BID and continuing triamterene-hydrochlorothiazide 0.5 tablets daily. Confirmed treatment plan with kidney transplant team.  Orders:    losartan (COZAAR) 50 MG tablet; Take 0.5 tablets (25 mg total) by mouth two (2) times a day.

## 2023-08-18 NOTE — Unmapped (Addendum)
Had a hysterectomy with no signs of recurrence.

## 2023-08-18 NOTE — Unmapped (Addendum)
sCr today is 1.32, within patient's baseline of 1.2-1.5. Not having any pain or burning when urinating. Urinalysis with some wbcs. Will hold antibiotics at this time as patient is asymptomatic. Completed 90-day course of valganciclovir. Obtaining CMV level with labs with morning.  - Appointment with Dr. Toni Arthurs 12/13

## 2023-08-18 NOTE — Unmapped (Signed)
[facility]     Type of visit: face to face    Are you located in [place]? (for virtual visits only) Yes    Reason for visit: Follow up            [facility]ron BPs (complete if screening BP has a systolic  > 130 or diastolic > 80)  BP#1  147/65 79    BP#2   154/76 78    BP#3  156/76 77    Average BP 152/72 78  (please note this as a comment in vitals)     HCDM reviewed and updated in Epic:    We are working to make sure all of our patients??? wishes are updated in Epic and part of that is documenting a Environmental health practitioner for each patient  A Health Care Decision Maker is someone you choose who can make health care decisions for you if you are not able - who would you most want to do this for you????  is already up to date.    HCDM (other legal document): Sophia Davis - Son - [phone]    HCDM (patient stated preference): Sophia Davis - Daughter - [phone]        Immunization History   Administered Date(s) Administered    COVID-19 VACC,(JANSSEN)(PF) 01/11/2020    COVID-19 VACCINE,MRNA(MODERNA)(PF) 10/02/2020    INFLUENZA QUAD ADJUVANTED 17YR UP(FLUAD) 07/02/2021    INFLUENZA TIV (TRI) PF (IM)(HISTORICAL) 08/30/2006, 09/14/2007, 08/14/2009, 08/26/2011, 07/27/2012    Influenza TRI (IIV3) 4+yrs PF 08/15/2013    Influenza Vaccine Quad(IM)6 MO-Adult(PF) 07/04/2014, 07/23/2015, 10/06/2016, 07/27/2017, 06/28/2018, 07/12/2019, 10/02/2020    PNEUMOCOCCAL POLYSACCHARIDE 23-VALENT 06/22/2003, 08/12/2015    Pneumococcal Conjugate 13-Valent 09/26/2013    Pneumococcal Conjugate 20-valent 03/21/2021    SHINGRIX-ZOSTER VACCINE (HZV),RECOMBINANT,ADJUVANTED(IM) 01/12/2018, 06/17/2018       __________________________________________________________________________________________    SCREENINGS COMPLETED IN FLOWSHEETS    HARK Screening       AUDIT       PHQ2       PHQ9          P4 Suicidality Screener                GAD7       COPD Assessment       Falls Risk

## 2023-08-18 NOTE — Unmapped (Addendum)
Worsening DEXA scan after 5 years of alendronate therapy with T scores of lumbar spine -2.6 (from -2.4), left femoral neck -3.7 (from -3.5), and left total hip -3.4 (from -2.8). Patient was taking alendronate 70mg  every 7 days. Referring patient to endocrinology for further guidance on improving bone density and protecting against fractures. Discontinuing alendronate for now.  Orders:    Ambulatory referral to Endocrinology; Future

## 2023-08-18 NOTE — Unmapped (Signed)
Internal Medicine Clinic Visit    Reason for visit: Follow-up following URI     Sophia Davis is a 77 y.o. F with PMHx of CAD, HTN, osteoporosis, stage 3b CKD, kidney transplant with immunosuppression here for follow-up for URI and atrial fibrillation.    Assessment & Plan  Persistent atrial fibrillation (CMS-HCC)  Patient states heart rate is normal at home when checking at home. Taking metoprolol 25mg  BID and eliquis 2.5mg  BId. No sense of fluttering in chest, no chest pain, no dizziness, no light-headedness. No easy bleeding. ECG today with atrial fibrillation.   - Appointment with Cardiology 12/9 to consider rhythm control effort.  Orders:    ECG 12 Lead    Essential hypertension (RAF-HCC)  Blood pressure in clinic today 152/72. States she checks it at home and it ranges from 120s-140s/50s-70s. No headaches or vision changes. Given her stable sCr, starting losartan 25mg  BID and continuing triamterene-hydrochlorothiazide 0.5 tablets daily. Confirmed treatment plan with kidney transplant team.  Orders:    losartan (COZAAR) 50 MG tablet; Take 0.5 tablets (25 mg total) by mouth two (2) times a day.    Kidney transplant 05/17/2013  sCr today is 1.32, within patient's baseline of 1.2-1.5. Not having any pain or burning when urinating. Urinalysis with some wbcs. Will hold antibiotics at this time as patient is asymptomatic. Completed 90-day course of valganciclovir. Obtaining CMV level with labs with morning.  - Appointment with Dr. Toni Arthurs 12/13       Osteoporosis, unspecified osteoporosis type, unspecified pathological fracture presence  Worsening DEXA scan after 5 years of alendronate therapy with T scores of lumbar spine -2.6 (from -2.4), left femoral neck -3.7 (from -3.5), and left total hip -3.4 (from -2.8). Patient was taking alendronate 70mg  every 7 days. Referring patient to endocrinology for further guidance on improving bone density and protecting against fractures. Discontinuing alendronate for now.  Orders:    Ambulatory referral to Endocrinology; Future    Acute cough  Improved today, likely resolving URI. No further wheezing on exam much improved from prior. Patient feels well with only occasional cough. No further intervention needed.       Granulomatosis with polyangiitis, unspecified whether renal involvement (CMS-HCC)  No active symptoms, on immunosuppressive therapy       Malignant neoplasm of cervix, unspecified site (CMS-HCC)  Had a hysterectomy with no signs of recurrence.          The documentation recorded by the scribe accurately reflects the service I personally performed and the decisions made by me.  Dolores Hoose, MD      __________________________________________________________    HPI:    77 y.o. female here for follow-up following URI and atrial fibrillation.    Doing well today. Rare cough now but feels improved from previous illness. Cough more noticeable at night. No concerns today. No shortness of breath. No fevers or chills.     Heart rate is staying low at home when checking at home. Taking metoprolol 25mg  BID and eliquis 2.5mg  BIG. No sense of fluttering in chest, no chest pain, no dizziness, no light-headedness. No easy bleeding.    Blood pressure in clinic today 152/72. States she checks it at home and it ranges from 120s-140s/50s-70s. No headaches or vision changes.     Not having any pain or burning when urinating.    Obtained DEXA scan last week, have not yet discussed results.    Unsure if taking amlodipine. Taking triamterene-hydrochlorothiazide, 1/2 pill a day. Took valganciclovir for 3 months then  stopped. CMV drawn today.  __________________________________________________________      Medications:  See below.    __________________________________________________________    Physical Exam:   Vital Signs:  Vitals:    08/18/23 1000   BP: 152/72   BP Site: L Arm   BP Position: Sitting   BP Cuff Size: Medium   Pulse: 79   Resp: 17   Temp: 36.2 ??C (97.2 ??F)   TempSrc: Temporal   SpO2: 100%   Weight: 47.4 kg (104 lb 6.4 oz)   Height: 152.4 cm (5')       Gen: Well appearing, NAD  CV: Regular rate, irregular rhythm. No murmurs appreciated  Pulm: CTA bilaterally, no crackles. Occasional wheezes resolved by coughing  GI: abdomen soft, NTND, normal BS. No HSM.  Pulses: 2+ radial  Ext: No edema. Peeling dry skin on lower extremities bilaterally consistent with healing from sunburn       Your Medication List            Accurate as of August 18, 2023 11:30 AM. If you have any questions, ask your nurse or doctor.                STOP taking these medications      aspirin 81 MG chewable tablet  Stopped by: Dolores Hoose, MD            START taking these medications      losartan 50 MG tablet  Commonly known as: COZAAR  Take 0.5 tablets (25 mg total) by mouth two (2) times a day.  Started by: Dolores Hoose, MD            CONTINUE taking these medications      AEROCHAMBER MV inhaler  Generic drug: inhalational spacing device  1 each by Miscellaneous route in the morning.     albuterol 90 mcg/actuation inhaler  Commonly known as: PROVENTIL HFA;VENTOLIN HFA  Inhale 2 puffs every six (6) hours as needed for wheezing.     alendronate 70 MG tablet  Commonly known as: FOSAMAX  Take 1 tablet (70 mg total) by mouth every seven (7) days.     amlodipine 5 MG tablet  Commonly known as: NORVASC  Take 1 tablet (5 mg total) by mouth nightly.     apixaban 2.5 mg Tab  Commonly known as: ELIQUIS  Take 1 tablet (2.5 mg total) by mouth two (2) times a day.     atorvastatin 40 MG tablet  Commonly known as: LIPITOR  Take 1 tablet by mouth every evening     cholecalciferol (vitamin D3 25 mcg (1,000 units)) 1,000 unit (25 mcg) tablet  Take 1 tablet (25 mcg total) by mouth daily.     metoPROLOL tartrate 25 MG tablet  Commonly known as: Lopressor  Take 1 tablet (25 mg total) by mouth two (2) times a day.     tacrolimus 0.5 MG capsule  Commonly known as: PROGRAF  Take 3 capsules (1.5 mg total) by mouth in the morning AND 2 capsules (1 mg total) in the evening.     traZODone 50 MG tablet  Commonly known as: DESYREL  Take 1 tablet (50 mg total) by mouth nightly.     triamterene-hydroCHLOROthiazide 37.5-25 mg per tablet  Commonly known as: MAXZIDE-25  TAKE 1/2 TABLET BY MOUTH DAILY     valGANciclovir 450 mg tablet  Commonly known as: VALCYTE  Take 2 tablets (900 mg total) by mouth daily.  This note was dictated using voice recognition software.  Please forgive typos.    Nicholes Calamity, MS3

## 2023-08-19 LAB — VITAMIN D 25 HYDROXY: VITAMIN D, TOTAL (25OH): 32.2 ng/mL (ref 20.0–80.0)

## 2023-08-19 LAB — TACROLIMUS LEVEL, TROUGH: TACROLIMUS, TROUGH: 4.3 ng/mL — ABNORMAL LOW (ref 5.0–15.0)

## 2023-08-19 MED FILL — TACROLIMUS 0.5 MG CAPSULE, IMMEDIATE-RELEASE: ORAL | 30 days supply | Qty: 150 | Fill #11

## 2023-09-11 LAB — VITAMIN D 1,25 DIHYDROXY

## 2023-09-17 LAB — VITAMIN D 1,25 DIHYDROXY
VITAMIN D 1,25 D2: <8 pg/mL
VITAMIN D 1,25 D3: 23 pg/mL
VITAMIN D,1,25 (OH) 2, TOTAL: 23 pg/mL

## 2023-09-19 DIAGNOSIS — D849 Immunodeficiency, unspecified: Principal | ICD-10-CM

## 2023-09-19 DIAGNOSIS — Z940 Kidney transplant status: Principal | ICD-10-CM

## 2023-09-19 MED ORDER — TACROLIMUS 0.5 MG CAPSULE, IMMEDIATE-RELEASE
ORAL_CAPSULE | ORAL | 11 refills | 30 days | Status: CP
Start: 2023-09-19 — End: ?
  Filled 2023-09-23: qty 150, 30d supply, fill #0

## 2023-09-21 MED ORDER — TRAZODONE 50 MG TABLET
ORAL_TABLET | Freq: Every evening | ORAL | 3 refills | 90 days | Status: CP
Start: 2023-09-21 — End: ?
  Filled 2023-10-06: qty 90, 90d supply, fill #0

## 2023-09-21 NOTE — Unmapped (Signed)
[facility] Delivery [facility] Refill Coordination Note    Specialty Medication(s) to be Shipped:   Transplant: tacrolimus 0.5mg     Other medication(s) to be shipped:  TRAZODONE     Sophia Davis, DOB: 12-04-1945  Phone: [phone] (home)       All above HIPAA information was verified with patient.     Was a Nurse, learning disability used for this call? No    Completed refill call assessment today to schedule patient's medication shipment from the [facility]  [phone]).  All relevant notes have been reviewed.     Specialty medication(s) and dose(s) confirmed: Regimen is correct and unchanged.   Changes to medications: Sophia Davis reports starting the following medications: metoprolol and eliquis  Changes to insurance: No  New side effects reported not previously addressed with a pharmacist or physician: None reported  Questions for the pharmacist: No    Confirmed patient received a Conservation officer, historic buildings and a Surveyor, mining with first shipment. The patient will receive a drug information handout for each medication shipped and additional FDA Medication Guides as required.       DISEASE/MEDICATION-SPECIFIC INFORMATION        N/A    SPECIALTY MEDICATION ADHERENCE     Medication Adherence    Patient reported X missed doses in the last month: 0  Specialty Medication: tacrolimus 0.5 MG capsule (PROGRAF)  Patient is on additional specialty medications: No  Informant: patient  Adherence tools used: patient uses a pill box to manage medications              Were doses missed due to medication being on hold? No     tacrolimus 0.5 MG capsule (PROGRAF): 10  days of medicine on hand         REFERRAL TO PHARMACIST     Referral to the pharmacist: Not needed      [facility]     Shipping address confirmed in Epic.       Delivery Scheduled: Yes, Expected medication delivery date: 09/23/23.     Medication will be delivered via Same Day Courier to the prescription address in Epic WAM.    Craige Cotta   [facility] Delivery Pharm[facility]  Specialty Technician

## 2023-09-27 ENCOUNTER — Ambulatory Visit
Admit: 2023-09-27 | Discharge: 2023-09-28 | Payer: MEDICARE | Attending: Cardiovascular Disease | Primary: Cardiovascular Disease

## 2023-09-27 DIAGNOSIS — I4819 Other persistent atrial fibrillation: Principal | ICD-10-CM

## 2023-09-27 NOTE — Unmapped (Signed)
DIVISION OF CARDIOLOGY  [facility] [place]        Date of Service: 09/27/2023    Electrophysiology New Patient Clinic Note    PCP: Referring Provider:   Edmonia James, MD  [address] [place] [address]  [place] [address]  Phone: [phone]  Fax: [phone] [place], Jeoffrey Massed, MD  [address]  [place] [address]  [place],  [place] [address]  Phone: [phone]  Fax: [phone]       Assessment and Plan:     Sophia Davis is a 77 y.o. woman with history of CAD, HTN, osteoporosis, stage 3b CKD, granulomatosis with polyangiitis, malignant neoplasm of cervix status post hysterectomy, kidney transplant with immunosuppression who is referred to EP clinic for assessment of persistent atrial fibrillation    Persistent atrial fibrillation: unclear duration of atrial fibrillation. No ECGs in our system between 2018 (sinus) and 07/2023 (Afib). Patient states heart rate is normal at home when checking at home. Taking metoprolol 25mg  BID and eliquis 2.5mg  BId. No sense of fluttering in chest, no chest pain, no dizziness, no light-headedness. No easy bleeding. ECG today with atrial fibrillation.  Echo 2014 showed normal LV ejection fraction 60-65%, mitral annular calcification, mild pulmonary hypertension, trivial-small pericardial effusion.  ECG 08/05/2023 showed atrial fibrillation with rapid ventricular response.  Subsequent ECG 08/18/2023 demonstrated rate controlled atrial fibrillation.  -Repeat echo   -Continue Eliquis  - will continue with eliquis 2.5mg  BID given she most commonly meets 2/3 criteria for dose reduction, and soon will meet 3/3 ( Cr>1.5, Weight <60kg ,age >50)  -following discussion of risks and benefits, she elects to proceed with cardioversion, assess for symptomatic improvement during sinus rhythm to inform whether long-term rhythm control is desired  - follow up TSH      Sophia Davis will return following cardioversion and echocardiogram    The patient was seen and evaluated by Dr. Kathi Ludwig      Subjective:     Chief Complaint:  No chief complaint on file.        History of Present Illness:     Sophia Davis is a 77 y.o. woman with history of CAD, HTN, osteoporosis, stage 3b CKD, granulomatosis with polyangiitis, malignant neoplasm of cervix status post hysterectomy, kidney transplant with immunosuppression who is referred to EP clinic for assessment of persistent atrial fibrillation      As noted above,  unclear duration of atrial fibrillation. No ECGs in our system between 2018 (sinus) and 07/2023 (Afib). Patient states heart rate is normal at home when checking at home. Taking metoprolol 25mg  BID and eliquis 2.5mg  BId. No sense of fluttering in chest, no chest pain, no dizziness, no light-headedness. No easy bleeding. ECG today with atrial fibrillation.  Echo 2014 showed normal LV ejection fraction 60-65%, mitral annular calcification, mild pulmonary hypertension, trivial-small pericardial effusion.  ECG 08/05/2023 showed atrial fibrillation with rapid ventricular response.  Subsequent ECG 08/18/2023 demonstrated rate controlled atrial fibrillation.    Heart rate is staying low at home when checking at home. Taking metoprolol 25mg  BID and eliquis 2.5mg  BIG. No sense of fluttering in chest, no chest pain, no dizziness, no light-headedness. No easy bleeding.     Blood pressure  at home reportedly ranges from 120s-140s/50s-70s.     She is not entirely sure whether she has symptoms related to atrial fibrillation.  She does endorse having exertional dyspnea which has been worse in the last 1 year.  This could be attributable to  atrial fibrillation    Cardiovascular Studies    2014 echo     Normal  left ventricular  ejection fraction  (60-65%)       Degenerative  mitral valve  disease       Mitral  annular calcification       Pulmonary  hypertension  (mild - see detail  below)       Normal  right ventricular  contractile performance       Tricuspid  regurgitation  (mild) Pericardial  effusion (see  detail below)     Medical History:  Past Medical History:   Diagnosis Date    Coronary artery disease     Essential hypertension 09/28/2003    Granulomatosis with Polyangiitis 12/17/2005    Immunosuppression (CMS-HCC) 07/22/2015       Surgical History:  Past Surgical History:   Procedure Laterality Date    CARDIAC SURGERY      stents    HYSTERECTOMY  1998    NEPHRECTOMY TRANSPLANTED ORGAN      OOPHORECTOMY  1998    PR COLONOSCOPY FLX DX W/COLLJ SPEC WHEN PFRMD N/A 12/30/2018    Procedure: COLONOSCOPY, FLEXIBLE, PROXIMAL TO SPLENIC FLEXURE; DIAGNOSTIC, W/WO COLLECTION SPECIMEN BY BRUSH OR WASH;  Surgeon: Leland Her, MD;  Location: [facility];  Service: Gastroenterology    PR EXPLOR POSTOP BLEED,INFEC,CLOT-ABD Right 05/22/2013    Procedure: EXPLOR POSTOP HEMORR THROMBOSIS/INFEC; ABD;  Surgeon: Estanislado Emms, MD;  Location: MAIN OR [place];  Service: Transplant    PR TRANSPLANT,PREP CADAVER RENAL GRAFT N/A 05/17/2013    Procedure: [facility] STD PREP CAD DONR RENAL ALLOGFT PRIOR TO TRNSPLNT, INCL DISSEC/REM PERINEPH FAT, DIAPH/RTPER ATTAC;  Surgeon: Purcell Mouton, MD;  Location: MAIN OR [place];  Service: Transplant    PR TRANSPLANTATION OF KIDNEY Right 05/17/2013    Procedure: RENAL ALLOTRANSPLANTATION, IMPLANTATION OF GRAFT; WITHOUT RECIPIENT NEPHRECTOMY;  Surgeon: Purcell Mouton, MD;  Location: MAIN OR [place];  Service: Transplant    SKIN BIOPSY      TRIGGER FINGER RELEASE Right 2016    middle and ring fingers       Social History:   reports that she has never smoked. She has never used smokeless tobacco. She reports that she does not drink alcohol and does not use drugs.    Family History:  family history includes Emphysema in her brother; Heart attack in her father; Heart disease in her father and sister; Hypertension in her mother, sister, sister, and son; No Known Problems in her son and son.    Review of Systems:   Except as noted in the HPI, the remainder of 10 systems reviewed is negative.     Allergies:  No Known Allergies    Medications:   Prior to Admission medications    Medication Dose, Route, Frequency   albuterol HFA 90 mcg/actuation inhaler 2 puffs, Inhalation, Every 6 hours PRN   alendronate (FOSAMAX) 70 MG tablet 70 mg, Oral, Every 7 days   amlodipine (NORVASC) 5 MG tablet 5 mg, Oral, Nightly   apixaban (ELIQUIS) 2.5 mg Tab 2.5 mg, Oral, 2 times a day (standard)   atorvastatin (LIPITOR) 40 MG tablet Take 1 tablet by mouth every evening   cholecalciferol, vitamin D3 25 mcg, 1,000 units,, 1,000 unit (25 mcg) tablet 25 mcg, Oral, Daily (standard)   inhalational spacing device (AEROCHAMBER MV) Spcr 1 each, Miscellaneous, Daily   losartan (COZAAR) 50 MG tablet 25 mg, Oral, 2 times a day (standard)  metoPROLOL tartrate (LOPRESSOR) 25 MG tablet 25 mg, Oral, 2 times a day (standard)   tacrolimus (PROGRAF) 0.5 MG capsule Take 3 capsules (1.5 mg total) by mouth in the morning AND 2 capsules (1 mg total) in the evening.   traZODone (DESYREL) 50 MG tablet 50 mg, Oral, Nightly   triamterene-hydroCHLOROthiazide (MAXZIDE-25) 37.5-25 mg per tablet 0.5 tablets, Oral, Daily (standard)   valGANciclovir (VALCYTE) 450 mg tablet 900 mg, Oral, Daily (standard)        Objective:     Vitals  There were no vitals taken for this visit.     Wt Readings from Last 3 Encounters:   08/18/23 47.4 kg (104 lb 6.4 oz)   07/28/23 46.6 kg (102 lb 12.8 oz)   03/05/23 47 kg (103 lb 9.6 oz)       Physical Exam  General:  Pleasant woman sitting in chair in nad.   Neck: Supple, JVP normal.   Resp:   CTAB bilaterally with normal WOB.   Cardio:  Irregularly irregular, no significant M/R/G   Abdomen:   Soft, non-distended, non-tender.   Extremities: Warm well-perfused bilaterally. No edema .   MSK: No joint swelling or erythema. No gross deformities.   Skin: No rashes   Neuro: CN II-XII grossly intact. Strength grossly intact.    Psych: Alert and oriented x3. Appropriate mood.      ECG (09/27/23)  Atrial fibrillation    Most Recent Labs   Lab Results   Component Value Date    NA 140 08/18/2023    K 3.6 08/18/2023    CL 105 08/18/2023    CO2 27.5 08/18/2023     Lab Results   Component Value Date    BUN 24 (H) 08/18/2023    BUN 34 (H) 07/09/2023    BUN 16 12/28/2016    BUN 18 08/31/2016     Lab Results   Component Value Date    Creatinine 1.32 (H) 08/18/2023    Creatinine 1.85 (H) 07/09/2023    Creatinine 1.14 01/21/2015    Creatinine 1.14 (H) 11/07/2014     No results found for: PROBNP  Lab Results   Component Value Date    Cholesterol 77 03/05/2023    Cholesterol, Total 136 01/21/2015    Triglycerides 94 03/05/2023    Triglycerides 75 12/24/2014    HDL 22 (L) 03/05/2023    HDL 44 01/21/2015    Non-HDL Cholesterol 55 (L) 03/05/2023    LDL Calculated 36 (L) 03/05/2023    LDL Direct 64 02/11/2011    LDL Cholesterol, Calculated 62 12/24/2014

## 2023-09-27 NOTE — Unmapped (Signed)
We will have our schedulers to call you. They will help set up a date for the cardioversion procedure. This is a procedure to reset the heart rhythm back to normal. We want to see if you feel better in normal rhythm.  If you are able to exercise better with less shortness of breath in normal rhythm, then maybe we should consider other ways to try to keep you in normal rhythm for the long term.     Please continue the eliquis.    We will order an echocardiogram to recheck the function of the heart and also a blood test to check the thyroid function. This can be drawn when you come back for labs on Friday.

## 2023-09-27 NOTE — Unmapped (Signed)
STOP BANG:   2

## 2023-09-28 DIAGNOSIS — Z940 Kidney transplant status: Principal | ICD-10-CM

## 2023-09-28 DIAGNOSIS — D849 Immunodeficiency, unspecified: Principal | ICD-10-CM

## 2023-09-28 DIAGNOSIS — N186 End stage renal disease: Principal | ICD-10-CM

## 2023-10-01 ENCOUNTER — Ambulatory Visit: Admit: 2023-10-01 | Discharge: 2023-10-01 | Payer: MEDICARE

## 2023-10-01 ENCOUNTER — Ambulatory Visit: Admit: 2023-10-01 | Discharge: 2023-10-01 | Payer: MEDICARE | Attending: Nephrology | Primary: Nephrology

## 2023-10-01 DIAGNOSIS — Z940 Kidney transplant status: Principal | ICD-10-CM

## 2023-10-01 MED ORDER — LOSARTAN 50 MG TABLET
ORAL_TABLET | Freq: Two times a day (BID) | ORAL | 11 refills | 30 days | Status: CP
Start: 2023-10-01 — End: 2024-09-30
  Filled 2023-10-06: qty 60, 30d supply, fill #0

## 2023-10-01 MED ORDER — MYCOPHENOLATE SODIUM 360 MG TABLET,DELAYED RELEASE
ORAL_TABLET | Freq: Two times a day (BID) | ORAL | 11 refills | 30.00 days | Status: CP
Start: 2023-10-01 — End: ?
  Filled 2023-10-07: qty 60, 30d supply, fill #0

## 2023-10-01 MED ORDER — LOSARTAN 25 MG TABLET
ORAL_TABLET | Freq: Two times a day (BID) | ORAL | 11 refills | 30 days | Status: CP
Start: 2023-10-01 — End: 2023-10-01

## 2023-10-01 NOTE — Unmapped (Signed)
Met w/ patient in [facility] today. Reviewed meds/symptoms. Any new medications?                 Fever/cold/flu symptoms reports non-productive cough  BP: 145/60 today/ Home BP reported 120-130/60  BG: not checking  Headache/Dizziness/Lightheaded: denies  Hand tremors: denies  Numbness/tingling: denies  Fevers/chills/sweats: denies  CP/SOB/palpatations: denies  Nausea/vomiting/heartburn: denies  Diarrhea/constipation: reports loose stool every 3-4 days  UTI symptoms (burn/pain/itch/frequency/urgency/odor/color/foam): denies  No visible or palpable edema     Appetite good; reports adequate hydration. 1-2 bottles/fluid per day - also drinks 2 cups of tea daily     Pt reports being well rested and getting adequate exercise.     Continues to follow Covid/health safety precautions by taking care to mask, perform frequent hand hygeine and minimal public activity. Offered support and guidance for this process given their immune-suppressed state. We discussed reduced covid vaccine coverage for transplant patients and importance of continuing to mask and practice safe distancing. Commented that booster vaccines will likely be advised as an ongoing process.     Pain: denies     Last tacrolimus taken 1000; DID NOT GET LABS TODAY Current dose 1.5/1 mg bid; MMF on hold for CMV     Other complaints or concerns: needs refill for trazadone - will call [facility] for delivery    Referrals needed: n/a     Pt Follow up: labs next Month ( Jan) - has appt for ECHO  Has cardioversion 12/17     Immunization status: declined flu/COVID     Plan-   Re-start MMF 360 mg bid  Increase losartan 50 mg bid  Labs next month

## 2023-10-05 ENCOUNTER — Encounter: Admit: 2023-10-05 | Discharge: 2023-10-05 | Payer: MEDICARE | Attending: Anesthesiology | Primary: Anesthesiology

## 2023-10-05 ENCOUNTER — Ambulatory Visit: Admit: 2023-10-05 | Discharge: 2023-10-05 | Payer: MEDICARE

## 2023-10-05 MED ADMIN — Propofol (DIPRIVAN) injection: INTRAVENOUS | @ 16:00:00 | Stop: 2023-10-05

## 2023-10-05 NOTE — Unmapped (Signed)
Cardiac Electrophysiology  History & Physical    History of Present Illness:   Sophia Davis is a 77 y.o. woman with history of CAD, HTN, osteoporosis, stage 3b CKD, granulomatosis with polyangiitis, malignant neoplasm of cervix status post hysterectomy, kidney transplant with immunosuppression who was referred to EP clinic for assessment of persistent atrial fibrillation. She is referred for DCCV.    She has been on Eliquis since 07/28/23 that was prescribed by PCP for new diagnosis of Afib. She has not missed any doses.       Unclear duration of atrial fibrillation. No ECGs in our system between 2018 (sinus) and 07/2023 (Afib). Patient states heart rate is normal at home when checking at home. Taking metoprolol 25mg  BID and eliquis 2.5mg  BId. No sense of fluttering in chest, no chest pain, no dizziness, no light-headedness. No easy bleeding. ECG today with atrial fibrillation.  Echo 2014 showed normal LV ejection fraction 60-65%, mitral annular calcification, mild pulmonary hypertension, trivial-small pericardial effusion.  ECG 08/05/2023 showed atrial fibrillation with rapid ventricular response.  Subsequent ECG 08/18/2023 demonstrated rate controlled atrial fibrillation.     Her Past Medical History, Problem List, Family and Social History, Allergies, and Medication List have been reviewed and updated in Epic.    Review of Systems:  As stated in the HPI, otherwise 10-point review of systems is negative    Physical Exam:  General Appearance: NAD, sitting up in chair comfortably  CV: irregular rate and rhythm  Resp: no increased WOB, able to speak in full sentences, no use of accessory muscles, no cough or audible wheeze  Skin: No obvious rashes or lesions.  Psych: No agitation or anxiety. Mood appropriate.  Neuro: AOAx3. Able to engage in conversation. Face symmetric. Normal gait. Moves all extremities spontaneously.      Electrocardiogram:  Atrial fibrillation    Labs, Reports, and available Outside records have been reviewed.    Lab Results   Component Value Date    WBC 6.5 08/18/2023    WBC 5.6 03/16/2023    WBC 4.9 01/21/2015    WBC 5.3 12/24/2014    HGB 10.8 (L) 08/18/2023    HGB 11.4 01/21/2015    Hgb, blood gas 9.6 (L) 05/22/2013    HCT 33.2 (L) 08/18/2023    HCT 36.5 01/21/2015    Platelet 219 08/18/2023    Platelet 224 01/21/2015    INR 1.1 05/16/2013    Creatinine 1.32 (H) 08/18/2023    Creatinine 1.14 01/21/2015    Sodium 140 08/18/2023    Sodium 138 01/21/2015    Potassium 3.6 08/18/2023    Potassium 4.1 01/21/2015    Magnesium 1.5 (L) 08/18/2023    Magnesium 1.6 01/21/2015    TSH 1.89 10/26/2013       ASSESSMENT AND PLAN   Sophia Davis is a(n) 77 y.o. female with the above stated history who has been referred for DCCV in setting of afib.      SITE MARKING ATTESTATION   Site Marked: Yes    CONSENT FOR OPERATION OR PROCEDURE: PROVIDER CERTIFICATION   I hereby certify that the nature, purpose, benefits, usual and most frequent risks of, and alternatives to, the operation or procedure have been explained to the patient (or person authorized to sign for the patient) either by a physician or by the provider who is to perform the operation or procedure, that the patient has had an opportunity to ask questions, and that those questions have been answered. The patient or the  patient's representative has been advised that selected tasks may be performed by assistants to the primary health care provider(s). I believe that the patient (or person authorized to sign for the patient) understands what has been explained, and has consented to the operation or procedure.

## 2023-10-06 NOTE — Unmapped (Signed)
[facility] Clinical Assessment & Refill Coordination Note    Sophia Davis, DOB: 05-Apr-1946  Phone: [phone] (home)     All above HIPAA information was verified with patient.     Was a Nurse, learning disability used for this call? No    Specialty Medication(s):   Transplant:  mycophenolic acid 360mg  and tacrolimus 0.5mg      No current facility-administered medications for this visit.     No current outpatient medications on file.        Changes to medications: Sophia Davis reports no changes at this time.    No Known Allergies    Changes to allergies: No    SPECIALTY MEDICATION ADHERENCE     Tacrolimus 0.5mg   : 15 days of medicine on hand   Mycophenolate 360mg   : 0 doses of medicine on hand       Medication Adherence    Patient reported X missed doses in the last month: 0  Specialty Medication: tacrolimus 0.5mg   Patient is on additional specialty medications: Yes  Additional Specialty Medications: Mycophenolate 360mg   Patient Reported Additional Medication X Missed Doses in the Last Month: 0  Patient is on more than two specialty medications: No  Adherence tools used: patient uses a pill box to manage medications          Specialty medication(s) dose(s) confirmed: Patient reports changes to the regimen as follows: will restart mycophenolate once this shipment arrives. Clinic aware of delivery date. Pt aware of new dosing using 360mg  tabs at 1 tab (360mg ) twice daily      Are there any concerns with adherence? No    Adherence counseling provided? Not needed    CLINICAL MANAGEMENT AND INTERVENTION      Clinical Benefit Assessment:    Do you feel the medicine is effective or helping your condition? Yes    Clinical Benefit counseling provided? Not needed    Adverse Effects Assessment:    Are you experiencing any side effects? No    Are you experiencing difficulty administering your medicine? No    Quality of Life Assessment:    Quality of Life    Rheumatology  Oncology  Dermatology  Cystic Fibrosis How many days over the past month did your transplant  keep you from your normal activities? For example, brushing your teeth or getting up in the morning. 0    Have you discussed this with your provider? Not needed    Acute Infection Status:    Acute infections noted within Epic:  No active infections  Patient reported infection: None    Therapy Appropriateness:    Is therapy appropriate based on current medication list, adverse reactions, adherence, clinical benefit and progress toward achieving therapeutic goals? Yes, therapy is appropriate and should be continued     DISEASE/MEDICATION-SPECIFIC INFORMATION      N/A    Solid Organ Transplant: Not Applicable    PATIENT SPECIFIC NEEDS     Does the patient have any physical, cognitive, or cultural barriers? No    Is the patient high risk? Yes, patient is taking a REMS drug. Medication is dispensed in compliance with REMS program    Did the patient require a clinical intervention? No    Does the patient require physician intervention or other additional services (i.e., nutrition, smoking cessation, social work)? No    SOCIAL DETERMINANTS OF HEALTH     At the [facility], we have learned that life circumstances - like trouble affording food, housing,  utilities, or transportation can affect the health of many of our patients.   That is why we wanted to ask: are you currently experiencing any life circumstances that are negatively impacting your health and/or quality of life? Patient declined to answer    Social Drivers of Health     Food Insecurity: No Food Insecurity (12/02/2022)    Hunger Vital Sign     Worried About Running Out of Food in the Last Year: Never true     Ran Out of Food in the Last Year: Never true   Internet Connectivity: Not on file   Housing/Utilities: Low Risk  (12/02/2022)    Housing/Utilities     Within the past 12 months, have you ever stayed: outside, in a car, in a tent, in an overnight shelter, or temporarily in someone else's home (i.e. couch-surfing)?: No     Are you worried about losing your housing?: No     Within the past 12 months, have you been unable to get utilities (heat, electricity) when it was really needed?: No   Tobacco Use: Low Risk  (10/01/2023)    Patient History     Smoking Tobacco Use: Never     Smokeless Tobacco Use: Never     Passive Exposure: Not on file   Transportation Needs: No Transportation Needs (12/02/2022)    PRAPARE - Transportation     Lack of Transportation (Medical): No     Lack of Transportation (Non-Medical): No   Alcohol Use: Not At Risk (12/02/2022)    Alcohol Use     How often do you have a drink containing alcohol?: Never     How many drinks containing alcohol do you have on a typical day when you are drinking?: 1 - 2     How often do you have 5 or more drinks on one occasion?: Never   Interpersonal Safety: Not on file   Physical Activity: Not on file   Intimate Partner Violence: Not At Risk (12/02/2022)    Humiliation, Afraid, Rape, and Kick questionnaire     Fear of Current or Ex-Partner: No     Emotionally Abused: No     Physically Abused: No     Sexually Abused: No   Stress: Not on file   Substance Use: Low Risk  (07/28/2023)    Substance Use     In the past year, how often have you used prescription drugs for non-medical reasons?: Never     In the past year, how often have you used illegal drugs?: Never     In the past year, have you used any substance for non-medical reasons?: No   Social Connections: Not on file   Financial Resource Strain: Low Risk  (12/02/2022)    Overall Financial Resource Strain (CARDIA)     Difficulty of Paying Living Expenses: Not hard at all   Depression: Not at risk (12/02/2022)    PHQ-2     PHQ-2 Score: 0   Health Literacy: Not on file       Would you be willing to receive help with any of the needs that you have identified today? Not applicable       SHIPPING     Specialty Medication(s) to be Shipped:   Transplant:  mycophenolic acid 360mg     Other medication(s) to be shipped: No additional medications requested for fill at this time  Patient declined ALL other refills today, next call set up for 1 week out     Changes  to insurance: No    Delivery Scheduled: Yes, Expected medication delivery date: 10/07/2023.     Medication will be delivered via Same Day Courier to the confirmed prescription address in [facility].    The patient will receive a drug information handout for each medication shipped and additional FDA Medication Guides as required.  Verified that patient has previously received a Conservation officer, historic buildings and a Surveyor, mining.    The patient or caregiver noted above participated in the development of this care plan and knows that they can request review of or adjustments to the care plan at any time.      All of the patient's questions and concerns have been addressed.    Thad Ranger, PharmD   [facility] Specialty Pharmacist

## 2023-10-06 NOTE — Unmapped (Addendum)
[facility] Pharmacist has reviewed a new prescription for mycophenolate that indicates a dose  restart for patient .  Patient was counseled on this dosage change by CC- see epic note from 10/01/23.  Next refill call date adjusted if necessary.        Clinical Assessment Needed For: Dose Change  Medication: Mycophenolate 360mg  EC tablet  Last Fill Date/Day Supply: N/A / N/A  Copay $0  Was previous dose already scheduled to fill: No    Notes to Pharmacist: LF 180mg  tablets on 05/13 for 30ds

## 2023-10-06 NOTE — Unmapped (Signed)
The following medication is re-onboarded in this note:  Mycophenolate $0/30ds part b    Psychologist, prison and probation services and Home Delivery Pharmacy    Patient Onboarding/Medication Counseling    Sophia Davis is a 77 y.o. female with kidney transplant who I am counseling today on  re-starting  of therapy.  I am speaking to the patient.    Was a Nurse, learning disability used for this call? No    Verified patient's date of birth / HIPAA.    Specialty medication(s) to be sent: n/a- see delivery info on clinical note      Non-specialty medications/supplies to be sent: n/a      Medications not needed at this time: n/a         The patient declined counseling on medication administration, missed dose instructions, goals of therapy, side effects and monitoring parameters, warnings and precautions, drug/food interactions, and storage, handling precautions, and disposal because they have taken the medication previously. The information in the declined sections below are for informational purposes only and was not discussed with patient.   Myfortic (mycophenolic acid)    Medication & Administration     Dosage:   Take 1 tablet (360mg  total) by mouth twice daily    Administration:   Take with or without food, although taking with food helps minimize GI side effects.  Swallow the pills whole, do not chew or crush    Adherence/Missed dose instructions:  Take a missed dose as soon as you think about it.  If it is less than 2 hours until your next dose, skip the missed dose and go back to your normal time.  Do not take 2 doses at the same time or extra doses.    Goals of Therapy     To prevent organ rejection    Side Effects & Monitoring Parameters     Common side effects  Back or joint pain  Constipation  Headache/dizziness  Not hungry  Stomach pain, diarrhea, constipation, gas, upset stomach, vomiting, nausea  Feeling tired or weak  Shakiness  Trouble sleeping  Increased risk of infection    The following side effects should be reported to the provider:  Allergic reaction  High blood sugar (confusion, feeling sleepy, more thirst, more hungry, passing urine more often, flushing, fast breathing, or breath that smells like fruit)  Electrolyte issues (mood changes, confusion, muscle pain or weakness, a heartbeat that does not feel normal, seizures, not hungry, or very bad upset stomach or throwing up)  High or low blood pressure (bad headache or dizziness, passing out, or change in eyesight)  Kidney issues (unable to pass urine, change in how much urine is passed, blood in the urine, or a big weight gain)  Skin (oozing, heat, swelling, redness, or pain), UTI and other infections   Chest pain or pressure  Abnormal heartbeat  Unexplained bleeding or bruising  Abnormal burning, numbness, or tingling  Muscle cramps,  Yellowing of skin or eyes    Monitoring parameters  Pregnancy test initially prior to treatment and 8-10 days later then as needed)  CBC weekly for first month then twice monthly for next 2 months, then monthly)  Monitor Renal and liver functions  Signs of organ rejection    Contraindications, Warnings, & Precautions     *This is a REMS drug and an FDA-approved patient medication guide will be printed with each dispensation  Black Box Warning: Infections   Black Box Warning: Lymphoproliferative disorders - risk of development of lymphoma and skin malignancy is increased  Black Box Warning: Use during pregnancy is associated with increased risks of first trimester pregnancy loss and congenital malformations.   Black Box Warning: Females of reproductive potential should use contraception during treatment and for 6 weeks after therapy is discontinued  Is patient using an effective method of contraception? Not Applicable  If yes, method of contraception:  hysterectomy in 1998, confirmed per provider [place] note on 02/25/22  CNS depression  New or reactivated viral infections  Neutropenia  Female patients and/or their female partners should use effective contraception during treatment of the female patient and for at least 3 months after last dose.  Breastfeeding is not recommended during therapy and for 6 weeks after last dose    Drug/Food Interactions     Medication list reviewed in Epic. The patient was instructed to inform the care team before taking any new medications or supplements.  No interactions noted that clinic is not already monitoring .   Do not take Echinacea while on this medication  Check with your doctor before getting any vaccinations (live or inactivated)    Storage, Handling Precautions, & Disposal     Store at room temperature  Keep away from children and pets  This drug is considered hazardous and should be handled as little as possible.  Wash hands before and after touching pills. If someone else helps with medication administration, they should wear gloves.      Current Medications (including OTC/herbals), Comorbidities and Allergies     No current facility-administered medications for this visit.     No current outpatient medications on file.       No Known Allergies    Patient Active Problem List   Diagnosis    Stage 3b chronic kidney disease    Granulomatosis with Polyangiitis    Herpes zoster    Hypercholesterolemia    Essential hypertension (RAF-HCC)    Coronary artery disease    Status post total abdominal hysterectomy    Osteoporosis    Stented coronary artery    Secondary hyperparathyroidism (CMS-HCC)    Kidney transplant 05/17/2013    Immunosuppression (CMS-HCC)    Cervical cancer (CMS-HCC)    Seasonal allergies    COVID-19    Primary insomnia    Weight loss    Persistent atrial fibrillation (CMS-HCC)       Reviewed and up to date in Epic.    Appropriateness of Therapy     Acute infections noted within Epic:  No active infections  Patient reported infection: None    Is the medication and dose appropriate based on diagnosis, medication list, comorbidities, allergies, medical history, patient???s ability to self-administer the medication, and therapeutic goals? Yes    Prescription has been clinically reviewed: Yes      Baseline Quality of Life Assessment      How many days over the past month did your transplant  keep you from your normal activities? For example, brushing your teeth or getting up in the morning. 0    Financial Information     Medication Assistance provided: None Required    Anticipated copay of $0/30ds part b reviewed with patient. Verified delivery address.    Delivery Information     Scheduled delivery date: see delivery info on clinical note    Expected start date: patient will re-start once shipment arrives, clinic aware      Medication will be delivered via  n/a  to the  n/a  address in Epic [place].  This shipment will require a  signature.      Explained the services we provide at [facility] and that each month we would call to set up refills.  Stressed importance of returning phone calls so that we could ensure they receive their medications in time each month.  Informed patient that we should be setting up refills 7-10 days prior to when they will run out of medication.  A pharmacist will reach out to perform a clinical assessment periodically.  Informed patient that a welcome packet, containing information about our pharmacy and other support services, a Notice of Privacy Practices, and a drug information handout will be sent.      The patient or caregiver noted above participated in the development of this care plan and knows that they can request review of or adjustments to the care plan at any time.      Patient or caregiver verbalized understanding of the above information as well as how to contact the pharmacy at [phone] option 4 with any questions/concerns.  The pharmacy is open Monday through Friday 8:30am-4:30pm.  A pharmacist is available 24/7 via pager to answer any clinical questions they may have.    Patient Specific Needs     Does the patient have any physical, cognitive, or cultural barriers? No    Does the patient have adequate living arrangements? (i.e. the ability to store and take their medication appropriately) Yes    Did you identify any home environmental safety or security hazards? No    Patient prefers to have medications discussed with  Patient     Is the patient or caregiver able to read and understand education materials at a high school level or above? Yes    Patient's primary language is  English     Is the patient high risk? Yes, patient is taking a REMS drug. Medication is dispensed in compliance with REMS program    SOCIAL DETERMINANTS OF HEALTH     At the [facility], we have learned that life circumstances - like trouble affording food, housing, utilities, or transportation can affect the health of many of our patients.   That is why we wanted to ask: are you currently experiencing any life circumstances that are negatively impacting your health and/or quality of life? Patient declined to answer    Social Drivers of Health     Food Insecurity: No Food Insecurity (12/02/2022)    Hunger Vital Sign     Worried About Running Out of Food in the Last Year: Never true     Ran Out of Food in the Last Year: Never true   Internet Connectivity: Not on file   Housing/Utilities: Low Risk  (12/02/2022)    Housing/Utilities     Within the past 12 months, have you ever stayed: outside, in a car, in a tent, in an overnight shelter, or temporarily in someone else's home (i.e. couch-surfing)?: No     Are you worried about losing your housing?: No     Within the past 12 months, have you been unable to get utilities (heat, electricity) when it was really needed?: No   Tobacco Use: Low Risk  (10/01/2023)    Patient History     Smoking Tobacco Use: Never     Smokeless Tobacco Use: Never     Passive Exposure: Not on file   Transportation Needs: No Transportation Needs (12/02/2022)    PRAPARE - Therapist, art (Medical): No  Lack of Transportation (Non-Medical): No   Alcohol Use: Not At Risk (12/02/2022)    Alcohol Use     How often do you have a drink containing alcohol?: Never     How many drinks containing alcohol do you have on a typical day when you are drinking?: 1 - 2     How often do you have 5 or more drinks on one occasion?: Never   Interpersonal Safety: Not on file   Physical Activity: Not on file   Intimate Partner Violence: Not At Risk (12/02/2022)    Humiliation, Afraid, Rape, and Kick questionnaire     Fear of Current or Ex-Partner: No     Emotionally Abused: No     Physically Abused: No     Sexually Abused: No   Stress: Not on file   Substance Use: Low Risk  (07/28/2023)    Substance Use     In the past year, how often have you used prescription drugs for non-medical reasons?: Never     In the past year, how often have you used illegal drugs?: Never     In the past year, have you used any substance for non-medical reasons?: No   Social Connections: Not on file   Financial Resource Strain: Low Risk  (12/02/2022)    Overall Financial Resource Strain (CARDIA)     Difficulty of Paying Living Expenses: Not hard at all   Depression: Not at risk (12/02/2022)    PHQ-2     PHQ-2 Score: 0   Health Literacy: Not on file       Would you be willing to receive help with any of the needs that you have identified today? Not applicable       Thad Ranger, PharmD  [facility] Specialty Pharmacist

## 2023-10-06 NOTE — Unmapped (Signed)
Called to confirm that pt knows to start taking Myfortic when it arrives from [facility], she is to take 1 pill twice per day. No answer, detailed VM left for pt.Marland Kitchen

## 2023-10-10 DIAGNOSIS — R0602 Shortness of breath: Principal | ICD-10-CM

## 2023-10-10 MED ORDER — ALBUTEROL SULFATE HFA 90 MCG/ACTUATION AEROSOL INHALER
1 refills | 0.00 days
Start: 2023-10-10 — End: ?

## 2023-10-10 NOTE — Unmapped (Signed)
[facility] transplant nephrology clinic visit    assessment and plan  1. kidney transplant 05/17/13. +prerenal acute kidney injury baseline creatinine 1.2-1.4 mg/dl. stage iiib chronic kidney disease. no proteinuria. no donor specific hla ab detected '23. trugraf kidney biomarker normal.  2. immunosuppression. hold mycophenolate. tacrolimus 12hr lvl 4-7 ng/ml.  3. hypertension. hold losartan and diuretic antihypertensive medication. blood pressure goal < 130/80 mmhg.  4. cmv viremia. hold mycophenolate. valganciclovir 450mg  bid.  5. health maintenance. influenza '21. pcv13 pneumococcal '20 administered. zoster '19. covid-19 janssen/moderna vaccine '21. mammogram '21. gyn exam '13/post hysterectomy. cystoscopy '14. colonoscopy '20. dermatology '21. kidney ultrasound '22.    history of present illness    [name] is a 77 year old woman seen in follow up post kidney transplant 05/17/2013. +fatigue. +myalgias. +diarrhea with diminished appetite.    past medical hx:   1. deceased donor kidney transplant 05/17/2013. mpo-anca+ chronic gn. alemtuzumab induction. baseline creatinine 1.2-1.4 mg/dl.  2. mpo-anca+ vasculitis/granulomatosis with polyangiitis. hx pulmonary hemorrhage. trtmt plasma exchange, iv methylprednisolone, iv/po cyclophosphamide x total 9m '03-'07; rituximab 1gm x2 '07.  3. hypertension   4. coronary artery disease s/p pci/stent '97. nl cards stress '13: lvef > 65%. no myocardial ischemia   5. hyperlipidemia  6. osteoporosis  7. secondary/tertiary hyperparathyroidism    past surgical hx: total abdominal hysterectomy '98. 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 kidney transplant '14. right hand 3rd/4th trigger finger release '16.    allergies: no known drug allergies    medications: tacrolimus 1.5mg /1mg  am/pm, mycophenolate sodium 360mg  bid, losartan 50mg  bid, amlodipine 2.5mg  q.pm, triamterene/hydrochlorothiazide 37.5mg /25mg  daily, atorvastatin 40mg  daily, aspirin 81mg  daily, vitamin d 1,000u daily, famotidine 20mg  prn, alendronate 70mg  q.week.    soc hx: widowed x3 children. distant smoking hx; none > 40 yrs.    physical exam: t96.1 p57 bp149/55 wt51.3kg bmi21.3. wd/wn woman appropriate affect and mood. nl sclera anicteric. mmm no thrush. neck supple no palpable ln. heart rrr nl s1s2 no m/r/g. lungs clear bilateral. abdomen soft nt/nd. no lower ext edema. msk no synovitis or tophi. skin no rash. neuro alert oriented non focal exam.

## 2023-10-11 MED ORDER — ALBUTEROL SULFATE HFA 90 MCG/ACTUATION AEROSOL INHALER
6 refills | 0.00 days | Status: CP
Start: 2023-10-11 — End: ?

## 2023-10-22 ENCOUNTER — Inpatient Hospital Stay: Admit: 2023-10-22 | Discharge: 2023-10-24 | Payer: MEDICARE

## 2023-10-22 MED ADMIN — Tc-99m Sestamibi (Cardiolite): 11 | INTRAVENOUS | @ 13:00:00 | Stop: 2023-10-22

## 2023-10-22 MED ADMIN — regadenoson (LEXISCAN) injection: .4 mg | INTRAVENOUS | @ 13:00:00 | Stop: 2023-10-22

## 2023-10-22 MED ADMIN — regadenoson (LEXISCAN) injection: INTRAVENOUS | @ 13:00:00 | Stop: 2023-10-22

## 2023-10-22 MED ADMIN — Tc-99m Sestamibi (Cardiolite): 31.2 | INTRAVENOUS | @ 15:00:00 | Stop: 2023-10-22

## 2023-10-26 NOTE — Unmapped (Signed)
[facility] Refill Coordination Note    Specialty Medication(s) to be Shipped:   Transplant: mycophenolate mofetil 360mg  and tacrolimus 0.5mg     Other medication(s) to be shipped:  losartan, atorvastatin and vitamin d     Sophia Davis, DOB: 1946-04-09  Phone: [phone] (home)       All above HIPAA information was verified with patient.     Was a Nurse, learning disability used for this call? No    Completed refill call assessment today to schedule patient's medication shipment from the [facility]  [phone]).  All relevant notes have been reviewed.     Specialty medication(s) and dose(s) confirmed: Regimen is correct and unchanged.   Changes to medications: Miryam reports no changes at this time.  Changes to insurance: No  New side effects reported not previously addressed with a pharmacist or physician: None reported  Questions for the pharmacist: No    Confirmed patient received a Conservation officer, historic buildings and a Surveyor, mining with first shipment. The patient will receive a drug information handout for each medication shipped and additional FDA Medication Guides as required.       DISEASE/MEDICATION-SPECIFIC INFORMATION        N/A    SPECIALTY MEDICATION ADHERENCE     Medication Adherence    Patient reported X missed doses in the last month: 0  Specialty Medication: tacrolimus 0.5 MG capsule (PROGRAF)  Patient is on additional specialty medications: Yes  Additional Specialty Medications: mycophenolate 360 MG Tbec (MYFORTIC)  Patient Reported Additional Medication X Missed Doses in the Last Month: 0  Patient is on more than two specialty medications: No  Informant: patient  Adherence tools used: patient uses a pill box to manage medications              Were doses missed due to medication being on hold? No      tacrolimus 0.5 MG capsule (PROGRAF): 7 days of medicine on hand    mycophenolate 360 MG Tbec (MYFORTIC): 7 days of medicine on hand     REFERRAL TO PHARMACIST     Referral to the pharmacist: Not needed      [facility]     Shipping address confirmed in Epic.       Delivery Scheduled: Yes, Expected medication delivery date: 10/29/23.     Medication will be delivered via Same Day Courier to the prescription address in Epic [place].    Craige Cotta   [facility]  Specialty Technician

## 2023-10-29 MED FILL — CHOLECALCIFEROL (VITAMIN D3) 25 MCG (1,000 UNIT) TABLET: ORAL | 100 days supply | Qty: 100 | Fill #1

## 2023-10-29 MED FILL — MYCOPHENOLATE SODIUM 360 MG TABLET,DELAYED RELEASE: ORAL | 30 days supply | Qty: 60 | Fill #1

## 2023-10-29 MED FILL — TACROLIMUS 0.5 MG CAPSULE, IMMEDIATE-RELEASE: ORAL | 30 days supply | Qty: 150 | Fill #1

## 2023-10-29 MED FILL — ATORVASTATIN 40 MG TABLET: ORAL | 90 days supply | Qty: 90 | Fill #1

## 2023-10-29 MED FILL — LOSARTAN 50 MG TABLET: ORAL | 30 days supply | Qty: 60 | Fill #1

## 2023-11-01 DIAGNOSIS — Z940 Kidney transplant status: Principal | ICD-10-CM

## 2023-11-01 DIAGNOSIS — D849 Immunodeficiency, unspecified: Principal | ICD-10-CM

## 2023-11-03 ENCOUNTER — Inpatient Hospital Stay: Admit: 2023-11-03 | Discharge: 2023-11-04 | Payer: MEDICARE

## 2023-11-03 ENCOUNTER — Ambulatory Visit: Admit: 2023-11-03 | Discharge: 2023-11-04 | Payer: MEDICARE

## 2023-11-03 DIAGNOSIS — R197 Diarrhea, unspecified: Principal | ICD-10-CM

## 2023-11-03 DIAGNOSIS — I4819 Other persistent atrial fibrillation: Principal | ICD-10-CM

## 2023-11-03 DIAGNOSIS — D849 Immunodeficiency, unspecified: Principal | ICD-10-CM

## 2023-11-03 DIAGNOSIS — I10 Essential (primary) hypertension: Principal | ICD-10-CM

## 2023-11-03 DIAGNOSIS — Z940 Kidney transplant status: Principal | ICD-10-CM

## 2023-11-03 DIAGNOSIS — R300 Dysuria: Principal | ICD-10-CM

## 2023-11-03 DIAGNOSIS — Z711 Person with feared health complaint in whom no diagnosis is made: Principal | ICD-10-CM

## 2023-11-03 LAB — URINALYSIS WITH MICROSCOPY WITH CULTURE REFLEX PERFORMABLE
BILIRUBIN UA: NEGATIVE
GLUCOSE UA: NEGATIVE
HYALINE CASTS: 3 /LPF — ABNORMAL HIGH (ref 0–1)
KETONES UA: NEGATIVE
NITRITE UA: POSITIVE — AB
PH UA: 5.5 (ref 5.0–9.0)
PROTEIN UA: 70 — AB
RBC UA: 4 /HPF (ref <=4)
SPECIFIC GRAVITY UA: 1.013 (ref 1.003–1.030)
SQUAMOUS EPITHELIAL: 1 /HPF (ref 0–5)
UROBILINOGEN UA: <2
WBC UA: 32 /HPF — ABNORMAL HIGH (ref 0–5)

## 2023-11-03 LAB — CBC W/ AUTO DIFF
BASOPHILS ABSOLUTE COUNT: 0.1 10*9/L (ref 0.0–0.1)
BASOPHILS RELATIVE PERCENT: 0.7 %
EOSINOPHILS ABSOLUTE COUNT: 0.1 10*9/L (ref 0.0–0.5)
EOSINOPHILS RELATIVE PERCENT: 1.4 %
HEMATOCRIT: 37.2 % (ref 34.0–44.0)
HEMOGLOBIN: 12.1 g/dL (ref 11.3–14.9)
LYMPHOCYTES ABSOLUTE COUNT: 1.6 10*9/L (ref 1.1–3.6)
LYMPHOCYTES RELATIVE PERCENT: 19.9 %
MEAN CORPUSCULAR HEMOGLOBIN CONC: 32.6 g/dL (ref 32.0–36.0)
MEAN CORPUSCULAR HEMOGLOBIN: 27.6 pg (ref 25.9–32.4)
MEAN CORPUSCULAR VOLUME: 84.6 fL (ref 77.6–95.7)
MEAN PLATELET VOLUME: 8.6 fL (ref 6.8–10.7)
MONOCYTES ABSOLUTE COUNT: 0.6 10*9/L (ref 0.3–0.8)
MONOCYTES RELATIVE PERCENT: 6.7 %
NEUTROPHILS ABSOLUTE COUNT: 5.8 10*9/L (ref 1.8–7.8)
NEUTROPHILS RELATIVE PERCENT: 71.3 %
PLATELET COUNT: 198 10*9/L (ref 150–450)
RED BLOOD CELL COUNT: 4.39 10*12/L (ref 3.95–5.13)
RED CELL DISTRIBUTION WIDTH: 14.3 % (ref 12.2–15.2)
WBC ADJUSTED: 8.2 10*9/L (ref 3.6–11.2)

## 2023-11-03 LAB — COMPREHENSIVE METABOLIC PANEL
ALBUMIN: 4 g/dL (ref 3.4–5.0)
ALKALINE PHOSPHATASE: 70 U/L (ref 46–116)
ALT (SGPT): <7 U/L — ABNORMAL LOW (ref 10–49)
ANION GAP: 13 mmol/L (ref 5–14)
AST (SGOT): 13 U/L (ref <=34)
BILIRUBIN TOTAL: 0.7 mg/dL (ref 0.3–1.2)
BLOOD UREA NITROGEN: 21 mg/dL (ref 9–23)
BUN / CREAT RATIO: 15
CALCIUM: 9.7 mg/dL (ref 8.7–10.4)
CHLORIDE: 102 mmol/L (ref 98–107)
CO2: 24.1 mmol/L (ref 20.0–31.0)
CREATININE: 1.36 mg/dL — ABNORMAL HIGH (ref 0.55–1.02)
EGFR CKD-EPI (2021) FEMALE: 40 mL/min/{1.73_m2} — ABNORMAL LOW (ref >=60)
GLUCOSE RANDOM: 100 mg/dL (ref 70–179)
POTASSIUM: 3.6 mmol/L (ref 3.4–4.8)
PROTEIN TOTAL: 7.2 g/dL (ref 5.7–8.2)
SODIUM: 139 mmol/L (ref 135–145)

## 2023-11-03 LAB — TSH: THYROID STIMULATING HORMONE: 3.064 u[IU]/mL (ref 0.550–4.780)

## 2023-11-03 MED ORDER — SULFAMETHOXAZOLE 800 MG-TRIMETHOPRIM 160 MG TABLET
ORAL_TABLET | Freq: Two times a day (BID) | ORAL | 0 refills | 5.00 days | Status: CN
Start: 2023-11-03 — End: 2023-11-08

## 2023-11-03 MED ORDER — AMLODIPINE 5 MG TABLET
ORAL_TABLET | Freq: Every evening | ORAL | 3 refills | 90 days | Status: CP
Start: 2023-11-03 — End: 2024-10-28

## 2023-11-03 MED ORDER — NITROFURANTOIN MONOHYDRATE/MACROCRYSTALS 100 MG CAPSULE
ORAL_CAPSULE | Freq: Two times a day (BID) | ORAL | 0 refills | 7.00 days | Status: CP
Start: 2023-11-03 — End: 2023-11-10

## 2023-11-03 NOTE — Unmapped (Addendum)
Start taking amlodipine 5mg  daily. Send me blood pressure readings in one month.    Continue to follow the plan of your cardiologist for treating your atrial fibrillation.    Next visit we can talk one-on-one about some of the memory concerns your family is having.    We are getting a urinalysis for the burning urination you had today.    For your diarrhea, we would like you to pay attention to what you eat the day before. We suggest writing down what you have eaten time you have diarrhea.

## 2023-11-03 NOTE — Unmapped (Signed)
Internal Medicine Clinic Visit    Reason for visit: Follow-up for A. Fib, HTN    Assessment & Plan  Essential hypertension  Elevated above goal. Current regimen of losartan 50mg  BID, triamterene-hydrochlorothiazide 375-25mg , and metoprolol 25mg . Not taking amlodipine 5mg . Had some dizziness while ill but it has since resolved. Planning to restart amlodipine 5mg  for improve blood pressure management. Will monitor blood pressure at home and send [place] measurements.  Orders:    amlodipine (NORVASC) 5 MG tablet; Take 1 tablet (5 mg total) by mouth nightly.    Burning with urination  Began last night, no discoloration or blood in the urine. UA today concerning for UTI, sending for reflex culture. Prescribing nitrofurantoin 100mg  BID for 7 days.  Orders:    Urinalysis with Microscopy with Culture Reflex    Urine Culture    Persistent atrial fibrillation (CMS-HCC)  Current regimen of Metroprolol 25mg  BID and eliquis 2.5mg  BID. S/p cardioversion, fibrillation returned. Medication stress test done 10/22/23 with medium in size, mild intensity inducible defect of the lateral wall. Had an echo done this morning with major findings of moderate left atrial dilation, mild mitral valve regurg, and moderate aortic regurg. Following up with cardiology 11/22/2023 with Yevette Edwards. TSH normal today, unlikely to be due to thyroid etiology. Will continue current medications and follow cardiology rec's.  Orders:    TSH    Concern about memory  Daughter concerned about memory issues, mood swings. Patient denies feeling forgetful or confused. Planned to meet 1 on 1 with patient in about a month for cognitive testing.       Immunosuppression (CMS-HCC)  Current regimen of mycophenolate 360mg  BID, and tacrolimus 1.5mg  daily and 1mg  nightly. Follows with renal transplant team who determines her medications. Obtaining tac level today.  Orders:    Tacrolimus Level, Trough    Kidney transplant 05/17/2013  Obtaining transplant monitoring labs today. CBC and electrolytes look normal. sCr 1.36 today, consistent with her baseline of ~1.35. Will continue to monitor.  Orders:    CBC w/ Differential    Comprehensive Metabolic Panel    Diarrhea, unspecified type  Chronic history of watery diarrhea multiple times a day with decreased appetite. Resolved for 2-3 days with anti-diarrheal agents. Requested she keep a diary to see if there are associated foods with the diarrhea. Possible she has developed a lactase deficiency. Will touch base on follow-up when we have dietary information.  This is most likely a side effect of the mycophenolate         The documentation recorded by the scribe accurately reflects the service I personally performed and the decisions made by me.  Dolores Hoose, MD      __________________________________________________________    HPI:    78 y.o. female here for follow-up for HTN and A. Fib.    Has not taken her medications today, states she has to wait to get her bloodwork first.     HTN: elevated today. States her home Bps are typically 140's-150's/60's. Currently losartan 50mg  BID, triamterene-hydrochlorothiazide 375-25mg . Also on metoprolol 25mg . Not taking amlodipine 5mg . Daughter states she is having dizziness, mainly when standing from sitting. Also when she feels worked up. States her dizziness was mainly when she had her cold.     Cognition: Daughter feels she is forgetful. States she does not get lost in familiar placees. Daughter feels she is not the only one that has noticed. Patient disagrees. Attributes potential difficulty navigating her house as the amount of people in there and clutter.  A. Fib - s/p cardioversion, fibrillation returned. Have had a medication stress test done. This was done 10/22/23. Had an echo done this morning. Following up with cardiology 11/22/2023 with Yevette Edwards. Metroprolol 25mg  BID and eliquis 2.5mg  BID. Perfusion test: medium in size, mild intensity inducible defect of the lateral wall. Differential diagnosis includes ischemia; however, artifact have similar appearance.    Osteoporosis: Endocrinology referral, planned for March 12/22/2023.    Kidney transplant: Mycophenolate 360mg  BID, and tacrolimus 1.5mg  daily and 1mg  nightly.    Urinary burning: Began last night. Denies any blood. Notes it may be because she is not drinking enough water.     Diarrhea: Watery diarrhea about three times a day. Believes it has been going on for years. Was informed by another provider that it may have been one of her pills. Takes an anti-diarrheal and is able to go 2-3 days without issue, but having no bowel movements. Daughter states her appetite is reduced as well, at times eating once a day. Consumes dairy products (ice cream, cheese).  __________________________________________________________      Medications:  See below.    __________________________________________________________    Physical Exam:   Vital Signs:  Vitals:    11/03/23 0839   BP: 157/69   BP Position: Sitting   Pulse: 82   Resp: 16   Temp: 35.6 ??C (96 ??F)   TempSrc: Temporal   SpO2: 99%   Weight: 47.2 kg (104 lb)   Height: 154.9 cm (5' 1)       Gen: Well appearing, NAD  CV: Irregularly irregular rate, no murmurs  Pulm: CTA bilaterally, no crackles or wheezes  Pulses: 2+ radial  Ext: No edema       Your Medication List            Accurate as of November 03, 2023 11:37 AM. If you have any questions, ask your nurse or doctor.                CONTINUE taking these medications      AEROCHAMBER MV inhaler  Generic drug: inhalational spacing device  1 each by Miscellaneous route in the morning.     albuterol 90 mcg/actuation inhaler  Commonly known as: PROVENTIL HFA;VENTOLIN HFA  INHALE 2 PUFFS BY MOUTH EVERY 6 HOURS AS NEEDED FOR WHEEZE     amlodipine 5 MG tablet  Commonly known as: NORVASC  Take 1 tablet (5 mg total) by mouth nightly.     apixaban 2.5 mg Tab  Commonly known as: ELIQUIS  Take 1 tablet (2.5 mg total) by mouth two (2) times a day.     atorvastatin 40 MG tablet  Commonly known as: LIPITOR  Take 1 tablet by mouth every evening     cholecalciferol (vitamin D3 25 mcg (1,000 units)) 1,000 unit (25 mcg) tablet  Take 1 tablet (25 mcg total) by mouth daily.     losartan 50 MG tablet  Commonly known as: COZAAR  Take 1 tablet (50 mg total) by mouth two (2) times a day.     metoPROLOL tartrate 25 MG tablet  Commonly known as: Lopressor  Take 1 tablet (25 mg total) by mouth two (2) times a day.     mycophenolate 360 MG Tbec  Commonly known as: MYFORTIC  Take 1 tablet (360 mg total) by mouth two (2) times a day.     tacrolimus 0.5 MG capsule  Commonly known as: PROGRAF  Take 3 capsules (1.5 mg total) by mouth in the morning  AND 2 capsules (1 mg total) in the evening.     traZODone 50 MG tablet  Commonly known as: DESYREL  Take 1 tablet (50 mg total) by mouth nightly.     triamterene-hydroCHLOROthiazide 37.5-25 mg per tablet  Commonly known as: MAXZIDE-25  TAKE 1/2 TABLET BY MOUTH DAILY     valGANciclovir 450 mg tablet  Commonly known as: VALCYTE  Take 2 tablets (900 mg total) by mouth daily.                This note was dictated using voice recognition software.  Please forgive typos.    Nicholes Calamity, MS3

## 2023-11-03 NOTE — Unmapped (Addendum)
Elevated above goal. Current regimen of losartan 50mg  BID, triamterene-hydrochlorothiazide 375-25mg , and metoprolol 25mg . Not taking amlodipine 5mg . Had some dizziness while ill but it has since resolved. Planning to restart amlodipine 5mg  for improve blood pressure management. Will monitor blood pressure at home and send [place] measurements.  Orders:    amlodipine (NORVASC) 5 MG tablet; Take 1 tablet (5 mg total) by mouth nightly.

## 2023-11-03 NOTE — Unmapped (Signed)
[facility]     Reason for visit: Follow up    Questions / Concerns that need to be addressed:       Omron BPs (complete if screening BP has a systolic  > 130 or diastolic > 80)  BP#1 160/75   BP#2 158/71  BP#3 154/62    Average BP 157/69  (please note this as a comment in vitals)     PTHomeBP         HCDM reviewed and updated in Epic:    We are working to make sure all of our patients??? wishes are updated in Epic and part of that is documenting a Environmental health practitioner for each patient  A Health Care Decision Maker is someone you choose who can make health care decisions for you if you are not able - who would you most want to do this for you????  is already up to date.    HCDM (other legal document): Maia Petties - [phone]    [facility] (patient stated preference): Lacey Jensen - Daughter - [phone]    BPAs completed:      Annual Screenings:     __________________________________________________________________________________________    SCREENINGS COMPLETED IN FLOWSHEETS      AUDIT       PHQ2       PHQ9          GAD7       COPD Assessment       Falls Risk

## 2023-11-03 NOTE — Unmapped (Addendum)
Obtaining transplant monitoring labs today. CBC and electrolytes look normal. sCr 1.36 today, consistent with her baseline of ~1.35. Will continue to monitor.  Orders:    CBC w/ Differential    Comprehensive Metabolic Panel

## 2023-11-03 NOTE — Unmapped (Addendum)
Current regimen of Metroprolol 25mg  BID and eliquis 2.5mg  BID. S/p cardioversion, fibrillation returned. Medication stress test done 10/22/23 with medium in size, mild intensity inducible defect of the lateral wall. Had an echo done this morning with major findings of moderate left atrial dilation, mild mitral valve regurg, and moderate aortic regurg. Following up with cardiology 11/22/2023 with Yevette Edwards. TSH normal today, unlikely to be due to thyroid etiology. Will continue current medications and follow cardiology rec's.  Orders:    TSH

## 2023-11-03 NOTE — Unmapped (Addendum)
Current regimen of mycophenolate 360mg  BID, and tacrolimus 1.5mg  daily and 1mg  nightly. Follows with renal transplant team who determines her medications. Obtaining tac level today.  Orders:    Tacrolimus Level, Trough

## 2023-11-04 LAB — TACROLIMUS LEVEL, TROUGH: TACROLIMUS, TROUGH: 4.2 ng/mL — ABNORMAL LOW (ref 5.0–15.0)

## 2023-11-05 DIAGNOSIS — I4819 Other persistent atrial fibrillation: Principal | ICD-10-CM

## 2023-11-05 MED ORDER — METOPROLOL TARTRATE 25 MG TABLET
ORAL_TABLET | Freq: Two times a day (BID) | ORAL | 3 refills | 90.00 days | Status: CP
Start: 2023-11-05 — End: 2024-11-02

## 2023-11-05 NOTE — Unmapped (Signed)
Per Internal med notes, pt is on macrobid for UTI

## 2023-11-06 NOTE — Unmapped (Signed)
Per interface/mychart refill request.  Patient last seen by PCP on 11/03/23

## 2023-11-18 NOTE — Unmapped (Signed)
Referring Provider: Edmonia James, MD  [address]  [address]  [place],  [place] [address]   Primary Provider: Edmonia James, MD  [address]  [place] [address]   Other Providers:  Dr Reginal Lutes EP Follow Up Note    Reason for Visit:  Sophia Davis is a 78 y.o. female being seen for routine visit and continued care of persistent atrial fibrillation .    Assessment & Plan:    1. Persistent Atrial Fibrillation  s/p DCCV  CHA2DS2-VASc - 4 (age1, sex, cad)  Apixaban 2.5mg  bid  Recommend lifelong anticoagulation    IRAF post cardioversion. Remains in AF today. Unclear if she is symptomatic from Afib though echocardiogram shows normal systolic function and she does not complain of palpitations, CP, fatigue, or SOB.  Discussed options for ongoing AF treatment including rate control only vs rhythm control with ablation or antiarrhythmics. She is not sure which direction she would like to go, rate vs rhythm control. She believe she feels okay but is concerned she won't know until she spends time in SR again. Her antiarrhythmic options are limited secondary to immunosuppression secondary to renal transplant as well as CAD seen on nuclear stress. Discussed possibility of dofetilide load and likely repeat cardioversion vs catheter ablation. Discussed each option in detail including procedural and medication risks. She is interested but nervous about risks. She would like to think about these options.  By the end of the prolonged discussion she is leaning toward focusing on rate control only but she would like to consider her options and return for previously scheduled visit with Dr Kathi Ludwig in April to discuss again.   Have given her informational handout on catheter ablation and dofetilide.  No medication changes today.       ECG: Afib, 78bpm.  See official ECG report.    Follow-up: previously scheduled follow up with Dr Kathi Ludwig   Return for Next scheduled follow up.    History of Present Illness:  Sophia Davis is a 78 y.o. female with a past medical history of CAD, HTN, osteoporosis, stage 3b CKD, granulomatosis with polyangiitis, malignant neoplasm of cervix status post hysterectomy, kidney transplant with immunosuppression who is seen in EP clinic for persistent atrial fibrillation.     Interval History: Frustrated she remains in Afib. No specific AF symptoms that she is aware of. Generally feeling well with no complaints.       Cardiovascular History & Procedures:    Cath / PCI:  **    CV Surgery:  **    EP Procedures and Devices:  10/05/2023 DCCV with IRAF  **    Non-Invasive Evaluation(s):  Echo:  11/03/2023 TTE    1. The left ventricle is normal in size with upper normal wall thickness.    2. LV systolic function is normal, LVEF is visually estimated at > 55%.    3. The left atrium is mildly to moderately dilated in size.    4. The right ventricle is normal in size, with normal systolic function.    5. Mitral valve leaflets are mildly thickened with normal leaflet mobility.    6. There is mild mitral valve regurgitation.    7. The aortic valve is poorly visualized with probably normal excursion with  mildly thickened leaflets with normal excursion.    8. There is moderate aortic regurgitation.    9. TR maximum velocity: 3.0 m/s  Estimated PASP: 38 mmHg.  10. There is a trivial pericardial effusion.    11. No comparison study.  **    Holter:  **    Cardiac CT/MRI/Nuclear Tests:  10/22/2023 Nuclear Stress  -No evidence of scar. However, there is a  medium in size, mild intensity inducible defect of the lateral wall. Differential diagnosis includes ischemia; however, artifact have similar appearance.  - LV systolic function is normal. Post stress EF > 60%.  -Heavy coronary calcifications are noted.  -Additional incidental CT findings as below.  **               Other Past Medical History:  See below for the complete EPIC list of past medical and surgical history.      Allergies:  Patient has no known allergies.    Current Medications:    Current Outpatient Medications on File Prior to Visit   Medication Sig    albuterol HFA 90 mcg/actuation inhaler INHALE 2 PUFFS BY MOUTH EVERY 6 HOURS AS NEEDED FOR WHEEZE    amlodipine (NORVASC) 5 MG tablet Take 1 tablet (5 mg total) by mouth nightly.    apixaban (ELIQUIS) 2.5 mg Tab Take 1 tablet (2.5 mg total) by mouth two (2) times a day.    atorvastatin (LIPITOR) 40 MG tablet Take 1 tablet by mouth every evening    cholecalciferol, vitamin D3 25 mcg, 1,000 units,, 1,000 unit (25 mcg) tablet Take 1 tablet (25 mcg total) by mouth daily.    inhalational spacing device (AEROCHAMBER MV) Spcr 1 each by Miscellaneous route in the morning.    losartan (COZAAR) 50 MG tablet Take 1 tablet (50 mg total) by mouth two (2) times a day.    metoPROLOL tartrate (LOPRESSOR) 25 MG tablet Take 1 tablet (25 mg total) by mouth two (2) times a day.    mycophenolate (MYFORTIC) 360 MG TbEC Take 1 tablet (360 mg total) by mouth two (2) times a day.    [EXPIRED] nitrofurantoin, macrocrystal-monohydrate, (MACROBID) 100 MG capsule Take 1 capsule (100 mg total) by mouth two (2) times a day for 7 days.    tacrolimus (PROGRAF) 0.5 MG capsule Take 3 capsules (1.5 mg total) by mouth in the morning AND 2 capsules (1 mg total) in the evening.    traZODone (DESYREL) 50 MG tablet Take 1 tablet (50 mg total) by mouth nightly.    triamterene-hydroCHLOROthiazide (MAXZIDE-25) 37.5-25 mg per tablet TAKE 1/2 TABLET BY MOUTH DAILY    valGANciclovir (VALCYTE) 450 mg tablet Take 2 tablets (900 mg total) by mouth daily.     No current facility-administered medications on file prior to visit.       Family History:  The patient's family history includes Emphysema in her brother; Heart attack in her father; Heart disease in her father and sister; Hypertension in her mother, sister, sister, and son; No Known Problems in her son and son.    Social history:  She  reports that she has never smoked. She has never used smokeless tobacco. She reports that she does not currently use alcohol. She reports that she does not use drugs.    Review of Systems:  As per HPI and as follows.  Rest of the review of ten systems is negative or unremarkable except as stated above.    Physical Exam:  VITAL SIGNS:   Vitals:    11/22/23 0851   BP: 124/62   Pulse: 76   SpO2: 92%      Wt Readings from Last 3 Encounters:  11/22/23 47.6 kg (105 lb)   11/03/23 47.2 kg (104 lb)   10/05/23 47.6 kg (105 lb)      Today's Body mass index is 19.84 kg/m??.   Height: 154.9 cm (5' 1)    GENERAL: well-appearing in no acute distress  HEENT: Normocephalic and atraumatic. Conjunctivae and sclerae clear and anicteric.    NECK: Supple.   CARDIOVASCULAR: Rate and rhythm are irregularly irregular.  Normal S1, S2. There is no murmur, gallops or rubs  RESPIRATORY: Normal respiratory effort..  There are no wheezes.  ABDOMEN: Soft, non-tender, Abdomen nondistended.    EXTREMITIES: There is no pedal edema, bilaterally.   SKIN: No rashes, ecchymosis or petechiae.  Warm, well perfused.  NEURO/PSYCH: Alert and oriented x 3. Affect appropriate.  Nonfocal    Pertinent Laboratory Studies:   Office Visit on 11/22/2023   Component Date Value Ref Range Status    EKG Ventricular Rate 11/22/2023 78  BPM Preliminary    EKG QRS Duration 11/22/2023 86  ms Preliminary    EKG Q-T Interval 11/22/2023 424  ms Preliminary    EKG QTC Calculation 11/22/2023 483  ms Preliminary    EKG Calculated R Axis 11/22/2023 22  degrees Preliminary    EKG Calculated T Axis 11/22/2023 81  degrees Preliminary    QTC Fredericia 11/22/2023 462  ms Preliminary   Office Visit on 11/03/2023   Component Date Value Ref Range Status    Color, UA 11/03/2023 Light Yellow   Final    Clarity, UA 11/03/2023 Clear   Final    Specific Gravity, UA 11/03/2023 1.013  1.003 - 1.030 Final    pH, UA 11/03/2023 5.5  5.0 - 9.0 Final    Leukocyte Esterase, UA 11/03/2023 Moderate (A)  Negative Final    Nitrite, UA 11/03/2023 Positive (A)  Negative Final    Protein, UA 11/03/2023 70 mg/dL (A)  Negative Final    Glucose, UA 11/03/2023 Negative  Negative Final    Ketones, UA 11/03/2023 Negative  Negative Final    Urobilinogen, UA 11/03/2023 <2.0 mg/dL  <0.2 mg/dL Final    Bilirubin, UA 11/03/2023 Negative  Negative Final    Blood, UA 11/03/2023 Small (A)  Negative Final    RBC, UA 11/03/2023 4  <=4 /HPF Final    WBC, UA 11/03/2023 32 (H)  0 - 5 /HPF Final    Squam Epithel, UA 11/03/2023 1  0 - 5 /HPF Final    Bacteria, UA 11/03/2023 Many (A)  None Seen /HPF Final    Hyaline Casts, UA 11/03/2023 3 (H)  0 - 1 /LPF Final    Mucus, UA 11/03/2023 Rare (A)  None Seen /HPF Final    Tacrolimus, Trough 11/03/2023 4.2 (L)  5.0 - 15.0 ng/mL Final    Sodium 11/03/2023 139  135 - 145 mmol/L Final    Potassium 11/03/2023 3.6  3.4 - 4.8 mmol/L Final    Chloride 11/03/2023 102  98 - 107 mmol/L Final    CO2 11/03/2023 24.1  20.0 - 31.0 mmol/L Final    Anion Gap 11/03/2023 13  5 - 14 mmol/L Final    BUN 11/03/2023 21  9 - 23 mg/dL Final    Creatinine 72/53/6644 1.36 (H)  0.55 - 1.02 mg/dL Final    BUN/Creatinine Ratio 11/03/2023 15   Final    eGFR CKD-EPI (2021) Female 11/03/2023 40 (L)  >=60 mL/min/1.69m2 Final    Glucose 11/03/2023 100  70 - 179 mg/dL Final    Calcium 03/47/4259 9.7  8.7 - 10.4 mg/dL Final    Albumin 29/56/2130 4.0  3.4 - 5.0 g/dL Final    Total Protein 11/03/2023 7.2  5.7 - 8.2 g/dL Final    Total Bilirubin 11/03/2023 0.7  0.3 - 1.2 mg/dL Final    AST 86/57/8469 13  <=34 U/L Final    ALT 11/03/2023 <7 (L)  10 - 49 U/L Final    Alkaline Phosphatase 11/03/2023 70  46 - 116 U/L Final    TSH 11/03/2023 3.064  0.550 - 4.780 uIU/mL Final    WBC 11/03/2023 8.2  3.6 - 11.2 10*9/L Final    RBC 11/03/2023 4.39  3.95 - 5.13 10*12/L Final    HGB 11/03/2023 12.1  11.3 - 14.9 g/dL Final    HCT 62/95/2841 37.2  34.0 - 44.0 % Final    MCV 11/03/2023 84.6  77.6 - 95.7 fL Final    MCH 11/03/2023 27.6  25.9 - 32.4 pg Final    MCHC 11/03/2023 32.6  32.0 - 36.0 g/dL Final    RDW 32/44/0102 14.3  12.2 - 15.2 % Final    MPV 11/03/2023 8.6  6.8 - 10.7 fL Final    Platelet 11/03/2023 198  150 - 450 10*9/L Final    Neutrophils % 11/03/2023 71.3  % Final    Lymphocytes % 11/03/2023 19.9  % Final    Monocytes % 11/03/2023 6.7  % Final    Eosinophils % 11/03/2023 1.4  % Final    Basophils % 11/03/2023 0.7  % Final    Absolute Neutrophils 11/03/2023 5.8  1.8 - 7.8 10*9/L Final    Absolute Lymphocytes 11/03/2023 1.6  1.1 - 3.6 10*9/L Final    Absolute Monocytes 11/03/2023 0.6  0.3 - 0.8 10*9/L Final    Absolute Eosinophils 11/03/2023 0.1  0.0 - 0.5 10*9/L Final    Absolute Basophils 11/03/2023 0.1  0.0 - 0.1 10*9/L Final    Urine Culture, Comprehensive 11/03/2023 >100,000 CFU/mL Escherichia coli (A)   Final   Admission on 10/05/2023, Discharged on 10/05/2023   Component Date Value Ref Range Status    EKG Ventricular Rate 10/05/2023 79  BPM Final    EKG QRS Duration 10/05/2023 82  ms Final    EKG Q-T Interval 10/05/2023 440  ms Final    EKG QTC Calculation 10/05/2023 504  ms Final    EKG Calculated R Axis 10/05/2023 66  degrees Final    EKG Calculated T Axis 10/05/2023 106  degrees Final    QTC Fredericia 10/05/2023 482  ms Final   Office Visit on 09/27/2023   Component Date Value Ref Range Status    EKG Ventricular Rate 09/27/2023 83  BPM Final    EKG Atrial Rate 09/27/2023 300  BPM Final    EKG QRS Duration 09/27/2023 80  ms Final    EKG Q-T Interval 09/27/2023 394  ms Final    EKG QTC Calculation 09/27/2023 462  ms Final    EKG Calculated P Axis 09/27/2023 107  degrees Final    EKG Calculated R Axis 09/27/2023 77  degrees Final    EKG Calculated T Axis 09/27/2023 92  degrees Final    QTC Fredericia 09/27/2023 439  ms Final   Office Visit on 08/18/2023   Component Date Value Ref Range Status    EKG Ventricular Rate 08/18/2023 77  BPM Final    EKG QRS Duration 08/18/2023 84  ms Final    EKG Q-T Interval 08/18/2023 410  ms Final  EKG QTC Calculation 08/18/2023 463  ms Final EKG Calculated R Axis 08/18/2023 47  degrees Final    EKG Calculated T Axis 08/18/2023 -1  degrees Final    QTC Fredericia 08/18/2023 445  ms Final   Appointment on 08/18/2023   Component Date Value Ref Range Status    Magnesium 08/18/2023 1.5 (L)  1.6 - 2.6 mg/dL Final    Color, UA 16/07/9603 Light Yellow   Final    Clarity, UA 08/18/2023 Hazy   Final    Specific Gravity, UA 08/18/2023 1.012  1.003 - 1.030 Final    pH, UA 08/18/2023 6.0  5.0 - 9.0 Final    Leukocyte Esterase, UA 08/18/2023 Moderate (A)  Negative Final    Nitrite, UA 08/18/2023 Positive (A)  Negative Final    Protein, UA 08/18/2023 70 mg/dL (A)  Negative Final    Glucose, UA 08/18/2023 Negative  Negative Final    Ketones, UA 08/18/2023 Negative  Negative Final    Urobilinogen, UA 08/18/2023 <2.0 mg/dL  <5.4 mg/dL Final    Bilirubin, UA 08/18/2023 Negative  Negative Final    Blood, UA 08/18/2023 Moderate (A)  Negative Final    RBC, UA 08/18/2023 16 (H)  <=4 /HPF Final    WBC, UA 08/18/2023 16 (H)  0 - 5 /HPF Final    Squam Epithel, UA 08/18/2023 1  0 - 5 /HPF Final    Bacteria, UA 08/18/2023 Few (A)  None Seen /HPF Final    Mucus, UA 08/18/2023 Rare (A)  None Seen /HPF Final    Urine Culture, Comprehensive 08/18/2023 >100,000 CFU/mL Escherichia coli (A)   Final    Creat U 08/18/2023 94.9  Undefined mg/dL Final    Protein, Ur 09/81/1914 102.6  Undefined mg/dL Final    Protein/Creatinine Ratio, Urine 08/18/2023 1.081  Undefined Final    Sodium 08/18/2023 140  135 - 145 mmol/L Final    Potassium 08/18/2023 3.6  3.4 - 4.8 mmol/L Final    Chloride 08/18/2023 105  98 - 107 mmol/L Final    CO2 08/18/2023 27.5  20.0 - 31.0 mmol/L Final    Anion Gap 08/18/2023 8  5 - 14 mmol/L Final    BUN 08/18/2023 24 (H)  9 - 23 mg/dL Final    Creatinine 78/29/5621 1.32 (H)  0.55 - 1.02 mg/dL Final    BUN/Creatinine Ratio 08/18/2023 18   Final    eGFR CKD-EPI (2021) Female 08/18/2023 41 (L)  >=60 mL/min/1.8m2 Final    Glucose 08/18/2023 90  70 - 99 mg/dL Final    Calcium 30/86/5784 9.6  8.7 - 10.4 mg/dL Final    Phosphorus 69/62/9528 4.6  2.4 - 5.1 mg/dL Final    Albumin 41/32/4401 3.5  3.4 - 5.0 g/dL Final    Tacrolimus, Trough 08/18/2023 4.3 (L)  5.0 - 15.0 ng/mL Final    CMV Viral Ld 08/18/2023 Not Detected  Not Detected Final    Vitamin D Total (25OH) 08/18/2023 32.2  20.0 - 80.0 ng/mL Final    Vitamin D 1,25 (OH) 2, Total 08/18/2023 23  18 - 72 pg/mL Final    Vitamin D 1,25 D3 08/18/2023 23  NO REFERENCE RANGE pg/mL Final    Vitamin D 1,25 D2 08/18/2023 <8  NO REFERENCE RANGE pg/mL Final    PTH 08/18/2023 80.3 (H)  18.4 - 80.1 pg/mL Final    WBC 08/18/2023 6.5  3.6 - 11.2 10*9/L Final    RBC 08/18/2023 3.80 (L)  3.95 - 5.13 10*12/L Final  HGB 08/18/2023 10.8 (L)  11.3 - 14.9 g/dL Final    HCT 66/44/0347 33.2 (L)  34.0 - 44.0 % Final    MCV 08/18/2023 87.4  77.6 - 95.7 fL Final    MCH 08/18/2023 28.5  25.9 - 32.4 pg Final    MCHC 08/18/2023 32.6  32.0 - 36.0 g/dL Final    RDW 42/59/5638 16.1 (H)  12.2 - 15.2 % Final    MPV 08/18/2023 8.4  6.8 - 10.7 fL Final    Platelet 08/18/2023 219  150 - 450 10*9/L Final    Neutrophils % 08/18/2023 55.6  % Final    Lymphocytes % 08/18/2023 28.2  % Final    Monocytes % 08/18/2023 10.9  % Final    Eosinophils % 08/18/2023 4.6  % Final    Basophils % 08/18/2023 0.7  % Final    Absolute Neutrophils 08/18/2023 3.6  1.8 - 7.8 10*9/L Final    Absolute Lymphocytes 08/18/2023 1.8  1.1 - 3.6 10*9/L Final    Absolute Monocytes 08/18/2023 0.7  0.3 - 0.8 10*9/L Final    Absolute Eosinophils 08/18/2023 0.3  0.0 - 0.5 10*9/L Final    Absolute Basophils 08/18/2023 0.0  0.0 - 0.1 10*9/L Final   Office Visit on 07/28/2023   Component Date Value Ref Range Status    EKG Ventricular Rate 07/28/2023 132  BPM Final    EKG QRS Duration 07/28/2023 72  ms Final    EKG Q-T Interval 07/28/2023 282  ms Final    EKG QTC Calculation 07/28/2023 417  ms Final    EKG Calculated R Axis 07/28/2023 43  degrees Final    EKG Calculated T Axis 07/28/2023 48  degrees Final    QTC Fredericia 07/28/2023 367  ms Final    SARS-CoV-2 PCR 07/28/2023 Negative  Negative Final    Influenza A 07/28/2023 Negative  Negative Final    Influenza B 07/28/2023 Negative  Negative Final    RSV 07/28/2023 Negative  Negative Final       Lab Results   Component Value Date    Creatinine 1.36 (H) 11/03/2023    Creatinine 1.32 (H) 08/18/2023    Creatinine 1.14 01/21/2015    Creatinine 1.14 (H) 11/07/2014    BUN 21 11/03/2023    BUN 24 (H) 08/18/2023    BUN 16 12/28/2016    BUN 18 08/31/2016    Sodium 139 11/03/2023    Sodium 138 01/21/2015    Potassium 3.6 11/03/2023    Potassium 4.1 01/21/2015    CO2 24.1 11/03/2023    CO2 24 01/21/2015    Magnesium 1.5 (L) 08/18/2023    Magnesium 1.6 01/21/2015    Total Bilirubin 0.7 11/03/2023    Total Bilirubin 0.9 02/24/2016    INR 1.1 05/16/2013       No results found for: DIGOXIN    Lab Results   Component Value Date    TSH 3.064 11/03/2023    TSH 1.89 10/26/2013    Cholesterol 77 03/05/2023    Cholesterol, Total 136 01/21/2015    Triglycerides 94 03/05/2023    Triglycerides 75 12/24/2014    HDL 22 (L) 03/05/2023    HDL 44 01/21/2015    Non-HDL Cholesterol 55 (L) 03/05/2023    LDL Calculated 36 (L) 03/05/2023    LDL Direct 64 02/11/2011    LDL Cholesterol, Calculated 62 12/24/2014       Lab Results   Component Value Date    WBC 8.2 11/03/2023  WBC 5.6 03/16/2023    WBC 4.9 01/21/2015    WBC 5.3 12/24/2014    HGB 12.1 11/03/2023    HGB 11.4 01/21/2015    Hgb, blood gas 9.6 (L) 05/22/2013    HCT 37.2 11/03/2023    HCT 36.5 01/21/2015    Platelet 198 11/03/2023    Platelet 224 01/21/2015       Past Medical History:   Diagnosis Date    Coronary artery disease     Essential hypertension 09/28/2003    Granulomatosis with Polyangiitis 12/17/2005    Immunosuppression (CMS-HCC) 07/22/2015       Past Surgical History:   Procedure Laterality Date    CARDIAC SURGERY      stents    HYSTERECTOMY  1998    NEPHRECTOMY TRANSPLANTED ORGAN      OOPHORECTOMY  1998    PR COLONOSCOPY FLX DX W/COLLJ SPEC WHEN PFRMD N/A 12/30/2018    Procedure: COLONOSCOPY, FLEXIBLE, PROXIMAL TO SPLENIC FLEXURE; DIAGNOSTIC, W/WO COLLECTION SPECIMEN BY BRUSH OR WASH;  Surgeon: Leland Her, MD;  Location: [facility];  Service: Gastroenterology    PR EXPLOR POSTOP BLEED,INFEC,CLOT-ABD Right 05/22/2013    Procedure: EXPLOR POSTOP HEMORR THROMBOSIS/INFEC; ABD;  Surgeon: Estanislado Emms, MD;  Location: MAIN OR [place];  Service: Transplant    PR TRANSPLANT,PREP CADAVER RENAL GRAFT N/A 05/17/2013    Procedure: [facility] STD PREP CAD DONR RENAL ALLOGFT PRIOR TO TRNSPLNT, INCL DISSEC/REM PERINEPH FAT, DIAPH/RTPER ATTAC;  Surgeon: Purcell Mouton, MD;  Location: MAIN OR [place];  Service: Transplant    PR TRANSPLANTATION OF KIDNEY Right 05/17/2013    Procedure: RENAL ALLOTRANSPLANTATION, IMPLANTATION OF GRAFT; WITHOUT RECIPIENT NEPHRECTOMY;  Surgeon: Purcell Mouton, MD;  Location: MAIN OR ;  Service: Transplant    SKIN BIOPSY      TRIGGER FINGER RELEASE Right 2016    middle and ring fingers

## 2023-11-22 ENCOUNTER — Ambulatory Visit
Admit: 2023-11-22 | Discharge: 2023-11-23 | Payer: MEDICARE | Attending: Nurse Practitioner | Primary: Nurse Practitioner

## 2023-11-22 DIAGNOSIS — I2510 Atherosclerotic heart disease of native coronary artery without angina pectoris: Principal | ICD-10-CM

## 2023-11-22 DIAGNOSIS — I4819 Other persistent atrial fibrillation: Principal | ICD-10-CM

## 2023-11-24 NOTE — Unmapped (Signed)
Blood pressure at home had several readings and averaged about 110 over 60.    PTHomeBP    Heart rate between 70-100, but usually around 80.    Sophia Davis

## 2023-11-26 NOTE — Unmapped (Addendum)
[facility] Refill Coordination Note    Specialty Medication(s) to be Shipped:   Transplant:  mycophenolic acid 360mg  and tacrolimus 0.5mg     Other medication(s) to be shipped:  losartan     Sophia Davis, DOB: 07-14-1946  Phone: [phone] (home)       All above HIPAA information was verified with patient.     Was a Nurse, learning disability used for this call? No    Completed refill call assessment today to schedule patient's medication shipment from the [facility]  [phone]).  All relevant notes have been reviewed.     Specialty medication(s) and dose(s) confirmed: Regimen is correct and unchanged.   Changes to medications: Sophia Davis reports no changes at this time.  Changes to insurance: No  New side effects reported not previously addressed with a pharmacist or physician: None reported  Questions for the pharmacist: No    Confirmed patient received a Conservation officer, historic buildings and a Surveyor, mining with first shipment. The patient will receive a drug information handout for each medication shipped and additional FDA Medication Guides as required.       DISEASE/MEDICATION-SPECIFIC INFORMATION        N/A    SPECIALTY MEDICATION ADHERENCE     Medication Adherence    Patient reported X missed doses in the last month: 0  Specialty Medication: Mycophenolate 360mg   Patient is on additional specialty medications: Yes  Additional Specialty Medications: Tacrolimus 0.5mg   Patient Reported Additional Medication X Missed Doses in the Last Month: 0  Patient is on more than two specialty medications: No  Adherence tools used: patient uses a pill box to manage medications              Were doses missed due to medication being on hold? No    Mycophenolate 360 mg: 7 days of medicine on hand   Tacrolimus 0.5 mg: 7 days of medicine on hand     REFERRAL TO PHARMACIST     Referral to the pharmacist: Not needed      [facility]     Shipping address confirmed in Epic.       Delivery Scheduled: Yes, Expected medication delivery date: 11/28/22.     Medication will be delivered via Same Day Courier to the prescription address in Epic WAM.    [name], [facility]   [facility]  Specialty Pharmacist

## 2023-11-29 MED FILL — TACROLIMUS 0.5 MG CAPSULE, IMMEDIATE-RELEASE: ORAL | 30 days supply | Qty: 150 | Fill #2

## 2023-11-29 MED FILL — MYCOPHENOLATE SODIUM 360 MG TABLET,DELAYED RELEASE: ORAL | 30 days supply | Qty: 60 | Fill #2

## 2023-12-21 NOTE — Unmapped (Signed)
 Endocrinology New Consult Visit    Assessment/Plan:   # Postmenopausal osteoporosis  # Mild compression fracture of T7 vertebra   Recent DEXA scan with worsening T scores of left femoral neck -3.7, left total hip -3.4 and L spine -2.6. On review of DEXA scan, she has variability of her T scores in Lumbar spine from -1.7 to -3 suggestive of possible compression fracture. Prior rx included oral fosamax weekly for 5 years (discontinued October 2024). X-ray of her T and L-spine reveal mild compression in the thoracic spine.  Given these findings and history of CKD stage III, plan for treatment with Prolia every 6 months.  Patient prefers to have this scheduled either on 4/7 or 4/16 when she is back at [facility] for appointments.    # Thyroid nodule  Ordered ultrasound thyroid to be completed at [facility]      Post-menopausal osteoporosis    Orders:  -     XR Thoracic Spine 2 Views; Future  -     US Thyroid; Future  -     Calcium; Future  -     Parathyroid Hormone (PTH); Future  -     Thyroid Peroxidase Antibody; Future  -     Vitamin D 25 Hydroxy (25OH D2 + D3); Future  -     TSH; Future  -     T4, Free; Future  -     XR Lumbar Spine 2 or 3 Views; Future    Osteoporosis, unspecified osteoporosis type, unspecified pathological fracture presence  -     Ambulatory Referral to Endocrinology    Thyroid nodule  -     US Thyroid; Future  -     TSH; Future  -     T4, Free; Future    Closed wedge compression fracture of T7 vertebra, sequela  -     XR Thoracic Spine 2 Views; Future  -     XR Lumbar Spine 2 or 3 Views; Future    Stage 3b chronic kidney disease (CMS-HCC)    Other orders  -     denosumab  -     sodium chloride 0.9 %  -     sodium chloride 0.9%  -     diphenhydrAMINE HCL  -     famotidine (PF)  -     dexAMETHasone sodium phosphate  -     methylPREDNISolone sodium succinate  -     EPINEPHrine      Burgess Amor, MD  Rheumatology Fellow, PGY5     Patient was seen and discussed with Dr. Lyn Records    Return in about 6 months (around 06/23/2024).  Disclaimer: Portions of this note were dictated by speech recognition.  Minor errors in transcription may be present.       Subjective   History of Present Illness   Sophia Davis is a 78 y.o. female seen in [facility] Endocrinology Consultation , The primary encounter diagnosis was Post-menopausal osteoporosis. Diagnoses of Osteoporosis, unspecified osteoporosis type, unspecified pathological fracture presence, Thyroid nodule, Closed wedge compression fracture of T7 vertebra, sequela, and Stage 3b chronic kidney disease (CMS-HCC) were also pertinent to this visit.,  Pt was seen at the request of [place], Jeoffrey Massed, Irena Cords, Jeoffrey Massed, MD.   No chief complaint on file.    Patient had a recent DEXA scan with a T-score of -3.7 in the left femoral neck and -3.4 in the total hip.  She was previously on oral alendronate 70  mg weekly which was stopped about 6 months ago.  She was on oral alendronate for at least 5 years.  Her PCP took her off of it 6 months ago for drug holiday.  She denies fragility fractures.  She denies lower back pain.  She continues on vitamin D 1000 IU daily.  She does take calcium through her diet.  She had a prior history of GPA and had a kidney transplant in 2014.  She is currently on Myfortic and tacrolimus.  She is no longer on prednisone.    PERSONAL FX HX    FAMILY FX HX   No family history of osteoporosis or fragility fractures    PRIOR OP TX/DATES  Fosamax 70 mg weekly, stopped in October 2024    RF FOR OP  Not currently on any treatment for OP    ACTIVITY/MOBILITY  She occasionally walks around the block during the summer months.  She is not currently walking due to the weather.    DIET/DAIRY INTAKE/CA/D  She tries to have calcium in her diet through vegetables and greens.    GC EXPOSURE  Previously on steroids when diagnosed with GPA.     Past Medical history has ben reviewed , specifically the following    Past Medical History   SHAIRA SOVA has a past medical history of Coronary artery disease, Essential hypertension (09/28/2003), Granulomatosis with Polyangiitis (12/17/2005), and Immunosuppression (CMS-HCC) (07/22/2015).  Past Surgical History   JAHLIYAH TRICE  has a past surgical history that includes Cardiac surgery; pr transplantation of kidney (Right, 05/17/2013); pr transplant,prep cadaver renal graft (N/A, 05/17/2013); pr explor postop bleed,infec,clot-abd (Right, 05/22/2013); Nephrectomy transplanted organ; Trigger finger release (Right, 2016); Hysterectomy (1998); Oophorectomy (1998); pr colonoscopy flx dx w/collj spec when pfrmd (N/A, 12/30/2018); and Skin biopsy.  Family History   SHARELLE BURDITT family history includes Emphysema in her brother; Heart attack in her father; Heart disease in her father and sister; Hypertension in her mother, sister, sister, and son; No Known Problems in her son and son.  Obstetric Status ,  History (if applicable and  file)   Hysterectomy  G3P3000  Social History  Social History     Social History Narrative    Not on file     Medications  I am having [name] maintain her valGANciclovir, atorvastatin, (cholecalciferol (vitamin D3 25 mcg (1,000 units))), apixaban, AEROCHAMBER MV, triamterene-hydroCHLOROthiazide, tacrolimus, traZODone, losartan, mycophenolate, albuterol, amlodipine, and metoPROLOL tartrate.   Allergies  LILIT CINELLI has no known allergies.  Review of Systems   ROS is negative except as noted in HPI.      Objective   Physical Exam  BP 139/64 (BP Position: Sitting)  - Pulse 79  - Wt 49.8 kg (109 lb 12.8 oz)  - BMI 20.75 kg/m??   GENERAL: Well developed, well nourished, alert , and appears to be in no acute distress.  HEAD: normocephalic.  EYES: No evidence of proptosis or chemosis.   THROAT: Oral cavity and pharynx normal. Teeth and gingiva in good general condition.  NECK: Thyroid is non-enlarged, left lobe with small (< 1 cm nodule)  CARDIAC: Normal S1 and S2. No extra sounds or murmurs. Rhythm is regular. There is no peripheral edema, cyanosis or pallor. Extremities are warm and well-perfused.   LUNGS: Clear to auscultation without rales, rhonchi, wheezing or diminished breath sounds.  MUSCULOSKELETAL: No kyphosis. Able to easily ambulate from seated to standing and walking position using both lower and upper body strength. Adequately  aligned spine. ROM grossly intact spine and extremities. No joint erythema or tenderness. Normal muscular development. Normal gait.  BACK: Examination of spine reveals normal gait , posture, no spinal deformity, symmetry of spinal muscles, without tenderness, decreased range of motion or muscular spasm.  EXTREMITIES: No deformity or joint abnormality. No edema. Peripheral pulses intact.   SKIN: Skin normal color, texture and turgor with no lesions or eruptions.  PSYCHIATRIC: The patient was oriented to PPT. The patient was able to demonstrate good judgement and reason, and neuro-typical affect during the examination.  Data  I independently reviewed the lab results, imaging studies including viewing the DXA images, if applicable, with the patient.   Results for orders placed or performed in visit on 12/22/23   T4, Free   Result Value Ref Range    Free T4 1.04 0.89 - 1.76 ng/dL   TSH   Result Value Ref Range    TSH 2.116 0.550 - 4.780 uIU/mL   Calcium   Result Value Ref Range    Calcium 9.9 8.7 - 10.4 mg/dL     Lab Results   Component Value Date    Calcium 9.9 12/22/2023    Calcium 9.0 01/21/2015     No results found for: CALCIUMUR  No results found for: VOLCALUR  No results found for: HCCAURT  Lab Results   Component Value Date    PTH 80.3 (H) 08/18/2023    PTH 83 (H) 11/07/2014     Lab Results   Component Value Date    Vitamin D Total (25OH) 32.2 08/18/2023    Vitamin D Total (25OH) 41 06/10/2016    Vitamin D Total (25OH) 21 11/07/2014     Lab Results   Component Value Date    Creatinine 1.36 (H) 11/03/2023    Creatinine 1.14 01/21/2015     No results found for: SPEP  Lab Results   Component Value Date    TSH 2.116 12/22/2023    TSH 1.89 10/26/2013     No results found for: TESTOSTERONE  No components found for: MAGNESIUM  No components found for: PHOSPHORUS  LAST [facility] DEXA REPORT SPINE/HIP  Results for orders placed during the hospital encounter of 08/05/23    Dexa Bone Density Skeletal    Narrative  EXAM: DEXA BONE DENSITY SKELETAL  DATE: 08/05/2023 8:19 AM  ACCESSION: [id number]  DICTATED: 08/05/2023 8:27 AM  INTERPRETATION LOCATION: [facility]    CLINICAL INDICATION: 78 years old Female with osteoporosis  - M81.0 - Osteoporosis, unspecified osteoporosis type, unspecified pathological fracture presence    COMPARISON: 11/29/2017.    TECHNIQUE: Bone mineral density was assessed using the QDR 4500C bone densitometer.  The results of the study are expressed in bone mineral density (BMD) and interpreted using World Health Organization [facility]) criteria, per ISCD positions.    FINDINGS    Lumbar Spine  Excluded levels: none.  BMD:   0.760 (g/cm2)  T score: -2.6  Lumbar WHO classification: OSTEOPOROSIS.    Left Hip  Femoral Neck:  BMD: 0.437 (g/cm2)  T score: -3.7  Total Hip  BMD: -0.525 (g/cm2)  T score: -3.4  Left Hip WHO classification: OSTEOPOROSIS.      Notes:  T-scores are used when the patient is older than 79 years of age or a female patient is documented as post-menopausal. Normal T-score is -1.0 or greater. Osteoporosis is diagnosed for any T-score lower than -2.5.  If reported, the fracture risk estimate was calculated using FRAX version 3.08. Note, fracture  probability is calculated for an untreated patient and fracture probability may be lower if the patient received or is currently receiving treatment. FRAX may not be reported if all of the T-scores are above -1.0, any T-score is below -2.5 or the related questionnaire was not completed at the time of imaging.  Secondary causes of bone loss should be evaluated if clinically indicated since the etiology of low BMD cannot be determined by BMD measurement alone.  All treatment decisions require clinical judgement and consideration of individual patient factors, including patient preferences, comorbidities, previous drug use and risk factors not captured in the FRAX model (e.g. frailty, falls, vit. D deficiency, increased bone turnover, interval significant decline in BMD, etc).    IMPRESSION    1.  WHO classification is OSTEOPOROSIS.  2.  Comparison with any relevant prior DEXA studies can be seen by using the link:  Show images for Dexa Bone Density Skeletal  --  ]  LAST [facility] DEXA REPORT FOREARM  No results found for this or any previous visit.    LAST [place] NECK IMAGING ([place], [place], [place]) if available   No results found for this or any previous visit.  ]  No results found for this or any previous visit.    No results found for this or any previous visit.    No results found for this or any previous visit.    No results found for this or any previous visit.    No results found for this or any previous visit.    No results found for this or any previous visit.    No results found for this or any previous visit.    No results found for this or any previous visit.    No results found for this or any previous visit.    No results found for this or any previous visit.    No results found for this or any previous visit.    LAST [place] SPINE X RAYS (if available)   No results found for this or any previous visit.    No results found for this or any previous visit.    No results found for this or any previous visit.    No results found for this or any previous visit.    No results found for this or any previous visit.    No results found for this or any previous visit.    No results found for this or any previous visit.    No results found for this or any previous visit.    No results found for this or any previous visit.    No results found for this or any previous visit.    LAST [place] SPINE MRI (if available)    LUMBAR  No results found for this or any previous visit.  ]  No results found for this or any previous visit.  ]  No results found for this or any previous visit.  ]  THORACO-LUMBAR  No results found for this or any previous visit.    CERVICAL  No results found for this or any previous visit.    No results found for this or any previous visit.    WHOLE SPINE MRI  No results found for this or any previous visit.    No results found for this or any previous visit.    LAST  BRAIN MRI  No results found for this or any previous visit.    No  results found for this or any previous visit.    No results found for this or any previous visit.

## 2023-12-22 ENCOUNTER — Inpatient Hospital Stay: Admit: 2023-12-22 | Discharge: 2023-12-22 | Payer: MEDICARE

## 2023-12-22 ENCOUNTER — Ambulatory Visit: Admit: 2023-12-22 | Discharge: 2023-12-22 | Payer: MEDICARE

## 2023-12-22 ENCOUNTER — Ambulatory Visit: Admit: 2023-12-22 | Discharge: 2023-12-22 | Payer: MEDICARE | Attending: "Endocrinology | Primary: "Endocrinology

## 2023-12-22 DIAGNOSIS — S22060S Wedge compression fracture of T7-T8 vertebra, sequela: Principal | ICD-10-CM

## 2023-12-22 DIAGNOSIS — M810 Age-related osteoporosis without current pathological fracture: Principal | ICD-10-CM

## 2023-12-22 DIAGNOSIS — N1832 Stage 3b chronic kidney disease (CMS-HCC): Principal | ICD-10-CM

## 2023-12-22 DIAGNOSIS — E041 Nontoxic single thyroid nodule: Principal | ICD-10-CM

## 2023-12-22 LAB — PARATHYROID HORMONE (PTH): PARATHYROID HORMONE INTACT: 68.5 pg/mL (ref 18.5–88.1)

## 2023-12-22 LAB — THYROID PEROXIDASE ANTIBODY: THYROID PEROXIDASE ANTIBODIES: <28 U/mL (ref <=60)

## 2023-12-22 LAB — TSH: THYROID STIMULATING HORMONE: 2.116 u[IU]/mL (ref 0.550–4.780)

## 2023-12-22 LAB — T4, FREE: FREE T4: 1.04 ng/dL (ref 0.89–1.76)

## 2023-12-22 LAB — CALCIUM: CALCIUM: 9.9 mg/dL (ref 8.7–10.4)

## 2023-12-22 NOTE — Unmapped (Signed)
 There is mild compression in the thoracic spine (T7).  Based on this suggest to begin treatment with Prolia (the twice yearly injection).  Our clinic front desk will work to set this up on your preferred dates.  Someone will call you to schedule this.  Kind regards, MS

## 2023-12-23 NOTE — Unmapped (Signed)
 [facility] 12/10/2023 safety and health consideration   SENT TO HIM

## 2023-12-24 LAB — VITAMIN D 25 HYDROXY: VITAMIN D, TOTAL (25OH): 30.5 ng/mL (ref 20.0–80.0)

## 2023-12-24 NOTE — Unmapped (Signed)
I saw and evaluated the patient with the resident, participating in the key portions of the service.  I reviewed the resident???s note.  I agree with the resident???s findings and plan.   Charlestine Massed, MD

## 2023-12-24 NOTE — Unmapped (Signed)
.  [facility] Refill Coordination Note    Specialty Medication(s) to be Shipped:   Transplant: tacrolimus 5mg  and Mycophenolate 360 mg    Other medication(s) to be shipped:  losartan and trazodone     Sophia Davis, DOB: [birth year]  Phone: [phone] (home)       All above HIPAA information was verified with patient.     Was a Nurse, learning disability used for this call? No    Completed refill call assessment today to schedule patient's medication shipment from the [facility]  [phone]).  All relevant notes have been reviewed.     Specialty medication(s) and dose(s) confirmed: Regimen is correct and unchanged.   Changes to medications: [name] reports no changes at this time.  Changes to insurance: No  New side effects reported not previously addressed with a pharmacist or physician: None reported  Questions for the pharmacist: No    Confirmed patient received a Conservation officer, historic buildings and a Surveyor, mining with first shipment. The patient will receive a drug information handout for each medication shipped and additional FDA Medication Guides as required.       DISEASE/MEDICATION-SPECIFIC INFORMATION        N/A    SPECIALTY MEDICATION ADHERENCE     Medication Adherence    Patient reported X missed doses in the last month: 0  Specialty Medication: mycophenolate 360 MG  Patient is on additional specialty medications: Yes  Additional Specialty Medications: tacrolimus 0.5 MG  Patient Reported Additional Medication X Missed Doses in the Last Month: 0  Patient is on more than two specialty medications: No  Adherence tools used: patient uses a pill box to manage medications              Were doses missed due to medication being on hold? No    mycophenolate 360 mg: 10 days of medicine on hand   tacrolimus 0.5 mg: 10 days of medicine on hand        REFERRAL TO PHARMACIST     Referral to the pharmacist: Not needed      [facility]     Shipping address confirmed in Epic.       Delivery Scheduled: Yes, Expected medication delivery date: 12/30/23.     Medication will be delivered via Next Day Courier to the prescription address in Epic WAM.    Unk Lightning   [facility]  Specialty Technician

## 2023-12-29 ENCOUNTER — Inpatient Hospital Stay: Admit: 2023-12-29 | Discharge: 2023-12-30 | Payer: MEDICARE

## 2023-12-29 MED FILL — TRAZODONE 50 MG TABLET: ORAL | 90 days supply | Qty: 90 | Fill #1

## 2023-12-29 MED FILL — MYCOPHENOLATE SODIUM 360 MG TABLET,DELAYED RELEASE: ORAL | 30 days supply | Qty: 60 | Fill #3

## 2023-12-29 MED FILL — LOSARTAN 50 MG TABLET: ORAL | 30 days supply | Qty: 60 | Fill #2

## 2023-12-29 MED FILL — TACROLIMUS 0.5 MG CAPSULE, IMMEDIATE-RELEASE: ORAL | 30 days supply | Qty: 150 | Fill #3

## 2024-01-21 DIAGNOSIS — Q892 Congenital malformations of other endocrine glands: Principal | ICD-10-CM

## 2024-01-21 DIAGNOSIS — E041 Nontoxic single thyroid nodule: Principal | ICD-10-CM

## 2024-01-21 DIAGNOSIS — R590 Localized enlarged lymph nodes: Principal | ICD-10-CM

## 2024-01-21 NOTE — Unmapped (Signed)
 This cyst is most likely benign but do recommend to repeat the ultrasound in 2-3 months and if it persists, consider CT scan and/or referral to ENT .  I will place orders for next ultrasound. Please call radiology to schedule. Kind regards, MS

## 2024-01-21 NOTE — Unmapped (Signed)
 Called to discuss recent US and recommendation to repeat US in 2-3 months.  I was unable to reach the pt.  I will send mychart message with instructions.  If this cyst changes, would proceed with neck CT and/or ENT referral.   Charlestine Massed, MD  Associate Professor  [facility]  Phone [phone]  Fax [phone]    In the case of a medical emergency call 911  To reach our on-call doctor, dial [phone] and ask to page the adult endocrinologist on call

## 2024-01-23 NOTE — Unmapped (Unsigned)
 DIVISION OF CARDIOLOGY  [facility] [place]        Date of Service: 01/24/2024    Electrophysiology New Patient Clinic Note    PCP: Referring Provider:   Edmonia James, MD  [address]  [place] [address]  Phone: [phone]  Fax: [phone] Meredith Leeds, MD  [address]  [address] [place],  [place] [address]  Phone: [phone]  Fax: [phone]       Assessment and Plan:     Sophia Davis is a 78 y.o. woman with history of CAD, HTN, osteoporosis, stage 3b CKD, granulomatosis with polyangiitis, malignant neoplasm of cervix status post hysterectomy, kidney transplant with immunosuppression who is referred to EP clinic for assessment of persistent atrial fibrillation    Persistent atrial fibrillation (CHA2DS2-Vasc: 4) : unclear duration of atrial fibrillation. No ECGs in our system between 2018 (sinus) and 07/2023 (Afib). Patient states heart rate is normal at home when checking at home. Taking metoprolol 25mg  BID and eliquis 2.5mg  BId. No sense of fluttering in chest, no chest pain, no dizziness, no light-headedness. No easy bleeding. ECG today with atrial fibrillation.  Echo 2014 showed normal LV ejection fraction 60-65%, mitral annular calcification, mild pulmonary hypertension, trivial-small pericardial effusion.  ECG 08/05/2023 showed atrial fibrillation with rapid ventricular response.  Subsequent ECG 08/18/2023 demonstrated rate controlled atrial fibrillation.    Plan:  Monitor Afib  Reduce salt intake to reduce ankle swelling  Continue on Eliquis to reduce stroke risk.  See PCP every 6 months for blood work due to being on blood thinners.  Consider Watchmen procedure in the future if Eliquis causes issues.  See patient if Afib symptoms worsen.    Follow-up:  See patient as needed if symptoms worsen.    Entered by Freddy Jaksch, Scribe, on behalf of Dr. Kathi Ludwig, on 01/24/24, 9:28 AM.      Documentation assistance was provided by Freddy Jaksch (scribe). I was present during the time the encounter was recorded. The information recorded by the scribe was done at my direction and has been reviewed and validated by me. The documentation recorded by the scribe accurately reflects the service I personally performed and the decisions made by me      Daiva Huge, BSc [facility]), MBChB, MRCP, [facility]  Associate Professor of Medicine  Clinical Cardiac Electrophysiology  [facility] [place] [facility] [place] [place]      01/24/24; 9:28 AM     Subjective:     History of Present Illness:     Sophia Davis is a 78 y.o. woman with history of CAD, HTN, osteoporosis, stage 3b CKD, granulomatosis with polyangiitis, malignant neoplasm of cervix status post hysterectomy, kidney transplant with immunosuppression who is referred to EP clinic for assessment of persistent atrial fibrillation    As noted above,  unclear duration of atrial fibrillation. No ECGs in our system between 2018 (sinus) and 07/2023 (Afib). Patient states heart rate is normal at home when checking at home. Taking metoprolol 25mg  BID and eliquis 2.5mg  BId. No sense of fluttering in chest, no chest pain, no dizziness, no light-headedness. No easy bleeding. ECG today with atrial fibrillation.  Echo 2014 showed normal LV ejection fraction 60-65%, mitral annular calcification, mild pulmonary hypertension, trivial-small pericardial effusion.  ECG 08/05/2023 showed atrial fibrillation with rapid ventricular response.  Subsequent ECG 08/18/2023 demonstrated rate controlled atrial fibrillation.    Heart rate is staying low at home when checking  at home. Taking metoprolol 25mg  BID and eliquis 2.5mg  BIG. No sense of fluttering in chest, no chest pain, no dizziness, no light-headedness. No easy bleeding.     Blood pressure  at home reportedly ranges from 120s-140s/50s-70s.    Patient is doing well today. Reports some swelling in ankles which has been off and on. Says it could be salt because her diet is high in salt. Patient says she will try and cut down on salt. Patient's cardioversion December 17th, 2024 did not work as she had an immediate return of AF after 30 seconds of being in rhythm.     Discuss that we could try a medication or an AF ablation to get patient in rhythm. However, we recommend that because patient is feeling well right now, it may not be worth it to try an intervention that may not help prognosis significantly.     Due to Chads-2vasc patient is at elevated risk of stroke so it is important that patient stays on Eliquis. Patient has kidney transplant medications which can also make rhythm control difficult.  We discuss the Watchmen procedure as an option in the future if she has issues with Eliquis.     Medical History:  Past Medical History:   Diagnosis Date    Coronary artery disease     Essential hypertension 09/28/2003    Granulomatosis with Polyangiitis 12/17/2005    Immunosuppression 07/22/2015       Surgical History:  Past Surgical History:   Procedure Laterality Date    CARDIAC SURGERY      stents    HYSTERECTOMY  1998    NEPHRECTOMY TRANSPLANTED ORGAN      OOPHORECTOMY  1998    PR COLONOSCOPY FLX DX W/COLLJ SPEC WHEN PFRMD N/A 12/30/2018    Procedure: COLONOSCOPY, FLEXIBLE, PROXIMAL TO SPLENIC FLEXURE; DIAGNOSTIC, W/WO COLLECTION SPECIMEN BY BRUSH OR WASH;  Surgeon: Leland Her, MD;  Location[facility];  Service: Gastroenterology    PR EXPLOR POSTOP BLEED,INFEC,CLOT-ABD Right 05/22/2013    Procedure: EXPLOR POSTOP HEMORR THROMBOSIS/INFEC; ABD;  Surgeon: Estanislado Emms, MD;  Location: MAIN OR [place];  Service: Transplant    PR TRANSPLANT,PREP CADAVER RENAL GRAFT N/A 05/17/2013    Procedure: [facility] STD PREP CAD DONR RENAL ALLOGFT PRIOR TO TRNSPLNT, INCL DISSEC/REM PERINEPH FAT, DIAPH/RTPER ATTAC;  Surgeon: Purcell Mouton, MD;  Location: MAIN OR [place];  Service: Transplant    PR TRANSPLANTATION OF KIDNEY Right 05/17/2013    Procedure: RENAL ALLOTRANSPLANTATION, IMPLANTATION OF GRAFT; WITHOUT RECIPIENT NEPHRECTOMY;  Surgeon: Purcell Mouton, MD;  Location: MAIN OR [place];  Service: Transplant    SKIN BIOPSY      TRIGGER FINGER RELEASE Right 2016    middle and ring fingers       Social History:   reports that she has never smoked. She has never used smokeless tobacco. She reports that she does not currently use alcohol. She reports that she does not use drugs.    Family History:  family history includes Emphysema in her brother; Heart attack in her father; Heart disease in her father and sister; Hypertension in her mother, sister, sister, and son; No Known Problems in her son and son.    Review of Systems:   Except as noted in the HPI, the remainder of 10 systems reviewed is negative.     Allergies:  No Known Allergies    Medications:   Prior to Admission medications    Medication Dose, Route, Frequency  albuterol HFA 90 mcg/actuation inhaler INHALE 2 PUFFS BY MOUTH EVERY 6 HOURS AS NEEDED FOR WHEEZE  Patient not taking: Reported on 12/22/2023   amlodipine (NORVASC) 5 MG tablet 5 mg, Oral, Nightly   apixaban (ELIQUIS) 2.5 mg Tab 2.5 mg, Oral, 2 times a day (standard)   atorvastatin (LIPITOR) 40 MG tablet Take 1 tablet by mouth every evening   cholecalciferol, vitamin D3 25 mcg, 1,000 units,, 1,000 unit (25 mcg) tablet 25 mcg, Oral, Daily (standard)   inhalational spacing device (AEROCHAMBER MV) Spcr 1 each, Miscellaneous, Daily   losartan (COZAAR) 50 MG tablet 50 mg, Oral, 2 times a day (standard)   metoPROLOL tartrate (LOPRESSOR) 25 MG tablet 25 mg, Oral, 2 times a day (standard)   mycophenolate (MYFORTIC) 360 MG TbEC 360 mg, Oral, 2 times a day (standard)   tacrolimus (PROGRAF) 0.5 MG capsule Take 3 capsules (1.5 mg total) by mouth in the morning AND 2 capsules (1 mg total) in the evening.   traZODone (DESYREL) 50 MG tablet 50 mg, Oral, Nightly   triamterene-hydroCHLOROthiazide (MAXZIDE-25) 37.5-25 mg per tablet 0.5 tablets, Oral, Daily (standard)   valGANciclovir (VALCYTE) 450 mg tablet 900 mg, Oral, Daily (standard)        Objective:     Vitals  There were no vitals taken for this visit.     Wt Readings from Last 3 Encounters:   12/22/23 49.8 kg (109 lb 12.8 oz)   11/22/23 47.6 kg (105 lb)   11/03/23 47.2 kg (104 lb)       Physical Exam  General:  Pleasant woman sitting in chair in nad.   Neck: Supple, JVP normal.   Resp:   CTAB bilaterally with normal WOB.   Cardio:  Irregularly irregular, no significant M/R/G   Abdomen:   Soft, non-distended, non-tender.   Extremities: Warm well-perfused bilaterally. No edema .   MSK: No joint swelling or erythema. No gross deformities.   Skin: No rashes   Neuro: CN II-XII grossly intact. Strength grossly intact.    Psych: Alert and oriented x3. Appropriate mood.      ECG (01/23/24)  Atrial fibrillation    Most Recent Labs   Lab Results   Component Value Date    NA 139 11/03/2023    K 3.6 11/03/2023    CL 102 11/03/2023    CO2 24.1 11/03/2023     Lab Results   Component Value Date    BUN 21 11/03/2023    BUN 24 (H) 08/18/2023    BUN 16 12/28/2016    BUN 18 08/31/2016     Lab Results   Component Value Date    Creatinine 1.36 (H) 11/03/2023    Creatinine 1.32 (H) 08/18/2023    Creatinine 1.14 01/21/2015    Creatinine 1.14 (H) 11/07/2014     No results found for: PROBNP  Lab Results   Component Value Date    Cholesterol 77 03/05/2023    Cholesterol, Total 136 01/21/2015    Triglycerides 94 03/05/2023    Triglycerides 75 12/24/2014    HDL 22 (L) 03/05/2023    HDL 44 01/21/2015    Non-HDL Cholesterol 55 (L) 03/05/2023    LDL Calculated 36 (L) 03/05/2023    LDL Direct 64 02/11/2011    LDL Cholesterol, Calculated 62 12/24/2014       Cardiac Investigations    ECG 12 Lead (personal review)  Afib with non-specific st and T wave changes QTc 477.     NM Myocardial Perfusion Spect Multiple (  10/22/23)  -No evidence of scar. However, there is a  medium in size, mild intensity inducible defect of the lateral wall. Differential diagnosis includes ischemia; however, artifact have similar appearance.  - Left ventricular systolic function is normal. Post stress the ejection fraction is > 60%.  -Heavy coronary calcifications are noted.  -Additional incidental CT findings as below.    Echocardiogram W Colorflow Spectral Doppler (11/03/23)   1. The left ventricle is normal in size with upper normal wall thickness.    2. The left ventricular systolic function is normal, LVEF is visually  estimated at > 55%.    3. The left atrium is mildly to moderately dilated in size.    4. The right ventricle is normal in size, with normal systolic function.    5. The mitral valve leaflets are mildly thickened with normal leaflet  mobility.    6. There is mild mitral valve regurgitation.    7. The aortic valve is poorly visualized with probably normal excursion with  mildly thickened leaflets with normal excursion.    8. There is moderate aortic regurgitation.    9. TR maximum velocity: 3.0 m/s  Estimated PASP: 38 mmHg.    10. There is a trivial pericardial effusion.    11. No comparison study.    EP Cardioversions (10/05/23)  [name] presents today for DC cardioversion. She was consented and prepared in the usual manner. The patient was appropriately anticoagulated for at least 3 weeks prior to DCCV The patient was sedated by the anesthesia staff with deep sedation. Once She was adequately sedated, a single biphasic 200J shock was delivered through anterior-posterior patches. Atrial fibrillation terminated, however she had frequent atrial ectopy and went back into Afib within 15 seconds. We delivered another biphasic 200J shock, with intermittent sinus beats and PACs with return of Afib again within 20-30 seconds.

## 2024-01-24 ENCOUNTER — Ambulatory Visit: Admit: 2024-01-24 | Discharge: 2024-01-25

## 2024-01-24 DIAGNOSIS — I4819 Other persistent atrial fibrillation: Principal | ICD-10-CM

## 2024-01-24 NOTE — Unmapped (Signed)
 Referring Provider: Irvin Bastin Fiazuddin, MD  [address]  [address] [place],  [place] [address]  Phone: [phone]  Fax: [phone]     Primary Provider: Georgia Kipper, MD  [address]  [place] [address]  Phone: [phone]  Fax: [phone]     Other Providers:      Date of consultation: 01/24/2024      Follow-up Visit     Assessment and Plan:      Sophia Davis is a 78 y.o. female with incidentally discovered rate controlled persistent atrial fibrillation. She underwent DCCV on 10/05/23 to see if she would feel better in sinus rhythm but developed immediate return of AF (IRAF) after 30 seconds.    Assessment & Plan  Atrial Fibrillation  Chronic atrial fibrillation with immediate recurrence post-cardioversion in December 2024. Asymptomatic and incidentally discovered in October 2024. CHADS2VASc score of 4, indicating elevated stroke risk due to age, sex, and coronary disease. No significant symptoms such as heart failure or tachycardia. Risks of rhythm control medications and ablation outweigh potential benefits due to interaction with transplant medications and procedural invasiveness. Limited evidence that rhythm control will improve prognosis or symptoms in this case.  - Continue Eliquis 2.5 mg twice daily for stroke prevention.  - Monitor blood tests every 6 months to 1 year with primary care physician to assess anemia and renal function.  - Consider Watchman procedure if significant bleeding or anemia occurs due to anticoagulation, but not currently recommended.  - No further cardiology follow-up unless AFib causes complications or anticoagulation issues arise.    Kidney Transplant  Kidney transplant due to granulomatosis with polyangiitis. Potential interactions between transplant medications and rhythm control drugs for AFib.  - Continue current transplant medication regimen.    Peripheral Edema  Intermittent ankle edema, possibly related to high sodium intake. No acute distress reported.  - Advise reduction in sodium intake to manage edema.    Follow-up:  Return if symptoms worsen or fail to improve.      All questions were answered for the patient.  Thank you very much for the referral.  Please contact me with any questions at telephone number [phone].        Emory Harps, MD, BSc (Hons), MBChB, MRCP, [facility], [place]  Associate Professor of Medicine  Clinical Cardiac Electrophysiology  [facility] [place] [facility] [place] [facility] [place]     01/24/2024      History of Presenting Illness:     History of Present Illness  The patient is a 78 year old with atrial fibrillation who presents for a follow-up visit.    She has a history of atrial fibrillation, first identified in October of the previous year. She underwent a cardioversion on December 17th, which resulted in an immediate return of atrial fibrillation within 30 seconds. She is currently asymptomatic from the atrial fibrillation, which was incidentally discovered. She remains in atrial fibrillation as confirmed by recent evaluations.    Her CHADS2 VASc score is 4, indicating an elevated risk of stroke due to her age, gender, and coronary disease. She is on Eliquis 2.5 mg twice daily for stroke prevention. She has a history of a kidney transplant and granulomatosis with polyangiitis, which complicates the use of certain rhythm control medications due to potential interactions with her transplant medications.    She feels generally well, except for experiencing some swelling in her ankles. She attributes this swelling to her high  salt intake. She is active and reports no limitations in her daily activities, such as cleaning her house and working in her garden. She will be turning 78 years old on April 24th.      Past Medical and Surgical History:     Past Medical History:   Diagnosis Date    Coronary artery disease     Essential hypertension 09/28/2003    Granulomatosis with Polyangiitis 12/17/2005 Immunosuppression 07/22/2015       Past Surgical History:   Procedure Laterality Date    CARDIAC SURGERY      stents    HYSTERECTOMY  1998    NEPHRECTOMY TRANSPLANTED ORGAN      OOPHORECTOMY  1998    PR COLONOSCOPY FLX DX W/COLLJ SPEC WHEN PFRMD N/A 12/30/2018    Procedure: COLONOSCOPY, FLEXIBLE, PROXIMAL TO SPLENIC FLEXURE; DIAGNOSTIC, W/WO COLLECTION SPECIMEN BY BRUSH OR WASH;  Surgeon: Dorthy Gavia, MD;  Location: [facility];  Service: Gastroenterology    PR EXPLOR POSTOP BLEED,INFEC,CLOT-ABD Right 05/22/2013    Procedure: EXPLOR POSTOP HEMORR THROMBOSIS/INFEC; ABD;  Surgeon: Arnell Bevels, MD;  Location: MAIN OR [place];  Service: Transplant    PR TRANSPLANT,PREP CADAVER RENAL GRAFT N/A 05/17/2013    Procedure: BACKBNCH STD PREP CAD DONR RENAL ALLOGFT PRIOR TO TRNSPLNT, INCL DISSEC/REM PERINEPH FAT, DIAPH/RTPER ATTAC;  Surgeon: Rudolfo Cosier, MD;  Location: MAIN OR [place];  Service: Transplant    PR TRANSPLANTATION OF KIDNEY Right 05/17/2013    Procedure: RENAL ALLOTRANSPLANTATION, IMPLANTATION OF GRAFT; WITHOUT RECIPIENT NEPHRECTOMY;  Surgeon: Rudolfo Cosier, MD;  Location: MAIN OR [place];  Service: Transplant    SKIN BIOPSY      TRIGGER FINGER RELEASE Right 2016    middle and ring fingers       Medications and Allergies:     Current Outpatient Medications   Medication Sig Dispense Refill    albuterol HFA 90 mcg/actuation inhaler INHALE 2 PUFFS BY MOUTH EVERY 6 HOURS AS NEEDED FOR WHEEZE (Patient taking differently: Inhale every four (4) hours as needed.) 8.5 g 6    amlodipine (NORVASC) 5 MG tablet Take 1 tablet (5 mg total) by mouth nightly. 90 tablet 3    apixaban (ELIQUIS) 2.5 mg Tab Take 1 tablet (2.5 mg total) by mouth two (2) times a day. 60 tablet 6    atorvastatin (LIPITOR) 40 MG tablet Take 1 tablet by mouth every evening 90 tablet 2    cholecalciferol, vitamin D3 25 mcg, 1,000 units,, 1,000 unit (25 mcg) tablet Take 1 tablet (25 mcg total) by mouth daily. 100 tablet 3    losartan (COZAAR) 50 MG tablet Take 1 tablet (50 mg total) by mouth two (2) times a day. 60 tablet 11    metoPROLOL tartrate (LOPRESSOR) 25 MG tablet Take 1 tablet (25 mg total) by mouth two (2) times a day. 180 tablet 3    mycophenolate (MYFORTIC) 360 MG TbEC Take 1 tablet (360 mg total) by mouth two (2) times a day. 60 tablet 11    tacrolimus (PROGRAF) 0.5 MG capsule Take 3 capsules (1.5 mg total) by mouth in the morning AND 2 capsules (1 mg total) in the evening. 150 capsule 11    traZODone (DESYREL) 50 MG tablet Take 1 tablet (50 mg total) by mouth nightly. 90 tablet 3    triamterene-hydroCHLOROthiazide (MAXZIDE-25) 37.5-25 mg per tablet TAKE 1/2 TABLET BY MOUTH DAILY 45 tablet 1    valGANciclovir (VALCYTE) 450 mg tablet Take 2 tablets (900 mg  total) by mouth daily. 180 tablet 3    inhalational spacing device (AEROCHAMBER MV) Spcr 1 each by Miscellaneous route in the morning. (Patient not taking: Reported on 01/24/2024) 1 each 0     No current facility-administered medications for this visit.       Allergies  Patient has no known allergies.    Family History:     The patient's family history includes Emphysema in her brother; Heart attack in her father; Heart disease in her father and sister; Hypertension in her mother, sister, sister, and son; No Known Problems in her son and son.    Social History:     Social History     Socioeconomic History    Marital status: Widowed   Tobacco Use    Smoking status: Never    Smokeless tobacco: Never   Substance and Sexual Activity    Alcohol use: Not Currently    Drug use: No    Sexual activity: Not Currently   Other Topics Concern    Do you use sunscreen? Yes    Tanning bed use? No    Are you easily burned? No    Excessive sun exposure? No    Blistering sunburns? No     Social Drivers of Psychologist, prison and probation services Strain: Low Risk  (12/02/2022)    Overall Financial Resource Strain (CARDIA)     Difficulty of Paying Living Expenses: Not hard at all   Food Insecurity: No Food Insecurity (12/02/2022)    Hunger Vital Sign     Worried About Running Out of Food in the Last Year: Never true     Ran Out of Food in the Last Year: Never true   Transportation Needs: No Transportation Needs (12/02/2022)    PRAPARE - Therapist, art (Medical): No     Lack of Transportation (Non-Medical): No       Review of Systems:     All 10 systems reviewed and negative unless otherwise states in the HPI      Physical Examination:     Vitals:    01/24/24 0824   BP: 132/55   Pulse: 83   SpO2: 98%     Height: 154.9 cm (5' 1)  Wt Readings from Last 3 Encounters:   01/24/24 50 kg (110 lb 3.2 oz)   12/22/23 49.8 kg (109 lb 12.8 oz)   11/22/23 47.6 kg (105 lb)        Body mass index is 20.82 kg/m??.  Physical Exam        Investigations:     Lab Review   Lab Results   Component Value Date    WBC 8.2 11/03/2023    HGB 12.1 11/03/2023    HCT 37.2 11/03/2023    MCV 84.6 11/03/2023    PLT 198 11/03/2023     Lab Results   Component Value Date    GLUCOSE 149 05/22/2013    BUN 21 11/03/2023    CO2 24.1 11/03/2023    CREATININE 1.36 (H) 11/03/2023    K 3.6 11/03/2023    NA 139 11/03/2023    CL 102 11/03/2023    CALCIUM 9.9 12/22/2023       Lab Results   Component Value Date    TROPONINI <0.034 11/24/2016       Lab Results   Component Value Date    MG 1.5 (L) 08/18/2023       Lab Results   Component Value  Date    PHOS 4.6 08/18/2023     Lab Results   Component Value Date    PT 11.3 07/27/2012    INR 1.1 05/16/2013     No results found for: BNP  Lab Results   Component Value Date    ALT <7 (L) 11/03/2023    AST 13 11/03/2023    GGT 13 02/24/2016    ALKPHOS 70 11/03/2023    BILITOT 0.7 11/03/2023     Lab Results   Component Value Date    TRIG 94 03/05/2023    HDL 22 (L) 03/05/2023    LDL 36 (L) 03/05/2023       Lab Results   Component Value Date    TSH 2.116 12/22/2023       Cardiac Investigations:  Results  DIAGNOSTIC  EKG: Atrial fibrillation (07/2023)  EKG: Normal (10/2020)  EKG: Normal (11/2016) 12 Lead ECG (independently reviewed): AF, 77/minute, nonspecific ST and T wave changes, QTc 

## 2024-02-02 ENCOUNTER — Ambulatory Visit: Admit: 2024-02-02 | Discharge: 2024-02-03 | Payer: MEDICARE

## 2024-02-02 DIAGNOSIS — M810 Age-related osteoporosis without current pathological fracture: Principal | ICD-10-CM

## 2024-02-02 DIAGNOSIS — N1832 Stage 3b chronic kidney disease (CMS-HCC): Principal | ICD-10-CM

## 2024-02-02 DIAGNOSIS — S22060S Wedge compression fracture of T7-T8 vertebra, sequela: Principal | ICD-10-CM

## 2024-02-02 MED ORDER — VALGANCICLOVIR 450 MG TABLET
ORAL_TABLET | Freq: Every day | ORAL | 3 refills | 90.00 days
Start: 2024-02-02 — End: 2025-02-01

## 2024-02-02 MED ORDER — TRIAMTERENE 37.5 MG-HYDROCHLOROTHIAZIDE 25 MG TABLET
ORAL_TABLET | Freq: Every day | ORAL | 1 refills | 90.00 days
Start: 2024-02-02 — End: ?

## 2024-02-02 MED ORDER — APIXABAN 2.5 MG TABLET
ORAL_TABLET | Freq: Two times a day (BID) | ORAL | 6 refills | 30.00 days
Start: 2024-02-02 — End: ?

## 2024-02-02 MED ADMIN — denosumab (PROLIA) injection 60 mg: 60 mg | SUBCUTANEOUS | @ 13:00:00 | Stop: 2024-02-02

## 2024-02-02 NOTE — Unmapped (Signed)
 [facility]     Reason for visit: Follow up    Questions / Concerns that need to be addressed:     Screening BP- 120/49      PTHomeBP           Dexcom or Libre CGM in use? If so, pull appropriate reporting through portal (Dexcom) or EPIC order Jerrilyn Moras).    HCDM reviewed and updated in Epic:    We are working to make sure all of our patients??? wishes are updated in Epic and part of that is documenting a Environmental health practitioner for each patient  A Health Care Decision Maker is someone you choose who can make health care decisions for you if you are not able - who would you most want to do this for you????  is already up to date.    HCDM (other legal document): Carmin Chow - [phone]    [facility] (patient stated preference): Newt Barefoot - Daughter - [phone]    BPAs completed:  PHQ2    Annual Screenings:   SDOH , Tobacco, Audit/Alcohol , Substance Use, Domestic Abuse, and Health Literacy  __________________________________________________________________________________________    SCREENINGS COMPLETED IN FLOWSHEETS      AUDIT  AUDIT - C Score (Part 1): 0    PHQ2  PHQ-2 Total Score : 0    PHQ9          GAD7       COPD Assessment       Falls Risk

## 2024-02-02 NOTE — Unmapped (Addendum)
 [facility] Refill Coordination Note    Specialty Medication(s) to be Shipped:   Transplant: mycophenolate mofetil 360mg  and tacrolimus 0.5mg     Other medication(s) to be shipped:  losartan, atorvastatin, vit d3, eliquis     Sophia Davis, DOB: 10-16-1946  Phone: [phone] (home)       All above HIPAA information was verified with patient.     Was a Nurse, learning disability used for this call? No    Completed refill call assessment today to schedule patient's medication shipment from the [facility]  [phone]).  All relevant notes have been reviewed.     Specialty medication(s) and dose(s) confirmed: Regimen is correct and unchanged.   Changes to medications: Laniqua reports no changes at this time.  Changes to insurance: No  New side effects reported not previously addressed with a pharmacist or physician: None reported  Questions for the pharmacist: No    Confirmed patient received a Conservation officer, historic buildings and a Surveyor, mining with first shipment. The patient will receive a drug information handout for each medication shipped and additional FDA Medication Guides as required.       DISEASE/MEDICATION-SPECIFIC INFORMATION        N/A    SPECIALTY MEDICATION ADHERENCE     Medication Adherence    Specialty Medication: tacrolimus 0.5 MG capsule (PROGRAF)  Patient is on additional specialty medications: Yes  Additional Specialty Medications: mycophenolate 360 MG Tbec (MYFORTIC)  Patient is on more than two specialty medications: No  Adherence tools used: patient uses a pill box to manage medications              Were doses missed due to medication being on hold? No    mycophenolate 360 MG Tbec (MYFORTIC): 5 days of medicine on hand   tacrolimus 0.5 MG capsule (PROGRAF): 5 days of medicine on hand     REFERRAL TO PHARMACIST     Referral to the pharmacist: Not needed      [facility]     Shipping address confirmed in Epic.     Cost and Payment: Patient has a copay of $6.19. They are aware and have authorized the pharmacy to charge the credit card on file.    Delivery Scheduled: Yes, Expected medication delivery date: 02/07/24.     Medication will be delivered via Same Day Courier to the prescription address in Epic WAM.    Shiri Hodapp   [place] [facility]  Specialty Technician

## 2024-02-02 NOTE — Unmapped (Signed)
 Nurse visit today for Prolia injection. 2 identifiers and allergies verified.   Labs checked. Possible side effects discussed.    Prolia 60mg  given  in right upper arm. See MAR for medication administration details.     Next appointment scheduled with patient for 6 months from now. Prolia contract explained and signed by patient. Copy of contract given to patient.

## 2024-02-04 MED ORDER — VALGANCICLOVIR 450 MG TABLET
ORAL_TABLET | Freq: Every day | ORAL | 3 refills | 90.00 days | Status: CP
Start: 2024-02-04 — End: 2025-02-03
  Filled 2024-02-11: qty 180, 90d supply, fill #0

## 2024-02-04 MED ORDER — TRIAMTERENE 37.5 MG-HYDROCHLOROTHIAZIDE 25 MG TABLET
ORAL_TABLET | Freq: Every day | ORAL | 1 refills | 90.00 days | Status: CP
Start: 2024-02-04 — End: ?
  Filled 2024-02-11: qty 45, 90d supply, fill #0

## 2024-02-04 MED ORDER — APIXABAN 2.5 MG TABLET
ORAL_TABLET | Freq: Two times a day (BID) | ORAL | 6 refills | 30.00 days | Status: CP
Start: 2024-02-04 — End: ?
  Filled 2024-02-07: qty 60, 30d supply, fill #0

## 2024-02-07 MED FILL — LOSARTAN 50 MG TABLET: ORAL | 30 days supply | Qty: 60 | Fill #3

## 2024-02-07 MED FILL — TACROLIMUS 0.5 MG CAPSULE, IMMEDIATE-RELEASE: ORAL | 30 days supply | Qty: 150 | Fill #4

## 2024-02-07 MED FILL — MYCOPHENOLATE SODIUM 360 MG TABLET,DELAYED RELEASE: ORAL | 30 days supply | Qty: 60 | Fill #4

## 2024-02-07 MED FILL — CHOLECALCIFEROL (VITAMIN D3) 25 MCG (1,000 UNIT) TABLET: ORAL | 100 days supply | Qty: 100 | Fill #2

## 2024-02-07 MED FILL — ATORVASTATIN 40 MG TABLET: ORAL | 90 days supply | Qty: 90 | Fill #2

## 2024-02-09 NOTE — Unmapped (Signed)
 [facility] Specialty Medication Onboarding    Specialty Medication: Valganciclovir  450mg  tablet  Prior Authorization: Not Required   Financial Assistance: No - copay  <$25  Final Copay/Day Supply: $1.60 / 90 days    Insurance Restrictions: None     Notes to Pharmacist: N/A  Credit Card on File: yes  Start Date on Rx:  N/A    The triage team has completed the benefits investigation and has determined that the patient is able to fill this medication at [facility]. Please contact the patient to complete the onboarding or follow up with the prescribing physician as needed.

## 2024-02-09 NOTE — Unmapped (Signed)
 The following medication is onboarded in this note:  Valganciclovir  $1.60/90ds not part b    [facility], [facility]    Patient Onboarding/Medication Counseling    Sophia Davis is a 78 y.o. female with kidney transplant who I am counseling today on  continuation ([facility])  of therapy.  I am speaking to the patient.    Was a Nurse, learning disability used for this call? No    Verified patient's date of birth / HIPAA.    Specialty medication(s) to be sent: n/a- see delivery info on clinical note      Non-specialty medications/supplies to be sent:   N/a      Medications not needed at this time: n/a         The patient declined counseling on missed dose instructions, goals of therapy, side effects and monitoring parameters, warnings and precautions, drug/food interactions, and storage, handling precautions, and disposal because they have taken the medication previously. The information in the declined sections below are for informational purposes only and was not discussed with patient.   Valcyte  (valganciclovir )    Medication & Administration     Dosage:   Take 2 tablets (900mg  total) by mouth daily    Administration:   Take with food  Swallow the pills whole, do not break, crush, or chew    Adherence/Missed dose instructions:  Take a missed dose as soon as you think about it with food  If it is close to your next dose, skip the missed dose and go back to your normal time.  Do not take 2 doses at the same time or extra doses.  Report any missed doses to coordinator    Goals of Therapy     To prevent or treat CMV infection in setting of solid organ transplant    Side Effects & Monitoring Parameters   Common side effects  Headache  Diarrhea or constipation  Appetite or sleep disturbances  Back, muscle, joint, or belly pain  Weight loss  Dizziness  Muscle spasm  Upset stomach or vomiting    The following side effects should be reported to the provider:  Allergic reaction  (rash, hives, swelling, blistered or peeling skin, shortness of breath)  Infection (fever, chills, sore throat, ear/sinus pain, cough, sputum change, urinary pain, mouth sores, non-healing wounds)  Bleeding (cough ground vomit, blood in urine, black/red/tarry stools, unexplained bruising or bleeding)  Electrolyte problems (mood changes, confusion, weakness, abnormal heartbeat, seizures)  Kidney problems (urine changes, weight gain)  Yellowing skin or eyes  Swelling in arms, legs, stomach  Severe dizziness or passing out  Eye issues (eyesight changes, pain, or irritation)  Night sweats    Monitoring parameters  Have eye exam as directed by doctor  CMV counts  CBC  Renal function  Pregnancy test prior to initiation    Contraindications, Warnings, & Precautions   BBW: severe leukopenia, neutropenia, anemia, thrombocytopenia, pancytopenia, and bone marrow failure, including aplastic anemia have been reported  BBW: may cause temporary or permanent inhibition of spermatogenesis and suppression of fertilty; has the potential to cause birth defects and cancers in humans  Female patients should have pregnancy test prior to initiation and use birth control for at least 30 days after discontinuation  Female patients should use a barrier contraceptive while on therapy and for 90 days after discontinuation  Acute renal failure  Not indicated for use in liver transplant recipients  Breastfeeding is not recommended    Drug/Food Interactions   Medication  list reviewed in Epic. The patient was instructed to inform the care team before taking any new medications or supplements.  No interactions noted that clinic is not already monitoring .   Check with your doctor before getting any vaccinations (live or inactivated)    Storage, Handling Precautions, & Disposal   Store at room temperature  Keep away from children and pets      Current Medications (including OTC/herbals), Comorbidities and Allergies     Current Outpatient Medications   Medication Sig Dispense Refill    albuterol  HFA 90 mcg/actuation inhaler INHALE 2 PUFFS BY MOUTH EVERY 6 HOURS AS NEEDED FOR WHEEZE (Patient taking differently: Inhale every four (4) hours as needed.) 8.5 g 6    amlodipine  (NORVASC ) 5 MG tablet Take 1 tablet (5 mg total) by mouth nightly. 90 tablet 3    apixaban  (ELIQUIS ) 2.5 mg Tab Take 1 tablet (2.5 mg total) by mouth two (2) times a day. 60 tablet 6    apixaban  (ELIQUIS ) 2.5 mg Tab Take 1 tablet (2.5 mg total) by mouth two (2) times a day. 60 tablet 0    atorvastatin  (LIPITOR) 40 MG tablet Take 1 tablet by mouth every evening 90 tablet 2    cholecalciferol , vitamin D3 25 mcg, 1,000 units,, 1,000 unit (25 mcg) tablet Take 1 tablet (25 mcg total) by mouth daily. 100 tablet 3    inhalational spacing device (AEROCHAMBER MV) Spcr 1 each by Miscellaneous route in the morning. (Patient not taking: Reported on 02/02/2024) 1 each 0    losartan  (COZAAR ) 50 MG tablet Take 1 tablet (50 mg total) by mouth two (2) times a day. 60 tablet 11    metoPROLOL  tartrate (LOPRESSOR ) 25 MG tablet Take 1 tablet (25 mg total) by mouth two (2) times a day. 180 tablet 3    mycophenolate  (MYFORTIC ) 360 MG TbEC Take 1 tablet (360 mg total) by mouth two (2) times a day. 60 tablet 11    tacrolimus  (PROGRAF ) 0.5 MG capsule Take 3 capsules (1.5 mg total) by mouth in the morning AND 2 capsules (1 mg total) in the evening. 150 capsule 11    traZODone  (DESYREL ) 50 MG tablet Take 1 tablet (50 mg total) by mouth nightly. 90 tablet 3    triamterene -hydroCHLOROthiazide  (MAXZIDE -25) 37.5-25 mg per tablet Take 0.5 tablets by mouth daily. 45 tablet 1    valGANciclovir  (VALCYTE ) 450 mg tablet Take 2 tablets (900 mg total) by mouth daily. 180 tablet 3     No current facility-administered medications for this visit.       No Known Allergies    Patient Active Problem List   Diagnosis    Stage 3b chronic kidney disease    Granulomatosis with Polyangiitis    Herpes zoster    Hypercholesterolemia    Essential hypertension (RAF-HCC) Coronary artery disease    Status post total abdominal hysterectomy    Post-menopausal osteoporosis    Stented coronary artery    Secondary hyperparathyroidism    Kidney transplant 05/17/2013    Immunosuppression    Cervical cancer    Seasonal allergies    COVID-19    Primary insomnia    Weight loss    Persistent atrial fibrillation    Closed wedge compression fracture of T7 vertebra       Medication list has been reviewed and updated in Epic: Yes    Allergies have been reviewed and updated in Epic: Yes    Appropriateness of Therapy     Acute infections  noted within Epic:  No active infections  Patient reported infection: None    Is the medication and dose appropriate based on diagnosis, medication list, comorbidities, allergies, medical history, patient???s ability to self-administer the medication, and therapeutic goals? Yes    Prescription has been clinically reviewed: Yes      Baseline Quality of Life Assessment      How many days over the past month did your transplant  keep you from your normal activities? For example, brushing your teeth or getting up in the morning. 0    Financial Information     Medication Assistance provided: None Required    Anticipated copay of $1.60/90ds reviewed with patient. Verified delivery address.    Delivery Information     Scheduled delivery date: n/a- see delivery info on clinical note    Expected start date: [facility]      Medication will be delivered via n/a to the  n/a  address in [place].  This shipment will not require a signature.      Explained the services we provide at [facility] and that each month we would call to set up refills.  Stressed importance of returning phone calls so that we could ensure they receive their medications in time each month.  Informed patient that we should be setting up refills 7-10 days prior to when they will run out of medication.  A pharmacist will reach out to perform a clinical assessment periodically.  Informed patient that a welcome packet, containing information about our pharmacy and other support services, a Notice of Privacy Practices, and a drug information handout will be sent.      The patient or caregiver noted above participated in the development of this care plan and knows that they can request review of or adjustments to the care plan at any time.      Patient or caregiver verbalized understanding of the above information as well as how to contact the pharmacy at [phone] option 4 with any questions/concerns.  The pharmacy is open Monday through Friday 8:30am-4:30pm.  A pharmacist is available 24/7 via pager to answer any clinical questions they may have.    Patient Specific Needs     Does the patient have any physical, cognitive, or cultural barriers? No    Does the patient have adequate living arrangements? (i.e. the ability to store and take their medication appropriately) Yes    Did you identify any home environmental safety or security hazards? No    Patient prefers to have medications discussed with  Patient     Is the patient or caregiver able to read and understand education materials at a high school level or above? Yes    Patient's primary language is  English     Is the patient high risk? Yes, patient is taking a REMS drug. Medication is dispensed in compliance with REMS program    Does the patient have an additional or emergency contact listed in their chart?  yes    SOCIAL DETERMINANTS OF HEALTH     At the [facility], we have learned that life circumstances - like trouble affording food, housing, utilities, or transportation can affect the health of many of our patients.   That is why we wanted to ask: are you currently experiencing any life circumstances that are negatively impacting your health and/or quality of life? Patient declined to answer    Social Drivers of Health  Food Insecurity: No Food Insecurity (02/02/2024)    Hunger Vital Sign     Worried About Running Out of Food in the Last Year: Never true     Ran Out of Food in the Last Year: Never true   Tobacco Use: Low Risk  (02/02/2024)    Patient History     Smoking Tobacco Use: Never     Smokeless Tobacco Use: Never     Passive Exposure: Not on file   Transportation Needs: Unmet Transportation Needs (02/02/2024)    PRAPARE - Therapist, art (Medical): Yes     Lack of Transportation (Non-Medical): Yes   Alcohol Use: Not At Risk (02/02/2024)    Alcohol Use     How often do you have a drink containing alcohol?: Never     How many drinks containing alcohol do you have on a typical day when you are drinking?: 1 - 2     How often do you have 5 or more drinks on one occasion?: Never   Housing: Low Risk  (02/02/2024)    Housing     Within the past 12 months, have you ever stayed: outside, in a car, in a tent, in an overnight shelter, or temporarily in someone else's home (i.e. couch-surfing)?: No     Are you worried about losing your housing?: No   Physical Activity: Not on file   Utilities: Low Risk  (02/02/2024)    Utilities     Within the past 12 months, have you been unable to get utilities (heat, electricity) when it was really needed?: No   Stress: Not on file   Interpersonal Safety: Not At Risk (01/24/2024)    Interpersonal Safety     Unsafe Where You Currently Live: No     Physically Hurt by Anyone: No     Abused by Anyone: No   Substance Use: Low Risk  (02/02/2024)    Substance Use     In the past year, how often have you used prescription drugs for non-medical reasons?: Never     In the past year, how often have you used illegal drugs?: Never     In the past year, have you used any substance for non-medical reasons?: No   Intimate Partner Violence: Not At Risk (02/02/2024)    Humiliation, Afraid, Rape, and Kick questionnaire     Fear of Current or Ex-Partner: No     Emotionally Abused: No     Physically Abused: No     Sexually Abused: No   Social Connections: Not on file Financial Resource Strain: Low Risk  (02/02/2024)    Overall Financial Resource Strain (CARDIA)     Difficulty of Paying Living Expenses: Not hard at all   Depression: Not at risk (02/02/2024)    PHQ-2     PHQ-2 Score: 0   Internet Connectivity: Not on file   Health Literacy: Low Risk  (02/02/2024)    Health Literacy     : Never       Would you be willing to receive help with any of the needs that you have identified today? Not applicable       Christine Cozier, PharmD  [facility] Specialty Pharmacist

## 2024-02-10 NOTE — Unmapped (Signed)
 [facility] Delivery Pharmacy Clinical Assessment & Refill Coordination Note    Sophia Davis, DOB: [birth year]  Phone: [phone] (home)     All above HIPAA information was verified with patient.     Was a Nurse, learning disability used for this call? No    Specialty Medication(s):   Transplant: mycophenolic acid 360mg , tacrolimus  0.5mg , and valgancyclovir 450mg      Current Outpatient Medications   Medication Sig Dispense Refill    albuterol  HFA 90 mcg/actuation inhaler INHALE 2 PUFFS BY MOUTH EVERY 6 HOURS AS NEEDED FOR WHEEZE (Patient taking differently: Inhale every four (4) hours as needed.) 8.5 g 6    amlodipine  (NORVASC ) 5 MG tablet Take 1 tablet (5 mg total) by mouth nightly. 90 tablet 3    apixaban  (ELIQUIS ) 2.5 mg Tab Take 1 tablet (2.5 mg total) by mouth two (2) times a day. 60 tablet 6    apixaban  (ELIQUIS ) 2.5 mg Tab Take 1 tablet (2.5 mg total) by mouth two (2) times a day. 60 tablet 0    atorvastatin  (LIPITOR) 40 MG tablet Take 1 tablet by mouth every evening 90 tablet 2    cholecalciferol , vitamin D3 25 mcg, 1,000 units,, 1,000 unit (25 mcg) tablet Take 1 tablet (25 mcg total) by mouth daily. 100 tablet 3    inhalational spacing device (AEROCHAMBER MV) Spcr 1 each by Miscellaneous route in the morning. (Patient not taking: Reported on 02/02/2024) 1 each 0    losartan  (COZAAR ) 50 MG tablet Take 1 tablet (50 mg total) by mouth two (2) times a day. 60 tablet 11    metoPROLOL  tartrate (LOPRESSOR ) 25 MG tablet Take 1 tablet (25 mg total) by mouth two (2) times a day. 180 tablet 3    mycophenolate  (MYFORTIC ) 360 MG TbEC Take 1 tablet (360 mg total) by mouth two (2) times a day. 60 tablet 11    tacrolimus  (PROGRAF ) 0.5 MG capsule Take 3 capsules (1.5 mg total) by mouth in the morning AND 2 capsules (1 mg total) in the evening. 150 capsule 11    traZODone  (DESYREL ) 50 MG tablet Take 1 tablet (50 mg total) by mouth nightly. 90 tablet 3    triamterene -hydroCHLOROthiazide  (MAXZIDE -25) 37.5-25 mg per tablet Take 0.5 tablets by mouth daily. 45 tablet 1    valGANciclovir  (VALCYTE ) 450 mg tablet Take 2 tablets (900 mg total) by mouth daily. 180 tablet 3     No current facility-administered medications for this visit.        Changes to medications: Raylinn reports no changes at this time.    Medication list has been reviewed and updated in Epic: Yes    No Known Allergies    Changes to allergies: No    Allergies have been reviewed and updated in Epic: Yes    SPECIALTY MEDICATION ADHERENCE     Mycophenolate  360mg   : 30 days of medicine on hand   Tacrolimus  0.5mg   : 30 days of medicine on hand   Valganciclovir  450mg   : 5 days of medicine on hand       Medication Adherence    Patient reported X missed doses in the last month: 0  Specialty Medication: mycophenolate  360mg   Patient is on additional specialty medications: Yes  Additional Specialty Medications: Tacrolimus  0.5mg   Patient Reported Additional Medication X Missed Doses in the Last Month: 0  Patient is on more than two specialty medications: Yes  Specialty Medication: valganciclovir  450mg   Patient Reported Additional Medication X Missed Doses in the Last  Month: 0  Adherence tools used: patient uses a pill box to manage medications          Specialty medication(s) dose(s) confirmed: Regimen is correct and unchanged.     Are there any concerns with adherence? No    Adherence counseling provided? Not needed    CLINICAL MANAGEMENT AND INTERVENTION      Clinical Benefit Assessment:    Do you feel the medicine is effective or helping your condition? Yes    Clinical Benefit counseling provided? Not needed    Adverse Effects Assessment:    Are you experiencing any side effects? No    Are you experiencing difficulty administering your medicine? No    Quality of Life Assessment:    Quality of Life    Rheumatology  Oncology  Dermatology  Cystic Fibrosis          How many days over the past month did your transplant  keep you from your normal activities? For example, brushing your teeth or getting up in the morning. 0    Have you discussed this with your provider? Not needed    Acute Infection Status:    Acute infections noted within Epic:  No active infections    Patient reported infection: None    Therapy Appropriateness:    Is therapy appropriate based on current medication list, adverse reactions, adherence, clinical benefit and progress toward achieving therapeutic goals? Yes, therapy is appropriate and should be continued     Clinical Intervention:    Was an intervention completed as part of this clinical assessment? No    DISEASE/MEDICATION-SPECIFIC INFORMATION      N/A    Solid Organ Transplant: Not Applicable    PATIENT SPECIFIC NEEDS     Does the patient have any physical, cognitive, or cultural barriers? No    Is the patient high risk? Yes, patient is taking a REMS drug. Medication is dispensed in compliance with REMS program    Does the patient require physician intervention or other additional services (i.e., nutrition, smoking cessation, social work)? No    Does the patient have an additional or emergency contact listed in their chart? Yes    SOCIAL DETERMINANTS OF HEALTH     At the [facility], we have learned that life circumstances - like trouble affording food, housing, utilities, or transportation can affect the health of many of our patients.   That is why we wanted to ask: are you currently experiencing any life circumstances that are negatively impacting your health and/or quality of life? Patient declined to answer    Social Drivers of Health     Food Insecurity: No Food Insecurity (02/02/2024)    Hunger Vital Sign     Worried About Running Out of Food in the Last Year: Never true     Ran Out of Food in the Last Year: Never true   Tobacco Use: Low Risk  (02/02/2024)    Patient History     Smoking Tobacco Use: Never     Smokeless Tobacco Use: Never     Passive Exposure: Not on file   Transportation Needs: Unmet Transportation Needs (02/02/2024)    PRAPARE - Therapist, art (Medical): Yes     Lack of Transportation (Non-Medical): Yes   Alcohol Use: Not At Risk (02/02/2024)    Alcohol Use     How often do you have a drink containing alcohol?: Never     How many drinks containing alcohol  do you have on a typical day when you are drinking?: 1 - 2     How often do you have 5 or more drinks on one occasion?: Never   Housing: Low Risk  (02/02/2024)    Housing     Within the past 12 months, have you ever stayed: outside, in a car, in a tent, in an overnight shelter, or temporarily in someone else's home (i.e. couch-surfing)?: No     Are you worried about losing your housing?: No   Physical Activity: Not on file   Utilities: Low Risk  (02/02/2024)    Utilities     Within the past 12 months, have you been unable to get utilities (heat, electricity) when it was really needed?: No   Stress: Not on file   Interpersonal Safety: Not At Risk (01/24/2024)    Interpersonal Safety     Unsafe Where You Currently Live: No     Physically Hurt by Anyone: No     Abused by Anyone: No   Substance Use: Low Risk  (02/02/2024)    Substance Use     In the past year, how often have you used prescription drugs for non-medical reasons?: Never     In the past year, how often have you used illegal drugs?: Never     In the past year, have you used any substance for non-medical reasons?: No   Intimate Partner Violence: Not At Risk (02/02/2024)    Humiliation, Afraid, Rape, and Kick questionnaire     Fear of Current or Ex-Partner: No     Emotionally Abused: No     Physically Abused: No     Sexually Abused: No   Social Connections: Not on file   Financial Resource Strain: Low Risk  (02/02/2024)    Overall Financial Resource Strain (CARDIA)     Difficulty of Paying Living Expenses: Not hard at all   Depression: Not at risk (02/02/2024)    PHQ-2     PHQ-2 Score: 0   Internet Connectivity: Not on file   Health Literacy: Low Risk  (02/02/2024)    Health Literacy     : Never       Would you be willing to receive help with any of the needs that you have identified today? Not applicable       SHIPPING     Specialty Medication(s) to be Shipped:   Transplant: valgancyclovir 450mg     Other medication(s) to be shipped:  triamterene -hydrochlorothiazide   Patient declined ALL other refills at this time     Changes to insurance: No    Cost and Payment: Patient has a copay of $1.60/90ds each for valgan and triam/hctz. They are aware and have authorized the pharmacy to charge the credit card on file.    Delivery Scheduled: Yes, Expected medication delivery date: 02/11/2024.     Medication will be delivered via Same Day Courier to the confirmed prescription address in [facility].    The patient will receive a drug information handout for each medication shipped and additional FDA Medication Guides as required.  Verified that patient has previously received a Conservation officer, historic buildings and a Surveyor, mining.    The patient or caregiver noted above participated in the development of this care plan and knows that they can request review of or adjustments to the care plan at any time.      All of the patient's questions and concerns have been addressed.    Christine Cozier, PharmD  [facility] Specialty Pharmacist

## 2024-02-15 NOTE — Unmapped (Signed)
 4/29: patient has questions for clinic on why she needs to continue on valcyte , how long she will be on it, and what followup bloodwork she needs. Advised patient to contact clinic to discuss, I will also message provider and transplant clinic team -[facility] Delivery [facility] Clinical Assessment & Refill Coordination Note    Sophia Davis, DOB: 07/17/1946  Phone: [phone] (home)     All above HIPAA information was verified with patient.     Was a Nurse, learning disability used for this call? No    Specialty Medication(s):   Transplant: mycophenolic acid 360mg , tacrolimus  0.5mg , and valgancyclovir 450mg      Current Outpatient Medications   Medication Sig Dispense Refill    albuterol  HFA 90 mcg/actuation inhaler INHALE 2 PUFFS BY MOUTH EVERY 6 HOURS AS NEEDED FOR WHEEZE (Patient taking differently: Inhale every four (4) hours as needed.) 8.5 g 6    amlodipine  (NORVASC ) 5 MG tablet Take 1 tablet (5 mg total) by mouth nightly. 90 tablet 3    apixaban  (ELIQUIS ) 2.5 mg Tab Take 1 tablet (2.5 mg total) by mouth two (2) times a day. 60 tablet 6    apixaban  (ELIQUIS ) 2.5 mg Tab Take 1 tablet (2.5 mg total) by mouth two (2) times a day. 60 tablet 0    atorvastatin  (LIPITOR) 40 MG tablet Take 1 tablet by mouth every evening 90 tablet 2    cholecalciferol , vitamin D3 25 mcg, 1,000 units,, 1,000 unit (25 mcg) tablet Take 1 tablet (25 mcg total) by mouth daily. 100 tablet 3    inhalational spacing device (AEROCHAMBER MV) Spcr 1 each by Miscellaneous route in the morning. (Patient not taking: Reported on 02/02/2024) 1 each 0    losartan  (COZAAR ) 50 MG tablet Take 1 tablet (50 mg total) by mouth two (2) times a day. 60 tablet 11    metoPROLOL  tartrate (LOPRESSOR ) 25 MG tablet Take 1 tablet (25 mg total) by mouth two (2) times a day. 180 tablet 3    mycophenolate  (MYFORTIC ) 360 MG TbEC Take 1 tablet (360 mg total) by mouth two (2) times a day. 60 tablet 11    tacrolimus  (PROGRAF ) 0.5 MG capsule Take 3 capsules (1.5 mg total) by mouth in the morning AND 2 capsules (1 mg total) in the evening. 150 capsule 11    traZODone  (DESYREL ) 50 MG tablet Take 1 tablet (50 mg total) by mouth nightly. 90 tablet 3    triamterene -hydroCHLOROthiazide  (MAXZIDE -25) 37.5-25 mg per tablet Take 1/2 tablet by mouth daily. 45 tablet 1    valGANciclovir  (VALCYTE ) 450 mg tablet Take 2 tablets (900 mg total) by mouth daily. 180 tablet 3     No current facility-administered medications for this visit.        Changes to medications: Greenlee reports no changes at this time.    Medication list has been reviewed and updated in Epic: Yes    No Known Allergies    Changes to allergies: No    Allergies have been reviewed and updated in Epic: Yes    SPECIALTY MEDICATION ADHERENCE     Tacrolimus  0.5mg   : 25 days of medicine on hand   Mycophenolate  360mg   : 25 days of medicine on hand   Valganciclovir  450mg   : 88 days of medicine on hand       Medication Adherence    Patient reported X missed doses in the last month: 0  Specialty Medication: valganciclovir  450mg   Patient is on additional specialty  medications: Yes  Additional Specialty Medications: Tacrolimus  0.5mg   Patient Reported Additional Medication X Missed Doses in the Last Month: 0  Patient is on more than two specialty medications: Yes  Specialty Medication: mycophenolate  360mg   Patient Reported Additional Medication X Missed Doses in the Last Month: 0  Adherence tools used: patient uses a pill box to manage medications          Specialty medication(s) dose(s) confirmed: Regimen is correct and unchanged.     Are there any concerns with adherence? No    Adherence counseling provided? Not needed    CLINICAL MANAGEMENT AND INTERVENTION      Clinical Benefit Assessment:    Do you feel the medicine is effective or helping your condition? Yes    Clinical Benefit counseling provided? Not needed    Adverse Effects Assessment:    Are you experiencing any side effects? No    Are you experiencing difficulty administering your medicine? No    Quality of Life Assessment:    Quality of Life    Rheumatology  Oncology  Dermatology  Cystic Fibrosis          How many days over the past month did your transplant  keep you from your normal activities? For example, brushing your teeth or getting up in the morning. 0    Have you discussed this with your provider? Not needed    Acute Infection Status:    Acute infections noted within Epic:  No active infections    Patient reported infection: None    Therapy Appropriateness:    Is therapy appropriate based on current medication list, adverse reactions, adherence, clinical benefit and progress toward achieving therapeutic goals? Yes, therapy is appropriate and should be continued     Clinical Intervention:    Was an intervention completed as part of this clinical assessment? No    DISEASE/MEDICATION-SPECIFIC INFORMATION      N/A    Solid Organ Transplant: Not Applicable    PATIENT SPECIFIC NEEDS     Does the patient have any physical, cognitive, or cultural barriers? No    Is the patient high risk? Yes, patient is taking a REMS drug. Medication is dispensed in compliance with REMS program    Does the patient require physician intervention or other additional services (i.e., nutrition, smoking cessation, social work)? No    Does the patient have an additional or emergency contact listed in their chart? Yes    SOCIAL DETERMINANTS OF HEALTH     At the [facility], we have learned that life circumstances - like trouble affording food, housing, utilities, or transportation can affect the health of many of our patients.   That is why we wanted to ask: are you currently experiencing any life circumstances that are negatively impacting your health and/or quality of life? Patient declined to answer    Social Drivers of Health     Food Insecurity: No Food Insecurity (02/02/2024)    Hunger Vital Sign     Worried About Running Out of Food in the Last Year: Never true     Ran Out of Food in the Last Year: Never true   Tobacco Use: Low Risk  (02/02/2024)    Patient History     Smoking Tobacco Use: Never     Smokeless Tobacco Use: Never     Passive Exposure: Not on file   Transportation Needs: Unmet Transportation Needs (02/02/2024)    PRAPARE - Transportation     Lack of Transportation (  Medical): Yes     Lack of Transportation (Non-Medical): Yes   Alcohol Use: Not At Risk (02/02/2024)    Alcohol Use     How often do you have a drink containing alcohol?: Never     How many drinks containing alcohol do you have on a typical day when you are drinking?: 1 - 2     How often do you have 5 or more drinks on one occasion?: Never   Housing: Low Risk  (02/02/2024)    Housing     Within the past 12 months, have you ever stayed: outside, in a car, in a tent, in an overnight shelter, or temporarily in someone else's home (i.e. couch-surfing)?: No     Are you worried about losing your housing?: No   Physical Activity: Not on file   Utilities: Low Risk  (02/02/2024)    Utilities     Within the past 12 months, have you been unable to get utilities (heat, electricity) when it was really needed?: No   Stress: Not on file   Interpersonal Safety: Not At Risk (01/24/2024)    Interpersonal Safety     Unsafe Where You Currently Live: No     Physically Hurt by Anyone: No     Abused by Anyone: No   Substance Use: Low Risk  (02/02/2024)    Substance Use     In the past year, how often have you used prescription drugs for non-medical reasons?: Never     In the past year, how often have you used illegal drugs?: Never     In the past year, have you used any substance for non-medical reasons?: No   Intimate Partner Violence: Not At Risk (02/02/2024)    Humiliation, Afraid, Rape, and Kick questionnaire     Fear of Current or Ex-Partner: No     Emotionally Abused: No     Physically Abused: No     Sexually Abused: No   Social Connections: Not on file   Financial Resource Strain: Low Risk  (02/02/2024)    Overall Financial Resource Strain (CARDIA) Difficulty of Paying Living Expenses: Not hard at all   Health Literacy: Low Risk  (02/02/2024)    Health Literacy     : Never   Internet Connectivity: Not on file       Would you be willing to receive help with any of the needs that you have identified today? Not applicable       SHIPPING     Specialty Medication(s) to be Shipped:   N/a    Other medication(s) to be shipped: No additional medications requested for fill at this time     Changes to insurance: No    Cost and Payment:  n/a    Delivery Scheduled: Patient declined refill at this time due to supply on hand of all medications, patient aware next scheduled outreach is set up for 2 weeks out and to contact [facility] if any needs sooner.     Medication will be delivered via  n/a  to the confirmed  n/a  address in [facility].    The patient will receive a drug information handout for each medication shipped and additional FDA Medication Guides as required.  Verified that patient has previously received a Conservation officer, historic buildings and a Surveyor, mining.    The patient or caregiver noted above participated in the development of this care plan and knows that they can request review of or adjustments to the  care plan at any time.      All of the patient's questions and concerns have been addressed.    Christine Cozier, PharmD   [facility] Home Delivery Pharm[facility] Specialty Pharmacist

## 2024-02-16 NOTE — Unmapped (Signed)
 Received message from [facility] that pt had some questions about why she needed valcyte .   Phyliss Breen, she reports that she had not taken valcyte  for months and then all the sudden was being sent the medication.  Refill was sent in by PCP. Unsure if pt needs valcyte  at this time. Pt was asked to please get labs this week including CMV level and [facility] will let her know what to do.   New lab orders sent in to [place].  Will review with Dr. Abel Abelson

## 2024-02-17 ENCOUNTER — Other Ambulatory Visit
Admission: RE | Admit: 2024-02-17 | Discharge: 2024-02-17 | Disposition: A | Discharge status: Home / Self Care | Source: Ambulatory Visit | Attending: Nephrology | Admitting: Nephrology

## 2024-02-17 DIAGNOSIS — D631 Anemia in chronic kidney disease: Secondary | ICD-10-CM | POA: Insufficient documentation

## 2024-02-17 DIAGNOSIS — N189 Chronic kidney disease, unspecified: Secondary | ICD-10-CM | POA: Diagnosis not present

## 2024-02-17 DIAGNOSIS — Z79899 Other long term (current) drug therapy: Secondary | ICD-10-CM | POA: Diagnosis present

## 2024-02-17 DIAGNOSIS — Z789 Other specified health status: Secondary | ICD-10-CM | POA: Diagnosis not present

## 2024-02-17 DIAGNOSIS — Z09 Encounter for follow-up examination after completed treatment for conditions other than malignant neoplasm: Secondary | ICD-10-CM | POA: Insufficient documentation

## 2024-02-17 DIAGNOSIS — Z9483 Pancreas transplant status: Secondary | ICD-10-CM | POA: Insufficient documentation

## 2024-02-17 DIAGNOSIS — Z1389 Encounter for screening for other disorder: Secondary | ICD-10-CM | POA: Diagnosis not present

## 2024-02-17 DIAGNOSIS — Z940 Kidney transplant status: Secondary | ICD-10-CM | POA: Diagnosis present

## 2024-02-17 DIAGNOSIS — E1122 Type 2 diabetes mellitus with diabetic chronic kidney disease: Secondary | ICD-10-CM | POA: Insufficient documentation

## 2024-02-17 DIAGNOSIS — E559 Vitamin D deficiency, unspecified: Secondary | ICD-10-CM | POA: Insufficient documentation

## 2024-02-17 DIAGNOSIS — D899 Disorder involving the immune mechanism, unspecified: Secondary | ICD-10-CM | POA: Insufficient documentation

## 2024-02-17 DIAGNOSIS — Z114 Encounter for screening for human immunodeficiency virus [HIV]: Secondary | ICD-10-CM | POA: Diagnosis not present

## 2024-02-17 LAB — CBC WITH DIFFERENTIAL/PLATELET
Abs Immature Granulocytes: 0.02 10*3/uL (ref 0.00–0.07)
Basophils Absolute: 0 10*3/uL (ref 0.0–0.1)
Basophils Relative: 1 %
Eosinophils Absolute: 0.1 10*3/uL (ref 0.0–0.5)
Eosinophils Relative: 2 %
HCT: 36.6 % (ref 36.0–46.0)
Hemoglobin: 11.5 g/dL — ABNORMAL LOW (ref 12.0–15.0)
Immature Granulocytes: 0 %
Lymphocytes Relative: 33 %
Lymphs Abs: 1.5 10*3/uL (ref 0.7–4.0)
MCH: 27.7 pg (ref 26.0–34.0)
MCHC: 31.4 g/dL (ref 30.0–36.0)
MCV: 88.2 fL (ref 80.0–100.0)
Monocytes Absolute: 0.5 10*3/uL (ref 0.1–1.0)
Monocytes Relative: 10 %
Neutro Abs: 2.5 10*3/uL (ref 1.7–7.7)
Neutrophils Relative %: 54 %
Platelets: 210 10*3/uL (ref 150–400)
RBC: 4.15 MIL/uL (ref 3.87–5.11)
RDW: 13.5 % (ref 11.5–15.5)
WBC: 4.7 10*3/uL (ref 4.0–10.5)
nRBC: 0 % (ref 0.0–0.2)

## 2024-02-17 LAB — BASIC METABOLIC PANEL WITH GFR
Anion gap: 7 (ref 5–15)
BUN: 19 mg/dL (ref 8–23)
CO2: 26 mmol/L (ref 22–32)
Calcium: 9 mg/dL (ref 8.9–10.3)
Chloride: 106 mmol/L (ref 98–111)
Creatinine, Ser: 1.42 mg/dL — ABNORMAL HIGH (ref 0.44–1.00)
GFR, Estimated: 38 mL/min — ABNORMAL LOW (ref >60)
Glucose, Bld: 98 mg/dL (ref 70–99)
Potassium: 3.3 mmol/L — ABNORMAL LOW (ref 3.5–5.1)
Sodium: 139 mmol/L (ref 135–145)

## 2024-02-17 LAB — PHOSPHORUS: Phosphorus: 4.2 mg/dL (ref 2.5–4.6)

## 2024-02-17 LAB — MAGNESIUM: Magnesium: 1.4 mg/dL — ABNORMAL LOW (ref 1.7–2.4)

## 2024-02-17 LAB — BASIC METABOLIC PANEL
ANION GAP: 7 (ref 5–15)
BLOOD UREA NITROGEN: 19 mg/dL (ref 8–23)
CALCIUM: 9 mg/dL (ref 8.9–10.3)
CHLORIDE: 106 mmol/L (ref 98–111)
CO2: 26 mmol/L (ref 22–32)
CREATININE: 1.42 mg/dL — ABNORMAL HIGH (ref 0.44–1.00)
EGFR CKD-EPI AA MALE: 38 mL/min — ABNORMAL LOW
GLUCOSE RANDOM: 98 mg/dL (ref 70–99)
MAGNESIUM: 1.4 mg/dL — ABNORMAL LOW (ref 1.7–2.4)
PHOSPHORUS: 4.2 mg/dL (ref 2.5–4.6)
POTASSIUM: 3.3 mmol/L — ABNORMAL LOW (ref 3.5–5.1)
SODIUM: 139 mmol/L (ref 135–145)

## 2024-02-17 LAB — CBC W/ DIFFERENTIAL
BASOPHILS ABSOLUTE COUNT: 0 10*3/uL (ref 0.0–0.1)
BASOPHILS RELATIVE PERCENT: 4 %
EOSINOPHILS ABSOLUTE COUNT: 0.1 10*3/uL (ref 0.0–0.5)
EOSINOPHILS RELATIVE PERCENT: 2 %
HEMATOCRIT: 36.6 (ref 36.0–46.0)
HEMOGLOBIN: 11.5 g/dL — ABNORMAL LOW (ref 12.0–15.0)
IMMATURE CELLS: 0
LYMPHOCYTES ABSOLUTE COUNT: 1.5 10*3/uL (ref 0.7–4.0)
LYMPHOCYTES RELATIVE PERCENT: 33 %
MEAN CORPUSCULAR HEMOGLOBIN CONC: 31.4 g/dL (ref 30.0–36.0)
MEAN CORPUSCULAR HEMOGLOBIN: 27.7 pg (ref 26.0–34.0)
MEAN CORPUSCULAR VOLUME: 88.2 fL (ref 80.0–100.0)
MONOCYTES ABSOLUTE COUNT: 0.5 10*3/uL (ref 0.1–1.0)
MONOCYTES RELATIVE PERCENT: 10 %
NEUTROPHILS RELATIVE PERCENT: 54 %
NUCLEATED RED BLOOD CELLS: 0 (ref 0.0–0.2)
PLATELET COUNT: 210 10*3/uL (ref 150–400)
RED BLOOD CELL COUNT: 4.15 MIL/uL (ref 3.87–5.11)
RED CELL DISTRIBUTION WIDTH: 13.5 % (ref 11.5–15.5)
WHITE BLOOD CELL COUNT: 4.7 10*3/uL (ref 4.0–10.5)

## 2024-02-18 LAB — CMV DNA, QUANTITATIVE, PCR
CMV DNA Quant: POSITIVE [IU]/mL
Log10 CMV Qn DNA Pl: UNDETERMINED {Log_IU}/mL

## 2024-02-19 LAB — TACROLIMUS LEVEL: Tacrolimus (FK506) - LabCorp: 5.8 ng/mL (ref 5.0–20.0)

## 2024-02-24 LAB — CMV DNA, QUANTITATIVE, PCR
CMV COMMENT: DETECTED
CMV QUANT LOG(10): UNDETERMINED
CMV QUANT: POSITIVE

## 2024-02-24 LAB — TACROLIMUS LEVEL, TROUGH: TACROLIMUS, TROUGH: 5.8 ng/mL (ref 5.0–20.0)

## 2024-02-25 NOTE — Unmapped (Signed)
 Called pt yesterday and no answer. Called again today- no answer. VM left and let pt know that she does NOT need to take valcyte  at this time. She should repeat labs next month to recheck her CMV levels and should do this consistently for 3 months, per Dr. Abel Abelson

## 2024-03-03 NOTE — Unmapped (Signed)
 [facility] Home Delivery Pharm[facility] Refill Coordination Note    Specialty Medication(s) to be Shipped:   Transplant: Myfortic  360mg  and tacrolimus  0.5mg     Other medication(s) to be shipped: apixaban  2.5 mg Tab (ELIQUIS )losartan  50 MG tablet (COZAAR )      Sophia Davis, DOB: June 18, 1946  Phone: [phone] (home)       All above HIPAA information was verified with patient.     Was a Nurse, learning disability used for this call? No    Completed refill call assessment today to schedule patient's medication shipment from the [facility]  [phone]).  All relevant notes have been reviewed.     Specialty medication(s) and dose(s) confirmed: Regimen is correct and unchanged.   Changes to medications: Sophia Davis reports no changes at this time.  Changes to insurance: No  New side effects reported not previously addressed with a pharmacist or physician: None reported  Questions for the pharmacist: No    Confirmed patient received a Conservation officer, historic buildings and a Surveyor, mining with first shipment. The patient will receive a drug information handout for each medication shipped and additional FDA Medication Guides as required.       DISEASE/MEDICATION-SPECIFIC INFORMATION        N/A    SPECIALTY MEDICATION ADHERENCE     Medication Adherence    Patient reported X missed doses in the last month: 0  Specialty Medication: mycophenolate  360 MG Tbec (MYFORTIC )  Patient is on additional specialty medications: Yes  Additional Specialty Medications: tacrolimus  0.5 MG capsule (PROGRAF )  Patient Reported Additional Medication X Missed Doses in the Last Month: 0  Patient is on more than two specialty medications: No  Adherence tools used: patient uses a pill box to manage medications              Were doses missed due to medication being on hold? No    mycophenolate  360 MG Tbec (MYFORTIC )  : 7 days of medicine on hand   tacrolimus  0.5 MG capsule (PROGRAF )  : 7 days of medicine on hand       REFERRAL TO PHARMACIST     Referral to the pharmacist: Not needed      [facility]     Shipping address confirmed in Epic.     Cost and Payment: Patient has a copay of $6.40. They are aware and have authorized the pharmacy to charge the credit card on file.    Delivery Scheduled: Yes, Expected medication delivery date: 03/06/24.     Medication will be delivered via Same Day Courier to the prescription address in Epic [facility].    Sophia Davis   [facility]  Specialty Technician

## 2024-03-06 MED FILL — ELIQUIS 2.5 MG TABLET: ORAL | 30 days supply | Qty: 60 | Fill #0

## 2024-03-06 MED FILL — MYCOPHENOLATE SODIUM 360 MG TABLET,DELAYED RELEASE: ORAL | 30 days supply | Qty: 60 | Fill #5

## 2024-03-06 MED FILL — TACROLIMUS 0.5 MG CAPSULE, IMMEDIATE-RELEASE: ORAL | 30 days supply | Qty: 150 | Fill #5

## 2024-03-22 ENCOUNTER — Ambulatory Visit: Admit: 2024-03-22 | Discharge: 2024-03-23 | Payer: MEDICARE

## 2024-03-22 DIAGNOSIS — I10 Essential (primary) hypertension: Principal | ICD-10-CM

## 2024-03-22 DIAGNOSIS — N1832 Stage 3b chronic kidney disease: Principal | ICD-10-CM

## 2024-03-22 DIAGNOSIS — Z940 Kidney transplant status: Principal | ICD-10-CM

## 2024-03-22 DIAGNOSIS — C539 Malignant neoplasm of cervix uteri, unspecified: Principal | ICD-10-CM

## 2024-03-22 DIAGNOSIS — E7800 Pure hypercholesterolemia, unspecified: Principal | ICD-10-CM

## 2024-03-22 DIAGNOSIS — I4819 Other persistent atrial fibrillation: Principal | ICD-10-CM

## 2024-03-22 MED ORDER — METOPROLOL TARTRATE 50 MG TABLET
ORAL_TABLET | Freq: Two times a day (BID) | ORAL | 3 refills | 90.00000 days | Status: CP
Start: 2024-03-22 — End: 2025-03-20
  Filled 2024-03-28: qty 180, 90d supply, fill #0

## 2024-03-22 MED ORDER — MAGNESIUM OXIDE 400 MG (241.3 MG MAGNESIUM) TABLET
ORAL_TABLET | Freq: Every day | ORAL | 3 refills | 90.00000 days | Status: CP
Start: 2024-03-22 — End: 2025-03-22
  Filled 2024-03-28: qty 120, 120d supply, fill #0

## 2024-03-22 MED ORDER — POTASSIUM CHLORIDE ER 10 MEQ TABLET,EXTENDED RELEASE(PART/CRYST)
ORAL_TABLET | Freq: Every day | ORAL | 3 refills | 90.00000 days | Status: CP
Start: 2024-03-22 — End: 2025-03-22
  Filled 2024-03-28: qty 90, 90d supply, fill #0

## 2024-03-22 NOTE — Unmapped (Signed)
 [facility] at [facility]     Reason for visit: Follow up    Questions / Concerns that need to be addressed: no    Screening BP- 147/64 HR 109    [facility] BPs (complete if screening BP has a systolic  > 130 or diastolic > 80)  BP#1 147/64 HR 109   BP#2 152/78 HR 100  BP#3 144/69 HR 99    Average BP 14/70 HR 103  (please note this as a comment in vitals)     PTHomeBP     HCDM reviewed and updated in Epic:    We are working to make sure all of our patients??? wishes are updated in Epic and part of that is documenting a Environmental health practitioner for each patient  A Health Care Decision Maker is someone you choose who can make health care decisions for you if you are not able - who would you most want to do this for you????  is already up to date.    HCDM (other legal document): Versa Gore Son - [phone]    [facility] (patient stated preference): Newt Barefoot - Daughter - [phone]    BPAs completed:  PHQ2 and PHQ9    Annual Screenings:   Tobacco

## 2024-03-22 NOTE — Unmapped (Addendum)
 Metoprolol :  take 50mg  in morning and evening. With what you have left of 25mg  pills, take 2 each time. With new refill you will have 50mg  pills.    Potassium: take one pill daily  Magnesium: take one pill daily    I sent all these prescriptions to [facility].

## 2024-03-22 NOTE — Unmapped (Signed)
 Internal Medicine Clinic Visit    Reason for visit: Follow-up     A/P:      1. Essential hypertension (RAF-HCC)    2. Hypercholesterolemia    3. Stage 3b chronic kidney disease    4. Persistent atrial fibrillation      5. Malignant neoplasm of cervix, unspecified site      6. Kidney transplant 05/17/2013        Assessment & Plan  Atrial Fibrillation  Chronic atrial fibrillation with intermittent lower extremity edema. Heart rate frequently exceeds 100 bpm. Increasing metoprolol  expected to control heart rate and reduce tachycardia complications.  - Increase metoprolol  to 50 mg twice daily to control heart rate.  - Monitor for persistent edema and report if unresolved within a few days.    Hypertension  Slightly elevated blood pressure likely due to non-adherence. Usually well-controlled with medication. Goal: systolic BP <130 mmHg.  She notes that her blood pressure is always down in the normal range at home.    Electrolyte Imbalance  Hypokalemia and hypomagnesemia likely due to antihypertensive and kidney transplant medications. Supplementation necessary to prevent complications.  - Prescribe potassium and magnesium supplements.  - Send prescriptions to mail order pharmacy.    Medication Management  Valcyte  discontinued based on May lab results indicating no current need.  - Discontinue Valcyte .    Hypercholesterolemia  Continue atorvastatin .  She has a history of coronary disease.  LDL last year was 36.  Were not checking blood today since she just recently had a blood checked for all of her transplant labs    History of cervical cancer  She had hysterectomy and has had no suggestion of return of cervical cancer.  No additional monitoring needed    Kidney transplant  Hypokalemia and hypomagnesemia and will replace today.  Other electrolytes and Tac levels are all normal    I personally spent 30 minutes face-to-face and non-face-to-face in the care of this patient, which includes all pre, intra, and post visit time on the date of service.    __________________________________________________________    HPI:    History of Present Illness  Sophia Davis is a 78 year old female with atrial fibrillation and a kidney transplant who presents for medication management and follow-up.    She has experienced significant improvement in her energy levels and mood since her granddaughter and son moved out of her home. She describes feeling 'perky' and has been actively cleaning her house and working in her garden. The presence of her family and their pets had been stressful, impacting her mental well-being.    She discusses her medication history, specifically mentioning Valcyte , which she was advised to stop. She had previously taken Valcyte  for three months about a year ago and had questions about its necessity. She also mentions having blood work done in early May, but has not received any follow-up communication regarding the results.    Her blood pressure is slightly elevated today as she has not taken her medication yet. She typically takes her blood pressure medication at home, which helps maintain her blood pressure within normal limits. Her heart rate occasionally reaches 108 bpm at home.    She experiences intermittent swelling in her legs, which she attributes to either her heart condition or her footwear. She is concerned about the swelling but notes it usually resolves on its own.    She has a history of atrial fibrillation and is currently taking 25 mg of metoprolol  twice daily. Her heart  rate can be fast at times, and she is active, engaging in activities like gardening and house cleaning.    She reports not using her albuterol  inhaler and has two unused inhalers at home. No numbness in her feet and her weight is stable at 110 pounds.    __________________________________________________________      Medications:  See below.    __________________________________________________________    Physical Exam:   Vital Signs:  Vitals:    03/22/24 0859   BP: 148/70   BP Site: L Arm   BP Position: Sitting   BP Cuff Size: Medium   Pulse: 70   Resp: 18   Temp: 36.5 ??C (97.7 ??F)   TempSrc: Temporal   SpO2: 99%   Weight: 50.1 kg (110 lb 6.4 oz)   Height: 154.9 cm (5' 0.98)       Gen: Well appearing, NAD  HEENT: No cervical lymphadenopathy, OP clear. No thyromegaly  CV: RRR, no murmurs  Pulm: CTA bilaterally, no crackles or wheezes  GI: abdomen soft, NTND, normal BS. No HSM.  Pulses: 2+ radial  Ext: No edema    Physical Exam  MEASUREMENTS: Weight- 110.  CHEST: Lungs clear to auscultation bilaterally.  CARDIOVASCULAR: Irregularly irregular heart rhythm, no murmurs.  EXTREMITIES: No lower extremity edema.    Results  LABS  Potassium: 3.4 mmol/L (02/2024)  Magnesium: 1.6 mg/dL (16/1096)       Your Medication List            Accurate as of March 22, 2024  3:39 PM. If you have any questions, ask your nurse or doctor.                STOP taking these medications      AEROCHAMBER MV inhaler  Generic drug: inhalational spacing device  Stopped by: Donita Furrow, MD     albuterol  90 mcg/actuation inhaler  Commonly known as: PROVENTIL  HFA;VENTOLIN  HFA  Stopped by: Donita Furrow, MD            START taking these medications      magnesium oxide 400 mg (241.3 mg elemental) tablet  Commonly known as: MAG-OX  Take 1 tablet (400 mg total) by mouth daily.  Started by: Donita Furrow, MD     potassium chloride 10 MEQ ER tablet  Take 1 tablet (10 mEq total) by mouth daily.  Started by: Donita Furrow, MD            CHANGE how you take these medications      metoPROLOL  tartrate 50 MG tablet  Commonly known as: Lopressor   Take 1 tablet (50 mg total) by mouth two (2) times a day.  What changed:   medication strength  how much to take  Changed by: Donita Furrow, MD            CONTINUE taking these medications      amlodipine  5 MG tablet  Commonly known as: NORVASC   Take 1 tablet (5 mg total) by mouth nightly.     atorvastatin  40 MG tablet  Commonly known as: LIPITOR  Take 1 tablet by mouth every evening     cholecalciferol  (vitamin D3 25 mcg (1,000 units)) 1,000 unit (25 mcg) tablet  Take 1 tablet (25 mcg total) by mouth daily.     ELIQUIS  2.5 mg Tab  Generic drug: apixaban   Take 1 tablet (2.5 mg total) by mouth two (2) times a day.     losartan  50 MG tablet  Commonly known as: COZAAR   Take 1 tablet (50 mg total) by mouth two (2) times a day.     mycophenolate  360 MG Tbec  Commonly known as: MYFORTIC   Take 1 tablet (360 mg total) by mouth two (2) times a day.     tacrolimus  0.5 MG capsule  Commonly known as: PROGRAF   Take 3 capsules (1.5 mg total) by mouth in the morning AND 2 capsules (1 mg total) in the evening.     traZODone  50 MG tablet  Commonly known as: DESYREL   Take 1 tablet (50 mg total) by mouth nightly.     triamterene -hydroCHLOROthiazide  37.5-25 mg per tablet  Commonly known as: MAXZIDE -25  Take 1/2 tablet by mouth daily.                This note was created using Abridge.

## 2024-03-24 NOTE — Unmapped (Signed)
 [facility] Specialty and Home Delivery Pharmacy Refill Coordination Note    Specialty Medication(s) to be Shipped:   Transplant: mycophenolic acid 360mg  and tacrolimus  0.5mg     Other medication(s) to be shipped: magnesium  oxide 400mg   Metoprolol   Potassium  Eliquis   Trazodone        Sophia Davis, DOB: [birth year]  Phone: [phone] (home)       All above HIPAA information was verified with patient.     Was a Nurse, learning disability used for this call? No    Completed refill call assessment today to schedule patient's medication shipment from the [facility] and [facility] Delivery Pharmacy  [phone]).  All relevant notes have been reviewed.     Specialty medication(s) and dose(s) confirmed: Regimen is correct and unchanged.   Changes to medications: Remmi reports no changes at this time.  Changes to insurance: No  New side effects reported not previously addressed with a pharmacist or physician: None reported  Questions for the pharmacist: No    Confirmed patient received a Conservation officer, historic buildings and a Surveyor, mining with first shipment. The patient will receive a drug information handout for each medication shipped and additional FDA Medication Guides as required.       DISEASE/MEDICATION-SPECIFIC INFORMATION        N/A    SPECIALTY MEDICATION ADHERENCE     Medication Adherence    Patient reported X missed doses in the last month: 0  Specialty Medication: mycophenolate  360mg   Patient is on additional specialty medications: Yes  Additional Specialty Medications: Tacrolimus  0.5mg   Patient Reported Additional Medication X Missed Doses in the Last Month: 0  Adherence tools used: patient uses a pill box to manage medications              Were doses missed due to medication being on hold? No    Mycophenolate  360mg   : 10 days of medicine on hand   Tacrolimus   0.5mg  : 10 days of medicine on hand       REFERRAL TO PHARMACIST     Referral to the pharmacist: Not needed      [facility]     Shipping address confirmed in Epic.     Cost and Payment: Patient has a copay of $14.38. They are aware and have authorized the pharmacy to charge the credit card on file.    Delivery Scheduled: Yes, Expected medication delivery date: 6/10.     Medication will be delivered via Same Day Courier to the prescription address in Epic WAM.    Gay Katayama   [facility] Specialty and Home Delivery Pharmacy  Specialty Technician

## 2024-03-28 MED FILL — LOSARTAN 50 MG TABLET: ORAL | 30 days supply | Qty: 60 | Fill #4

## 2024-03-28 MED FILL — ELIQUIS 2.5 MG TABLET: ORAL | 30 days supply | Qty: 60 | Fill #1

## 2024-03-28 MED FILL — MYCOPHENOLATE SODIUM 360 MG TABLET,DELAYED RELEASE: ORAL | 30 days supply | Qty: 60 | Fill #6

## 2024-03-28 MED FILL — TRAZODONE 50 MG TABLET: ORAL | 90 days supply | Qty: 90 | Fill #2

## 2024-03-28 MED FILL — TACROLIMUS 0.5 MG CAPSULE, IMMEDIATE-RELEASE: ORAL | 30 days supply | Qty: 150 | Fill #6

## 2024-04-07 DIAGNOSIS — D849 Immunodeficiency, unspecified: Principal | ICD-10-CM

## 2024-04-07 DIAGNOSIS — Z940 Kidney transplant status: Principal | ICD-10-CM

## 2024-04-07 DIAGNOSIS — E559 Vitamin D deficiency, unspecified: Principal | ICD-10-CM

## 2024-04-07 DIAGNOSIS — N186 End stage renal disease: Principal | ICD-10-CM

## 2024-04-07 DIAGNOSIS — R799 Abnormal finding of blood chemistry, unspecified: Principal | ICD-10-CM

## 2024-04-12 ENCOUNTER — Ambulatory Visit: Admit: 2024-04-12 | Discharge: 2024-04-12 | Payer: MEDICARE | Attending: Nephrology | Primary: Nephrology

## 2024-04-12 ENCOUNTER — Ambulatory Visit: Admit: 2024-04-12 | Discharge: 2024-04-12 | Payer: MEDICARE

## 2024-04-12 ENCOUNTER — Encounter: Admit: 2024-04-12 | Discharge: 2024-04-12 | Payer: MEDICARE | Attending: Nephrology | Primary: Nephrology

## 2024-04-12 DIAGNOSIS — N1832 Stage 3b chronic kidney disease (CMS-HCC): Principal | ICD-10-CM

## 2024-04-12 DIAGNOSIS — E559 Vitamin D deficiency, unspecified: Principal | ICD-10-CM

## 2024-04-12 DIAGNOSIS — D849 Immunodeficiency, unspecified: Principal | ICD-10-CM

## 2024-04-12 DIAGNOSIS — Z940 Kidney transplant status: Principal | ICD-10-CM

## 2024-04-12 DIAGNOSIS — R799 Abnormal finding of blood chemistry, unspecified: Principal | ICD-10-CM

## 2024-04-12 DIAGNOSIS — N186 End stage renal disease: Principal | ICD-10-CM

## 2024-04-12 LAB — URINALYSIS WITH MICROSCOPY
BILIRUBIN UA: NEGATIVE
GLUCOSE UA: NEGATIVE
KETONES UA: NEGATIVE
NITRITE UA: NEGATIVE
PH UA: 6 (ref 5.0–9.0)
RBC UA: 7 /HPF — ABNORMAL HIGH (ref <=4)
SPECIFIC GRAVITY UA: 1.012 (ref 1.003–1.030)
SQUAMOUS EPITHELIAL: 2 /HPF (ref 0–5)
TRANSITIONAL EPITHELIAL: 1 /HPF (ref 0–2)
UROBILINOGEN UA: <2
WBC UA: 19 /HPF — ABNORMAL HIGH (ref 0–5)

## 2024-04-12 LAB — PROTEIN / CREATININE RATIO, URINE
CREATININE, URINE: 95.4 mg/dL
PROTEIN URINE: 30 mg/dL
PROTEIN/CREAT RATIO, URINE: 0.314

## 2024-04-12 LAB — HEPATIC FUNCTION PANEL
ALBUMIN: 4.2 g/dL (ref 3.4–5.0)
ALKALINE PHOSPHATASE: 58 U/L (ref 46–116)
ALT (SGPT): 15 U/L (ref 10–49)
AST (SGOT): 18 U/L (ref <=34)
BILIRUBIN DIRECT: 0.3 mg/dL (ref 0.00–0.30)
BILIRUBIN TOTAL: 0.7 mg/dL (ref 0.3–1.2)
PROTEIN TOTAL: 6.9 g/dL (ref 5.7–8.2)

## 2024-04-12 LAB — BASIC METABOLIC PANEL
ANION GAP: 7 mmol/L (ref 5–14)
BLOOD UREA NITROGEN: 26 mg/dL — ABNORMAL HIGH (ref 9–23)
BUN / CREAT RATIO: 17
CALCIUM: 9.2 mg/dL (ref 8.7–10.4)
CHLORIDE: 104 mmol/L (ref 98–107)
CO2: 24.3 mmol/L (ref 20.0–31.0)
CREATININE: 1.53 mg/dL — ABNORMAL HIGH (ref 0.55–1.02)
EGFR CKD-EPI (2021) FEMALE: 34 mL/min/{1.73_m2} — ABNORMAL LOW (ref >=60)
GLUCOSE RANDOM: 96 mg/dL (ref 70–99)
POTASSIUM: 5 mmol/L — ABNORMAL HIGH (ref 3.4–4.8)
SODIUM: 135 mmol/L (ref 135–145)

## 2024-04-12 LAB — CBC W/ AUTO DIFF
BASOPHILS ABSOLUTE COUNT: 0.1 10*9/L (ref 0.0–0.1)
BASOPHILS RELATIVE PERCENT: 0.9 %
EOSINOPHILS ABSOLUTE COUNT: 0.1 10*9/L (ref 0.0–0.5)
EOSINOPHILS RELATIVE PERCENT: 2.2 %
HEMATOCRIT: 36.3 % (ref 34.0–44.0)
HEMOGLOBIN: 11.9 g/dL (ref 11.3–14.9)
LYMPHOCYTES ABSOLUTE COUNT: 1.5 10*9/L (ref 1.1–3.6)
LYMPHOCYTES RELATIVE PERCENT: 24.3 %
MEAN CORPUSCULAR HEMOGLOBIN CONC: 32.9 g/dL (ref 32.0–36.0)
MEAN CORPUSCULAR HEMOGLOBIN: 27.1 pg (ref 25.9–32.4)
MEAN CORPUSCULAR VOLUME: 82.5 fL (ref 77.6–95.7)
MEAN PLATELET VOLUME: 8.3 fL (ref 6.8–10.7)
MONOCYTES ABSOLUTE COUNT: 0.6 10*9/L (ref 0.3–0.8)
MONOCYTES RELATIVE PERCENT: 10.2 %
NEUTROPHILS ABSOLUTE COUNT: 3.9 10*9/L (ref 1.8–7.8)
NEUTROPHILS RELATIVE PERCENT: 62.4 %
PLATELET COUNT: 202 10*9/L (ref 150–450)
RED BLOOD CELL COUNT: 4.41 10*12/L (ref 3.95–5.13)
RED CELL DISTRIBUTION WIDTH: 14.4 % (ref 12.2–15.2)
WBC ADJUSTED: 6.3 10*9/L (ref 3.6–11.2)

## 2024-04-12 LAB — IRON & TIBC
IRON SATURATION: 29 % (ref 20–55)
IRON: 83 ug/dL (ref 50–170)
TOTAL IRON BINDING CAPACITY: 291 ug/dL (ref 250–425)

## 2024-04-12 LAB — PHOSPHORUS: PHOSPHORUS: 3.3 mg/dL (ref 2.4–5.1)

## 2024-04-12 LAB — LIPID PANEL
CHOLESTEROL: 125 mg/dL (ref <200)
HDL CHOLESTEROL: 57 mg/dL (ref >50)
LDL CHOLESTEROL CALCULATED: 60 mg/dL (ref <100)
NON-HDL CHOLESTEROL: 68 mg/dL (ref <130)
TRIGLYCERIDES: 59 mg/dL (ref <150)

## 2024-04-12 LAB — HEMOGLOBIN A1C
ESTIMATED AVERAGE GLUCOSE: 111 mg/dL
HEMOGLOBIN A1C: 5.5 % (ref 4.8–5.6)

## 2024-04-12 LAB — MAGNESIUM: MAGNESIUM: 2.2 mg/dL (ref 1.6–2.6)

## 2024-04-12 LAB — FERRITIN: FERRITIN: 38.2 ng/mL (ref 7.3–270.7)

## 2024-04-12 MED ORDER — LOSARTAN 50 MG TABLET
ORAL_TABLET | Freq: Two times a day (BID) | ORAL | 11 refills | 30.00000 days | Status: CP
Start: 2024-04-12 — End: 2025-04-12
  Filled 2024-04-25: qty 60, 30d supply, fill #0

## 2024-04-12 NOTE — Unmapped (Signed)
 Transplant Coordinator, Clinic Visit   Pt seen today by transplant nephrology for follow up, reviewed medications and symptoms.   Met with pt during clinic visit today. PT doing well. PT was asked to get labs today as she is due for annual labs- she has already taken her tac this am.   Per Dr. Melba- restart losartan   Stop amlodipine     Assessment  BP: 136/61 today in clinic. Pt reports that last week she went to see Dr. [name] and he increased her Metoprolol . She was concerned about her BP so she stopped taking losartan  at that time.   Lightheaded: denies  Headache: denies  Hand tremors: denies  Numbness/tingling: denies  Fevers: denies  Chills/sweats: denies  Shortness of breath: denies  Chest pain or pressure: denies  Palpitations: at times  Abdominal pain: denies  Heart burn: denies  Nausea/vomiting: denies  Diarrhea/constipation: denies  UTI symptoms: denies  Swelling: denies  Good appetite; reports adequate hydration.     Any new medications? denies  Immunosuppressant last taken: this am    Functional Score: 100   Normal no complaints; no evidence of  disease.     I spent a total of 10 minutes with Erminio reviewing medications and symptoms.

## 2024-04-13 LAB — EBV QUANTITATIVE PCR, BLOOD: EBV VIRAL LOAD RESULT: NOT DETECTED

## 2024-04-13 LAB — CMV DNA, QUANTITATIVE, PCR: CMV VIRAL LD: NOT DETECTED

## 2024-04-13 LAB — BK VIRUS QUANTITATIVE PCR, BLOOD: BK BLOOD RESULT: NOT DETECTED

## 2024-04-14 LAB — VITAMIN D 25 HYDROXY: VITAMIN D, TOTAL (25OH): 31.5 ng/mL (ref 20.0–80.0)

## 2024-04-16 LAB — HLA DS POST TRANSPLANT
ANTI-DONOR DRW #1 MFI: 610 MFI
ANTI-DONOR DRW #2 MFI: 38 MFI
ANTI-DONOR HLA-A #1 MFI: 0 MFI
ANTI-DONOR HLA-B #1 MFI: 0 MFI
ANTI-DONOR HLA-B #2 MFI: 0 MFI
ANTI-DONOR HLA-C #1 MFI: 0 MFI
ANTI-DONOR HLA-C #2 MFI: 0 MFI
ANTI-DONOR HLA-DQB #1 MFI: 98 MFI
ANTI-DONOR HLA-DQB #2 MFI: 0 MFI
ANTI-DONOR HLA-DR #1 MFI: 0 MFI
ANTI-DONOR HLA-DR #2 MFI: 0 MFI

## 2024-04-16 LAB — FSAB CLASS 2 ANTIBODY SPECIFICITY
CLASS 2 ANTIBODIES IDENTIFIED: 1:01 {titer}
HLA CL2 AB RESULT: POSITIVE

## 2024-04-16 LAB — FSAB CLASS 1 ANTIBODY SPECIFICITY: HLA CLASS 1 ANTIBODY RESULT: POSITIVE

## 2024-04-17 LAB — TRUGRAF BLOOD GENE EXPRESSION

## 2024-04-18 LAB — VITAMIN D 1,25 DIHYDROXY: VITAMIN D 1,25-DIHYDROXY: 49 pg/mL

## 2024-04-19 NOTE — Unmapped (Addendum)
 [facility] Specialty and Home Delivery Pharmacy Refill Coordination Note    Specialty Medication(s) to be Shipped:   Transplant: mycophenolate  mofetil 360mg  and tacrolimus  0.5mg     Other medication(s) to be shipped: ELIQUIS  2.5 mg Tab (apixaban ) and losartan  50 MG tablet (COZAAR )     Sophia Davis, DOB: [birth year]  Phone: [phone] (home)       All above HIPAA information was verified with patient.     Was a Nurse, learning disability used for this call? No    Completed refill call assessment today to schedule patient's medication shipment from the [facility] and Home Delivery Pharmacy  [phone]).  All relevant notes have been reviewed.     Specialty medication(s) and dose(s) confirmed: Regimen is correct and unchanged.   Changes to medications: No longer taking amlodipine  for now  Changes to insurance: No  New side effects reported not previously addressed with a pharmacist or physician: None reported  Questions for the pharmacist: No    Confirmed patient received a Conservation officer, historic buildings and a Surveyor, mining with first shipment. The patient will receive a drug information handout for each medication shipped and additional FDA Medication Guides as required.       DISEASE/MEDICATION-SPECIFIC INFORMATION        N/A    SPECIALTY MEDICATION ADHERENCE     Medication Adherence    Patient reported X missed doses in the last month: 0  Specialty Medication: mycophenolate  360 MG Tbec (MYFORTIC )  Patient is on additional specialty medications: Yes  Additional Specialty Medications: tacrolimus  0.5 MG capsule (PROGRAF )  Patient Reported Additional Medication X Missed Doses in the Last Month: 0  Patient is on more than two specialty medications: No  Adherence tools used: patient uses a pill box to manage medications              Were doses missed due to medication being on hold? No      tacrolimus  0.5 MG capsule (PROGRAF ): 10 days of medicine on hand     mycophenolate  360 MG Tbec (MYFORTIC ): 10 days of medicine on hand REFERRAL TO PHARMACIST     Referral to the pharmacist: Not needed      [facility]     Shipping address confirmed in Epic.     Cost and Payment: Patient has a copay of $6.40. They are aware and have authorized the pharmacy to charge the credit card on file.    Delivery Scheduled: Yes, Expected medication delivery date: 04/25/2024.     Medication will be delivered via Same Day Courier to the prescription address in Epic WAM.    Tom [facility] Specialty and Home Delivery Pharmacy  Specialty Technician

## 2024-04-20 NOTE — Unmapped (Signed)
 [employer]  transplant nephrology clinic visit    assessment and plan  1. kidney transplant 05/17/2013. baseline creatinine 1.2-1.4 mg/dl. stage iiib chronic kidney disease. no proteinuria. no donor specific hla ab detected.  2. immunosuppression. mycophenolate  360mg  bid. tacrolimus  1.5mg /1mg  am/pm; 12hr lvl 4-7 ng/ml.  3. hypertension.hold amlodipine  d/t edema. +losartan  50mg  bid. blood pressure goal < 130/80 mmhg.  4. cmv viremia. resolved. monitor cmv pcr.  5. health maintenance. influenza '21. pcv13 pneumococcal '20 administered. zoster '19. covid-19 janssen/moderna vaccine '21. mammogram '21. gyn exam '13/post hysterectomy. cystoscopy '14. colonoscopy '20. dermatology '21. kidney ultrasound '22.    history of present illness    Sophia Davis is a 78 year old woman seen in follow up post kidney transplant 05/17/2013.     past medical hx:   1. deceased donor kidney transplant 05/17/2013. mpo-anca+ chronic gn. alemtuzumab induction. baseline creatinine 1.2-1.4 mg/dl.  2. mpo-anca+ vasculitis/granulomatosis with polyangiitis. hx pulmonary hemorrhage. trtmt plasma exchange, iv methylprednisolone, iv/po cyclophosphamide x total 78m '03-'07; rituximab 1gm x2 '07.  3. hypertension   4. coronary artery disease s/p pci/stent '97. nl cards stress '13: lvef > 65%. no myocardial ischemia   5. hyperlipidemia  6. osteoporosis  7. secondary/tertiary hyperparathyroidism    past surgical hx: total abdominal hysterectomy '98. 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 kidney transplant '14. right hand 3rd/4th trigger finger release '16.    allergies: no known drug allergies    medications: tacrolimus  1.5mg /1mg  am/pm, mycophenolate  sodium 360mg  bid, losartan  50mg  bid, amlodipine  2.5mg  q.pm, triamterene /hydrochlorothiazide  37.5mg /25mg  daily, atorvastatin  40mg  daily, aspirin 81mg  daily, vitamin d 1,000u daily, famotidine  20mg  prn, alendronate  70mg  q.week.    soc hx: widowed x3 children. distant smoking hx; none > 40 yrs.    physical exam: t96.1 p57 bp149/55 wt51.3kg bmi21.3. wd/wn woman appropriate affect and mood. nl sclera anicteric. mmm no thrush. neck supple no palpable ln. heart rrr nl s1s2 no m/r/g. lungs clear bilateral. abdomen soft nt/nd. no lower ext edema. msk no synovitis or tophi. skin no rash. neuro alert oriented non focal exam.

## 2024-04-25 MED FILL — ELIQUIS 2.5 MG TABLET: ORAL | 30 days supply | Qty: 60 | Fill #2

## 2024-04-25 MED FILL — MYCOPHENOLATE SODIUM 360 MG TABLET,DELAYED RELEASE: ORAL | 30 days supply | Qty: 60 | Fill #7

## 2024-04-25 MED FILL — TACROLIMUS 0.5 MG CAPSULE, IMMEDIATE-RELEASE: ORAL | 30 days supply | Qty: 150 | Fill #7

## 2024-04-30 MED ORDER — ATORVASTATIN 40 MG TABLET
ORAL_TABLET | Freq: Every evening | ORAL | 2 refills | 90.00000 days
Start: 2024-04-30 — End: 2025-04-30

## 2024-05-01 MED ORDER — ATORVASTATIN 40 MG TABLET
ORAL_TABLET | Freq: Every evening | ORAL | 2 refills | 90.00000 days | Status: CP
Start: 2024-05-01 — End: 2025-05-01
  Filled 2024-07-05: qty 90, 90d supply, fill #0

## 2024-05-16 NOTE — Unmapped (Signed)
 [facility] Specialty and Home Delivery Pharmacy Refill Coordination Note    Specialty Medication(s) to be Shipped:   Transplant: mycophenolate  mofetil 360mg  and tacrolimus  0.5mg     Other medication(s) to be shipped: ELIQUIS  2.5 mg Tab (apixaban ) and losartan  50 MG tablet (COZAAR )     Sophia Davis, DOB: [birth year]  Phone: [phone] (home)       All above HIPAA information was verified with patient.     Was a Nurse, learning disability used for this call? No    Completed refill call assessment today to schedule patient's medication shipment from the [facility] and Home Delivery Pharmacy  [phone]).  All relevant notes have been reviewed.     Specialty medication(s) and dose(s) confirmed: Regimen is correct and unchanged.   Changes to medications: Sophia Davis reports no changes at this time.  Changes to insurance: No  New side effects reported not previously addressed with a pharmacist or physician: None reported  Questions for the pharmacist: No    Confirmed patient received a Conservation officer, historic buildings and a Surveyor, mining with first shipment. The patient will receive a drug information handout for each medication shipped and additional FDA Medication Guides as required.       DISEASE/MEDICATION-SPECIFIC INFORMATION        N/A    SPECIALTY MEDICATION ADHERENCE     Medication Adherence    Patient reported X missed doses in the last month: 0  Specialty Medication: mycophenolate  360 MG Tbec (MYFORTIC )  Patient is on additional specialty medications: Yes  Additional Specialty Medications: tacrolimus  0.5 MG capsule (PROGRAF )  Patient Reported Additional Medication X Missed Doses in the Last Month: 0  Patient is on more than two specialty medications: No  Adherence tools used: patient uses a pill box to manage medications              Were doses missed due to medication being on hold? No    -  mycophenolate  360 MG Tbec (MYFORTIC ): 7-10 days of medicine on hand     tacrolimus  0.5 MG capsule (PROGRAF ): 7-10 days of medicine on hand REFERRAL TO PHARMACIST     Referral to the pharmacist: Not needed      [facility]     Shipping address confirmed in Epic.     Cost and Payment: Patient has a copay of $6.40. They are aware and have authorized the pharmacy to charge the credit card on file.    Delivery Scheduled: Yes, Expected medication delivery date: 05/18/2024.     Medication will be delivered via Same Day Courier to the prescription address in Epic WAM.    Tom [facility] Specialty and Home Delivery Pharmacy  Specialty Technician

## 2024-05-18 MED FILL — ELIQUIS 2.5 MG TABLET: ORAL | 30 days supply | Qty: 60 | Fill #3

## 2024-05-18 MED FILL — MYCOPHENOLATE SODIUM 360 MG TABLET,DELAYED RELEASE: ORAL | 30 days supply | Qty: 60 | Fill #8

## 2024-05-18 MED FILL — TACROLIMUS 0.5 MG CAPSULE, IMMEDIATE-RELEASE: ORAL | 30 days supply | Qty: 150 | Fill #8

## 2024-05-18 MED FILL — LOSARTAN 50 MG TABLET: ORAL | 30 days supply | Qty: 60 | Fill #1

## 2024-05-26 NOTE — Unmapped (Signed)
 UNOS forms

## 2024-06-06 NOTE — Unmapped (Signed)
 [facility] Specialty and Home Delivery Pharmacy Refill Coordination Note    Specialty Medication(s) to be Shipped:   Transplant: mycophenolate  mofetil 360mg  and tacrolimus  0.5mg     Other medication(s) to be shipped: losartan  50mg  and eliquis  2.5mg     Specialty Medications not needed at this time: N/A     Sophia Davis, DOB: [birth year]  Phone: [phone] (home)       All above HIPAA information was verified with patient.     Was a Nurse, learning disability used for this call? No    Completed refill call assessment today to schedule patient's medication shipment from the [facility] and Home Delivery Pharmacy  [phone]).  All relevant notes have been reviewed.     Specialty medication(s) and dose(s) confirmed: Regimen is correct and unchanged.   Changes to medications: [name] reports no changes at this time.  Changes to insurance: No  New side effects reported not previously addressed with a pharmacist or physician: None reported  Questions for the pharmacist: No    Confirmed patient received a Conservation officer, historic buildings and a Surveyor, mining with first shipment. The patient will receive a drug information handout for each medication shipped and additional FDA Medication Guides as required.       DISEASE/MEDICATION-SPECIFIC INFORMATION        N/A    SPECIALTY MEDICATION ADHERENCE     Medication Adherence    Patient reported X missed doses in the last month: 0  Specialty Medication: mycophenolate  360 MG Tbec (MYFORTIC )  Patient is on additional specialty medications: Yes  Additional Specialty Medications: tacrolimus  0.5 MG capsule (PROGRAF )  Patient Reported Additional Medication X Missed Doses in the Last Month: 0  Patient is on more than two specialty medications: No  Adherence tools used: patient uses a pill box to manage medications              Were doses missed due to medication being on hold? No      tacrolimus  0.5 MG capsule (PROGRAF ): 12-14 days of medicine on hand     mycophenolate  360 MG Tbec (MYFORTIC ): 12-14 days of medicine on hand       REFERRAL TO PHARMACIST     Referral to the pharmacist: Not needed      [facility]     Shipping address confirmed in Epic.     Cost and Payment: Patient has a copay of $6.40. They are aware and have authorized the pharmacy to charge the credit card on file.    Delivery Scheduled: Yes, Expected medication delivery date: 06/13/2024.     Medication will be delivered via Same Day Courier to the prescription address in Epic WAM.    Tom [facility] Specialty and Home Delivery Pharmacy  Specialty Technician

## 2024-06-13 MED FILL — ELIQUIS 2.5 MG TABLET: ORAL | 30 days supply | Qty: 60 | Fill #4

## 2024-06-13 MED FILL — LOSARTAN 50 MG TABLET: ORAL | 30 days supply | Qty: 60 | Fill #2

## 2024-06-13 MED FILL — MYCOPHENOLATE SODIUM 360 MG TABLET,DELAYED RELEASE: ORAL | 30 days supply | Qty: 60 | Fill #9

## 2024-06-13 MED FILL — TACROLIMUS 0.5 MG CAPSULE, IMMEDIATE-RELEASE: ORAL | 30 days supply | Qty: 150 | Fill #9

## 2024-07-03 NOTE — Unmapped (Signed)
 [facility] Specialty and Home Delivery Pharmacy Refill Coordination Note    Specialty Medication(s) to be Shipped:   Transplant: mycophenolate  mofetil 360mg  and tacrolimus  0.5mg     Other medication(s) to be shipped: No additional medications requested for fill at this time    Specialty Medications not needed at this time: N/A     Sophia Davis, DOB: 1946-06-19  Phone: [phone] (home)       All above HIPAA information was verified with patient.     Was a Nurse, learning disability used for this call? No    Completed refill call assessment today to schedule patient's medication shipment from the [facility] and Home Delivery Pharmacy  [phone]).  All relevant notes have been reviewed.     Specialty medication(s) and dose(s) confirmed: Regimen is correct and unchanged.   Changes to medications: Sophia Davis reports no changes at this time.  Changes to insurance: No  New side effects reported not previously addressed with a pharmacist or physician: None reported  Questions for the pharmacist: No    Confirmed patient received a Conservation officer, historic buildings and a Surveyor, mining with first shipment. The patient will receive a drug information handout for each medication shipped and additional FDA Medication Guides as required.       DISEASE/MEDICATION-SPECIFIC INFORMATION        N/A    SPECIALTY MEDICATION ADHERENCE     Medication Adherence    Patient reported X missed doses in the last month: 0  Specialty Medication: tacrolimus  0.5 MG capsule (PROGRAF )  Patient is on additional specialty medications: Yes  Additional Specialty Medications: mycophenolate  360 MG Tbec (MYFORTIC )  Patient Reported Additional Medication X Missed Doses in the Last Month: 0  Patient is on more than two specialty medications: No  Adherence tools used: patient uses a pill box to manage medications              Were doses missed due to medication being on hold? No      mycophenolate  360 MG Tbec (MYFORTIC ): 10-12 days of medicine on hand     tacrolimus  0.5 MG capsule (PROGRAF ): 10-12 days of medicine on hand       REFERRAL TO PHARMACIST     Referral to the pharmacist: Not needed      [facility]     Shipping address confirmed in Epic.     Cost and Payment: Patient has a copay of $15.98. They are aware and have authorized the pharmacy to charge the credit card on file.    Delivery Scheduled: Yes, Expected medication delivery date: 07/05/2024.     Medication will be delivered via Same Day Courier to the prescription address in Epic WAM.    Tom [facility] Specialty and Home Delivery Pharmacy  Specialty Technician

## 2024-07-04 ENCOUNTER — Encounter: Admit: 2024-07-04 | Discharge: 2024-07-04 | Payer: MEDICARE

## 2024-07-04 DIAGNOSIS — I4819 Other persistent atrial fibrillation: Principal | ICD-10-CM

## 2024-07-04 DIAGNOSIS — R609 Edema, unspecified: Principal | ICD-10-CM

## 2024-07-04 DIAGNOSIS — Z940 Kidney transplant status: Principal | ICD-10-CM

## 2024-07-04 DIAGNOSIS — I959 Hypotension, unspecified: Principal | ICD-10-CM

## 2024-07-04 DIAGNOSIS — I10 Essential (primary) hypertension: Principal | ICD-10-CM

## 2024-07-04 LAB — MAGNESIUM: MAGNESIUM: 1.7 mg/dL (ref 1.6–2.6)

## 2024-07-04 LAB — BASIC METABOLIC PANEL
ANION GAP: 11 mmol/L (ref 5–14)
BLOOD UREA NITROGEN: 22 mg/dL (ref 9–23)
BUN / CREAT RATIO: 15
CALCIUM: 9.1 mg/dL (ref 8.7–10.4)
CHLORIDE: 102 mmol/L (ref 98–107)
CO2: 26.3 mmol/L (ref 20.0–31.0)
CREATININE: 1.42 mg/dL — ABNORMAL HIGH (ref 0.55–1.02)
EGFR CKD-EPI (2021) FEMALE: 38 mL/min/1.73m2 — ABNORMAL LOW (ref >=60)
GLUCOSE RANDOM: 104 mg/dL (ref 70–179)
POTASSIUM: 3.9 mmol/L (ref 3.4–4.8)
SODIUM: 139 mmol/L (ref 135–145)

## 2024-07-04 LAB — CBC
HEMATOCRIT: 36.1 % (ref 34.0–44.0)
HEMOGLOBIN: 11.8 g/dL (ref 11.3–14.9)
MEAN CORPUSCULAR HEMOGLOBIN CONC: 32.8 g/dL (ref 32.0–36.0)
MEAN CORPUSCULAR HEMOGLOBIN: 27.1 pg (ref 25.9–32.4)
MEAN CORPUSCULAR VOLUME: 82.7 fL (ref 77.6–95.7)
MEAN PLATELET VOLUME: 8.6 fL (ref 6.8–10.7)
PLATELET COUNT: 222 10*9/L (ref 150–450)
RED BLOOD CELL COUNT: 4.36 10*12/L (ref 3.95–5.13)
RED CELL DISTRIBUTION WIDTH: 14.7 % (ref 12.2–15.2)
WBC ADJUSTED: 5.2 10*9/L (ref 3.6–11.2)

## 2024-07-04 MED ORDER — METOPROLOL TARTRATE 50 MG TABLET
ORAL_TABLET | Freq: Two times a day (BID) | ORAL | 3 refills | 90.00000 days | Status: CP
Start: 2024-07-04 — End: 2025-07-02

## 2024-07-04 MED ORDER — TRIAMTERENE 37.5 MG-HYDROCHLOROTHIAZIDE 25 MG TABLET
ORAL_TABLET | Freq: Every day | ORAL | 3 refills | 90.00000 days | Status: CP
Start: 2024-07-04 — End: ?
  Filled 2024-07-05: qty 45, 90d supply, fill #0

## 2024-07-04 NOTE — Unmapped (Cosign Needed)
 [facility]     Reason for visit: Follow up    Questions / Concerns that need to be addressed: No vaccines today       Omron BPs (complete if screening BP has a systolic  > 130 or diastolic > 80)  BP#1 159/60 56  BP#2 167/57 56  BP#3  164/58    AVG 163/58 56  (please note this as a comment in vitals)             HCDM reviewed and updated in Epic:    We are working to make sure all of our patients??? wishes are updated in Epic and part of that is documenting a Environmental health practitioner for each patient  A Health Care Decision Maker is someone you choose who can make health care decisions for you if you are not able - who would you most want to do this for you????  is already up to date.    HCDM (other legal document): Sophia Davis - [phone]    HCDM (patient stated preference): Sophia Davis - [phone]    BPAs completed:  GAD7    __________________________________________________________________________________________    SCREENINGS COMPLETED IN FLOWSHEETS      AUDIT       PHQ2       PHQ9          GAD7    Over the last 2 weeks, how often have you been bothered by the following problems?  Feeling nervous, anxious or on edge: Not at all  Not being able to stop or control worrying: Not at all  Worrying too much about different things: Not at all  Trouble relaxing: Not at all  Being so restless that it is hard to sit still: Not at all  Becoming easily annoyed or irritable: Not at all  Feeling afraid as if something awful might happen: Not at all  GAD-7 Total Score: 0    COPD Assessment       Falls Risk

## 2024-07-04 NOTE — Unmapped (Signed)
 Check blood pressure three times a day  Keep yourself hydrated. Drink plenty of water every day.  Send me your blood pressures readings in a week.

## 2024-07-05 DIAGNOSIS — R001 Bradycardia, unspecified: Principal | ICD-10-CM

## 2024-07-05 LAB — TACROLIMUS LEVEL, TROUGH: TACROLIMUS, TROUGH: 6.2 ng/mL (ref 5.0–15.0)

## 2024-07-05 MED FILL — TRAZODONE 50 MG TABLET: ORAL | 90 days supply | Qty: 90 | Fill #3

## 2024-07-05 MED FILL — TACROLIMUS 0.5 MG CAPSULE, IMMEDIATE-RELEASE: ORAL | 30 days supply | Qty: 150 | Fill #10

## 2024-07-05 MED FILL — MYCOPHENOLATE SODIUM 360 MG TABLET,DELAYED RELEASE: ORAL | 30 days supply | Qty: 60 | Fill #10

## 2024-07-05 MED FILL — LOSARTAN 50 MG TABLET: ORAL | 90 days supply | Qty: 180 | Fill #3

## 2024-07-05 MED FILL — ELIQUIS 2.5 MG TABLET: ORAL | 30 days supply | Qty: 60 | Fill #5

## 2024-07-05 MED FILL — POTASSIUM CHLORIDE ER 10 MEQ TABLET,EXTENDED RELEASE(PART/CRYST): ORAL | 90 days supply | Qty: 90 | Fill #1

## 2024-07-05 NOTE — Unmapped (Signed)
 Addended by: [name] CARMINE on: 07/05/2024 06:13 PM     Modules accepted: Orders

## 2024-07-05 NOTE — Unmapped (Signed)
 Patient advised she is no longer on Metoprolol  (per Dr. Melba) and is currently on Losartan  50 mg BID instead.    Routed to provider for additional guidance on the Metoprolol  at this time.

## 2024-07-05 NOTE — Unmapped (Signed)
 Copied from CRM #[id number]. Topic: Access To Clinicians - Req Clinic Call Back  >> Jul 05, 2024  9:18 AM Evan C wrote:  The PAC has received an incoming clinical call:    Caller name:  ZACARIA POUSSON  Best callback number: [phone]  Relationship to Patient:   Describe the reason for the call: Requesting a returning call to discuss message Dr. [name] left on her voiced mail to stop taking   metoPROLOL  tartrate (LOPRESSOR ) 50 MG tablet      Patient would like to speak with medical staff concerning his message. Patient expresses that they are unclear.

## 2024-07-05 NOTE — Unmapped (Signed)
 Unable to contact patient x 1, No VM set up.      Per PCP, there was no voicemail left for patient to stop any medication.  There was a phone call to discuss changing dose of Metoprolol  to half tablet BID,  instead of full tablet BID and the patient verbalized understanding to that plan.

## 2024-07-11 NOTE — Unmapped (Signed)
 Internal Medicine Clinic Visit    Reason for visit: syncope    A/P:      1. Hypotension, unspecified hypotension type    2. Kidney transplant recipient (HHS-HCC)    3. Essential (primary) hypertension    4. Persistent atrial fibrillation    (CMS-HCC)    5. Bradycardia        Assessment & Plan  Recurrent presyncope with weakness and diaphoresis  Episodes suggest orthostatic hypotension, possibly due to overmedication or dehydration. Discussed fall risk and life alert system.  - Order EKG. Showed sinus bradycardia which may be the cause.  She has been off of betablocker. Electrolytes and cbc ar normal.  - Measure blood pressure three times daily for one week, including during episodes, and record results.  - Maintain adequate hydration, especially when active.  - Continue current blood pressure medications.  - Consider life alert system for safety.    Essential hypertension  Blood pressure elevated; Triamterene -hydrochlorothiazide  not received from pharmacy, contributing to elevation.  - Send prescription for Triamterene -hydrochlorothiazide  to pharmacy.  - Monitor blood pressure at home and report results.  - Ensure adequate medication supply from pharmacy.    Persistent atrial fibrillation  No specific symptoms or management changes discussed. EKG was sinus rhythm today.    Stage 3b chronic kidney disease  No specific symptoms or management changes discussed. Managed with Tacrolimus  and Mycophenolate .  - Continue current medications for kidney transplant management. Check trough levels.    Osteoporosis, receiving Prolia  therapy  Due for Prolia  injection in October. Discussed finding closer administration location.  - Investigate local options for Prolia  injection administration.    Chronic diarrhea  Intermittent episodes managed with over-the-counter medication. May contribute to dehydration and presyncope.  - Continue current management with over-the-counter antidiarrheal medication as needed.      I personally spent 40 minutes face-to-face and non-face-to-face in the care of this patient, which includes all pre, intra, and post visit time on the date of service.    __________________________________________________________    HPI:    History of Present Illness  Sophia Davis is a 78 year old female with hypertension who presents with episodes of dizziness and weakness.    She has been experiencing episodes of dizziness and weakness over the past month, with five to six episodes occurring during this time. These episodes are characterized by a sensation of weakness, particularly in her legs, and a feeling of impending syncope. She describes an incident at a grocery store where she felt weak and had to hold onto a table to prevent falling. Another episode occurred at a flea market where she needed assistance to sit down. During these episodes, she often experiences diaphoresis and sometimes feels nauseated. Sitting down helps alleviate the symptoms.    The episodes do not occur daily and have not happened in the past week. They are not associated with any loss of consciousness, speech difficulties, or loss of function in her arms or legs. She has noticed that her heart sometimes races during these episodes, but she is usually focused on finding a place to sit down. Her blood pressure has been high for several months, with a reading of 172/unknown during one episode.    Her granddaughter notes that her dietary intake is poor, often consisting of small portions and snacks like chips and pistachios. Despite this, her weight has remained stable. She experiences occasional diarrhea, about once every two weeks, lasting one to two days, for which she takes over-the-counter medication.  She is currently taking losartan  twice a day and has not been receiving her triamterene -hydrochlorothiazide  as it was not sent to her. She has not taken her blood pressure medication today as she was unsure if blood work would be required.    __________________________________________________________      Medications:  See below.    __________________________________________________________    Physical Exam:   Vital Signs:  Vitals:    07/04/24 0849   BP: 163/58   BP Site: L Arm   BP Position: Sitting   BP Cuff Size: Medium   Pulse: 56   Temp: 36.3 ??C (97.4 ??F)   TempSrc: Temporal   SpO2: 98%   Weight: 49.9 kg (110 lb)   Height: 154.9 cm (5' 1)       Gen: Well appearing, NAD  HEENT: No cervical lymphadenopathy, OP clear. No thyromegaly  CV: RRR, no murmurs  Pulm: CTA bilaterally, no crackles or wheezes  GI: abdomen soft, NTND, normal BS. No HSM.  Pulses: 2+ radial  Ext: No edema    Physical Exam        Results           Your Medication List            Accurate as of July 04, 2024 11:59 PM. If you have any questions, ask your nurse or doctor.                CHANGE how you take these medications      metoPROLOL  tartrate 50 MG tablet  Commonly known as: Lopressor   Take 0.5 tablets (25 mg total) by mouth two (2) times a day.  What changed: how much to take  Changed by: Rogelia Dingwall, MD            CONTINUE taking these medications      atorvastatin  40 MG tablet  Commonly known as: LIPITOR  Take 1 tablet by mouth every evening     cholecalciferol  (vitamin D3 25 mcg (1,000 units)) 1,000 unit (25 mcg) tablet  Take 1 tablet (25 mcg total) by mouth daily.     ELIQUIS  2.5 mg Tab  Generic drug: apixaban   Take 1 tablet (2.5 mg total) by mouth two (2) times a day.     losartan  50 MG tablet  Commonly known as: COZAAR   Take 1 tablet (50 mg total) by mouth two (2) times a day.     magnesium  oxide 400 mg (241.3 mg elemental) tablet  Commonly known as: MAG-OX  Take 1 tablet (400 mg total) by mouth daily.     mycophenolate  360 MG Tbec  Commonly known as: MYFORTIC   Take 1 tablet (360 mg total) by mouth two (2) times a day.     potassium chloride  10 MEQ ER tablet  Take 1 tablet (10 mEq total) by mouth daily.     tacrolimus  0.5 MG capsule  Commonly known as: PROGRAF   Take 3 capsules (1.5 mg total) by mouth in the morning AND 2 capsules (1 mg total) in the evening.     traZODone  50 MG tablet  Commonly known as: DESYREL   Take 1 tablet (50 mg total) by mouth nightly.     triamterene -hydroCHLOROthiazide  37.5-25 mg per tablet  Commonly known as: MAXZIDE -25  Take 1/2 tablet by mouth daily.                This note was created using Abridge.

## 2024-07-11 NOTE — Unmapped (Signed)
 S: PCP requesting nursing contact Pt to confirm Pt should not be taking Metoprolol . PCP ordered Zio Patch and is requesting nursing discuss her coming by clinic for placement or a location closer to [place]. Nurse placed call to Pt who confirms she is not taking Metoprolol . Pt requesting placement of the Zio Patch and Prolia  injection in [place] due to granddaughter's request as she is driving Pt to appointments. Pt reports PCP requested she check BP three times daily x 1 week. Pt provides BP readings from 07/04/2024 through morning of 07/11/2024.    B: Nurse Twiddy spoke to Pt on 07/05/2024 who reports she was taken off of the Metoprolol  by Dr. Melba and is only taking the Losartan  50 mg BID. This nurse placed call to [facility] to inquire about Zio Patch placement (no answer).This nurse sent epic chat to endo to explore options for Prolia  shot in [place]. Endo nurse Tamica placed call to Pt so this call was ended. This nurse placed callback to Pt who then requests a call on her home phone.This nurse placed third call to Pt's home phone with no answer. Nurse placed 4th call and was disconnected. This nurse placed 5th call to patient who reports Endo nurse explained she has to come to [place] for Prolia  shot. Pt opts at this time to get Zio Patch placed at [place] after Endo visit on 07/31/2024 for ease of transportation.     A: Pt has a scheduled visit with Endo at [facility] on 07/31/2024   Patient Home Bps (07/04/2024-07/11/2024 3 readings per day in morning, noon, and evening)  PTHomeBP  The patient???s Average Home Blood Pressure during the last two weeks is : 154.36 / 71.05 based on  readings    R: This nurse scheduled Pt for a nurse visit on Monday, 07/31/2024 at 9:00 AM following Prolia  shot with Endo at 8:30 AM. Pt aware [facility] is located on 5th floor of [place]. Pt is grateful for call and denies any questions or concerns at this time. Pt will await PCP's feedback regarding BP readings.

## 2024-07-11 NOTE — Unmapped (Signed)
Telephone encounter routed to PCP.

## 2024-07-12 NOTE — Unmapped (Signed)
 Nurse placed call to Pt to provide PCP's instructions to remain on current medications.   Pt gives verbal understanding of the above medication instructions  Pt denies any questions or concerns at this time  Nurse confirmed appointment for Zio Patch placement per below appointment details  PCP notified of the above    Future Appointments   Date Time Provider Department Center   07/31/2024  7:50 AM Jackson Bamberger, MD [facility]   07/31/2024  8:30 AM [facility] NURSE [facility]   07/31/2024  9:00 AM [place] SAME DAY [place]  NURSE [facility]   09/26/2024  9:45 AM [place], Rogelia Barter, MD [facility]

## 2024-07-23 MED ORDER — CHOLECALCIFEROL (VITAMIN D3) 25 MCG (1,000 UNIT) TABLET
ORAL_TABLET | Freq: Every day | ORAL | 3 refills | 100.00000 days | Status: CP
Start: 2024-07-23 — End: 2025-07-23
  Filled 2024-07-31: qty 100, 100d supply, fill #0

## 2024-07-25 NOTE — Unmapped (Signed)
 [facility] Refill Coordination Note    Specialty Medication(s) to be Shipped:   Transplant: mycophenolate  mofetil 360mg  and tacrolimus  0.5mg     Other medication(s) to be shipped: eliquis  2.5 and vit D    Specialty Medications not needed at this time: N/A     Sophia Davis, DOB: 03-21-1946  Phone: [phone] (home)       All above HIPAA information was verified with patient.     Was a Nurse, learning disability used for this call? No    Completed refill call assessment today to schedule patient's medication shipment from the [facility]  [phone]).  All relevant notes have been reviewed.     Specialty medication(s) and dose(s) confirmed: Regimen is correct and unchanged.   Changes to medications: Sophia Davis reports no changes at this time.  Changes to insurance: No  New side effects reported not previously addressed with a pharmacist or physician: None reported  Questions for the pharmacist: No    Confirmed patient received a Conservation officer, historic buildings and a Surveyor, mining with first shipment. The patient will receive a drug information handout for each medication shipped and additional FDA Medication Guides as required.       DISEASE/MEDICATION-SPECIFIC INFORMATION        N/A    SPECIALTY MEDICATION ADHERENCE     Medication Adherence    Patient reported X missed doses in the last month: 0  Specialty Medication: tacrolimus  0.5 MG capsule (PROGRAF )  Patient is on additional specialty medications: Yes  Additional Specialty Medications: mycophenolate  360 MG Tbec (MYFORTIC )  Patient Reported Additional Medication X Missed Doses in the Last Month: 0  Patient is on more than two specialty medications: No  Adherence tools used: patient uses a pill box to manage medications              Were doses missed due to medication being on hold? No      mycophenolate  360 MG Tbec (MYFORTIC ): 10-14 days of medicine on hand     tacrolimus  0.5 MG capsule (PROGRAF ): 10-14 days of medicine on hand       REFERRAL TO PHARMACIST     Referral to the pharmacist: Not needed      [facility]     Shipping address confirmed in Epic.     Cost and Payment: Patient has a copay of $7.79. They are aware and have authorized the pharmacy to charge the credit card on file.    Delivery Scheduled: Yes, Expected medication delivery date: 08/01/24.     Medication will be delivered via Same Day Courier to the prescription address in Epic WAM.    [facility]  Specialty Technician

## 2024-07-28 NOTE — Unmapped (Signed)
 Error

## 2024-07-29 DIAGNOSIS — S22060S Wedge compression fracture of T7-T8 vertebra, sequela: Principal | ICD-10-CM

## 2024-07-29 DIAGNOSIS — M810 Age-related osteoporosis without current pathological fracture: Principal | ICD-10-CM

## 2024-07-29 DIAGNOSIS — N1832 Stage 3b chronic kidney disease (CMS-HCC): Principal | ICD-10-CM

## 2024-07-31 ENCOUNTER — Ambulatory Visit: Admit: 2024-07-31 | Discharge: 2024-07-31 | Payer: MEDICARE

## 2024-07-31 DIAGNOSIS — N1832 Stage 3b chronic kidney disease (CMS-HCC): Principal | ICD-10-CM

## 2024-07-31 DIAGNOSIS — M810 Age-related osteoporosis without current pathological fracture: Principal | ICD-10-CM

## 2024-07-31 DIAGNOSIS — R001 Bradycardia, unspecified: Principal | ICD-10-CM

## 2024-07-31 DIAGNOSIS — S22060S Wedge compression fracture of T7-T8 vertebra, sequela: Principal | ICD-10-CM

## 2024-07-31 MED ADMIN — denosumab (PROLIA) injection 60 mg: 60 mg | SUBCUTANEOUS | @ 12:00:00 | Stop: 2024-07-31

## 2024-07-31 MED FILL — TACROLIMUS 0.5 MG CAPSULE, IMMEDIATE-RELEASE: ORAL | 30 days supply | Qty: 150 | Fill #11

## 2024-07-31 MED FILL — ELIQUIS 2.5 MG TABLET: ORAL | 30 days supply | Qty: 60 | Fill #6

## 2024-07-31 MED FILL — MYCOPHENOLATE SODIUM 360 MG TABLET,DELAYED RELEASE: ORAL | 30 days supply | Qty: 60 | Fill #11

## 2024-07-31 NOTE — Unmapped (Signed)
 Look up Rollene Lunger  Exercises for osteoporsis

## 2024-07-31 NOTE — Unmapped (Unsigned)
.  Order for: EXTERNAL ECG-48 HR to 21 DAY confirmed and released in Epic. After prepping the skin with razor, abrader and cleaning the area with alcohol, a Zio XT, serial #: [id number], exp. 11/04/2024, was placed per manufacturer instructions after the skin dried completely; pressure and warmth were held to the patch adhesive pads for 2 minutes. The patient was given the Zio XT instructional handout and taught re: how long to wear the patch, how to use the diary, how to trigger event flags for symptoms, do and don'ts of wearing the patch and how to return the product in the provided packaging; the patient stated and demonstrated understanding. The serial number and patient information were registered with the [facility] website.    *Drop hook-up charge: 93242 for 2 to 7 days, or, [phone] for 8 to 14 days.

## 2024-07-31 NOTE — Unmapped (Signed)
 Nurse visit today for Prolia  injection. 2 identifiers and allergies verified. Labs checked. Discussed importance of vitamin D and Calcium supplementation. Prolia  60mg  given [place] in right upper arm. See MAR for medication administration details. Next appointment scheduled with patient for 6 months from now. Prolia  contract explained and signed by patient. Copy of contract given to patient.

## 2024-07-31 NOTE — Unmapped (Signed)
 Endocrinology Clinic Visit Note    ASSESSMENT AND PLAN:     [name] is a 78 y.o. female with a history significant for osteoporosis, thyroid nodule and compression T7 fracture who is seen in consultation today at the request of Sophia Davis for evaluation of osteoporosis.    Osteoporosis  T7 compression fracture  Risk factors for osteoporosis include age, sex, postmenopause and steroid use (mpo-anca/kidney transplant). She was started on Prolia  and received first dose on 01/2024. She had been treated with Fosamax  for 5 years (stopped on 07/2023)  Continue with Prolia  every 6 months  Given kidney function, will likely continue Prolia  for life.  Would like to receive care closer to home as able.  Discussed exercise and provided links to [employer].  I will discuss with PCP about potentially receiving care closer to home. Otherwise will try to coordinate Prolia  schedule with Doctor DeWalt and Doctor Melba.    Thyroid nodule  Thyroid US  showed R thyroid nodule of 0.5cm TI-RADS 4 and isthmus nodule of 1.6cm which was TI-RADS 2 (differential was thyroglossal duct cyst with internal debris)  Repeat thyroid US  in 12/2024    Patient Instructions   Look up Rollene Lunger  Exercises for osteoporsis  Return in about 6 months (around 01/29/2025).    Stanton JONELLE Sage, MD     SUBJECTIVE:     History of Present Illness:  Sophia Davis is a 78 y.o. female who is seen in consultation today at the request of Sophia Davis for evaluation of osteoporosis.    Briefly, this is a 78 y.o. female with history of CKD and osteoporosis who is following up for her history of osteoporosis.  Patient had been on Fosamax  for 5 years (stopped taking it roughly October/2024). She was noted to have a T7 compression factor on imaging and given she had been on bisphosphonate for 5 years with a T7 compression fracture, she was transitioned to Prolia .    Today's visit:  History of Present Illness  Sophia Davis is accompanied by her granddaughter, Rosina. She is here for her scheduled Prolia  shot, which she receives every six months for osteoporosis. She started this treatment six months ago after her previous oral medication was deemed ineffective. She has received one Prolia  shot so far.    She has a history of a kidney transplant and is under the care of Dr. Melba for her kidney function, which is monitored regularly.    She resides in the [place] area and is considering options to receive her Prolia  shots closer to home to avoid long travel times. She currently coordinates her medical appointments to minimize travel, seeing her nephrologist and other specialists on the same day when possible.    In terms of physical activity, she engages in light exercise such as walking in her yard. She does not use hand weights but is encouraged to use household items like cans for resistance exercises.      Medical History Surgical History   Past Medical History[1]   Past Surgical History[2]       Social History Family History   Social History     Tobacco Use    Smoking status: Never    Smokeless tobacco: Never   Substance Use Topics    Alcohol use: Not Currently      Family History[3]       Medications   Current Medications[4]         Allergies   Allergies[5]  OBJECTIVE:     Physical Exam:  BP 122/64 (BP Position: Sitting)  - Pulse 65  - Resp 14  - Ht 154.9 cm (5' 0.98)  - Wt 50.6 kg (111 lb 9.6 oz)  - LMP  (LMP Unknown)  - SpO2 99%  - BMI 21.10 kg/m??     Wt Readings from Last 10 Encounters:   07/31/24 50.6 kg (111 lb 9.6 oz)   07/04/24 49.9 kg (110 lb)   04/12/24 51 kg (112 lb 6.4 oz)   03/22/24 50.1 kg (110 lb 6.4 oz)   02/02/24 49.4 kg (109 lb)   01/24/24 50 kg (110 lb 3.2 oz)   12/22/23 49.8 kg (109 lb 12.8 oz)   11/22/23 47.6 kg (105 lb)   11/03/23 47.2 kg (104 lb)   10/05/23 47.6 kg (105 lb)       Constitutional - alert, well appearing, no acute distress  Resp - normal work of breathing  MSK - no obvious joint deformity, sit to stand without hands without issue. No pain down spine. Moves with ease.  Neurological - Mental status - alert, oriented  Psych - normal affect, pleasant and engaged    Labs:  I personally reviewed labs available in Epic prior to the start of today's visit.      Lab Results   Component Value Date    A1C 5.5 04/12/2024    A1C 5.6 03/05/2023    A1C 5.2 02/25/2022    A1C 5.0 03/21/2021    A1C 5.4 10/02/2020     Lab Results   Component Value Date    TRIG 59 04/12/2024    CHOL 125 04/12/2024    HDL 57 04/12/2024    LDL 60 04/12/2024     Lab Results   Component Value Date    TSH 2.116 12/22/2023    FREET4 1.04 12/22/2023     Lab Results   Component Value Date    CREATININE 1.42 (H) 07/04/2024    GFRNAAF 46 (L) 03/16/2023     Lab Results   Component Value Date    MALBCRERAT 737.9 (H) 05/07/2003     No results found for: CPEPTIDE  Office Visit on 07/04/2024   Component Date Value    Sodium 07/04/2024 139     Potassium 07/04/2024 3.9     Chloride 07/04/2024 102     CO2 07/04/2024 26.3     Anion Gap 07/04/2024 11     BUN 07/04/2024 22     Creatinine 07/04/2024 1.42 (H)     BUN/Creatinine Ratio 07/04/2024 15     eGFR CKD-EPI (2021) Fema* 07/04/2024 38 (L)     Glucose 07/04/2024 104     Calcium 07/04/2024 9.1     Magnesium  07/04/2024 1.7     EKG Ventricular Rate 07/04/2024 48     EKG Atrial Rate 07/04/2024 48     EKG P-R Interval 07/04/2024 180     EKG QRS Duration 07/04/2024 88     EKG Q-T Interval 07/04/2024 496     EKG QTC Calculation 07/04/2024 443     EKG Calculated P Axis 07/04/2024 78     EKG Calculated R Axis 07/04/2024 -2     EKG Calculated T Axis 07/04/2024 63     QTC Fredericia 07/04/2024 460     Tacrolimus , Trough 07/04/2024 6.2     WBC 07/04/2024 5.2     RBC 07/04/2024 4.36     HGB 07/04/2024 11.8     HCT 07/04/2024 36.1  MCV 07/04/2024 82.7     MCH 07/04/2024 27.1     MCHC 07/04/2024 32.8     RDW 07/04/2024 14.7     MPV 07/04/2024 8.6     Platelet 07/04/2024 222    Appointment on 04/12/2024   Component Date Value    Magnesium  04/12/2024 2.2     CMV Viral Ld 04/12/2024 Not Detected     BK Blood Result 04/12/2024 Not Detected     Hemoglobin A1C 04/12/2024 5.5     Estimated Average Glucose 04/12/2024 111     Iron 04/12/2024 83     TIBC 04/12/2024 291     Iron Saturation (%) 04/12/2024 29     Ferritin 04/12/2024 38.2     Donor ID 04/12/2024 [id number]     Donor HLA-A Antigen #1 04/12/2024 A2     Anti-Donor HLA-A #1 MFI 04/12/2024 0     Donor HLA-B Antigen #1 04/12/2024 B7     Anti-Donor HLA-B #1 MFI 04/12/2024 0     Donor HLA-B Antigen #2 04/12/2024 B44     Anti-Donor HLA-B #2 MFI 04/12/2024 0     Donor HLA-C Antigen #1 04/12/2024 C5     Anti-Donor HLA-C #1 MFI 04/12/2024 0     Donor HLA-C Antigen #2 04/12/2024 C7     Anti-Donor HLA-C #2 MFI 04/12/2024 0     Donor HLA-DR Antigen #1 04/12/2024 DR9     Anti-Donor HLA-DR #1 MFI 04/12/2024 0     Donor HLA-DR Antigen #2 04/12/2024 DR12     Anti-Donor HLA-DR #2 MFI 04/12/2024 0     Donor DRw Antigen #1 04/12/2024 DR53     Anti-Donor DRw #1 MFI 04/12/2024 610     Donor DRw Antigen #2 04/12/2024 DR52     Anti-Donor DRw #2 MFI 04/12/2024 38     Donor HLA-DQB Antigen #1 04/12/2024 DQ9     Anti-Donor HLA-DQB #1 MFI 04/12/2024 98     Donor HLA-DQB Antigen #2 04/12/2024 DQ7     Anti-Donor HLA-DQB #2 MFI 04/12/2024 0     DSA Comment 04/12/2024 Unable to determine DP DSA status due to unavailability of donor DP HLA type.     HLA Class 1 Antibody Res* 04/12/2024 Positive     HLA Class 1 Antibodies I* 04/12/2024 A:1, 23, 24:02  B:76     HLA Class 1 Antibody Com* 04/12/2024      HLA Class 2 Antibody Res* 04/12/2024 Positive     HLA Class 2 Antibodies I* 04/12/2024 DQ:2, 5, 6  DQA1:01:01, 01:02, 01:03, 05:01  DPB1:1     HLA Class 2 Antibody Com* 04/12/2024      Albumin 04/12/2024 4.2     Total Protein 04/12/2024 6.9     Total Bilirubin 04/12/2024 0.7     Bilirubin, Direct 04/12/2024 0.30     AST 04/12/2024 18     ALT 04/12/2024 15     Alkaline Phosphatase 04/12/2024 58     Cholesterol, Total 04/12/2024 125     Cholesterol, HDL 04/12/2024 57     Cholesterol, LDL, Calcul* 04/12/2024 60     Cholesterol, Non-HDL, Ca* 04/12/2024 68     Triglycerides 04/12/2024 59     Fasting 04/12/2024 Yes     Vitamin D Total (25OH) 04/12/2024 31.5     Vit D, 1,25-Dihydroxy 04/12/2024 49     EBV Viral Load Result 04/12/2024 Not Detected     WBC 04/12/2024 6.3     RBC 04/12/2024 4.41     HGB  04/12/2024 11.9     HCT 04/12/2024 36.3     MCV 04/12/2024 82.5     MCH 04/12/2024 27.1     MCHC 04/12/2024 32.9     RDW 04/12/2024 14.4     MPV 04/12/2024 8.3     Platelet 04/12/2024 202     Neutrophils % 04/12/2024 62.4     Lymphocytes % 04/12/2024 24.3     Monocytes % 04/12/2024 10.2     Eosinophils % 04/12/2024 2.2     Basophils % 04/12/2024 0.9     Absolute Neutrophils 04/12/2024 3.9     Absolute Lymphocytes 04/12/2024 1.5     Absolute Monocytes 04/12/2024 0.6     Absolute Eosinophils 04/12/2024 0.1     Absolute Basophils 04/12/2024 0.1     Sodium 04/12/2024 135     Potassium 04/12/2024 5.0 (H)     Chloride 04/12/2024 104     CO2 04/12/2024 24.3     Anion Gap 04/12/2024 7     BUN 04/12/2024 26 (H)     Creatinine 04/12/2024 1.53 (H)     BUN/Creatinine Ratio 04/12/2024 17     eGFR CKD-EPI (2021) Fema* 04/12/2024 34 (L)     Glucose 04/12/2024 96     Calcium 04/12/2024 9.2     Phosphorus 04/12/2024 3.3    Office Visit on 04/12/2024   Component Date Value    Spec Gravity/POC 04/12/2024 1.015     PH/POC 04/12/2024 6.0     Leuk Esterase/POC 04/12/2024 2+ (A)     Nitrite/POC 04/12/2024 Negative     Protein/POC 04/12/2024 1+ (A)     UA Glucose/POC 04/12/2024 Negative     Ketones, POC 04/12/2024 Negative     Bilirubin/POC 04/12/2024 Negative     Blood/POC 04/12/2024 Trace-lysed (A)     Urobilinogen/POC 04/12/2024 0.2     Color, UA 04/12/2024 Light Yellow     Clarity, UA 04/12/2024 Turbid     Specific Gravity, UA 04/12/2024 1.012     pH, UA 04/12/2024 6.0     Leukocyte Esterase, UA 04/12/2024 Moderate (A)     Nitrite, UA 04/12/2024 Negative Protein, UA 04/12/2024 Trace (A)     Glucose, UA 04/12/2024 Negative     Ketones, UA 04/12/2024 Negative     Urobilinogen, UA 04/12/2024 <2.0 mg/dL     Bilirubin, UA 93/74/7974 Negative     Blood, UA 04/12/2024 Trace (A)     RBC, UA 04/12/2024 7 (H)     WBC, UA 04/12/2024 19 (H)     Squam Epithel, UA 04/12/2024 2     Bacteria, UA 04/12/2024 Rare (A)     Trans Epithel, UA 04/12/2024 1     Mucus, UA 04/12/2024 Rare (A)     Urine Culture, Comprehen* 04/12/2024 Mixed Gram Positive/Gram Negative Organisms Isolated (A)     Creat U 04/12/2024 95.4     Protein, Ur 04/12/2024 30.0     Protein/Creatinine Ratio* 04/12/2024 0.314     Trugraf Blood Gene Expre* 06/05/2022 TX     Trugraf Blood Gene Expre* 09/03/2022 TX    Documentation on 02/17/2024   Component Date Value    CMV Quant 02/17/2024 Positive < 200     CMV Comment 02/17/2024 CMV DNA detected.     CMV Quant Log(10) 02/17/2024 UNABLE TO CALCULATE    Documentation on 02/17/2024   Component Date Value    Tacrolimus , Trough 02/17/2024 5.8    Documentation on 02/17/2024   Component Date Value    WBC 02/17/2024 4.7  RBC 02/17/2024 4.15     HGB 02/17/2024 11.5 (L)     HCT 02/17/2024 36.6     MCV 02/17/2024 88.2     MCH 02/17/2024 27.7     MCHC 02/17/2024 31.4     RDW 02/17/2024 13.5     Platelet 02/17/2024 210     nRBC 02/17/2024 0.0     Neutrophils % 02/17/2024 54     Lymphocytes % 02/17/2024 33     Absolute Lymphocytes 02/17/2024 1.5     Monocytes % 02/17/2024 10     Absolute Monocytes 02/17/2024 0.5     Eosinophils % 02/17/2024 2     Absolute Eosinophils 02/17/2024 0.1     Basophils % 02/17/2024 4     Absolute Basophils 02/17/2024 0.0     Immature Cells 02/17/2024 0    Documentation on 02/17/2024   Component Date Value    Sodium 02/17/2024 139     Potassium 02/17/2024 3.3 (L)     Chloride 02/17/2024 106     CO2 02/17/2024 26     Glucose 02/17/2024 98     BUN 02/17/2024 19     Creatinine 02/17/2024 1.42 (H)     Calcium 02/17/2024 9.0     EGFR CKD-EPI African Ame* 02/17/2024 38 (L)     Anion Gap 02/17/2024 7     Magnesium  02/17/2024 1.4 (L)     Phosphorus 02/17/2024 4.2      Lab Results   Component Value Date    NA 139 07/04/2024    K 3.9 07/04/2024    CL 102 07/04/2024    CO2 26.3 07/04/2024    BUN 22 07/04/2024    CREATININE 1.42 (H) 07/04/2024    GLUCOSE 149 05/22/2013    GFR 15.99 (L) 12/13/2012    AST 18 04/12/2024    PROT 6.9 04/12/2024    ALBUMIN 4.2 04/12/2024     No results found for: FSH, LH  No results found for: ESTRADIOL  No results found for: TESTOSTERONE  No results found for: PROLACTIN  No components found for: ILGF1  No results found for: CORTISOL  No components found for: ALDOSTERON  Lab Results   Component Value Date    PTH 68.5 12/22/2023    PTH 80.3 (H) 08/18/2023    PTH 54.6 02/25/2022     No results found for: CA  Lab Results   Component Value Date    ALBUMIN 4.2 04/12/2024    ALBUMIN 4.0 11/03/2023    ALBUMIN 3.5 08/18/2023     Lab Results   Component Value Date    PHOS 3.3 04/12/2024    PHOS 4.2 02/17/2024    PHOS 4.6 08/18/2023     No components found for: VITD26           [1]   Past Medical History:  Diagnosis Date    Coronary artery disease     Essential hypertension 09/28/2003    Granulomatosis with Polyangiitis 12/17/2005    Immunosuppression ([facility]HS-HCC) 07/22/2015   [2]   Past Surgical History:  Procedure Laterality Date    CARDIAC SURGERY      stents    HYSTERECTOMY  1998    NEPHRECTOMY TRANSPLANTED ORGAN      OOPHORECTOMY  1998    PR COLONOSCOPY FLX DX W/COLLJ SPEC WHEN PFRMD N/A 12/30/2018    Procedure: COLONOSCOPY, FLEXIBLE, PROXIMAL TO SPLENIC FLEXURE; DIAGNOSTIC, W/WO COLLECTION SPECIMEN BY BRUSH OR WASH;  Surgeon: Lorrene Georgi Rase, MD;  Location: [facility] [place];  Service: Gastroenterology    PR EXPLOR POSTOP BLEED,INFEC,CLOT-ABD Right 05/22/2013    Procedure: EXPLOR POSTOP HEMORR THROMBOSIS/INFEC; ABD;  Surgeon: Alm DELENA Police, MD;  Location: MAIN OR [place];  Service: Transplant    PR TRANSPLANT,PREP CADAVER RENAL GRAFT N/A 05/17/2013    Procedure: BACKBNCH STD PREP CAD DONR RENAL ALLOGFT PRIOR TO TRNSPLNT, INCL DISSEC/REM PERINEPH FAT, DIAPH/RTPER ATTAC;  Surgeon: Marsa VEAR Boss, MD;  Location: MAIN OR [place];  Service: Transplant    PR TRANSPLANTATION OF KIDNEY Right 05/17/2013    Procedure: RENAL ALLOTRANSPLANTATION, IMPLANTATION OF GRAFT; WITHOUT RECIPIENT NEPHRECTOMY;  Surgeon: Marsa VEAR Boss, MD;  Location: MAIN OR ;  Service: Transplant    SKIN BIOPSY      TRIGGER FINGER RELEASE Right 2016    middle and ring fingers   [3]   Family History  Problem Relation Age of Onset    Hypertension Mother     Heart disease Father     Heart attack Father     Heart disease Sister     Hypertension Sister     Emphysema Brother     No Known Problems Son     Hypertension Sister     Hypertension Son     No Known Problems Son     Breast cancer Neg Hx     Colon cancer Neg Hx     Ovarian cancer Neg Hx     Endometrial cancer Neg Hx     Melanoma Neg Hx     Basal cell carcinoma Neg Hx     Squamous cell carcinoma Neg Hx    [4]   Current Outpatient Medications:     apixaban  (ELIQUIS ) 2.5 mg Tab, Take 1 tablet (2.5 mg total) by mouth two (2) times a day., Disp: 60 tablet, Rfl: 6    atorvastatin  (LIPITOR) 40 MG tablet, Take 1 tablet by mouth every evening, Disp: 90 tablet, Rfl: 2    cholecalciferol , vitamin D3 25 mcg, 1,000 units,, 1,000 unit (25 mcg) tablet, Take 1 tablet (25 mcg total) by mouth daily., Disp: 100 tablet, Rfl: 3    losartan  (COZAAR ) 50 MG tablet, Take 1 tablet (50 mg total) by mouth two (2) times a day., Disp: 60 tablet, Rfl: 11    magnesium  oxide (MAG-OX) 400 mg (241.3 mg elemental magnesium ) tablet, Take 1 tablet (400 mg total) by mouth daily., Disp: 90 tablet, Rfl: 3    metoPROLOL  tartrate (LOPRESSOR ) 50 MG tablet, Take 0.5 tablets (25 mg total) by mouth two (2) times a day., Disp: 90 tablet, Rfl: 3    mycophenolate  (MYFORTIC ) 360 MG TbEC, Take 1 tablet (360 mg total) by mouth two (2) times a day., Disp: 60 tablet, Rfl: 11    potassium chloride  10 MEQ ER tablet, Take 1 tablet (10 mEq total) by mouth daily., Disp: 90 tablet, Rfl: 3    tacrolimus  (PROGRAF ) 0.5 MG capsule, Take 3 capsules (1.5 mg total) by mouth in the morning AND 2 capsules (1 mg total) in the evening., Disp: 150 capsule, Rfl: 11    traZODone  (DESYREL ) 50 MG tablet, Take 1 tablet (50 mg total) by mouth nightly., Disp: 90 tablet, Rfl: 3    triamterene -hydroCHLOROthiazide  (MAXZIDE -25) 37.5-25 mg per tablet, Take 1/2 tablet by mouth daily., Disp: 45 tablet, Rfl: 3  No current facility-administered medications for this visit.  [5] No Known Allergies

## 2024-08-22 DIAGNOSIS — D849 Immunodeficiency, unspecified: Principal | ICD-10-CM

## 2024-08-22 DIAGNOSIS — Z940 Kidney transplant status: Principal | ICD-10-CM

## 2024-08-22 MED ORDER — TACROLIMUS 0.5 MG CAPSULE, IMMEDIATE-RELEASE
ORAL_CAPSULE | ORAL | 11 refills | 30.00000 days | Status: CP
Start: 2024-08-22 — End: ?
  Filled 2024-09-08: qty 150, 30d supply, fill #0

## 2024-08-22 MED ORDER — MYCOPHENOLATE SODIUM 360 MG TABLET,DELAYED RELEASE
ORAL_TABLET | Freq: Two times a day (BID) | ORAL | 11 refills | 30.00000 days | Status: CP
Start: 2024-08-22 — End: ?
  Filled 2024-09-08: qty 60, 30d supply, fill #0

## 2024-08-22 NOTE — Progress Notes (Signed)
 Sophia Davis has been contacted in regards to their refill of mycophenolate  360 MG Tbec (MYFORTIC )  and tacrolimus  0.5 MG capsule (PROGRAF ). At this time, they have declined refill due to pt having more than 3 weeks. Refill assessment call date has been updated per the patient's request.

## 2024-09-05 DIAGNOSIS — I4819 Other persistent atrial fibrillation: Principal | ICD-10-CM

## 2024-09-05 MED ORDER — APIXABAN 2.5 MG TABLET
ORAL_TABLET | Freq: Two times a day (BID) | ORAL | 3 refills | 90.00000 days | Status: CP
Start: 2024-09-05 — End: ?
  Filled 2024-09-08: qty 180, 90d supply, fill #0

## 2024-09-05 NOTE — Progress Notes (Signed)
 [facility] Refill Coordination Note    Specialty Medication(s) to be Shipped:   Transplant: mycophenolate  mofetil 360mg  and tacrolimus  0.5mg     Other medication(s) to be shipped: eliquis  2.5mg     Specialty Medications not needed at this time: N/A     Sophia Davis, DOB: 1946-04-12  Phone: [phone] (home)       All above HIPAA information was verified with patient.     Was a nurse, learning disability used for this call? No    Completed refill call assessment today to schedule patient's medication shipment from the [facility]  [phone]).  All relevant notes have been reviewed.     Specialty medication(s) and dose(s) confirmed: Regimen is correct and unchanged.   Changes to medications: [name] reports no changes at this time.  Changes to insurance: No  New side effects reported not previously addressed with a pharmacist or physician: None reported  Questions for the pharmacist: No    Confirmed patient received a Conservation Officer, Historic Buildings and a Surveyor, Mining with first shipment. The patient will receive a drug information handout for each medication shipped and additional FDA Medication Guides as required.       DISEASE/MEDICATION-SPECIFIC INFORMATION        N/A    SPECIALTY MEDICATION ADHERENCE     Medication Adherence    Patient reported X missed doses in the last month: 0  Specialty Medication: tacrolimus  0.5 MG capsule (PROGRAF )  Patient is on additional specialty medications: Yes  Additional Specialty Medications: mycophenolate  360 MG Tbec (MYFORTIC )  Patient Reported Additional Medication X Missed Doses in the Last Month: 0  Patient is on more than two specialty medications: No  Adherence tools used: patient uses a pill box to manage medications              Were doses missed due to medication being on hold? No      mycophenolate  360 MG Tbec (MYFORTIC ): 5-7 days of medicine on hand     tacrolimus  0.5 MG capsule (PROGRAF ): 5-7 days of medicine on hand REFERRAL TO PHARMACIST     Referral to the pharmacist: Not needed      [facility]     Shipping address confirmed in Epic.     Cost and Payment: Patient has a copay of $4.80. They are aware and have authorized the pharmacy to charge the credit card on file.    Delivery Scheduled: Yes, Expected medication delivery date: 09/08/24.  However, Rx request for refills was sent to the provider as there are none remaining.     Medication will be delivered via Same Day Courier to the prescription address in Epic WAM.    [facility]  Specialty Technician

## 2024-09-29 MED ORDER — TRAZODONE 50 MG TABLET
ORAL_TABLET | Freq: Every evening | ORAL | 3 refills | 90.00000 days
Start: 2024-09-29 — End: ?

## 2024-09-29 NOTE — Progress Notes (Signed)
 [facility] Refill Coordination Note    Specialty Medication(s) to be Shipped:   Transplant: mycophenolate  mofetil 360mg  and tacrolimus  0.5mg     Other medication(s) to be shipped: Potassium,Losartan ,Potassium,Trazodone ,triamterene benson    Specialty Medications not needed at this time: N/A     Sophia Davis, DOB: 18-Feb-1946  Phone: [phone] (home)       All above HIPAA information was verified with patient.     Was a nurse, learning disability used for this call? No    Completed refill call assessment today to schedule patient's medication shipment from the [facility]  [phone]).  All relevant notes have been reviewed.     Specialty medication(s) and dose(s) confirmed: Regimen is correct and unchanged.   Changes to medications: [name] reports no changes at this time.  Changes to insurance: No  New side effects reported not previously addressed with a pharmacist or physician: None reported  Questions for the pharmacist: No    Confirmed patient received a Conservation Officer, Historic Buildings and a Surveyor, Mining with first shipment. The patient will receive a drug information handout for each medication shipped and additional FDA Medication Guides as required.       DISEASE/MEDICATION-SPECIFIC INFORMATION        N/A    SPECIALTY MEDICATION ADHERENCE     Medication Adherence    Patient reported X missed doses in the last month: 0  Specialty Medication: mycophenolate  360 MG Tbec (MYFORTIC )  Patient is on additional specialty medications: Yes  Additional Specialty Medications: tacrolimus  0.5 MG capsule (PROGRAF )  Patient Reported Additional Medication X Missed Doses in the Last Month: 0  Patient is on more than two specialty medications: No  Any gaps in refill history greater than 2 weeks in the last 3 months: no  Demonstrates understanding of importance of adherence: yes  Informant: patient  Reliability of informant: reliable  Provider-estimated medication adherence level: good  Patient is at risk for Non-Adherence: No  Reasons for non-adherence: no problems identified  Adherence tools used: patient uses a pill box to manage medications  Confirmed plan for next specialty medication refill: delivery by pharmacy  Refills needed for supportive medications: not needed          Refill Coordination    Has the Patients' Contact Information Changed: No  Is the Shipping Address Different: No         Were doses missed due to medication being on hold? No    Mycophenolate  360   mg: 7 days of medicine on hand   Tacrolimus  0.5 mg: 7 days of medicine on hand     medication is an injection or given on a cycle: No    REFERRAL TO PHARMACIST     Referral to the pharmacist: Not needed      [facility]     Shipping address confirmed in Epic.     Cost and Payment: Patient has a $0 copay, payment information is not required.    Delivery Scheduled: Yes, Expected medication delivery date: 12/17.     Medication will be delivered via Next Day Courier to the prescription address in Epic WAM.    Sophia Davis   [facility]  Specialty Technician

## 2024-10-02 MED ORDER — TRAZODONE 50 MG TABLET
ORAL_TABLET | Freq: Every evening | ORAL | 3 refills | 90.00000 days | Status: CP
Start: 2024-10-02 — End: ?
  Filled 2024-10-03: qty 90, 90d supply, fill #0

## 2024-10-03 MED FILL — TACROLIMUS 0.5 MG CAPSULE, IMMEDIATE-RELEASE: ORAL | 30 days supply | Qty: 150 | Fill #1

## 2024-10-03 MED FILL — POTASSIUM CHLORIDE ER 10 MEQ TABLET,EXTENDED RELEASE(PART/CRYST): ORAL | 90 days supply | Qty: 90 | Fill #2

## 2024-10-03 MED FILL — MYCOPHENOLATE SODIUM 360 MG TABLET,DELAYED RELEASE: ORAL | 30 days supply | Qty: 60 | Fill #1

## 2024-10-03 MED FILL — TRIAMTERENE 37.5 MG-HYDROCHLOROTHIAZIDE 25 MG TABLET: ORAL | 90 days supply | Qty: 45 | Fill #1

## 2024-10-03 MED FILL — ATORVASTATIN 40 MG TABLET: ORAL | 90 days supply | Qty: 90 | Fill #1

## 2024-10-03 MED FILL — LOSARTAN 50 MG TABLET: ORAL | 90 days supply | Qty: 180 | Fill #4

## 2024-10-13 DIAGNOSIS — D849 Immunodeficiency, unspecified: Principal | ICD-10-CM

## 2024-10-13 DIAGNOSIS — N186 End stage renal disease: Principal | ICD-10-CM

## 2024-10-13 DIAGNOSIS — Z940 Kidney transplant status: Principal | ICD-10-CM

## 2024-10-16 ENCOUNTER — Ambulatory Visit: Admit: 2024-10-16 | Discharge: 2024-10-17 | Payer: MEDICARE | Attending: Nephrology | Primary: Nephrology

## 2024-10-16 DIAGNOSIS — D849 Immunodeficiency, unspecified: Principal | ICD-10-CM

## 2024-10-16 DIAGNOSIS — Z940 Kidney transplant status: Principal | ICD-10-CM

## 2024-10-16 DIAGNOSIS — N186 End stage renal disease: Principal | ICD-10-CM

## 2024-10-16 LAB — CMV DNA, QUANTITATIVE, PCR
CMV QUANT: <35 [IU]/mL — ABNORMAL HIGH (ref <0)
CMV VIRAL LD: DETECTED — AB

## 2024-10-16 LAB — URINALYSIS WITH MICROSCOPY
BACTERIA: NONE SEEN /HPF
BILIRUBIN UA: NEGATIVE
GLUCOSE UA: NEGATIVE
KETONES UA: NEGATIVE
NITRITE UA: NEGATIVE
PH UA: 5.5 (ref 5.0–9.0)
PROTEIN UA: 100 — AB
RBC UA: 3 /HPF (ref <=4)
SPECIFIC GRAVITY UA: 1.012 (ref 1.003–1.030)
SQUAMOUS EPITHELIAL: 1 /HPF (ref 0–5)
UROBILINOGEN UA: <2
WBC UA: 12 /HPF — ABNORMAL HIGH (ref 0–5)

## 2024-10-16 LAB — RENAL FUNCTION PANEL
ALBUMIN: 3.8 g/dL (ref 3.4–5.0)
ANION GAP: 12 mmol/L (ref 5–14)
BLOOD UREA NITROGEN: 17 mg/dL (ref 9–23)
BUN / CREAT RATIO: 13
CALCIUM: 8.8 mg/dL (ref 8.7–10.4)
CHLORIDE: 101 mmol/L (ref 98–107)
CO2: 21.6 mmol/L (ref 20.0–31.0)
CREATININE: 1.29 mg/dL — ABNORMAL HIGH (ref 0.55–1.02)
EGFR CKD-EPI (2021) FEMALE: 42 mL/min/1.73m2 — ABNORMAL LOW (ref >=60)
GLUCOSE RANDOM: 89 mg/dL (ref 70–99)
PHOSPHORUS: 2.6 mg/dL (ref 2.4–5.1)
POTASSIUM: 4.3 mmol/L (ref 3.4–4.8)
SODIUM: 135 mmol/L (ref 135–145)

## 2024-10-16 LAB — BK VIRUS QUANTITATIVE PCR, BLOOD: BK BLOOD RESULT: NOT DETECTED

## 2024-10-16 LAB — CBC W/ AUTO DIFF
BASOPHILS ABSOLUTE COUNT: 0 10*9/L (ref 0.0–0.1)
BASOPHILS RELATIVE PERCENT: 0.8 %
EOSINOPHILS ABSOLUTE COUNT: 0.1 10*9/L (ref 0.0–0.5)
EOSINOPHILS RELATIVE PERCENT: 2 %
HEMATOCRIT: 32.9 % — ABNORMAL LOW (ref 34.0–44.0)
HEMOGLOBIN: 10.7 g/dL — ABNORMAL LOW (ref 11.3–14.9)
LYMPHOCYTES ABSOLUTE COUNT: 1 10*9/L — ABNORMAL LOW (ref 1.1–3.6)
LYMPHOCYTES RELATIVE PERCENT: 19.5 %
MEAN CORPUSCULAR HEMOGLOBIN CONC: 32.6 g/dL (ref 32.0–36.0)
MEAN CORPUSCULAR HEMOGLOBIN: 27.4 pg (ref 25.9–32.4)
MEAN CORPUSCULAR VOLUME: 84.2 fL (ref 77.6–95.7)
MEAN PLATELET VOLUME: 8.7 fL (ref 6.8–10.7)
MONOCYTES ABSOLUTE COUNT: 0.5 10*9/L (ref 0.3–0.8)
MONOCYTES RELATIVE PERCENT: 8.9 %
NEUTROPHILS ABSOLUTE COUNT: 3.7 10*9/L (ref 1.8–7.8)
NEUTROPHILS RELATIVE PERCENT: 68.8 %
PLATELET COUNT: 197 10*9/L (ref 150–450)
RED BLOOD CELL COUNT: 3.91 10*12/L — ABNORMAL LOW (ref 3.95–5.13)
RED CELL DISTRIBUTION WIDTH: 15.4 % — ABNORMAL HIGH (ref 12.2–15.2)
WBC ADJUSTED: 5.4 10*9/L (ref 3.6–11.2)

## 2024-10-16 LAB — PROTEIN / CREATININE RATIO, URINE
CREATININE, URINE: 74.9 mg/dL
PROTEIN URINE: 115.1 mg/dL
PROTEIN/CREAT RATIO, URINE: 1.537

## 2024-10-16 LAB — MAGNESIUM: MAGNESIUM: 1.8 mg/dL (ref 1.6–2.6)

## 2024-10-16 LAB — TACROLIMUS LEVEL, TROUGH: TACROLIMUS, TROUGH: 4.9 ng/mL — ABNORMAL LOW (ref 5.0–15.0)

## 2024-10-16 NOTE — Progress Notes (Signed)
 Transplant Coordinator, Clinic Visit   Pt seen today by transplant nephrology for follow up, reviewed medications and symptoms.   Met with pt during clinic visit today. Pt doing well. Pt will get labs in clinic today- she last took tac at 2100 last night    Assessment  BP: 142/55 today in clinic. Pt reports home BP's are 140-160/50-60. Pt unsure of BP meds she is taking at home. She is going to call [facility] when she gets home to review meds.  Lightheaded: denies  Headache: denies  Hand tremors: denies  Numbness/tingling: denies  Fevers: denies  Chills/sweats: denies  Shortness of breath: denies  Chest pain or pressure: denies  Palpitations: denies  Abdominal pain: denies  Heart burn: denies  Nausea/vomiting: denies  Diarrhea/constipation: on and off diarrhea  UTI symptoms: denies  Swelling: denies    Good appetite; reports adequate hydration.     Any new medications? denies  Immunosuppressant last taken: last night at 2100    Immunization status: pt declines vaccines    I spent a total of 10 minutes with Sophia Davis reviewing medications and symptoms.

## 2024-10-24 MED ORDER — EMPAGLIFLOZIN 10 MG TABLET
ORAL_TABLET | Freq: Every day | ORAL | 11 refills | 30.00000 days | Status: CP
Start: 2024-10-24 — End: ?

## 2024-10-24 NOTE — Progress Notes (Signed)
 [facility]  Transplant Nephrology Clinic Visit    assessment and plan:  1. allograft function, status post kidney transplant:  Lab Results   Component Value Date    Creatinine 1.29 (H) 10/16/2024    Creatinine 1.42 (H) 07/04/2024    Creatinine 1.53 (H) 04/12/2024   - baseline creatinine 1.2-1.4 mg/dl  - stage iiib chronic kidney disease  - empagliflozin  10mg  daily recommended vs albuminuria    2. immunosuppression management [Yphy Risk Medical Decision Making For Drug Therapy Requiring Intensive Monitoring For Toxicity]:  Lab Results   Component Value Date    Tacrolimus , Trough 4.9 (L) 10/16/2024    Tacrolimus , Trough 6.2 07/04/2024    Tacrolimus , Trough 5.8 02/17/2024   - tacrolimus  1.5mg /1mg  am/pm; 12hr goal 4-7 ng/ml.  - mycophenolate  360mg  bid    3. 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 hypertension:  - losartan  50mg  bid  - triamterene /hydrochlorothiazide  37.5mg /25mg  x1/2 tablet daily; potential increase to full tablet for improved blood pressure mgmt if sglt2 inh not accessible.  - blood pressure goal < 130/80 mmhg    5. hyperlipidemia:  Lab Results   Component Value Date    Cholesterol, Total 125 04/12/2024    Cholesterol, HDL 57 04/12/2024    Cholesterol, LDL, Calculated 60 04/12/2024    Triglycerides 59 04/12/2024   - atorvastatin  40mg  daily    6. atrial fibrillation:  - potential restart reduced dose metoprolol  x 12.5-25mg  xl daily d/t high burden svt and hx coronary artery disease. will discuss with primary care physician.  - apixaban  2.5mg  bid anticoagulation    Follow-Up:  Return in about 6 months (around 04/16/2025).    patient will continue to follow-up with her primary care provider for non-transplant related issues and medication refills. transplant specific labs ordered per the center's guidelines to monitor and assess for toxicities from immunosuppressant drug therapy.    History of Present Illness    [name] is a 79 year old woman here for follow up after kidney transplant 05/17/2013.     subjective/interval 10/16/2024 :     zio patch vs palpitations 08/25/24 noted high burden supraventricular tachycardia; metoprolol  stopped 9/25 d/t symptomatic bradycardia. upper respiratory tract infection symptoms last month with non prod cough now resolved.    she feels well. energy lvl good. appetite fair. weight stable/past 68m. no headache tremor or lightheaded. no chest pain cough or shortness of breath. no orthopnea. +intermittent lower extremity dependent edema. no n/v/d. no dysuria hematuria or difficulty voiding. blood pressure 140-160s/50-60s.    Transplant Background  transplant date: 05/17/2013 (Kidney)  native kidney disease:Other, Specify - ANCA VASCULITIS;   induction therapy: alemtuzumab  early steroid withdrawal: Yes  KDPI: 19%  cold ischemic time: 724 minutes; warm ischemic time: 76 minutes  blood type: Donor A1, Recipient A POS  infectious disease: CMV D Positive/R Positive, EBV D Positive/R Positive, HCV donor Ab, NAT Negative  dialysis vintage: prior to transplant the patient was not on dialysis   post-transplant course:   - baseline creatinine 1.2-1.4 mg/dl. +albuminuria  - no donor specific hla ab detected  - no hx transplant rejection.     past medical hx:  1. deceased donor kidney transplant 05/17/2013. mpo anca+ chr gn. alemtuzumab induction. baseline creatinine 1.2-1.4 mg/dl.  2. mpo anca+ vasculitis/granulomatosis with polyangiitis with hx pulmonary hemorrhage. plasma exchange, iv methylprednisolone, iv/po cyclophosphamide x total 7m '03-'07, rituximab 1gm x2 '07.  3. hypertension  4. coronary artery disease s/p pci/stent '97.  5. hyperlipidemia  6. atrial fibrillation  7. osteoporosis  8. secondary/tertiary hyperparathyroidism    past surgical hx: total abdominal hysterectomy '98. 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 kidney transplant '14. right hand 3rd/4th trigger finger release '16.    medications  Current Medications[1]  _______________________________________________________    review of systems otherwise as per HPI, all other systems reviewed and are negative.    Physical Exam  BP 142/55 (BP Site: L Arm, BP Position: Sitting, BP Cuff Size: Medium)  - Pulse 63  - Temp 36.2 ??C (97.1 ??F) (Temporal)  - Ht 154.9 cm (5' 0.98)  - Wt 50.8 kg (112 lb)  - BMI 21.18 kg/m??   General: wd wn no acute distress  HEENT: mmm no thrush  Neck: neck supple, no cervical lymphadenopathy  CV: normal rate and rhythm, no murmur, rubs or gallops  Lungs: clear to auscultation bilateral  Abdomen: soft, non tender non distended  Extremities: no edema  Musculoskeletal: normal range of motion.  Neurologic: awake, alert, and oriented x3    Laboratory Results and Imaging in Epic reviewed    Lytle Riding, MD  Transplant Nephrology  [facility] Division of Nephrology and Hypertension  10/16/2024             [1]   Current Outpatient Medications   Medication Sig Dispense Refill    apixaban  (ELIQUIS ) 2.5 mg Tab Take 1 tablet (2.5 mg total) by mouth two (2) times a day. 180 tablet 3    atorvastatin  (LIPITOR) 40 MG tablet Take 1 tablet by mouth every evening 90 tablet 2    cholecalciferol , vitamin D3 25 mcg, 1,000 units,, 1,000 unit (25 mcg) tablet Take 1 tablet (25 mcg total) by mouth daily. 100 tablet 3    losartan  (COZAAR ) 50 MG tablet Take 1 tablet (50 mg total) by mouth two (2) times a day. 60 tablet 11    magnesium  oxide (MAG-OX) 400 mg (241.3 mg elemental magnesium ) tablet Take 1 tablet (400 mg total) by mouth daily. 90 tablet 3    metoPROLOL  tartrate (LOPRESSOR ) 50 MG tablet Take 0.5 tablets (25 mg total) by mouth two (2) times a day. (Patient taking differently: Take 0.5 tablets (25 mg total) by mouth daily.) 90 tablet 3    mycophenolate  (MYFORTIC ) 360 MG TbEC Take 1 tablet (360 mg total) by mouth two (2) times a day. 60 tablet 11    potassium chloride  10 MEQ ER tablet Take 1 tablet (10 mEq total) by mouth daily. 90 tablet 3 tacrolimus  (PROGRAF ) 0.5 MG capsule Take 3 capsules (1.5 mg total) by mouth in the morning AND 2 capsules (1 mg total) in the evening. 150 capsule 11    traZODone  (DESYREL ) 50 MG tablet Take 1 tablet (50 mg total) by mouth nightly. (Patient taking differently: Take 1 tablet (50 mg total) by mouth nightly as needed for sleep.) 90 tablet 3    triamterene -hydroCHLOROthiazide  (MAXZIDE -25) 37.5-25 mg per tablet Take 1/2 tablet by mouth daily. (Patient not taking: Reported on 10/16/2024) 45 tablet 3     No current facility-administered medications for this visit.

## 2024-10-25 DIAGNOSIS — Z940 Kidney transplant status: Principal | ICD-10-CM

## 2024-10-25 NOTE — Telephone Encounter (Signed)
 Dermatology referral placed

## 2024-10-28 NOTE — Progress Notes (Signed)
 [facility] Refill Coordination Note    Specialty Medication(s) to be Shipped:   Transplant: mycophenolate  mofetil 360 mg and tacrolimus  0.5 mg    Other medication(s) to be shipped: vitd , magnesium  , metoprolol     Specialty Medications not needed at this time: N/A     Sophia Davis, DOB: 1946/08/18  Phone: [phone] (home)       All above HIPAA information was verified with patient.     Was a nurse, learning disability used for this call? No    Completed refill call assessment today to schedule patient's medication shipment from the [facility]  [phone]).  All relevant notes have been reviewed.     Specialty medication(s) and dose(s) confirmed: Regimen is correct and unchanged.   Changes to medications: Female reports no changes at this time.  Changes to insurance: No  New side effects reported not previously addressed with a pharmacist or physician: None reported  Questions for the pharmacist: No    Confirmed patient received a Conservation Officer, Historic Buildings and a Surveyor, Mining with first shipment. The patient will receive a drug information handout for each medication shipped and additional FDA Medication Guides as required.       DISEASE/MEDICATION-SPECIFIC INFORMATION        N/A    SPECIALTY MEDICATION ADHERENCE     Medication Adherence    Patient reported X missed doses in the last month: 0  Specialty Medication: tacrolimus  0.5 MG capsule (PROGRAF )  Patient is on additional specialty medications: Yes  Additional Specialty Medications: mycophenolate  360 MG Tbec (MYFORTIC )  Patient Reported Additional Medication X Missed Doses in the Last Month: 0  Patient is on more than two specialty medications: No  Adherence tools used: patient uses a pill box to manage medications              Were doses missed due to medication being on hold? No    mycophenolate  360 MG Tbec (MYFORTIC )  10 days of medicine on hand   tacrolimus  0.5 MG capsule (PROGRAF )  10 days of medicine on hand       Specialty medication is an injection or given on a cycle: No    REFERRAL TO PHARMACIST     Referral to the pharmacist: Not needed      [facility]     Shipping address confirmed in Epic.     Cost and Payment: Patient has a copay of $7.78. They are aware and have authorized the pharmacy to charge the credit card on file.    Delivery Scheduled: Yes, Expected medication delivery date: 11/02/2024.     Medication will be delivered via Next Day Courier to the prescription address in Epic WAM.    [facility]  Specialty Technician

## 2024-10-31 NOTE — Telephone Encounter (Signed)
 Pt called and said that she called [facility] and they have not gotten referral    [facility] called [facility]- they just need a fax with pt's name and DOB sent to [phone] for referral.    This was done and pt notified

## 2024-10-31 NOTE — Telephone Encounter (Signed)
 Rosina,  Thank you for sending this.  If her heart rate was in the 150s, then I think that is the problem. Her heart is getting going too fast, and it makes her lightheaded.  The best treatment for this is to take an extra dose of metoprolol  when she notices that her heart is beating more than 100 times a minute.  Has she noticed that her heart is beating fast much of the time?  Looking in her chart, I wonder if she is taking the metoprolol  at all.    It would be good for her to come in to be seen.  I could see her next Tuesday morning if you call the clinic.    Timm Bonenberger    From: [name]   Sent: Tuesday, October 31, 2024 2:56 AM  To: [name], [name]  Subject: Sophia Davis    Wanted to make you aware that she had another of the dizzy spell on Sunday. Her BP at the time of it was very high(135/118 or something like that she said) and her HR in the 150's.    We brought her to our house for the night to watch her and all seemed ok and is still OK.

## 2024-11-01 MED FILL — TACROLIMUS 0.5 MG CAPSULE, IMMEDIATE-RELEASE: ORAL | 90 days supply | Qty: 450 | Fill #2

## 2024-11-01 MED FILL — MYCOPHENOLATE SODIUM 360 MG TABLET,DELAYED RELEASE: ORAL | 90 days supply | Qty: 180 | Fill #2

## 2024-11-01 MED FILL — MAGNESIUM OXIDE 400 MG (241.3 MG MAGNESIUM) TABLET: ORAL | 120 days supply | Qty: 120 | Fill #1

## 2024-11-01 MED FILL — CHOLECALCIFEROL (VITAMIN D3) 25 MCG (1,000 UNIT) TABLET: ORAL | 100 days supply | Qty: 100 | Fill #1

## 2024-11-01 MED FILL — METOPROLOL TARTRATE 50 MG TABLET: ORAL | 90 days supply | Qty: 90 | Fill #0

## 2024-11-01 NOTE — Progress Notes (Signed)
 1/14: received notification from triage team that patient was able to get 90ds on both specialty meds she gets from [facility] (tac/myco) on below order. Next refill call date adjusted accordingly -ef

## 2024-11-01 NOTE — Telephone Encounter (Signed)
 Copied from CRM #[id number]. Topic: Scheduling - New Appointment  >> Nov 01, 2024 11:24 AM Janina SAUNDERS wrote:  The [facility] has received an incoming clinical call:    Caller name: Ashely   Best callback number: [phone]   Relationship to Patient: grandchild  Describe the reason for the call: patient requested an appointment for next Tuesday per your request .  You are not showing any available ,please advise
